# Patient Record
Sex: Male | Born: 1970 | Race: White | Hispanic: No | Marital: Married | Smoking: Former smoker
Health system: Southern US, Community
[De-identification: ages and names within clinical notes are randomized; demographics above are authoritative.]

## PROBLEM LIST (undated history)

## (undated) DIAGNOSIS — F1010 Alcohol abuse, uncomplicated: Secondary | ICD-10-CM

## (undated) DIAGNOSIS — I351 Nonrheumatic aortic (valve) insufficiency: Secondary | ICD-10-CM

## (undated) DIAGNOSIS — I5020 Unspecified systolic (congestive) heart failure: Secondary | ICD-10-CM

## (undated) DIAGNOSIS — Z9889 Other specified postprocedural states: Secondary | ICD-10-CM

## (undated) DIAGNOSIS — I4892 Unspecified atrial flutter: Secondary | ICD-10-CM

## (undated) DIAGNOSIS — R011 Cardiac murmur, unspecified: Secondary | ICD-10-CM

## (undated) DIAGNOSIS — I428 Other cardiomyopathies: Secondary | ICD-10-CM

## (undated) DIAGNOSIS — I499 Cardiac arrhythmia, unspecified: Secondary | ICD-10-CM

## (undated) DIAGNOSIS — I480 Paroxysmal atrial fibrillation: Secondary | ICD-10-CM

## (undated) DIAGNOSIS — Z8679 Personal history of other diseases of the circulatory system: Secondary | ICD-10-CM

## (undated) DIAGNOSIS — Z954 Presence of other heart-valve replacement: Secondary | ICD-10-CM

## (undated) HISTORY — PX: CARDIAC CATHETERIZATION: SHX172

## (undated) HISTORY — PX: NO PAST SURGERIES: SHX2092

---

## 2013-07-21 HISTORY — PX: AORTIC VALVE REPLACEMENT: SHX41

## 2013-07-28 ENCOUNTER — Encounter (HOSPITAL_COMMUNITY): Payer: Self-pay | Admitting: Emergency Medicine

## 2013-07-28 ENCOUNTER — Emergency Department (HOSPITAL_COMMUNITY): Payer: Self-pay

## 2013-07-28 ENCOUNTER — Inpatient Hospital Stay (HOSPITAL_COMMUNITY)
Admission: EM | Admit: 2013-07-28 | Discharge: 2013-08-01 | DRG: 291 | Disposition: A | Discharge status: Home / Self Care | Payer: Self-pay | Attending: Family Medicine | Admitting: Family Medicine

## 2013-07-28 DIAGNOSIS — I509 Heart failure, unspecified: Secondary | ICD-10-CM | POA: Diagnosis present

## 2013-07-28 DIAGNOSIS — R0989 Other specified symptoms and signs involving the circulatory and respiratory systems: Secondary | ICD-10-CM

## 2013-07-28 DIAGNOSIS — Z8249 Family history of ischemic heart disease and other diseases of the circulatory system: Secondary | ICD-10-CM

## 2013-07-28 DIAGNOSIS — I351 Nonrheumatic aortic (valve) insufficiency: Secondary | ICD-10-CM

## 2013-07-28 DIAGNOSIS — R599 Enlarged lymph nodes, unspecified: Secondary | ICD-10-CM | POA: Diagnosis present

## 2013-07-28 DIAGNOSIS — Z87891 Personal history of nicotine dependence: Secondary | ICD-10-CM

## 2013-07-28 DIAGNOSIS — I059 Rheumatic mitral valve disease, unspecified: Secondary | ICD-10-CM

## 2013-07-28 DIAGNOSIS — I5021 Acute systolic (congestive) heart failure: Principal | ICD-10-CM | POA: Diagnosis present

## 2013-07-28 DIAGNOSIS — I712 Thoracic aortic aneurysm, without rupture, unspecified: Secondary | ICD-10-CM | POA: Diagnosis present

## 2013-07-28 DIAGNOSIS — I426 Alcoholic cardiomyopathy: Secondary | ICD-10-CM | POA: Diagnosis present

## 2013-07-28 DIAGNOSIS — R000 Tachycardia, unspecified: Secondary | ICD-10-CM | POA: Diagnosis present

## 2013-07-28 DIAGNOSIS — I10 Essential (primary) hypertension: Secondary | ICD-10-CM | POA: Diagnosis present

## 2013-07-28 DIAGNOSIS — F1020 Alcohol dependence, uncomplicated: Secondary | ICD-10-CM | POA: Diagnosis present

## 2013-07-28 DIAGNOSIS — R0600 Dyspnea, unspecified: Secondary | ICD-10-CM

## 2013-07-28 DIAGNOSIS — I4892 Unspecified atrial flutter: Secondary | ICD-10-CM | POA: Diagnosis present

## 2013-07-28 DIAGNOSIS — Q231 Congenital insufficiency of aortic valve: Secondary | ICD-10-CM

## 2013-07-28 DIAGNOSIS — R0609 Other forms of dyspnea: Secondary | ICD-10-CM

## 2013-07-28 DIAGNOSIS — I4891 Unspecified atrial fibrillation: Secondary | ICD-10-CM | POA: Diagnosis not present

## 2013-07-28 DIAGNOSIS — J9600 Acute respiratory failure, unspecified whether with hypoxia or hypercapnia: Secondary | ICD-10-CM | POA: Diagnosis present

## 2013-07-28 HISTORY — DX: Alcohol abuse, uncomplicated: F10.10

## 2013-07-28 HISTORY — DX: Nonrheumatic aortic (valve) insufficiency: I35.1

## 2013-07-28 HISTORY — DX: Unspecified atrial flutter: I48.92

## 2013-07-28 HISTORY — DX: Unspecified systolic (congestive) heart failure: I50.20

## 2013-07-28 HISTORY — DX: Paroxysmal atrial fibrillation: I48.0

## 2013-07-28 LAB — CBC WITH DIFFERENTIAL/PLATELET
Basophils Absolute: 0 10*3/uL (ref 0.0–0.1)
Basophils Relative: 0 % (ref 0–1)
Eosinophils Absolute: 0.1 10*3/uL (ref 0.0–0.7)
Eosinophils Relative: 1 % (ref 0–5)
HCT: 40.1 % (ref 39.0–52.0)
Hemoglobin: 13.3 g/dL (ref 13.0–17.0)
Lymphocytes Relative: 11 % — ABNORMAL LOW (ref 12–46)
Lymphs Abs: 0.9 10*3/uL (ref 0.7–4.0)
MCH: 30.6 pg (ref 26.0–34.0)
MCHC: 33.2 g/dL (ref 30.0–36.0)
MCV: 92.2 fL (ref 78.0–100.0)
Monocytes Absolute: 0.6 10*3/uL (ref 0.1–1.0)
Monocytes Relative: 7 % (ref 3–12)
Neutro Abs: 6.8 10*3/uL (ref 1.7–7.7)
Neutrophils Relative %: 81 % — ABNORMAL HIGH (ref 43–77)
Platelets: 193 10*3/uL (ref 150–400)
RBC: 4.35 MIL/uL (ref 4.22–5.81)
RDW: 13.7 % (ref 11.5–15.5)
WBC: 8.4 10*3/uL (ref 4.0–10.5)

## 2013-07-28 LAB — MAGNESIUM: Magnesium: 2 mg/dL (ref 1.5–2.5)

## 2013-07-28 LAB — RAPID URINE DRUG SCREEN, HOSP PERFORMED
Amphetamines: NOT DETECTED
Barbiturates: NOT DETECTED
Benzodiazepines: NOT DETECTED
Cocaine: NOT DETECTED
Opiates: NOT DETECTED
Tetrahydrocannabinol: NOT DETECTED

## 2013-07-28 LAB — HEPATIC FUNCTION PANEL
ALT: 62 U/L — ABNORMAL HIGH (ref 0–53)
AST: 51 U/L — ABNORMAL HIGH (ref 0–37)
Albumin: 4.1 g/dL (ref 3.5–5.2)
Alkaline Phosphatase: 64 U/L (ref 39–117)
Bilirubin, Direct: 0.3 mg/dL (ref 0.0–0.3)
Indirect Bilirubin: 1.2 mg/dL — ABNORMAL HIGH (ref 0.3–0.9)
Total Bilirubin: 1.5 mg/dL — ABNORMAL HIGH (ref 0.3–1.2)
Total Protein: 6.6 g/dL (ref 6.0–8.3)

## 2013-07-28 LAB — BASIC METABOLIC PANEL
BUN: 17 mg/dL (ref 6–23)
CO2: 24 mEq/L (ref 19–32)
Calcium: 9.3 mg/dL (ref 8.4–10.5)
Chloride: 100 mEq/L (ref 96–112)
Creatinine, Ser: 1.07 mg/dL (ref 0.50–1.35)
GFR calc Af Amer: >90 mL/min (ref >90)
GFR calc non Af Amer: 85 mL/min — ABNORMAL LOW (ref >90)
Glucose, Bld: 124 mg/dL — ABNORMAL HIGH (ref 70–99)
Potassium: 5 mEq/L (ref 3.7–5.3)
Sodium: 138 mEq/L (ref 137–147)

## 2013-07-28 LAB — ETHANOL: Alcohol, Ethyl (B): <11 mg/dL (ref 0–11)

## 2013-07-28 LAB — TROPONIN I
Troponin I: <0.3 ng/mL (ref <0.30)
Troponin I: <0.3 ng/mL (ref <0.30)

## 2013-07-28 LAB — PRO B NATRIURETIC PEPTIDE: Pro B Natriuretic peptide (BNP): 4883 pg/mL — ABNORMAL HIGH (ref 0–125)

## 2013-07-28 MED ORDER — DILTIAZEM HCL 100 MG IV SOLR
5.0000 mg/h | INTRAVENOUS | Status: DC
  Administered 2013-07-28: 5 mg/h via INTRAVENOUS
  Filled 2013-07-28: qty 100

## 2013-07-28 MED ORDER — ENOXAPARIN SODIUM 40 MG/0.4ML ~~LOC~~ SOLN
40.0000 mg | SUBCUTANEOUS | Status: DC
  Administered 2013-07-28: 40 mg via SUBCUTANEOUS
  Filled 2013-07-28: qty 0.4

## 2013-07-28 MED ORDER — ESMOLOL HCL-SODIUM CHLORIDE 2000 MG/100ML IV SOLN: 25.0000 ug/kg/min | INTRAVENOUS | Status: DC

## 2013-07-28 MED ORDER — FUROSEMIDE 10 MG/ML IJ SOLN
40.0000 mg | Freq: Two times a day (BID) | INTRAMUSCULAR | Status: DC
  Administered 2013-07-28: 40 mg via INTRAVENOUS
  Filled 2013-07-28 (×3): qty 4

## 2013-07-28 MED ORDER — LORAZEPAM 2 MG/ML IJ SOLN
1.0000 mg | Freq: Once | INTRAMUSCULAR | Status: AC
  Administered 2013-07-28: 1 mg via INTRAVENOUS
  Filled 2013-07-28: qty 1

## 2013-07-28 MED ORDER — ADENOSINE 6 MG/2ML IV SOLN
6.0000 mg | Freq: Once | INTRAVENOUS | Status: AC
  Administered 2013-07-28: 6 mg via INTRAVENOUS
  Filled 2013-07-28: qty 2

## 2013-07-28 MED ORDER — HEPARIN (PORCINE) IN NACL 100-0.45 UNIT/ML-% IJ SOLN
700.0000 [IU]/h | INTRAMUSCULAR | Status: DC
  Administered 2013-07-28: 700 [IU]/h via INTRAVENOUS
  Filled 2013-07-28: qty 250

## 2013-07-28 MED ORDER — SODIUM CHLORIDE 0.9 % IV BOLUS (SEPSIS)
1000.0000 mL | Freq: Once | INTRAVENOUS | Status: AC
  Administered 2013-07-28: 1000 mL via INTRAVENOUS

## 2013-07-28 MED ORDER — SODIUM CHLORIDE 0.9 % IV SOLN: 250.0000 mL | INTRAVENOUS | Status: DC | PRN

## 2013-07-28 MED ORDER — HYDROCODONE-ACETAMINOPHEN 5-325 MG PO TABS: 1.0000 | ORAL_TABLET | ORAL | Status: DC | PRN

## 2013-07-28 MED ORDER — ONDANSETRON HCL 4 MG/2ML IJ SOLN: 4.0000 mg | Freq: Four times a day (QID) | INTRAMUSCULAR | Status: DC | PRN

## 2013-07-28 MED ORDER — SODIUM CHLORIDE 0.9 % IJ SOLN: 3.0000 mL | INTRAMUSCULAR | Status: DC | PRN

## 2013-07-28 MED ORDER — FUROSEMIDE 10 MG/ML IJ SOLN
40.0000 mg | Freq: Once | INTRAMUSCULAR | Status: AC
  Administered 2013-07-28: 40 mg via INTRAVENOUS

## 2013-07-28 MED ORDER — ADENOSINE 6 MG/2ML IV SOLN
INTRAVENOUS | Status: AC
  Administered 2013-07-28: 12 mg
  Filled 2013-07-28: qty 4

## 2013-07-28 MED ORDER — FUROSEMIDE 10 MG/ML IJ SOLN
40.0000 mg | Freq: Every day | INTRAMUSCULAR | Status: DC
  Filled 2013-07-28 (×2): qty 4

## 2013-07-28 MED ORDER — SODIUM CHLORIDE 0.9 % IJ SOLN
3.0000 mL | Freq: Two times a day (BID) | INTRAMUSCULAR | Status: DC
  Administered 2013-07-28 – 2013-08-01 (×3): 3 mL via INTRAVENOUS

## 2013-07-28 MED ORDER — METOPROLOL TARTRATE 1 MG/ML IV SOLN
5.0000 mg | Freq: Once | INTRAVENOUS | Status: AC
  Administered 2013-07-28: 5 mg via INTRAVENOUS

## 2013-07-28 MED ORDER — METOPROLOL TARTRATE 1 MG/ML IV SOLN
INTRAVENOUS | Status: AC
  Filled 2013-07-28: qty 5

## 2013-07-28 MED ORDER — DIAZEPAM 5 MG/ML IJ SOLN
5.0000 mg | Freq: Once | INTRAMUSCULAR | Status: AC
  Administered 2013-07-28: 5 mg via INTRAVENOUS
  Filled 2013-07-28: qty 2

## 2013-07-28 MED ORDER — ONDANSETRON HCL 4 MG PO TABS: 4.0000 mg | ORAL_TABLET | Freq: Four times a day (QID) | ORAL | Status: DC | PRN

## 2013-07-28 MED ORDER — FUROSEMIDE 10 MG/ML IJ SOLN
INTRAMUSCULAR | Status: AC
  Filled 2013-07-28: qty 4

## 2013-07-28 MED ORDER — SODIUM CHLORIDE 0.9 % IJ SOLN
3.0000 mL | Freq: Two times a day (BID) | INTRAMUSCULAR | Status: DC
  Administered 2013-07-28 – 2013-08-01 (×6): 3 mL via INTRAVENOUS

## 2013-07-28 MED ORDER — IOHEXOL 350 MG/ML SOLN
100.0000 mL | Freq: Once | INTRAVENOUS | Status: AC | PRN
  Administered 2013-07-28: 100 mL via INTRAVENOUS

## 2013-07-28 NOTE — ED Notes (Signed)
Patient states he has been having abdominal bloating x 3 weeks. Patient states he has eliminated alcohol and dairy from his diet. Patient states he has SOB when bloating occurs. Patient went to UC today because he felt the heart palpitations and felt pressure in his abdomen. Patient's HR was in the 140's and was told to come to the ED for further evaluation.

## 2013-07-28 NOTE — Progress Notes (Signed)
[facility] provided pt with a list of primary care resources and ACA information.  °

## 2013-07-28 NOTE — ED Notes (Addendum)
Pt reports abd bloating progressively worse over the past year. Pt reports SOB with abd bloating. Pt denies cp. Pt reports taking generic over the counter bronchodilator last night and noticed heart racing after taking it. Pt reports last BM yesterday and unable to pass gas today. Pt reports cough with yellow sputum over past day.

## 2013-07-28 NOTE — Progress Notes (Signed)
ANTICOAGULATION CONSULT NOTE - Initial Consult  Pharmacy Consult for Heparin Indication: Atrial Flutter  No Known Allergies  Patient Measurements: Height: 5\' 8"  (172.7 cm) Weight: 155 lb (70.308 kg) IBW/kg (Calculated) : 68.4 Heparin Dosing Weight: 70.3kg  Vital Signs: Temp: 98.6 F (37 C) (01/08 1952) Temp src: Oral (01/08 1952) BP: 132/74 mmHg (01/08 1952) Pulse Rate: 139 (01/08 1952)  Labs:  Recent Labs  07/28/13 1509 07/28/13 2100  HGB 13.3  --   HCT 40.1  --   PLT 193  --   CREATININE 1.07  --   TROPONINI <0.30 <0.30    Estimated Creatinine Clearance: 88.8 ml/min (by C-G formula based on Cr of 1.07).   Medical History: Past Medical History  Diagnosis Date  . Medical history non-contributory     Assessment: 35 yoM with new diagnosis of CHF and atrial flutter.  Pt received px dose LMWH 40mg  SQ x1 at 2100 and then was started on IV heparin at 700 units/hr by cardiology.  On-call cardiologist ok'd pharmacy to begin consult for heparin.    Heparin dosing weight = 70.3kg.   CBC: No issues noted, plts WNL.   Renal: SCr 1.07, CrCl 89 ml/min Baseline aPPT, PT/INR ordered  Goal of Therapy:  Heparin level 0.3-0.7 units/ml Monitor platelets by anticoagulation protocol: Yes   Plan:  1.  Continue IV heparin at 700 units/hr (10 units/kg/hr).  No bolus, will leave at current heparin rate due to proximity of lovenox dose. F/u 6 hours HL.   Ralene Bathe, PharmD, BCPS 07/28/2013, 10:13 PM  Pager: [phone]

## 2013-07-28 NOTE — ED Provider Notes (Signed)
CSN: [id number]     Arrival date & time 07/28/13  1238 History   First MD Initiated Contact with Patient 07/28/13 1450     Chief Complaint  Patient presents with  . Tachycardia  . Shortness of Breath   (Consider location/radiation/quality/duration/timing/severity/associated sxs/prior Treatment) HPI  42yM with palpitations and abdominal bloating. Progressive symptoms over past few weeks, but significantly worse since Sunday. Vague pressure in abdomen, chest and into neck. Initially intermittent but has been more or less constant since last night. Symptoms worse when laying flat. Feels better sitting up. SOB with exertion. Feels ok at rest. No fever or chills. No cough. No unusual leg pain or swelling. No recent surgery, prolonged immobolization or hx of PE/DVT. No easy bruising, BRBPR or melena.   History reviewed. No pertinent past medical history. History reviewed. No pertinent past surgical history. Family History  Problem Relation Age of Onset  . Heart failure Father    History  Substance Use Topics  . Smoking status: Former Research scientist (life sciences)  . Smokeless tobacco: Never Used  . Alcohol Use: Yes    Review of Systems  All systems reviewed and negative, other than as noted in HPI.   Allergies  Review of patient's allergies indicates no known allergies.  Home Medications   Current Outpatient Rx  Name  Route  Sig  Dispense  Refill  . OVER THE COUNTER MEDICATION   Oral   Take 1 tablet by mouth daily. Over the counter bronchiodilator to help with asthma          BP 132/89  Pulse 134  Temp(Src) 98.3 F (36.8 C) (Oral)  Resp 31  Ht 5\' 8"  (1.727 m)  Wt 155 lb (70.308 kg)  BMI 23.57 kg/m2  SpO2 96% Physical Exam  Nursing note and vitals reviewed. Constitutional: He appears well-developed and well-nourished. No distress.  HENT:  Head: Normocephalic and atraumatic.  Eyes: Conjunctivae are normal. Right eye exhibits no discharge. Left eye exhibits no discharge.  Neck: Neck  supple.  Cardiovascular: Regular rhythm and normal heart sounds.  Exam reveals no gallop and no friction rub.   No murmur heard. tachycardic  Pulmonary/Chest: Breath sounds normal. No respiratory distress.  Tachypnea. Lungs clear.   Abdominal: Soft. He exhibits no distension. There is no tenderness.  Musculoskeletal: He exhibits no edema and no tenderness.  No peripheral edema  Neurological: He is alert.  Skin: Skin is warm and dry.  Psychiatric: He has a normal mood and affect. His behavior is normal. Thought content normal.    ED Course  Procedures (including critical care time) Labs Review Labs Reviewed  CBC WITH DIFFERENTIAL - Abnormal; Notable for the following:    Neutrophils Relative % 81 (*)    Lymphocytes Relative 11 (*)    All other components within normal limits  BASIC METABOLIC PANEL - Abnormal; Notable for the following:    Glucose, Bld 124 (*)    GFR calc non Af Amer 85 (*)    All other components within normal limits  HEPATIC FUNCTION PANEL - Abnormal; Notable for the following:    AST 51 (*)    ALT 62 (*)    Total Bilirubin 1.5 (*)    Indirect Bilirubin 1.2 (*)    All other components within normal limits  PRO B NATRIURETIC PEPTIDE - Abnormal; Notable for the following:    Pro B Natriuretic peptide (BNP) 4883.0 (*)    All other components within normal limits  TROPONIN I  MAGNESIUM  TSH  CBC  WITH DIFFERENTIAL  URINE RAPID DRUG SCREEN (HOSP PERFORMED)   Imaging Review Dg Chest Portable 1 View  07/28/2013   CLINICAL DATA:  Dyspnea and tachycardia  EXAM: PORTABLE CHEST - 1 VIEW  COMPARISON:  None.  FINDINGS: The cardiopericardial silhouette is enlarged. There is mild prominence of these pulmonary vascularity with mildly increased interstitial markings bilaterally. There is no pleural effusion. There is no alveolar infiltrate. The mediastinum is normal in width. The observed portions of the bony thorax exhibit no acute abnormalities.  IMPRESSION: Enlargement of  the cardiac silhouette which may reflect underlying chamber dilation or hypertrophy but a pericardial effusion is not excluded. Prominence of the pulmonary vascularity and pulmonary interstitium suggests sign rib mild CHF.   Electronically Signed   By: David  Martinique   On: 07/28/2013 15:45    EKG Interpretation    Date/Time:  Thursday July 28 2013 14:45:49 EST Ventricular Rate:  141 PR Interval:    QRS Duration: 114 QT Interval:  328 QTC Calculation: 502 R Axis:   -61 Text Interpretation:  Sinus tachycardia Abnormal R-wave progression, late transition Left ventricular hypertrophy Nonspecific T abnormalities Prolonged QT interval Baseline wander in lead(s) V2 Confirmed by Peyton Spengler  MD, Braidan Ricciardi ([id number]) on 07/28/2013 5:20:27 PM            MDM   1. Dyspnea   2. Acute heart failure     4:26 PM Pt significantly tachycardic on arrival. Suspected sinus on initial EKG but no variation in HR at all. Abnormal repol which suspect 2/2 LVH.  Then given small dose metoprolol with mild slowing and more convincing that sinus.On further discussion became apparent that pt initially downplaying how much alcohol he drank. Began speaking about specific quantities (4 oz, 8oz, etc) he consumed which is red flag. 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 Recently has been cutting back. May be etoh withdrawal. Additional benzos given. LFTs added. CXR with marked cardiomegaly. Hypertension likely chronic issue and not just from withdrawal. Supported by LVH on EKG.  Bedside US w/o evidence of significant pericardial effusion.  Trop normal with constant symptoms since last night and generally atypical symptoms for ACS. Will continue eval for alternative pathology such as PE. CT angio pending. TSH pending.  No psychosis, seizure or other autonomic symptoms such as tremor or diaphoresis. If further w/u remains unrevealing and HR remains elevated to this degree though, will admit for further  management.   CT w/o evidence of PE. 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 Unfortunately given bolus of NS initially. Saline lock. Lasix. Renal function good. Hopefully brisk response to diuretic. Needs further evaluation and management beyond scope of ED.   5:37 PM  Discussed with cardiology. Will see in consultation.      Virgel Manifold, MD 07/28/13 [phone]

## 2013-07-28 NOTE — H&P (Signed)
[facility] History and Physical  [name] Hinton XVQ:[id number] DOB: 04-Oct-1970 DOA: 07/28/2013  Referring physician: ED physician PCP: No primary provider on file.   Chief Complaint: shortness of breath   HPI:  Pt is 43 yo male with history of alcohol abuse who presents to [facility] ED with main concern of progressively worsening abdominal bloating, associated with exertional shortness of breath, radiating to chest area and into the neck, started several days PTA, worse with lying down flat and better while sitting up. Pt denies fevers, chills, leg swelling, no urinary concerns, no similar events in the past. In ED, pt hemodynamically stable, imaging studies worrisome for new onset heart failure of unclear etiology. Cardiology consultation requested and [facility] asked to admit for further evaluation , telemetry bed requested.   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  HENT: Negative for hearing loss, ear pain, nosebleeds, congestion, sore throat, neck pain, tinnitus and ear discharge.   Eyes: Negative for blurred vision, double vision, photophobia, pain, discharge and redness.  Respiratory: Negative for wheezing and stridor.    Cardiovascular: Negative for chest pain, and leg swelling.  Gastrointestinal: Negative for heartburn, constipation, blood in stool and melena.  Genitourinary: Negative for dysuria, urgency, frequency, hematuria and flank pain.  Musculoskeletal: Negative for myalgias, back pain, joint pain and falls.  Skin: Negative for itching and rash.  Neurological: Negative for tingling, tremors, sensory change, speech change, focal weakness, loss of consciousness and headaches.  Endo/Heme/Allergies: Negative for environmental allergies and polydipsia. Does not bruise/bleed easily.  Psychiatric/Behavioral: Negative for suicidal ideas. The patient is not nervous/anxious.      Past Medical History  Diagnosis Date  . Medical history non-contributory     Past Surgical History  Procedure Laterality Date  . No past surgeries      Social History:  reports that he quit smoking about 3 years ago. He has never used smokeless tobacco. He reports that he drinks alcohol. He reports that he does not use illicit drugs.  No Known Allergies  Family History  Problem Relation Age of Onset  . Heart failure Father     Prior to Admission medications   Medication Sig Start Date End Date Taking? Authorizing Provider  OVER THE COUNTER MEDICATION Take 1 tablet by mouth daily. Over the counter bronchiodilator to help with asthma   Yes Historical Provider, MD    Physical Exam: Filed Vitals:   07/28/13 1700 07/28/13 1715 07/28/13 1730 07/28/13 1745  BP: 129/83 127/84 127/85 133/102  Pulse: 135 133 132 129  Temp:      TempSrc:      Resp: 38 21 25 27   Height:      Weight:      SpO2: 95% 96% 96% 98%  Physical Exam  Constitutional: Appears well-developed and well-nourished. No distress.  HENT: Normocephalic. External right and left ear normal. Oropharynx is clear and moist.  Eyes: Conjunctivae and EOM are normal. PERRLA, no scleral icterus.  Neck: Normal ROM. Neck supple. No JVD. No tracheal deviation. No  thyromegaly.  CVS: RRR, S1/S2 +, no murmurs, no gallops, no carotid bruit.  Pulmonary: Effort and breath sounds normal, mild bibasilar crackles  Abdominal: Soft. BS +,  no distension, tenderness, rebound or guarding.  Musculoskeletal: Normal range of motion. No edema and no tenderness.  Lymphadenopathy: No lymphadenopathy noted, cervical, inguinal. Neuro: Alert. Normal reflexes, muscle tone coordination. No cranial nerve deficit. Skin: Skin is warm and dry. No rash noted. Not diaphoretic. No erythema. No pallor.  Psychiatric: Normal mood and affect. Behavior, judgment, thought content normal.   Labs on Admission:  Basic Metabolic Panel:  Recent Labs Lab 07/28/13 1509  NA 138  K 5.0  CL 100  CO2 24  GLUCOSE 124*  BUN 17  CREATININE 1.07  CALCIUM 9.3  MG 2.0   Liver Function Tests:  Recent Labs Lab 07/28/13 1509  AST 51*  ALT 62*  ALKPHOS 64  BILITOT 1.5*  PROT 6.6  ALBUMIN 4.1   CBC:  Recent Labs Lab 07/28/13 1509  WBC 8.4  NEUTROABS 6.8  HGB 13.3  HCT 40.1  MCV 92.2  PLT 193   Cardiac Enzymes:  Recent Labs Lab 07/28/13 1509  TROPONINI <0.30   Radiological Exams on Admission: Ct Angio Chest W/cm &/or Wo Cm   07/28/2013    1. No acute pulmonary embolism. 2. Left ventricular hypertrophy advanced for age. Recommend cardiology consultation for potential heart failure in this young patient.  3. Interlobular septal thickening consistent with interstitial edema.  4. Mild paratracheal adenopathy is likely reactive.  5. 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 Recommend non emergent cardiothoracic surgery consultation.    Dg Chest Portable 1 View 07/28/2013  Enlargement of the cardiac silhouette which may reflect underlying chamber dilation or hypertrophy but a pericardial effusion is not excluded. Prominence of the pulmonary vascularity and pulmonary interstitium suggests sign rib mild CHF.    EKG: Normal sinus rhythm, no ST/T wave  changes  Faye Ramsay, MD  [facility] Pager [phone]  If 7PM-7AM, please contact night-coverage [email or web address] Password [facility] 07/28/2013, 6:42 PM

## 2013-07-28 NOTE — Consult Note (Addendum)
CARDIOLOGY CONSULT NOTE   Patient ID: [name] Hinton MRN: [id number], DOB/AGE: [birth year]   Admit date: 07/28/2013 Date of Consult: 07/28/2013  Primary Physician: No primary provider on file. Primary Cardiologist: Ena Dawley, H  Pt. Profile  New onset CHF  Problem List  Past Medical History  Diagnosis Date  . Medical history non-contributory     Past Surgical History  Procedure Laterality Date  . No past surgeries       Allergies  No Known Allergies  HPI   A 43 year old male with no prior medical history who presented with shortness of breath and abdominal fullness. The patient states that he has noticed changes over the last 2 years. He exercises in the gym 3-4 times per week and he was getting progressively more short of breath. In the last couple of days he experienced dyspnea on exertion associated with chest pressure, orthopnea and paroxysmal nocturnal dyspnea as well. No LE edema, or palpitations. He has h/o 20 years of heavy smoking, quit 4 years ago. He works as a Chief Operating Officer  And admits to drinking 25 drinks/week for the last 20 years.  He has FH of CAD, his father and grandfather both underwent CABG in their 38'.  In the ER he was found to be tachycardic, with persistent HR 140 BPM. CXR shows cardiomegaly, and mild congestion.   Inpatient Medications   Family History Family History  Problem Relation Age of Onset  . Heart failure Father      Social History History   Social History  . Marital Status: Single    Spouse Name: N/A    Number of Children: N/A  . Years of Education: N/A   Occupational History  . Not on file.   Social History Main Topics  . Smoking status: Former Smoker -- 1.50 packs/day for 20 years    Quit date: 09/25/2009  . Smokeless tobacco: Never Used  . Alcohol Use: Yes     Comment: 25 drinks/week - occasional in 2 months  . Drug Use: No  . Sexual Activity: Not on file   Other Topics Concern  . Not on file   Social History  Narrative  . No narrative on file     Review of Systems  General:  No chills, fever, night sweats or weight changes.  Cardiovascular:  No chest pain, dyspnea on exertion, edema, orthopnea, palpitations, paroxysmal nocturnal dyspnea. Dermatological: No rash, lesions/masses Respiratory: No cough, dyspnea Urologic: No hematuria, dysuria Abdominal:   No nausea, vomiting, diarrhea, bright red blood per rectum, melena, or hematemesis Neurologic:  No visual changes, wkns, changes in mental status. All other systems reviewed and are otherwise negative except as noted above.  Physical Exam  Blood pressure 141/91, pulse 136, temperature 98.3 F (36.8 C), temperature source Oral, resp. rate 30, height 5\' 8"  (1.727 m), weight 155 lb (70.308 kg), SpO2 99.00%.  General: Pleasant, NAD Psych: Normal affect. Neuro: Alert and oriented X 3. Moves all extremities spontaneously. HEENT: Normal  Neck: Supple without bruits, JVD up to the jaws B/L Lungs:  Resp regular and unlabored, mild crackles at the bases. Heart: RRR no s3, s4, or murmurs. Abdomen: Soft, non-tender, mildly distended, BS + x 4.  Extremities: No clubbing, cyanosis or edema. DP/PT/Radials 2+ and equal bilaterally.  Labs   Recent Labs  07/28/13 1509  TROPONINI <0.30   Lab Results  Component Value Date   WBC 8.4 07/28/2013   HGB 13.3 07/28/2013   HCT 40.1 07/28/2013   MCV  92.2 07/28/2013   PLT 193 07/28/2013    Recent Labs Lab 07/28/13 1509  NA 138  K 5.0  CL 100  CO2 24  BUN 17  CREATININE 1.07  CALCIUM 9.3  PROT 6.6  BILITOT 1.5*  ALKPHOS 64  ALT 62*  AST 51*  GLUCOSE 124*   Radiology/Studies  Ct Angio Chest W/cm &/or Wo Cm  07/28/2013   CLINICAL DATA:  Short of breath, tachycardia  IMPRESSION: 1. No acute pulmonary embolism. 2. Left ventricular hypertrophy advanced for age. Recommend cardiology consultation for potential heart failure in this young patient. 3. Interlobular septal thickening consistent with  interstitial edema. 4. Mild paratracheal adenopathy is likely reactive. 5. 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 Recommend non emergent cardiothoracic surgery consultation.     Dg Chest Portable 1 View  07/28/2013    IMPRESSION: Enlargement of the cardiac silhouette which may reflect underlying chamber dilation or hypertrophy but a pericardial effusion is not excluded. Prominence of the pulmonary vascularity and pulmonary interstitium suggests sign rib mild CHF.     Echocardiogram - none  ECG: Atrial flutter with 2:1 block HR 140/minute    ASSESSMENT AND PLAN  A 43 year old male   1. New diagnosis of congestive heart failure of unknown etiology - most probably alcohol induced or tachycardia induced, we will start treating him with aggressive diuresis, he has normal kidney function. We will order echocardiogram.  He will require ischemic work up once he recovers from an acute CHF  2. Atrial flutter - we will start cardizem and heparin drip, if no cardioversion overnight, we would consider a TEE/cardioversion in the am.   3. Alcohol dependence - possible withdrawal - add benzodiapenes   4. Hypertension - significant LVH on ECG, we will start with lasix for now, the goal will be to add BB and ACEI for his CHF prior to the discharge  5. Lipids - never checked, we will draw    Signed, Dorothy Spark, MD, [facility] 07/28/2013, 7:49 PM

## 2013-07-28 NOTE — ED Notes (Signed)
Pt had BM. Pt reports BM small. Pt reports still distended but pain free.

## 2013-07-29 DIAGNOSIS — I059 Rheumatic mitral valve disease, unspecified: Secondary | ICD-10-CM

## 2013-07-29 DIAGNOSIS — I509 Heart failure, unspecified: Secondary | ICD-10-CM

## 2013-07-29 DIAGNOSIS — I4892 Unspecified atrial flutter: Secondary | ICD-10-CM

## 2013-07-29 LAB — CBC
HCT: 38.6 % — ABNORMAL LOW (ref 39.0–52.0)
Hemoglobin: 13.1 g/dL (ref 13.0–17.0)
MCH: 31.1 pg (ref 26.0–34.0)
MCHC: 33.9 g/dL (ref 30.0–36.0)
MCV: 91.7 fL (ref 78.0–100.0)
Platelets: 205 10*3/uL (ref 150–400)
RBC: 4.21 MIL/uL — ABNORMAL LOW (ref 4.22–5.81)
RDW: 13.8 % (ref 11.5–15.5)
WBC: 7.3 10*3/uL (ref 4.0–10.5)

## 2013-07-29 LAB — LIPID PANEL
Cholesterol: 140 mg/dL (ref 0–200)
HDL: 54 mg/dL (ref >39)
LDL Cholesterol: 76 mg/dL (ref 0–99)
Total CHOL/HDL Ratio: 2.6 RATIO
Triglycerides: 48 mg/dL (ref <150)
VLDL: 10 mg/dL (ref 0–40)

## 2013-07-29 LAB — TROPONIN I
Troponin I: <0.3 ng/mL (ref <0.30)
Troponin I: <0.3 ng/mL (ref <0.30)

## 2013-07-29 LAB — BASIC METABOLIC PANEL
BUN: 14 mg/dL (ref 6–23)
CO2: 28 mEq/L (ref 19–32)
Calcium: 9.4 mg/dL (ref 8.4–10.5)
Chloride: 97 mEq/L (ref 96–112)
Creatinine, Ser: 1.26 mg/dL (ref 0.50–1.35)
GFR calc Af Amer: 81 mL/min — ABNORMAL LOW (ref >90)
GFR calc non Af Amer: 70 mL/min — ABNORMAL LOW (ref >90)
Glucose, Bld: 107 mg/dL — ABNORMAL HIGH (ref 70–99)
Potassium: 4.3 mEq/L (ref 3.7–5.3)
Sodium: 139 mEq/L (ref 137–147)

## 2013-07-29 LAB — APTT: aPTT: 32 seconds (ref 24–37)

## 2013-07-29 LAB — PROTIME-INR
INR: 1.3 (ref 0.00–1.49)
Prothrombin Time: 15.9 seconds — ABNORMAL HIGH (ref 11.6–15.2)

## 2013-07-29 LAB — HEPARIN LEVEL (UNFRACTIONATED): Heparin Unfractionated: 0.27 IU/mL — ABNORMAL LOW (ref 0.30–0.70)

## 2013-07-29 LAB — TSH: TSH: 1.214 u[IU]/mL (ref 0.350–4.500)

## 2013-07-29 MED ORDER — FUROSEMIDE 10 MG/ML IJ SOLN
40.0000 mg | Freq: Two times a day (BID) | INTRAMUSCULAR | Status: DC
  Administered 2013-07-29 – 2013-07-30 (×3): 40 mg via INTRAVENOUS
  Filled 2013-07-29 (×4): qty 4

## 2013-07-29 MED ORDER — METOPROLOL SUCCINATE ER 50 MG PO TB24
50.0000 mg | ORAL_TABLET | Freq: Every day | ORAL | Status: DC
  Administered 2013-07-29 – 2013-08-01 (×4): 50 mg via ORAL
  Filled 2013-07-29 (×4): qty 1

## 2013-07-29 MED ORDER — LISINOPRIL 2.5 MG PO TABS
2.5000 mg | ORAL_TABLET | Freq: Every day | ORAL | Status: DC
  Filled 2013-07-29: qty 1

## 2013-07-29 MED ORDER — RIVAROXABAN 20 MG PO TABS
20.0000 mg | ORAL_TABLET | Freq: Every day | ORAL | Status: DC
  Administered 2013-07-29 – 2013-08-01 (×4): 20 mg via ORAL
  Filled 2013-07-29 (×4): qty 1

## 2013-07-29 MED ORDER — METOPROLOL SUCCINATE ER 50 MG PO TB24
50.0000 mg | ORAL_TABLET | Freq: Every day | ORAL | Status: DC
  Filled 2013-07-29: qty 1

## 2013-07-29 MED ORDER — LISINOPRIL 2.5 MG PO TABS
2.5000 mg | ORAL_TABLET | Freq: Every day | ORAL | Status: DC
  Administered 2013-07-29 – 2013-07-31 (×3): 2.5 mg via ORAL
  Filled 2013-07-29 (×4): qty 1

## 2013-07-29 MED ORDER — HEPARIN (PORCINE) IN NACL 100-0.45 UNIT/ML-% IJ SOLN
800.0000 [IU]/h | INTRAMUSCULAR | Status: DC
  Filled 2013-07-29: qty 250

## 2013-07-29 NOTE — Progress Notes (Signed)
ANTICOAGULATION CONSULT NOTE - F/U Consult  Pharmacy Consult for Rivaroxaban Indication: Atrial Flutter  No Known Allergies  Patient Measurements: Height: 5\' 8"  (172.7 cm) Weight: 155 lb (70.308 kg) IBW/kg (Calculated) : 68.4  Vital Signs: Temp: 98.4 F (36.9 C) (01/09 0534) Temp src: Oral (01/09 0534) BP: 103/57 mmHg (01/09 0534) Pulse Rate: 70 (01/09 0534)  Labs:  Recent Labs  07/28/13 1509 07/28/13 2100 07/28/13 2310 07/29/13 0204 07/29/13 0800  HGB 13.3  --   --  13.1  --   HCT 40.1  --   --  38.6*  --   PLT 193  --   --  205  --   APTT  --   --  32  --   --   LABPROT  --   --  15.9*  --   --   INR  --   --  1.30  --   --   HEPARINUNFRC  --   --   --  0.27*  --   CREATININE 1.07  --   --  1.26  --   TROPONINI <0.30 <0.30  --  <0.30 <0.30    Estimated Creatinine Clearance: 75.4 ml/min (by C-G formula based on Cr of 1.26).   Assessment: 45 yoM with new diagnosis of CHF and atrial flutter.  Has been on IV heparin since last evening, now transitioning to oral rivaroxaban.  SCr slightly elevated 1.26, CrCl 75 ml/min  CBC stable, no bleeding/complications reported.  No drug interactions noted  Goal of Therapy:  Therapeutic anticoagulation Monitor platelets by anticoagulation protocol: Yes   Plan:   D/C heparin   Rivaroxaban 20mg  PO daily   Follow renal function, CBC, signs/symptoms of bleeding  Educated patient on rivaroxaban therapy, including indication, dosing, adverse effects, drug interactions and importance of compliance and MD follow up. He understand well.   Peggyann Juba, PharmD, BCPS Pager: [phone] 07/29/2013, 1:32 PM

## 2013-07-29 NOTE — Progress Notes (Signed)
  Echocardiogram 2D Echocardiogram has been performed.  [name] 07/29/2013, 2:48 PM

## 2013-07-29 NOTE — Progress Notes (Signed)
[facility] PROGRESS NOTE  [name] Hinton STM:[id number] DOB: 19-Sep-1970 DOA: 07/28/2013 PCP: No primary provider on file.  Assessment/Plan: 1. Atrial fibrillation - Cardiology on board and managing we'll defer treatment recommendations to them - Disposition per cardiology - Patient started on xarelto and currently on metoprolol  2. heart failure - Cardiology on board and managing we'll defer treatment recommendations to them - Disposition per cardiology  3. Transaminitis - Most likely due to alcohol use - No abdominal complaints - We'll plan on patient following up with his primary care physician as outpatient and we'll reassess next a.m.  4. 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 Hypertension - Currently on metoprolol and lisinopril.  Code Status: full Family Communication:  Discussed with pateint  Disposition Plan: We'll discuss with cardiology to see if they will take over service given primary problems   Consultants:  Cardiology  Procedures:  Echocardiogram  Antibiotics:  None  HPI/Subjective: Pt has no new complaints today. Feels better.  Objective: Filed Vitals:   07/29/13 1357  BP: 107/56  Pulse: 71  Temp: 97.6 F (36.4 C)  Resp: 24    Intake/Output Summary (Last 24 hours) at 07/29/13 1648 Last data filed at 07/29/13 1412  Gross per 24 hour  Intake    360 ml  Output    125 ml  Net    235 ml   Filed Weights   07/28/13 1401 07/28/13 1952 07/29/13 0534  Weight: 70.308 kg (155 lb) 70.308 kg (155 lb) 70.308 kg (155 lb)    Exam:   General:  Pt in NAD, Alert and awake  Cardiovascular: warm extremities, no cyanosis  Respiratory: No increased work of breathing, no wheezes  Abdomen: soft, NT, ND  Musculoskeletal: no cyanosis or clubbing   Data Reviewed: Basic Metabolic Panel:  Recent Labs Lab  07/28/13 1509 07/29/13 0204  NA 138 139  K 5.0 4.3  CL 100 97  CO2 24 28  GLUCOSE 124* 107*  BUN 17 14  CREATININE 1.07 1.26  CALCIUM 9.3 9.4  MG 2.0  --    Liver Function Tests:  Recent Labs Lab 07/28/13 1509  AST 51*  ALT 62*  ALKPHOS 64  BILITOT 1.5*  PROT 6.6  ALBUMIN 4.1   No results found for this basename: LIPASE, AMYLASE,  in the last 168 hours No results found for this basename: AMMONIA,  in the last 168 hours CBC:  Recent Labs Lab 07/28/13 1509 07/29/13 0204  WBC 8.4 7.3  NEUTROABS 6.8  --   HGB 13.3 13.1  HCT 40.1 38.6*  MCV 92.2 91.7  PLT 193 205   Cardiac Enzymes:  Recent Labs Lab 07/28/13 1509 07/28/13 2100 07/29/13 0204 07/29/13 0800  TROPONINI <0.30 <0.30 <0.30 <0.30   BNP (last 3 results)  Recent Labs  07/28/13 1653  PROBNP 4883.0*   CBG: No results found for this basename: GLUCAP,  in the last 168 hours  No results found for this or any previous visit (from the past 240 hour(s)).   Studies: Ct Angio Chest W/cm &/or Wo Cm  07/28/2013   CLINICAL DATA:  Short of breath, tachycardia  EXAM: CT ANGIOGRAPHY CHEST WITH CONTRAST  TECHNIQUE: Multidetector CT imaging of the chest was performed using the standard protocol during bolus administration of intravenous contrast. Multiplanar CT image reconstructions including MIPs were obtained to evaluate the vascular anatomy.  CONTRAST:  172mL  OMNIPAQUE IOHEXOL 350 MG/ML SOLN  COMPARISON:  DG CHEST 1V PORT dated 07/28/2013  FINDINGS: There are no filling defects within the pulmonary arteries to suggest acute pulmonary embolism. No acute findings of the aorta or great vessels. The ascending aorta is dilated to 5.1 cm (sagittal image 84). The left ventricle is hypertrophied. No pericardial fluid.  No axillary or supraclavicular lymphadenopathy. There is mild paratracheal lymphadenopathy lower paratracheal lymph node measuring up to 1.5 cm short axis. .  No overt pulmonary edema. There is interlobular  septal thickening consistent with interstitial edema. No pleural fluid.  Limited view of the upper abdomen is unremarkable. Limited view of the skeleton unremarkable.  Review of the MIP images confirms the above findings.  It  IMPRESSION: 1. No acute pulmonary embolism. 2. Left ventricular hypertrophy advanced for age. Recommend cardiology consultation for potential heart failure in this young patient. 3. Interlobular septal thickening consistent with interstitial edema. 4. Mild paratracheal adenopathy is likely reactive. 5. 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 Recommend non emergent cardiothoracic surgery consultation.   Electronically Signed   By: Suzy Bouchard M.D.   On: 07/28/2013 16:43   Dg Chest Portable 1 View  07/28/2013   CLINICAL DATA:  Dyspnea and tachycardia  EXAM: PORTABLE CHEST - 1 VIEW  COMPARISON:  None.  FINDINGS: The cardiopericardial silhouette is enlarged. There is mild prominence of these pulmonary vascularity with mildly increased interstitial markings bilaterally. There is no pleural effusion. There is no alveolar infiltrate. The mediastinum is normal in width. The observed portions of the bony thorax exhibit no acute abnormalities.  IMPRESSION: Enlargement of the cardiac silhouette which may reflect underlying chamber dilation or hypertrophy but a pericardial effusion is not excluded. Prominence of the pulmonary vascularity and pulmonary interstitium suggests sign rib mild CHF.   Electronically Signed   By: David  Martinique   On: 07/28/2013 15:45    Scheduled Meds: . furosemide  40 mg Intravenous BID  . lisinopril  2.5 mg Oral Daily  . metoprolol succinate  50 mg Oral Daily  . rivaroxaban  20 mg Oral Q supper  . sodium chloride  3 mL Intravenous Q12H  . sodium chloride  3 mL Intravenous Q12H   Continuous Infusions:   Active Problems:   CHF (congestive heart failure)    Time spent: > 35 minutes    Velvet Bathe  [facility] Pager [phone]. If  7PM-7AM, please contact night-coverage at [email or web address], password [facility] 07/29/2013, 4:48 PM  LOS: 1 day

## 2013-07-29 NOTE — Progress Notes (Signed)
ANTICOAGULATION CONSULT NOTE - F/U Consult  Pharmacy Consult for Heparin Indication: Atrial Flutter  No Known Allergies  Patient Measurements: Height: 5\' 8"  (172.7 cm) Weight: 155 lb (70.308 kg) IBW/kg (Calculated) : 68.4 Heparin Dosing Weight: 70.3kg  Vital Signs: Temp: 98.4 F (36.9 C) (01/09 0534) Temp src: Oral (01/09 0534) BP: 103/57 mmHg (01/09 0534) Pulse Rate: 70 (01/09 0534)  Labs:  Recent Labs  07/28/13 1509 07/28/13 2100 07/28/13 2310 07/29/13 0204  HGB 13.3  --   --  13.1  HCT 40.1  --   --  38.6*  PLT 193  --   --  205  APTT  --   --  32  --   LABPROT  --   --  15.9*  --   INR  --   --  1.30  --   HEPARINUNFRC  --   --   --  0.27*  CREATININE 1.07  --   --  1.26  TROPONINI <0.30 <0.30  --  <0.30    Estimated Creatinine Clearance: 75.4 ml/min (by C-G formula based on Cr of 1.26).   Medical History: Past Medical History  Diagnosis Date  . Medical history non-contributory     Assessment: 87 yoM with new diagnosis of CHF and atrial flutter.  Pt received px dose LMWH 40mg  SQ x1 at 2100 and then was started on IV heparin at 700 units/hr by cardiology.  On-call cardiologist ok'd pharmacy to begin consult for heparin.    Heparin dosing weight = 70.3kg.   CBC: No issues noted, plts WNL.   Renal: SCr 1.07, CrCl 89 ml/min Baseline aPPT, PT/INR ordered  1st HL = 0.27 units/ml drawn @ 0200??? (~ 3 hrs after started/ 5 hrs post Lovenox 40mg  x1)  No problems per RN  Goal of Therapy:  Heparin level 0.3-0.7 units/ml Monitor platelets by anticoagulation protocol: Yes   Plan:   Increase heparin drip to 800 units/hr  Recheck HL in 6 hrs  Daily HL/CBC  Dorrene German 07/29/2013, 6:05 AM

## 2013-07-29 NOTE — Discharge Instructions (Signed)
Information on my medicine - XARELTO (Rivaroxaban)  This medication education was reviewed with me or my healthcare representative as part of my discharge preparation.  The pharmacist that spoke with me during my hospital stay was:  Emiliano Dyer, RPH  Why was Xarelto prescribed for you? Xarelto was prescribed for you to reduce the risk of a blood clots forming after orthopedic surgery OR to reduce the risk of forming blood clots that cause a stroke if you have a medical condition called atrial fibrillation (a type of irregular heartbeat).  What do you need to know about xarelto ? Take your Xarelto ONCE DAILY at the same time every day with your evening meal. If you have difficulty swallowing the tablet whole, you may crush it and mix in applesauce just prior to taking your dose.  Take Xarelto exactly as prescribed by your doctor and DO NOT stop taking Xarelto without talking to the doctor who prescribed the medication.  Stopping without other stroke or VTE prevention medication to take the place of Xarelto may increase your risk of developing a new clot or stroke.  Refill your prescription before you run out.  After discharge, you should have regular check-up appointments with your healthcare provider that is prescribing your Xarelto.  In the future your dose may need to be changed if your kidney function or weight changes by a significant amount.  What do you do if you miss a dose? If you are taking Xarelto ONCE DAILY and you miss a dose, take it as soon as you remember on the same day then continue your regularly scheduled once daily regimen the next day. Do not take two doses of Xarelto at the same time.   Important Safety Information A possible side effect of Xarelto is bleeding. You should call your healthcare provider right away if you experience any of the following:   Bleeding from an injury or your nose that does not stop.   Unusual colored urine (red or dark brown) or  unusual colored stools (red or black).   Unusual bruising for unknown reasons.   A serious fall or if you hit your head (even if there is no bleeding).  Some medicines may interact with Xarelto and might increase your risk of bleeding while on Xarelto. To help avoid this, consult your healthcare provider or pharmacist prior to using any new prescription or non-prescription medications, including herbals, vitamins, non-steroidal anti-inflammatory drugs (NSAIDs) and supplements.  This website has more information on Xarelto: [email or web address]

## 2013-07-29 NOTE — Progress Notes (Addendum)
Patient ID: [name] Hinton, male   DOB: 1971/04/01, 43 y.o.   MRN: [id number]    SUBJECTIVE: Patient remains in atrial flutter, HR now in 70s.  He feels better after getting IV Lasix.   . furosemide  40 mg Intravenous BID  . lisinopril  2.5 mg Oral Daily  . metoprolol succinate  50 mg Oral Daily  . sodium chloride  3 mL Intravenous Q12H  . sodium chloride  3 mL Intravenous Q12H      Filed Vitals:   07/28/13 1845 07/28/13 1900 07/28/13 1952 07/29/13 0534  BP: 130/78 141/91 132/74 103/57  Pulse: 137 136 139 70  Temp:   98.6 F (37 C) 98.4 F (36.9 C)  TempSrc:   Oral Oral  Resp: 32 30 27 25   Height:   5\' 8"  (1.727 m)   Weight:   70.308 kg (155 lb) 70.308 kg (155 lb)  SpO2: 98% 99% 99% 97%   No intake or output data in the 24 hours ending 07/29/13 0743  LABS: Basic Metabolic Panel:  Recent Labs  07/28/13 1509 07/29/13 0204  NA 138 139  K 5.0 4.3  CL 100 97  CO2 24 28  GLUCOSE 124* 107*  BUN 17 14  CREATININE 1.07 1.26  CALCIUM 9.3 9.4  MG 2.0  --    Liver Function Tests:  Recent Labs  07/28/13 1509  AST 51*  ALT 62*  ALKPHOS 64  BILITOT 1.5*  PROT 6.6  ALBUMIN 4.1   No results found for this basename: LIPASE, AMYLASE,  in the last 72 hours CBC:  Recent Labs  07/28/13 1509 07/29/13 0204  WBC 8.4 7.3  NEUTROABS 6.8  --   HGB 13.3 13.1  HCT 40.1 38.6*  MCV 92.2 91.7  PLT 193 205   Cardiac Enzymes:  Recent Labs  07/28/13 1509 07/28/13 2100 07/29/13 0204  TROPONINI <0.30 <0.30 <0.30   BNP: No components found with this basename: POCBNP,  D-Dimer: No results found for this basename: DDIMER,  in the last 72 hours Hemoglobin A1C: No results found for this basename: HGBA1C,  in the last 72 hours Fasting Lipid Panel:  Recent Labs  07/28/13 2207  CHOL 140  HDL 54  LDLCALC 76  TRIG 48  CHOLHDL 2.6   Thyroid Function Tests:  Recent Labs  07/28/13 1509  TSH 1.214   Anemia Panel: No results found for this basename: VITAMINB12,  FOLATE, FERRITIN, TIBC, IRON, RETICCTPCT,  in the last 72 hours  RADIOLOGY: Ct Angio Chest W/cm &/or Wo Cm  07/28/2013   CLINICAL DATA:  Short of breath, tachycardia  EXAM: CT ANGIOGRAPHY CHEST WITH CONTRAST  TECHNIQUE: Multidetector CT imaging of the chest was performed using the standard protocol during bolus administration of intravenous contrast. Multiplanar CT image reconstructions including MIPs were obtained to evaluate the vascular anatomy.  CONTRAST:  142mL OMNIPAQUE IOHEXOL 350 MG/ML SOLN  COMPARISON:  DG CHEST 1V PORT dated 07/28/2013  FINDINGS: There are no filling defects within the pulmonary arteries to suggest acute pulmonary embolism. No acute findings of the aorta or great vessels. The ascending aorta is dilated to 5.1 cm (sagittal image 84). The left ventricle is hypertrophied. No pericardial fluid.  No axillary or supraclavicular lymphadenopathy. There is mild paratracheal lymphadenopathy lower paratracheal lymph node measuring up to 1.5 cm short axis. .  No overt pulmonary edema. There is interlobular septal thickening consistent with interstitial edema. No pleural fluid.  Limited view of the upper abdomen is unremarkable. Limited view of the  skeleton unremarkable.  Review of the MIP images confirms the above findings.  It  IMPRESSION: 1. No acute pulmonary embolism. 2. Left ventricular hypertrophy advanced for age. Recommend cardiology consultation for potential heart failure in this young patient. 3. Interlobular septal thickening consistent with interstitial edema. 4. Mild paratracheal adenopathy is likely reactive. 5. 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 Recommend non emergent cardiothoracic surgery consultation.   Electronically Signed   By: Suzy Bouchard M.D.   On: 07/28/2013 16:43   Dg Chest Portable 1 View  07/28/2013   CLINICAL DATA:  Dyspnea and tachycardia  EXAM: PORTABLE CHEST - 1 VIEW  COMPARISON:  None.  FINDINGS: The cardiopericardial silhouette is  enlarged. There is mild prominence of these pulmonary vascularity with mildly increased interstitial markings bilaterally. There is no pleural effusion. There is no alveolar infiltrate. The mediastinum is normal in width. The observed portions of the bony thorax exhibit no acute abnormalities.  IMPRESSION: Enlargement of the cardiac silhouette which may reflect underlying chamber dilation or hypertrophy but a pericardial effusion is not excluded. Prominence of the pulmonary vascularity and pulmonary interstitium suggests sign rib mild CHF.   Electronically Signed   By: David  Martinique   On: 07/28/2013 15:45    PHYSICAL EXAM General: NAD Neck: JVP 12 cm, no thyromegaly or thyroid nodule.  Lungs: Slight crackles at bases CV: Nondisplaced PMI.  Heart regular S1/S2, no Y8/M5, 3/6 diastolic murmur along the sternal border.  No peripheral edema.  No carotid bruit.  Normal pedal pulses.  Abdomen: Soft, nontender, no hepatosplenomegaly, no distention.  Neurologic: Alert and oriented x 3.  Psych: Normal affect. Extremities: No clubbing or cyanosis.   TELEMETRY: Reviewed telemetry pt in atrial flutter, rate 70s  ASSESSMENT AND PLAN: 43 yo with history of ETOH abuse presented with atrial flutter/RVR and acute CHF.  1. Atrial flutter: HR now controlled in 70s on diltiazem gtt.  Still in atrial flutter (based on symptoms, probably in flutter with RVR for several days).  Patient needs to come out of atrial flutter either by cardioversion or ablation.  Suspect flutter triggered by underlying cardiac abnormalities.  He has not been drinking heavily for about 4 months now.  - Will get echocardiogram.   - Transition to Xarelto 20 mg daily, off heparin gtt.  CHADSVASC = 1 or 2 (CHF, ?HTN).  Would plan TEE-guided DCCV after 3 doses of Xarelto (may be able to go home over weekend and return) versus EP consult with flutter ablation.  Would like to see echo before deciding on one course versus the other.  - Stop  diltiazem, start Toprol XL 50 mg daily.  2. Acute CHF: Patient has diastolic murmur on exam, he is clearly still volume overloaded with JVD.  - Echo today - Lasix 40 mg IV bid - Add lisinopril 2.5 mg daily (as above, transitioning off diltiazem and onto Toprol XL).  3. Dilated ascending aorta: 5.1 cm on CT yesterday.  This will need followup.  Needs assessment for bicuspid aortic valve (echo today).  4. Diastolic murmur: Suspect significant aortic insufficiency.   [name] 07/29/2013 7:49 AM

## 2013-07-30 LAB — CBC
HCT: 43.8 % (ref 39.0–52.0)
Hemoglobin: 14.5 g/dL (ref 13.0–17.0)
MCH: 30.9 pg (ref 26.0–34.0)
MCHC: 33.1 g/dL (ref 30.0–36.0)
MCV: 93.2 fL (ref 78.0–100.0)
Platelets: 240 10*3/uL (ref 150–400)
RBC: 4.7 MIL/uL (ref 4.22–5.81)
RDW: 13.8 % (ref 11.5–15.5)
WBC: 7.4 10*3/uL (ref 4.0–10.5)

## 2013-07-30 LAB — COMPREHENSIVE METABOLIC PANEL
ALT: 58 U/L — ABNORMAL HIGH (ref 0–53)
AST: 40 U/L — ABNORMAL HIGH (ref 0–37)
Albumin: 3.8 g/dL (ref 3.5–5.2)
Alkaline Phosphatase: 64 U/L (ref 39–117)
BUN: 20 mg/dL (ref 6–23)
CO2: 33 mEq/L — ABNORMAL HIGH (ref 19–32)
Calcium: 9.8 mg/dL (ref 8.4–10.5)
Chloride: 94 mEq/L — ABNORMAL LOW (ref 96–112)
Creatinine, Ser: 1.25 mg/dL (ref 0.50–1.35)
GFR calc Af Amer: 82 mL/min — ABNORMAL LOW (ref >90)
GFR calc non Af Amer: 71 mL/min — ABNORMAL LOW (ref >90)
Glucose, Bld: 160 mg/dL — ABNORMAL HIGH (ref 70–99)
Potassium: 3.8 mEq/L (ref 3.7–5.3)
Sodium: 139 mEq/L (ref 137–147)
Total Bilirubin: 1.4 mg/dL — ABNORMAL HIGH (ref 0.3–1.2)
Total Protein: 6.5 g/dL (ref 6.0–8.3)

## 2013-07-30 MED ORDER — FUROSEMIDE 40 MG PO TABS
40.0000 mg | ORAL_TABLET | Freq: Two times a day (BID) | ORAL | Status: DC
  Administered 2013-07-30 – 2013-08-01 (×5): 40 mg via ORAL
  Filled 2013-07-30 (×7): qty 1

## 2013-07-30 NOTE — Progress Notes (Signed)
[facility] PROGRESS NOTE  Deanta Mincey BMW:[id number] DOB: Dec 12, 1970 DOA: 07/28/2013 PCP: No primary provider on file.  Assessment/Plan: 1. Atrial fibrillation - Cardiology on board and managing we'll defer treatment recommendations to them - Disposition per cardiology: To [facility] for EP consultation and TEE for further recommendations, please refer to cardiology notes on 1/9 and 1/10 - Patient started on xarelto and currently on metoprolol  2. heart failure - Cardiology on board and managing we'll defer treatment recommendations to them - Disposition per cardiology  3. Transaminitis - Most likely due to alcohol use - No abdominal complaints - We'll plan on patient following up with his primary care physician as outpatient and we'll reassess next a.m.  4. 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 Aortic valve bicuspid with eccentric aortic insufficiency that could be severe per her reports. - Once cleared by cardiology we'll recommend followup with thoracic surgeon  5. Hypertension - Currently on metoprolol and lisinopril.  Code Status: full Family Communication:  Discussed with pateint  Disposition Plan: Transfer to [facility] for further evaluation and planning by cardiology.   Consultants:  Cardiology  Procedures:  Echocardiogram  Antibiotics:  None  HPI/Subjective: Pt has no new complaints today. Feels better.  Objective: Filed Vitals:   07/30/13 0615  BP: 101/56  Pulse: 72  Temp: 98.4 F (36.9 C)  Resp: 20    Intake/Output Summary (Last 24 hours) at 07/30/13 1317 Last data filed at 07/30/13 0958  Gross per 24 hour  Intake    600 ml  Output   2975 ml  Net  -2375 ml   Filed Weights   07/28/13 1952 07/29/13 0534 07/30/13 0615  Weight: 70.308 kg (155 lb) 70.308 kg (155 lb) 67.405 kg (148 lb 9.6 oz)    Exam:   General:  Pt in NAD, Alert and  awake  Cardiovascular: warm extremities, no cyanosis  Respiratory: No increased work of breathing, no wheezes  Abdomen: soft, NT, ND  Musculoskeletal: no cyanosis or clubbing   Data Reviewed: Basic Metabolic Panel:  Recent Labs Lab 07/28/13 1509 07/29/13 0204 07/30/13 0410  NA 138 139 139  K 5.0 4.3 3.8  CL 100 97 94*  CO2 24 28 33*  GLUCOSE 124* 107* 160*  BUN 17 14 20   CREATININE 1.07 1.26 1.25  CALCIUM 9.3 9.4 9.8  MG 2.0  --   --    Liver Function Tests:  Recent Labs Lab 07/28/13 1509 07/30/13 0410  AST 51* 40*  ALT 62* 58*  ALKPHOS 64 64  BILITOT 1.5* 1.4*  PROT 6.6 6.5  ALBUMIN 4.1 3.8   No results found for this basename: LIPASE, AMYLASE,  in the last 168 hours No results found for this basename: AMMONIA,  in the last 168 hours CBC:  Recent Labs Lab 07/28/13 1509 07/29/13 0204 07/30/13 0410  WBC 8.4 7.3 7.4  NEUTROABS 6.8  --   --   HGB 13.3 13.1 14.5  HCT 40.1 38.6* 43.8  MCV 92.2 91.7 93.2  PLT 193 205 240   Cardiac Enzymes:  Recent Labs Lab 07/28/13 1509 07/28/13 2100 07/29/13 0204 07/29/13 0800  TROPONINI <0.30 <0.30 <0.30 <0.30   BNP (last 3 results)  Recent Labs  07/28/13 1653  PROBNP 4883.0*   CBG: No results found for this basename: GLUCAP,  in the last 168 hours  No results found for this or any previous visit (from the past 240 hour(s)).  Studies: Ct Angio Chest W/cm &/or Wo Cm  07/28/2013   CLINICAL DATA:  Short of breath, tachycardia  EXAM: CT ANGIOGRAPHY CHEST WITH CONTRAST  TECHNIQUE: Multidetector CT imaging of the chest was performed using the standard protocol during bolus administration of intravenous contrast. Multiplanar CT image reconstructions including MIPs were obtained to evaluate the vascular anatomy.  CONTRAST:  130mL OMNIPAQUE IOHEXOL 350 MG/ML SOLN  COMPARISON:  DG CHEST 1V PORT dated 07/28/2013  FINDINGS: There are no filling defects within the pulmonary arteries to suggest acute pulmonary embolism. No  acute findings of the aorta or great vessels. The ascending aorta is dilated to 5.1 cm (sagittal image 84). The left ventricle is hypertrophied. No pericardial fluid.  No axillary or supraclavicular lymphadenopathy. There is mild paratracheal lymphadenopathy lower paratracheal lymph node measuring up to 1.5 cm short axis. .  No overt pulmonary edema. There is interlobular septal thickening consistent with interstitial edema. No pleural fluid.  Limited view of the upper abdomen is unremarkable. Limited view of the skeleton unremarkable.  Review of the MIP images confirms the above findings.  It  IMPRESSION: 1. No acute pulmonary embolism. 2. Left ventricular hypertrophy advanced for age. Recommend cardiology consultation for potential heart failure in this young patient. 3. Interlobular septal thickening consistent with interstitial edema. 4. Mild paratracheal adenopathy is likely reactive. 5. 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 Recommend non emergent cardiothoracic surgery consultation.   Electronically Signed   By: Suzy Bouchard M.D.   On: 07/28/2013 16:43   Dg Chest Portable 1 View  07/28/2013   CLINICAL DATA:  Dyspnea and tachycardia  EXAM: PORTABLE CHEST - 1 VIEW  COMPARISON:  None.  FINDINGS: The cardiopericardial silhouette is enlarged. There is mild prominence of these pulmonary vascularity with mildly increased interstitial markings bilaterally. There is no pleural effusion. There is no alveolar infiltrate. The mediastinum is normal in width. The observed portions of the bony thorax exhibit no acute abnormalities.  IMPRESSION: Enlargement of the cardiac silhouette which may reflect underlying chamber dilation or hypertrophy but a pericardial effusion is not excluded. Prominence of the pulmonary vascularity and pulmonary interstitium suggests sign rib mild CHF.   Electronically Signed   By: David  Martinique   On: 07/28/2013 15:45    Scheduled Meds: . furosemide  40 mg Oral BID  .  lisinopril  2.5 mg Oral Daily  . metoprolol succinate  50 mg Oral Daily  . rivaroxaban  20 mg Oral Q supper  . sodium chloride  3 mL Intravenous Q12H  . sodium chloride  3 mL Intravenous Q12H   Continuous Infusions:   Active Problems:   CHF (congestive heart failure)    Time spent: > 35 minutes    Velvet Bathe  [facility] Pager [phone]. If 7PM-7AM, please contact night-coverage at [email or web address], password [facility] 07/30/2013, 1:17 PM  LOS: 2 days

## 2013-07-30 NOTE — Progress Notes (Addendum)
Patient Name: [name] Hinton Date of Encounter: 07/30/2013     Active Problems:   CHF (congestive heart failure)    SUBJECTIVE  States that he feels better.  Dyspnea is improved.  He remains in atrial flutter with controlled ventricular response.  Needs TEE.  Also needs EP consultation  CURRENT MEDS . furosemide  40 mg Intravenous BID  . lisinopril  2.5 mg Oral Daily  . metoprolol succinate  50 mg Oral Daily  . rivaroxaban  20 mg Oral Q supper  . sodium chloride  3 mL Intravenous Q12H  . sodium chloride  3 mL Intravenous Q12H    OBJECTIVE  Filed Vitals:   07/29/13 0534 07/29/13 1357 07/29/13 2150 07/30/13 0615  BP: 103/57 107/56 96/50 101/56  Pulse: 70 71 73 72  Temp: 98.4 F (36.9 C) 97.6 F (36.4 C) 98.2 F (36.8 C) 98.4 F (36.9 C)  TempSrc: Oral Oral Oral Oral  Resp: 25 24 22 20   Height:      Weight: 155 lb (70.308 kg)   148 lb 9.6 oz (67.405 kg)  SpO2: 97% 99% 99% 99%    Intake/Output Summary (Last 24 hours) at 07/30/13 0910 Last data filed at 07/30/13 0345  Gross per 24 hour  Intake    720 ml  Output   2275 ml  Net  -1555 ml   Filed Weights   07/28/13 1952 07/29/13 0534 07/30/13 0615  Weight: 155 lb (70.308 kg) 155 lb (70.308 kg) 148 lb 9.6 oz (67.405 kg)    PHYSICAL EXAM  General: Pleasant, NAD. Neuro: Alert and oriented X 3. Moves all extremities spontaneously. Psych: Normal affect. HEENT:  Normal  Neck: Supple without bruits or JVD. Lungs:  Resp regular and unlabored, CTA. Heart: Irregular rhythm.  Grade 2/6 decrescendo murmur of aortic insufficiency. Abdomen: Soft, non-tender, non-distended, BS + x 4.  Extremities: No clubbing, cyanosis or edema. DP/PT/Radials 2+ and equal bilaterally.  Accessory Clinical Findings  CBC  Recent Labs  07/28/13 1509 07/29/13 0204 07/30/13 0410  WBC 8.4 7.3 7.4  NEUTROABS 6.8  --   --   HGB 13.3 13.1 14.5  HCT 40.1 38.6* 43.8  MCV 92.2 91.7 93.2  PLT 193 205 660   Basic Metabolic Panel  Recent  Labs  07/28/13 1509 07/29/13 0204 07/30/13 0410  NA 138 139 139  K 5.0 4.3 3.8  CL 100 97 94*  CO2 24 28 33*  GLUCOSE 124* 107* 160*  BUN 17 14 20   CREATININE 1.07 1.26 1.25  CALCIUM 9.3 9.4 9.8  MG 2.0  --   --    Liver Function Tests  Recent Labs  07/28/13 1509 07/30/13 0410  AST 51* 40*  ALT 62* 58*  ALKPHOS 64 64  BILITOT 1.5* 1.4*  PROT 6.6 6.5  ALBUMIN 4.1 3.8   No results found for this basename: LIPASE, AMYLASE,  in the last 72 hours Cardiac Enzymes  Recent Labs  07/28/13 2100 07/29/13 0204 07/29/13 0800  TROPONINI <0.30 <0.30 <0.30   BNP No components found with this basename: POCBNP,  D-Dimer No results found for this basename: DDIMER,  in the last 72 hours Hemoglobin A1C No results found for this basename: HGBA1C,  in the last 72 hours Fasting Lipid Panel  Recent Labs  07/28/13 2207  CHOL 140  HDL 54  LDLCALC 76  TRIG 48  CHOLHDL 2.6   Thyroid Function Tests  Recent Labs  07/28/13 1509  TSH 1.214    TELE  Atrial flutter  with controlled ventricular response.  ECG  2-D echo - Moderately dilated LV with severe global hypokinesis, EF 25%. Mildly dilated RV with moderately decreased systolic function. The aortic valve is bicuspid and mal-coapts. There is significant eccentric aortic insufficiency. AI may be severe. The patient needsa TEE to assess the aortic insufficiency. The LV dilation/dysfunction could potentially be secondary to severe AI, but given co-existing RV dysfunction would be concerned for a more systemic process (ETOH cardiomyopathy).   Radiology/Studies  Ct Angio Chest W/cm &/or Wo Cm  07/28/2013   CLINICAL DATA:  Short of breath, tachycardia  EXAM: CT ANGIOGRAPHY CHEST WITH CONTRAST  TECHNIQUE: Multidetector CT imaging of the chest was performed using the standard protocol during bolus administration of intravenous contrast. Multiplanar CT image reconstructions including MIPs were obtained to evaluate the  vascular anatomy.  CONTRAST:  115mL OMNIPAQUE IOHEXOL 350 MG/ML SOLN  COMPARISON:  DG CHEST 1V PORT dated 07/28/2013  FINDINGS: There are no filling defects within the pulmonary arteries to suggest acute pulmonary embolism. No acute findings of the aorta or great vessels. The ascending aorta is dilated to 5.1 cm (sagittal image 84). The left ventricle is hypertrophied. No pericardial fluid.  No axillary or supraclavicular lymphadenopathy. There is mild paratracheal lymphadenopathy lower paratracheal lymph node measuring up to 1.5 cm short axis. .  No overt pulmonary edema. There is interlobular septal thickening consistent with interstitial edema. No pleural fluid.  Limited view of the upper abdomen is unremarkable. Limited view of the skeleton unremarkable.  Review of the MIP images confirms the above findings.  It  IMPRESSION: 1. No acute pulmonary embolism. 2. Left ventricular hypertrophy advanced for age. Recommend cardiology consultation for potential heart failure in this young patient. 3. Interlobular septal thickening consistent with interstitial edema. 4. Mild paratracheal adenopathy is likely reactive. 5. 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 Recommend non emergent cardiothoracic surgery consultation.   Electronically Signed   By: Suzy Bouchard M.D.   On: 07/28/2013 16:43   Dg Chest Portable 1 View  07/28/2013   CLINICAL DATA:  Dyspnea and tachycardia  EXAM: PORTABLE CHEST - 1 VIEW  COMPARISON:  None.  FINDINGS: The cardiopericardial silhouette is enlarged. There is mild prominence of these pulmonary vascularity with mildly increased interstitial markings bilaterally. There is no pleural effusion. There is no alveolar infiltrate. The mediastinum is normal in width. The observed portions of the bony thorax exhibit no acute abnormalities.  IMPRESSION: Enlargement of the cardiac silhouette which may reflect underlying chamber dilation or hypertrophy but a pericardial effusion is not  excluded. Prominence of the pulmonary vascularity and pulmonary interstitium suggests sign rib mild CHF.   Electronically Signed   By: David  Martinique   On: 07/28/2013 15:45    ASSESSMENT AND PLAN 1.  Atrial flutter.  Heart rate controlled on beta blocker.  Now on Xarelto 2. acute congestive heart failure.  Echo shows severe left ventricular systolic dysfunction with ejection fraction 25% 3. bicuspid aortic valve with significant eccentric aortic insufficiency 4. dilated ascending aorta 5. past history of excessive alcohol abuse  Recommendation: Continue beta blocker.  Continue Xarelto.  Will transition to oral Lasix today.  Would recommend transfer to [facility] at some point over the weekend to allow TEE early next week and also to allow EP consultation and possible surgical consultation regarding his significant aortic valve disease.  We will need to have EP see him to determine whether to proceed with ablation or with TEE guided cardioversion  Signed, Darlin Coco NP

## 2013-07-31 DIAGNOSIS — I4891 Unspecified atrial fibrillation: Secondary | ICD-10-CM

## 2013-07-31 LAB — CBC
HCT: 42.5 % (ref 39.0–52.0)
Hemoglobin: 14.2 g/dL (ref 13.0–17.0)
MCH: 30.6 pg (ref 26.0–34.0)
MCHC: 33.4 g/dL (ref 30.0–36.0)
MCV: 91.6 fL (ref 78.0–100.0)
Platelets: 254 10*3/uL (ref 150–400)
RBC: 4.64 MIL/uL (ref 4.22–5.81)
RDW: 13.8 % (ref 11.5–15.5)
WBC: 7.5 10*3/uL (ref 4.0–10.5)

## 2013-07-31 MED ORDER — ACETAMINOPHEN 325 MG PO TABS
650.0000 mg | ORAL_TABLET | Freq: Once | ORAL | Status: AC
  Administered 2013-07-31: 650 mg via ORAL
  Filled 2013-07-31: qty 2

## 2013-07-31 MED ORDER — SODIUM CHLORIDE 0.9 % IV SOLN
INTRAVENOUS | Status: DC
  Administered 2013-08-01: 07:00:00 via INTRAVENOUS

## 2013-07-31 MED ORDER — OFF THE BEAT BOOK
Freq: Once | Status: AC
  Administered 2013-07-31
  Filled 2013-07-31: qty 1

## 2013-07-31 NOTE — Progress Notes (Signed)
[facility] hospital[facility] progress note. Chief complaint. Transfer note. History of present illness. This 43 year old male in hospital with atrial fib, CHF, transaminitis, aneurysm dilation of the ascending aorta 5.1 cm. He was sent requested by cardiology that patient be transferred from [facility] to [facility]. Patient is now arrived and I'm seeing the patient at bedside to ensure he remained stable post transfer and then his orders transferred appropriately. Patient has no current complaints. Vital signs. Temperature 90.8, pulse 89, respiration 18, blood pressure 119/76. O2 sats 100%. General appearance. Well-developed male who is alert, cooperative, and in no distress. Cardiac. Rate and rhythm regular. Lungs. Breath sounds are clear. Abdomen. Soft with positive bowel sounds. Impression/plan. Problem 1. Atrial fib. Patient for electrophysiology consult and TEE post transfer to [facility]. Problem #2. Heart failure. Patient appears currently stable and continues on IV Lasix. 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 Echocardiogram shows EF 25% with diffuse hypokinesis. Severe aortic insufficiency noted. Will ultimately need followup with cardiothoracic surgeon. Patient appears clinically stable post transfer. All orders appear to transferred appropriately.

## 2013-07-31 NOTE — Progress Notes (Signed)
Clinical Social Work Department BRIEF PSYCHOSOCIAL ASSESSMENT 07/31/2013  Patient:  Dittrich,Cable     Account Number:  [id number]     [place] date:  07/28/2013  Clinical Social Worker:  Levie Heritage  Date/Time:  07/31/2013 01:07 PM  Referred by:  Physician  Date Referred:  07/31/2013 Referred for  Other - See comment   Other Referral:   Problems with paying hospital bills   Interview type:  Patient Other interview type:    PSYCHOSOCIAL DATA Living Status:  ALONE Admitted from facility:   Level of care:   Primary support name:  Raylene Everts Primary support relationship to patient:  PARENT Degree of support available:   adequate    CURRENT CONCERNS Current Concerns  Financial Resources   Other Concerns:    SOCIAL WORK ASSESSMENT / PLAN Consult received due to Pt's concern with paying current stay hospital bills.    Met with Pt.    Pt reported that, although he currently does not have ins, he has signed up on the insurance exchange website and he will have insurance effective Feb. 1st.  Pt makes too much to qualify for DSS assistance/[place].    CSW encouraged Pt to contact the Patient Representative number that will be on the bill that he receives from [facility] and speak with them about his financial situation. CSW explained that they may work with Pt to set up a payment plan.    CSW thanked Pt for his time.   Assessment/plan status:  No Further Intervention Required Other assessment/ plan:   Information/referral to community resources:   n/a    PATIENT'S/FAMILY'S RESPONSE TO PLAN OF CARE: Pt calm, cooperative and pleasant.    Pt expressed his frustration with himself for not heeding is father's advice and getting insurance earlier.  Pt hopeful that [facility] will work with him on a payment plan.    Pt thanked CSW for time and assistance.   Bernita Raisin, [place] Work [phone]

## 2013-07-31 NOTE — Progress Notes (Signed)
Patient Name: [name] Hinton Date of Encounter: 07/31/2013     Active Problems:   CHF (congestive heart failure)    SUBJECTIVE  The patient has not had any acute dyspnea.  No chest pain.  EKG today shows atrial fibrillation with controlled ventricular response.  CURRENT MEDS . furosemide  40 mg Oral BID  . lisinopril  2.5 mg Oral Daily  . metoprolol succinate  50 mg Oral Daily  . rivaroxaban  20 mg Oral Q supper  . sodium chloride  3 mL Intravenous Q12H  . sodium chloride  3 mL Intravenous Q12H    OBJECTIVE  Filed Vitals:   07/29/13 2150 07/30/13 0615 07/30/13 2044 07/31/13 0452  BP: 96/50 101/56 101/52 112/52  Pulse: 73 72 92 89  Temp: 98.2 F (36.8 C) 98.4 F (36.9 C) 97.3 F (36.3 C) 97.4 F (36.3 C)  TempSrc: Oral Oral Oral Axillary  Resp: 22 20 19 18   Height:      Weight:  148 lb 9.6 oz (67.405 kg)  149 lb 4 oz (67.7 kg)  SpO2: 99% 99% 97% 100%    Intake/Output Summary (Last 24 hours) at 07/31/13 1115 Last data filed at 07/30/13 1200  Gross per 24 hour  Intake    240 ml  Output      0 ml  Net    240 ml   Filed Weights   07/29/13 0534 07/30/13 0615 07/31/13 0452  Weight: 155 lb (70.308 kg) 148 lb 9.6 oz (67.405 kg) 149 lb 4 oz (67.7 kg)    PHYSICAL EXAM  General: Pleasant, NAD. Neuro: Alert and oriented X 3. Moves all extremities spontaneously. Psych: Normal affect. HEENT:  Normal  Neck: Supple without bruits or JVD. Lungs:  Resp regular and unlabored, CTA. Heart: Irregular rhythm.  Grade 2/6 decrescendo murmur of aortic insufficiency. Abdomen: Soft, non-tender, non-distended, BS + x 4.  Extremities: No clubbing, cyanosis or edema. DP/PT/Radials 2+ and equal bilaterally.  Accessory Clinical Findings  CBC  Recent Labs  07/28/13 1509  07/30/13 0410 07/31/13 0445  WBC 8.4  < > 7.4 7.5  NEUTROABS 6.8  --   --   --   HGB 13.3  < > 14.5 14.2  HCT 40.1  < > 43.8 42.5  MCV 92.2  < > 93.2 91.6  PLT 193  < > 240 254  < > = values in this  interval not displayed. Basic Metabolic Panel  Recent Labs  07/28/13 1509 07/29/13 0204 07/30/13 0410  NA 138 139 139  K 5.0 4.3 3.8  CL 100 97 94*  CO2 24 28 33*  GLUCOSE 124* 107* 160*  BUN 17 14 20   CREATININE 1.07 1.26 1.25  CALCIUM 9.3 9.4 9.8  MG 2.0  --   --    Liver Function Tests  Recent Labs  07/28/13 1509 07/30/13 0410  AST 51* 40*  ALT 62* 58*  ALKPHOS 64 64  BILITOT 1.5* 1.4*  PROT 6.6 6.5  ALBUMIN 4.1 3.8   No results found for this basename: LIPASE, AMYLASE,  in the last 72 hours Cardiac Enzymes  Recent Labs  07/28/13 2100 07/29/13 0204 07/29/13 0800  TROPONINI <0.30 <0.30 <0.30   BNP No components found with this basename: POCBNP,  D-Dimer No results found for this basename: DDIMER,  in the last 72 hours Hemoglobin A1C No results found for this basename: HGBA1C,  in the last 72 hours Fasting Lipid Panel  Recent Labs  07/28/13 2207  CHOL 140  HDL  47  LDLCALC 76  TRIG 48  CHOLHDL 2.6   Thyroid Function Tests  Recent Labs  07/28/13 1509  TSH 1.214    TELE Atrial fibrillation with controlled ventricular response  ECG  2-D echo - Moderately dilated LV with severe global hypokinesis, EF 25%. Mildly dilated RV with moderately decreased systolic function. The aortic valve is bicuspid and mal-coapts. There is significant eccentric aortic insufficiency. AI may be severe. The patient needsa TEE to assess the aortic insufficiency. The LV dilation/dysfunction could potentially be secondary to severe AI, but given co-existing RV dysfunction would be concerned for a more systemic process (ETOH cardiomyopathy).   Radiology/Studies  Ct Angio Chest W/cm &/or Wo Cm  07/28/2013   CLINICAL DATA:  Short of breath, tachycardia  EXAM: CT ANGIOGRAPHY CHEST WITH CONTRAST  TECHNIQUE: Multidetector CT imaging of the chest was performed using the standard protocol during bolus administration of intravenous contrast. Multiplanar CT image  reconstructions including MIPs were obtained to evaluate the vascular anatomy.  CONTRAST:  143mL OMNIPAQUE IOHEXOL 350 MG/ML SOLN  COMPARISON:  DG CHEST 1V PORT dated 07/28/2013  FINDINGS: There are no filling defects within the pulmonary arteries to suggest acute pulmonary embolism. No acute findings of the aorta or great vessels. The ascending aorta is dilated to 5.1 cm (sagittal image 84). The left ventricle is hypertrophied. No pericardial fluid.  No axillary or supraclavicular lymphadenopathy. There is mild paratracheal lymphadenopathy lower paratracheal lymph node measuring up to 1.5 cm short axis. .  No overt pulmonary edema. There is interlobular septal thickening consistent with interstitial edema. No pleural fluid.  Limited view of the upper abdomen is unremarkable. Limited view of the skeleton unremarkable.  Review of the MIP images confirms the above findings.  It  IMPRESSION: 1. No acute pulmonary embolism. 2. Left ventricular hypertrophy advanced for age. Recommend cardiology consultation for potential heart failure in this young patient. 3. Interlobular septal thickening consistent with interstitial edema. 4. Mild paratracheal adenopathy is likely reactive. 5. 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 Recommend non emergent cardiothoracic surgery consultation.   Electronically Signed   By: Suzy Bouchard M.D.   On: 07/28/2013 16:43   Dg Chest Portable 1 View  07/28/2013   CLINICAL DATA:  Dyspnea and tachycardia  EXAM: PORTABLE CHEST - 1 VIEW  COMPARISON:  None.  FINDINGS: The cardiopericardial silhouette is enlarged. There is mild prominence of these pulmonary vascularity with mildly increased interstitial markings bilaterally. There is no pleural effusion. There is no alveolar infiltrate. The mediastinum is normal in width. The observed portions of the bony thorax exhibit no acute abnormalities.  IMPRESSION: Enlargement of the cardiac silhouette which may reflect underlying chamber  dilation or hypertrophy but a pericardial effusion is not excluded. Prominence of the pulmonary vascularity and pulmonary interstitium suggests sign rib mild CHF.   Electronically Signed   By: David  Martinique   On: 07/28/2013 15:45    ASSESSMENT AND PLAN 1.  Atrial flutter.  Heart rate controlled on beta blocker.  Now on Xarelto 2. acute congestive heart failure.  Echo shows severe left ventricular systolic dysfunction with ejection fraction 25% 3. bicuspid aortic valve with significant eccentric aortic insufficiency 4. dilated ascending aorta 5. past history of excessive alcohol abuse  Recommendation: We will plan to transfer him to our [facility] today.  We will arrange for TEE cardioversion tomorrow.  I spoke with Dr. Rayann Heman concerning his arrhythmias. He did not recommend atrial flutter ablation at this time.  Therefore  we will proceed with TEE cardioversion.  While he is in the hospital we will also ask thoracic surgeons to see him.  At the present time his left ventricular function is very poor at 25% EF.  Hopefully some of this is secondary to tachycardia mediated cardiomyopathy as opposed to his moderate to severe aortic insufficiency and would be reversible with aggressive medical therapy.  He will eventually need surgery for his bicuspid aortic valve and for his dilated aortic root.  Will ask surgeons to help determine proper timing of his surgery. Signed, Darlin Coco  MD

## 2013-07-31 NOTE — Progress Notes (Signed)
Patient arrived on unit, transferred from [facility].  Report received from [place] at [facility].  Vital signs stable; patient states he is not experiencing any shortness of breath at this time.  Will continue to monitor.

## 2013-07-31 NOTE — Progress Notes (Signed)
[facility] PROGRESS NOTE  [name] Hinton ZMO:[id number] DOB: February 27, 1971 DOA: 07/28/2013 PCP: No primary provider on file. Brief narrative: -Patient is a 43 year old presenting with new onset atrial fibrillation and congestive heart failure. Cardiology currently following and after echocardiogram patient was found to have aortic insufficiency with dilated ascending aorta. Patient was started on beta blocker and xarelto and plans are for TEE for further evaluation and recommendations by cardiology.  Assessment/Plan: 1. Atrial fibrillation - Cardiology on board and managing we'll defer treatment recommendations to them - Disposition per cardiology: To [facility] for [facility] consultation and TEE for further recommendations, please refer to cardiology notes on 1/9 and 1/10 - Patient started on xarelto and currently on metoprolol with heart range ranging from 72-92  2. heart failure - Cardiology on board and managing we'll defer treatment recommendations to them - Disposition per cardiology - Initially patient received 40 mg IV of Lasix 2 times a day, currently on Lasix 40 mg by mouth twice a day - Patient with cardiomegaly on chest x-ray  3. Transaminitis - Most likely due to alcohol use - No abdominal complaints - We'll plan on patient following up with his primary care physician as outpatient and we'll reassess next a.m.  4. 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 Aortic valve bicuspid with eccentric aortic insufficiency that could be severe per her reports. - Once cleared by cardiology we'll recommend followup with cardio thoracic surgeon  5. Hypertension - Currently on metoprolol and lisinopril and blood pressures relatively well controlled on this regimen  Code Status: full Family Communication:  Discussed with pateint  Disposition Plan: Transfer to [facility] for further evaluation and planning by  cardiology.   Consultants:  Cardiology  Procedures:  Echocardiogram  Antibiotics:  None  HPI/Subjective: Patient states that his breathing is much improved since admission. No new complaints reported to me  Objective: Filed Vitals:   07/31/13 0452  BP: 112/52  Pulse: 89  Temp: 97.4 F (36.3 C)  Resp: 18    Intake/Output Summary (Last 24 hours) at 07/31/13 1049 Last data filed at 07/30/13 1200  Gross per 24 hour  Intake    240 ml  Output      0 ml  Net    240 ml   Filed Weights   07/29/13 0534 07/30/13 0615 07/31/13 0452  Weight: 70.308 kg (155 lb) 67.405 kg (148 lb 9.6 oz) 67.7 kg (149 lb 4 oz)    Exam:   General:  Pt in NAD, Alert and awake  Cardiovascular: warm extremities, no cyanosis  Respiratory: No increased work of breathing, no wheezes  Abdomen: soft, NT, ND  Musculoskeletal: no cyanosis or clubbing   Data Reviewed: Basic Metabolic Panel:  Recent Labs Lab 07/28/13 1509 07/29/13 0204 07/30/13 0410  NA 138 139 139  K 5.0 4.3 3.8  CL 100 97 94*  CO2 24 28 33*  GLUCOSE 124* 107* 160*  BUN 17 14 20   CREATININE 1.07 1.26 1.25  CALCIUM 9.3 9.4 9.8  MG 2.0  --   --    Liver Function Tests:  Recent Labs Lab 07/28/13 1509 07/30/13 0410  AST 51* 40*  ALT 62* 58*  ALKPHOS 64 64  BILITOT 1.5* 1.4*  PROT 6.6 6.5  ALBUMIN 4.1 3.8   No results found for this basename: LIPASE, AMYLASE,  in the last 168 hours No results found for this basename: AMMONIA,  in the last 168 hours  CBC:  Recent Labs Lab 07/28/13 1509 07/29/13 0204 07/30/13 0410 07/31/13 0445  WBC 8.4 7.3 7.4 7.5  NEUTROABS 6.8  --   --   --   HGB 13.3 13.1 14.5 14.2  HCT 40.1 38.6* 43.8 42.5  MCV 92.2 91.7 93.2 91.6  PLT 193 205 240 254   Cardiac Enzymes:  Recent Labs Lab 07/28/13 1509 07/28/13 2100 07/29/13 0204 07/29/13 0800  TROPONINI <0.30 <0.30 <0.30 <0.30   BNP (last 3 results)  Recent Labs  07/28/13 1653  PROBNP 4883.0*   CBG: No results  found for this basename: GLUCAP,  in the last 168 hours  No results found for this or any previous visit (from the past 240 hour(s)).   Studies: No results found.  Scheduled Meds: . furosemide  40 mg Oral BID  . lisinopril  2.5 mg Oral Daily  . metoprolol succinate  50 mg Oral Daily  . rivaroxaban  20 mg Oral Q supper  . sodium chloride  3 mL Intravenous Q12H  . sodium chloride  3 mL Intravenous Q12H   Continuous Infusions:   Active Problems:   CHF (congestive heart failure)    Time spent: > 35 minutes    Velvet Bathe  [facility] Pager [phone]. If 7PM-7AM, please contact night-coverage at [email or web address], password [facility] 07/31/2013, 10:49 AM  LOS: 3 days

## 2013-08-01 ENCOUNTER — Other Ambulatory Visit: Payer: Self-pay | Admitting: *Deleted

## 2013-08-01 ENCOUNTER — Encounter (HOSPITAL_COMMUNITY): Payer: Self-pay

## 2013-08-01 ENCOUNTER — Encounter (HOSPITAL_COMMUNITY): Payer: MEDICAID | Admitting: Anesthesiology

## 2013-08-01 ENCOUNTER — Encounter (HOSPITAL_COMMUNITY)
Admission: EM | Disposition: A | Discharge status: Home / Self Care | Payer: Self-pay | Source: Home / Self Care | Attending: Internal Medicine

## 2013-08-01 ENCOUNTER — Other Ambulatory Visit (HOSPITAL_COMMUNITY): Payer: Self-pay | Admitting: Physician Assistant

## 2013-08-01 ENCOUNTER — Inpatient Hospital Stay (HOSPITAL_COMMUNITY): Payer: Self-pay | Admitting: Anesthesiology

## 2013-08-01 ENCOUNTER — Other Ambulatory Visit: Payer: Self-pay | Admitting: Physician Assistant

## 2013-08-01 DIAGNOSIS — I429 Cardiomyopathy, unspecified: Secondary | ICD-10-CM

## 2013-08-01 DIAGNOSIS — Z01810 Encounter for preprocedural cardiovascular examination: Secondary | ICD-10-CM

## 2013-08-01 DIAGNOSIS — I359 Nonrheumatic aortic valve disorder, unspecified: Secondary | ICD-10-CM

## 2013-08-01 DIAGNOSIS — I5021 Acute systolic (congestive) heart failure: Principal | ICD-10-CM

## 2013-08-01 DIAGNOSIS — I4891 Unspecified atrial fibrillation: Secondary | ICD-10-CM

## 2013-08-01 DIAGNOSIS — I059 Rheumatic mitral valve disease, unspecified: Secondary | ICD-10-CM

## 2013-08-01 HISTORY — PX: TEE WITHOUT CARDIOVERSION: SHX5443

## 2013-08-01 HISTORY — PX: CARDIOVERSION: SHX1299

## 2013-08-01 LAB — BASIC METABOLIC PANEL
BUN: 28 mg/dL — ABNORMAL HIGH (ref 6–23)
CO2: 31 mEq/L (ref 19–32)
Calcium: 9 mg/dL (ref 8.4–10.5)
Chloride: 97 mEq/L (ref 96–112)
Creatinine, Ser: 1.3 mg/dL (ref 0.50–1.35)
GFR calc Af Amer: 78 mL/min — ABNORMAL LOW (ref >90)
GFR calc non Af Amer: 67 mL/min — ABNORMAL LOW (ref >90)
Glucose, Bld: 110 mg/dL — ABNORMAL HIGH (ref 70–99)
Potassium: 5.2 mEq/L (ref 3.7–5.3)
Sodium: 139 mEq/L (ref 137–147)

## 2013-08-01 LAB — CBC
HCT: 40.8 % (ref 39.0–52.0)
Hemoglobin: 13.5 g/dL (ref 13.0–17.0)
MCH: 30.1 pg (ref 26.0–34.0)
MCHC: 33.1 g/dL (ref 30.0–36.0)
MCV: 90.9 fL (ref 78.0–100.0)
Platelets: 215 10*3/uL (ref 150–400)
RBC: 4.49 MIL/uL (ref 4.22–5.81)
RDW: 13.6 % (ref 11.5–15.5)
WBC: 7.5 10*3/uL (ref 4.0–10.5)

## 2013-08-01 SURGERY — ECHOCARDIOGRAM, TRANSESOPHAGEAL
Anesthesia: Moderate Sedation

## 2013-08-01 MED ORDER — DIPHENHYDRAMINE HCL 50 MG/ML IJ SOLN
INTRAMUSCULAR | Status: AC
  Filled 2013-08-01: qty 1

## 2013-08-01 MED ORDER — FENTANYL CITRATE 0.05 MG/ML IJ SOLN
INTRAMUSCULAR | Status: DC | PRN
  Administered 2013-08-01 (×3): 25 ug via INTRAVENOUS

## 2013-08-01 MED ORDER — FUROSEMIDE 40 MG PO TABS: 40.0000 mg | ORAL_TABLET | Freq: Two times a day (BID) | ORAL | Status: DC

## 2013-08-01 MED ORDER — SODIUM CHLORIDE 0.9 % IJ SOLN
3.0000 mL | Freq: Two times a day (BID) | INTRAMUSCULAR | Status: DC
  Administered 2013-08-01: 3 mL via INTRAVENOUS

## 2013-08-01 MED ORDER — LISINOPRIL 5 MG PO TABS
5.0000 mg | ORAL_TABLET | Freq: Every day | ORAL | Status: DC
  Administered 2013-08-01: 5 mg via ORAL
  Filled 2013-08-01: qty 1

## 2013-08-01 MED ORDER — MIDAZOLAM HCL 5 MG/ML IJ SOLN
INTRAMUSCULAR | Status: AC
  Filled 2013-08-01: qty 3

## 2013-08-01 MED ORDER — METOPROLOL SUCCINATE ER 50 MG PO TB24: 50.0000 mg | ORAL_TABLET | Freq: Every day | ORAL | Status: DC

## 2013-08-01 MED ORDER — RIVAROXABAN 20 MG PO TABS: 20.0000 mg | ORAL_TABLET | Freq: Every day | ORAL | Status: DC

## 2013-08-01 MED ORDER — FENTANYL CITRATE 0.05 MG/ML IJ SOLN
INTRAMUSCULAR | Status: AC
  Filled 2013-08-01: qty 4

## 2013-08-01 MED ORDER — BUTAMBEN-TETRACAINE-BENZOCAINE 2-2-14 % EX AERO
INHALATION_SPRAY | CUTANEOUS | Status: DC | PRN
  Administered 2013-08-01: 2 via TOPICAL

## 2013-08-01 MED ORDER — SPIRONOLACTONE 12.5 MG HALF TABLET
12.5000 mg | ORAL_TABLET | Freq: Every day | ORAL | Status: DC
Start: 2013-08-01 — End: 2013-08-01
  Filled 2013-08-01 (×2): qty 1

## 2013-08-01 MED ORDER — SPIRONOLACTONE 25 MG PO TABS: 12.5000 mg | ORAL_TABLET | Freq: Every day | ORAL | Status: DC

## 2013-08-01 MED ORDER — SODIUM CHLORIDE 0.9 % IV SOLN
250.0000 mL | INTRAVENOUS | Status: DC
  Administered 2013-08-01: 250 mL via INTRAVENOUS

## 2013-08-01 MED ORDER — MIDAZOLAM HCL 10 MG/2ML IJ SOLN
INTRAMUSCULAR | Status: DC | PRN
  Administered 2013-08-01 (×3): 2 mg via INTRAVENOUS

## 2013-08-01 MED ORDER — LISINOPRIL 5 MG PO TABS: 5.0000 mg | ORAL_TABLET | Freq: Every day | ORAL | Status: DC

## 2013-08-01 MED ORDER — SODIUM CHLORIDE 0.9 % IJ SOLN: 3.0000 mL | INTRAMUSCULAR | Status: DC | PRN

## 2013-08-01 NOTE — Discharge Summary (Signed)
Discharge Summary   Patient ID: [name] Hinton MRN: [id number], DOB/AGE: 1970-11-15 43 y.o. Admit date: 07/28/2013 D/C date:     08/01/2013  Primary Care Provider: No primary provider on file. Primary Cardiologist: Aundra Dubin (CHF)  Primary Discharge Diagnoses:  1. Acute systolic CHF - EF 31% 2. Paroxysmal atrial fibrillation dx this admission, s/p TEE/DCCV 08/01/13 3. Paroxysmal atrial flutter dx this admission 4. 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 Dilated ascending aorta 6. Past history of alcohol abuse 7. Mild transaminitis  Hospital Course: Mr. [name] Hinton is a 43 y/o M with history of alcohol abuse who presented to [facility] complaining of progressive abdominal bloating, SOB, orthopnea, and tachycardia. CT angio was negative for PE but did show LVH and aneurysmal dilatation of the ascending aorta to 5.1 cm. He was felt to have new onset acute CHF as well as atrial flutter on admission. He was placed on cardizem and heparin drip along with Lasix. 2D echo confirmed systolic CHF with EF of 59%, diffuse hypokinesis, mod dilated LV, bicuspid AV with possible severe AI, mild MR, mod reduced RV function. Troponins remained negative. EtOH level was normal and TSH was normal. He did have mild transaminitis possibly due to his CHF vs EtOH. 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 The patient was counseled on EtOH abstinence. He was transitioned to Xarelto for anticoagulation, CHADSVASC 1 or 2 (CHF, possible HTN). Due to LV dysfunction, diltiazem was stopped and he was started on metoprolol. By 07/30/13 he was able to be changed to oral Lasix. CHF meds were gradually added this admission including ACEI and spironolactone.  EKG on 07/31/13 actually showed atrial fib with controlled ventricular response. Dr. Mare Ferrari spoke with Dr. Rayann Heman who did not recommend atrial flutter ablation at this time.  TEE/DCCV was recommended and he was transferred  to [facility]. The patient underwent this procedure today with successful restoration of NSR from atrial fib. TEE showed: Findings: Moderately dilated LV with severe systolic dysfunction, EF 45%. Global hypokinesis. Normal RV size with moderately decreased systolic function. The aortic valve was bicuspid with probably severe eccentric aortic insufficiency. The ascending aorta was dilated to 5.0 cm by TEE. Mild mitral regurgitation. No LAA thrombus.  Dr. Aundra Dubin believes the patient will likely need AVR/ascending aorta replacement + Maze. He recommended TCTS consult who will see the patient prior to discharge. He also recommended 1 month anticoagulation post-DCCV before any procedures and at that time will likely need LHC/RHC then most likely surgery. The patient will follow up closely in the CHF clinic with BMET next week. He will have repeat echo in 1 month per Dr. Aundra Dubin to reassess EF (09/01/13 at 10am). Care mgmt also will see the patient and he was given the 30-day free Xarelto card in addition to regular prescriptions. Dr. Aundra Dubin has seen and examined the patient today and feels he is stable for discharge after TCTS eval.  Discharge Vitals: Blood pressure 97/47, pulse 70, temperature 97.3 F (36.3 C), temperature source Oral, resp. rate 18, height 5\' 8"  (1.727 m), weight 150 lb 6.4 oz (68.221 kg), SpO2 100.00%.  Labs: Lab Results  Component Value Date   WBC 7.5 08/01/2013   HGB 13.5 08/01/2013   HCT 40.8 08/01/2013   MCV 90.9 08/01/2013   PLT 215 08/01/2013     Recent Labs Lab 07/30/13 0410 08/01/13 0831  NA 139 139  K 3.8 5.2  CL 94* 97  CO2 33* 31  BUN 20 28*  CREATININE 1.25 1.30  CALCIUM 9.8 9.0  PROT 6.5  --   BILITOT 1.4*  --   ALKPHOS 64  --   ALT 58*  --   AST 40*  --   GLUCOSE 160* 110*    Lab Results  Component Value Date   CHOL 140 07/28/2013   HDL 54 07/28/2013   LDLCALC 76 07/28/2013   TRIG 48 07/28/2013    Diagnostic Studies/Procedures   TEE 08/01/13 as  above  2D Echo 07/29/13 - Left ventricle: The cavity size was moderately dilated. Wall thickness was normal. The estimated ejection fraction was 25%. Diffuse hypokinesis. Indeterminant diastolic function (atrial flutter). - Aortic valve: Aortic valve leaflets mal-coapt. Bicuspid. There was no stenosis. There is very eccentric aortic insufficiency. It could be severe. Doppler flow in descending aorta is difficult to interpret due to atrial flutter. - Aorta: The aortic root is borderline dilated. The ascending aorta is not seen well. - Mitral valve: Mild regurgitation. - Left atrium: The atrium was moderately dilated. - Right ventricle: The cavity size was mildly dilated. Systolic function was moderately reduced. - Right atrium: The atrium was moderately dilated. - Tricuspid valve: Peak RV-RA gradient: 76mm Hg (S). - Pulmonary arteries: PA peak pressure: 37mm Hg (S). - Systemic veins: IVC measured 2.0 cm with < 50% respirophasic variation, suggesting RA pressure 8 mmHg. Impressions: - Moderately dilated LV with severe global hypokinesis, EF 25%. Mildly dilated RV with moderately decreased systolic function. The aortic valve is bicuspid and mal-coapts. There is significant eccentric aortic insufficiency. AI may be severe. The patient needsa TEE to assess the aortic insufficiency. The LV dilation/dysfunction could potentially be secondary to severe AI, but given co-existing RV dysfunction would be concerned for a more systemic process (ETOH cardiomyopathy).  Ct Angio Chest W/cm &/or Wo Cm 07/28/2013   CLINICAL DATA:  Short of breath, tachycardia  EXAM: CT ANGIOGRAPHY CHEST WITH CONTRAST  TECHNIQUE: Multidetector CT imaging of the chest was performed using the standard protocol during bolus administration of intravenous contrast. Multiplanar CT image reconstructions including MIPs were obtained to evaluate the vascular anatomy.  CONTRAST:  144mL OMNIPAQUE IOHEXOL 350 MG/ML SOLN  COMPARISON:  DG CHEST 1V  PORT dated 07/28/2013  FINDINGS: There are no filling defects within the pulmonary arteries to suggest acute pulmonary embolism. No acute findings of the aorta or great vessels. The ascending aorta is dilated to 5.1 cm (sagittal image 84). The left ventricle is hypertrophied. No pericardial fluid.  No axillary or supraclavicular lymphadenopathy. There is mild paratracheal lymphadenopathy lower paratracheal lymph node measuring up to 1.5 cm short axis. .  No overt pulmonary edema. There is interlobular septal thickening consistent with interstitial edema. No pleural fluid.  Limited view of the upper abdomen is unremarkable. Limited view of the skeleton unremarkable.  Review of the MIP images confirms the above findings.  It  IMPRESSION: 1. No acute pulmonary embolism. 2. Left ventricular hypertrophy advanced for age. Recommend cardiology consultation for potential heart failure in this young patient. 3. Interlobular septal thickening consistent with interstitial edema. 4. Mild paratracheal adenopathy is likely reactive. 5. 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 Recommend non emergent cardiothoracic surgery consultation.   Electronically Signed   By: Suzy Bouchard M.D.   On: 07/28/2013 16:43   Dg Chest Portable 1 View  07/28/2013   CLINICAL DATA:  Dyspnea and tachycardia  EXAM: PORTABLE CHEST - 1 VIEW  COMPARISON:  None.  FINDINGS:  The cardiopericardial silhouette is enlarged. There is mild prominence of these pulmonary vascularity with mildly increased interstitial markings bilaterally. There is no pleural effusion. There is no alveolar infiltrate. The mediastinum is normal in width. The observed portions of the bony thorax exhibit no acute abnormalities.  IMPRESSION: Enlargement of the cardiac silhouette which may reflect underlying chamber dilation or hypertrophy but a pericardial effusion is not excluded. Prominence of the pulmonary vascularity and pulmonary interstitium suggests sign rib mild  CHF.   Electronically Signed   By: David  Martinique   On: 07/28/2013 15:45    Discharge Medications     Medication List    STOP taking these medications       OVER THE COUNTER MEDICATION      TAKE these medications       furosemide 40 MG tablet  Commonly known as:  LASIX  Take 1 tablet (40 mg total) by mouth 2 (two) times daily.     lisinopril 5 MG tablet  Commonly known as:  PRINIVIL,ZESTRIL  Take 1 tablet (5 mg total) by mouth daily.     metoprolol succinate 50 MG 24 hr tablet  Commonly known as:  TOPROL-XL  Take 1 tablet (50 mg total) by mouth daily. Take with or immediately following a meal.     Rivaroxaban 20 MG Tabs tablet  Commonly known as:  XARELTO  Take 1 tablet (20 mg total) by mouth daily with supper.     spironolactone 25 MG tablet  Commonly known as:  ALDACTONE  Take 0.5 tablets (12.5 mg total) by mouth daily.        Disposition   The patient will be discharged in stable condition to home. Discharge Orders   Future Appointments Provider Department Dept Phone   08/02/2013 11:30 AM [facility] [place] [facility] [phone]   08/10/2013 9:40 AM [place] [phone]   09/01/2013 10:00 AM [facility] [place] [facility] [phone]   Future Orders Complete By Expires   Diet - low sodium heart healthy  As directed    Scheduling Instructions:     You may not drink any more alcohol. This will continue to damage your heart and put you at risk for other complications.   Discharge instructions  As directed    Comments:     One of your heart tests showed weakness of the heart muscle this admission. This may make you more susceptible to weight gain from fluid retention, which can lead to symptoms that we call heart failure. Please follow these instructions: 1. Follow a low-salt diet and watch your fluid intake. In general, you should not be  taking in more than 2 liters of fluid per day (no more than 8 glasses per day). Some patients are restricted to less than 1.5 liters of fluid per day (no more than 6 glasses per day). This includes sources of water in foods like soup, coffee, tea, milk, etc. 2. Weigh yourself on the same scale at same time of day and keep a log. 3. Call your doctor: (Anytime you feel any of the following symptoms)  - 3-4 pound weight gain in 1-2 days or 2 pounds overnight  - Shortness of breath, with or without a dry hacking cough  - Swelling in the hands, feet or stomach  - If you have to sleep on extra pillows at night in order to breathe  IT IS IMPORTANT TO LET  YOUR DOCTOR KNOW EARLY ON IF YOU ARE HAVING SYMPTOMS SO WE CAN HELP YOU!   Increase activity slowly  As directed    Scheduling Instructions:     You may return to light duty work (no heavy lifting).     Follow-up Information   Follow up with [facility]. (CHF Clinic 08/10/13 at 9:40am with labwork)    Contact information:   [place] [phone]      Follow up with [facility]. (09/01/13 at 10am - repeat heart ultrasound (echocardiogram). Check in at section A in the [facility].)    Contact information:   [place]  [phone]        Duration of Discharge Encounter: Greater than 30 minutes including physician and PA time.  Signed, Melina Copa PA-C 08/01/2013, 3:16 PM

## 2013-08-01 NOTE — Progress Notes (Addendum)
Patient ID: [name] Hinton, male   DOB: 06/06/1971, 43 y.o.   MRN: [id number]    SUBJECTIVE: Patient is now in atrial fibrillation (prior flutter) with HR in the 70s.  He feels less short of breath, now just tired.  Lasix was transitioned to po over the weekend it appears.   . furosemide  40 mg Oral BID  . lisinopril  5 mg Oral Daily  . metoprolol succinate  50 mg Oral Daily  . rivaroxaban  20 mg Oral Q supper  . sodium chloride  3 mL Intravenous Q12H  . sodium chloride  3 mL Intravenous Q12H  . spironolactone  12.5 mg Oral Daily      Filed Vitals:   07/31/13 1416 07/31/13 1842 07/31/13 2046 08/01/13 0454  BP: 108/58 119/76 102/55 103/54  Pulse: 90 89 93 91  Temp: 98.1 F (36.7 C) 98 F (36.7 C)  98.3 F (36.8 C)  TempSrc: Oral Oral  Oral  Resp: 20 18 18 18   Height:  5\' 8"  (1.727 m)    Weight:  68.4 kg (150 lb 12.7 oz)  68.221 kg (150 lb 6.4 oz)  SpO2:  100% 100% 98%    Intake/Output Summary (Last 24 hours) at 08/01/13 0734 Last data filed at 07/31/13 2010  Gross per 24 hour  Intake    240 ml  Output   1602 ml  Net  -1362 ml    LABS: Basic Metabolic Panel:  Recent Labs  07/30/13 0410  NA 139  K 3.8  CL 94*  CO2 33*  GLUCOSE 160*  BUN 20  CREATININE 1.25  CALCIUM 9.8   Liver Function Tests:  Recent Labs  07/30/13 0410  AST 40*  ALT 58*  ALKPHOS 64  BILITOT 1.4*  PROT 6.5  ALBUMIN 3.8   No results found for this basename: LIPASE, AMYLASE,  in the last 72 hours CBC:  Recent Labs  07/30/13 0410 07/31/13 0445  WBC 7.4 7.5  HGB 14.5 14.2  HCT 43.8 42.5  MCV 93.2 91.6  PLT 240 254   Cardiac Enzymes:  Recent Labs  07/29/13 0800  TROPONINI <0.30   BNP: No components found with this basename: POCBNP,  D-Dimer: No results found for this basename: DDIMER,  in the last 72 hours Hemoglobin A1C: No results found for this basename: HGBA1C,  in the last 72 hours Fasting Lipid Panel: No results found for this basename: CHOL, HDL, LDLCALC, TRIG,  CHOLHDL, LDLDIRECT,  in the last 72 hours Thyroid Function Tests: No results found for this basename: TSH, T4TOTAL, FREET3, T3FREE, THYROIDAB,  in the last 72 hours Anemia Panel: No results found for this basename: VITAMINB12, FOLATE, FERRITIN, TIBC, IRON, RETICCTPCT,  in the last 72 hours  RADIOLOGY: Ct Angio Chest W/cm &/or Wo Cm  07/28/2013   CLINICAL DATA:  Short of breath, tachycardia  EXAM: CT ANGIOGRAPHY CHEST WITH CONTRAST  TECHNIQUE: Multidetector CT imaging of the chest was performed using the standard protocol during bolus administration of intravenous contrast. Multiplanar CT image reconstructions including MIPs were obtained to evaluate the vascular anatomy.  CONTRAST:  181mL OMNIPAQUE IOHEXOL 350 MG/ML SOLN  COMPARISON:  DG CHEST 1V PORT dated 07/28/2013  FINDINGS: There are no filling defects within the pulmonary arteries to suggest acute pulmonary embolism. No acute findings of the aorta or great vessels. The ascending aorta is dilated to 5.1 cm (sagittal image 84). The left ventricle is hypertrophied. No pericardial fluid.  No axillary or supraclavicular lymphadenopathy. There is mild paratracheal lymphadenopathy  lower paratracheal lymph node measuring up to 1.5 cm short axis. .  No overt pulmonary edema. There is interlobular septal thickening consistent with interstitial edema. No pleural fluid.  Limited view of the upper abdomen is unremarkable. Limited view of the skeleton unremarkable.  Review of the MIP images confirms the above findings.  It  IMPRESSION: 1. No acute pulmonary embolism. 2. Left ventricular hypertrophy advanced for age. Recommend cardiology consultation for potential heart failure in this young patient. 3. Interlobular septal thickening consistent with interstitial edema. 4. Mild paratracheal adenopathy is likely reactive. 5. 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 Recommend non emergent cardiothoracic surgery consultation.   Electronically Signed   By:  Suzy Bouchard M.D.   On: 07/28/2013 16:43   Dg Chest Portable 1 View  07/28/2013   CLINICAL DATA:  Dyspnea and tachycardia  EXAM: PORTABLE CHEST - 1 VIEW  COMPARISON:  None.  FINDINGS: The cardiopericardial silhouette is enlarged. There is mild prominence of these pulmonary vascularity with mildly increased interstitial markings bilaterally. There is no pleural effusion. There is no alveolar infiltrate. The mediastinum is normal in width. The observed portions of the bony thorax exhibit no acute abnormalities.  IMPRESSION: Enlargement of the cardiac silhouette which may reflect underlying chamber dilation or hypertrophy but a pericardial effusion is not excluded. Prominence of the pulmonary vascularity and pulmonary interstitium suggests sign rib mild CHF.   Electronically Signed   By: David  Martinique   On: 07/28/2013 15:45    PHYSICAL EXAM General: NAD Neck: JVP 8-9 cm, no thyromegaly or thyroid nodule.  Lungs: CTAB CV: Nondisplaced PMI.  Heart regular S1/S2, no M5/Y6, 3/6 diastolic murmur along the sternal border.  No peripheral edema.  No carotid bruit.  Normal pedal pulses.  Abdomen: Soft, nontender, no hepatosplenomegaly, no distention.  Neurologic: Alert and oriented x 3.  Psych: Normal affect. Extremities: No clubbing or cyanosis.   TELEMETRY: Reviewed telemetry pt in atrial fibrillation, rate 70s  ASSESSMENT AND PLAN: 43 yo with history of ETOH abuse presented with atrial flutter/RVR and acute CHF.  1. Atrial flutter/fibrillation: Initially flutter, now fibrillation.  HR now controlled.   - He is now on Xarelto 20 mg daily and has had 3 doses.  CHADSVASC = 1 or 2 (CHF, ?HTN).   - TEE-guided DCCV today - Continue Toprol XL.  2. Acute systolic CHF: EF 50% on echo.  He is still mildly volume overloaded.  Lasix has been transitioned to po.  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  - May continue po Lasix, suspect we can gradually improve his volume.  - Continue current Toprol XL , increase lisinopril to 5 mg daily and add spironolactone 12.5 mg daily.  Needs BMET today.  - Repeat echo in 1 month to look for improvement with NSR (?tachy-mediated component).  - ETOH abstinence is mandatory.  3. Bicuspid aortic valve disorder: Patient has a bicuspid aortic valve with eccentric (?severe) aortic regurgitation and ascending aorta dilated to 5.1 cm.  With the bicuspid valve and ascending aorta dilation > 5 cm alone, he is a surgical candidate.  - TEE today for better evaluation of AI severity.  - CVTS consult: I think he is going to need AVR/ascending aorta replacement.  Would also do Maze with operation.   [name] 08/01/2013 7:34 AM  TEE-guided DCCV was successful.  The aortic insufficiency looks severe.  I think  that he will need AVR + ascending aorta replacement + Maze.  Would give him 1 month anticoagulation post-DCCV before any more procedures.  At that time, will need LHC/RHC then most likely surgery. Will have CVTS meet him.   [name] 08/01/2013 1:42 PM

## 2013-08-01 NOTE — Procedures (Signed)
Procedure: TEE  Indication: Atrial fibrillation, aortic insufficiency with bicuspid aortic valve, dilated ascending aorta  Sedation: Versed 6 mg IV, Fentanyl 75 mcg IV  Findings: Please see echo section for full report.  Moderately dilated LV with severe systolic dysfunction, EF 33%.  Global hypokinesis.  Normal RV size with moderately decreased systolic function.  The aortic valve was bicuspid with probably severe eccentric aortic insufficiency.  The ascending aorta was dilated to 5.0 cm by TEE.  Mild mitral regurgitation.  No LAA thrombus.   Proceed to DCCV.   [name] 08/01/2013 1:35 PM

## 2013-08-01 NOTE — Progress Notes (Signed)
TEE scheduled for 1pm, DCCV 1:30pm with Aundra Dubin. Official order for NPO placed thi smorning but patient reports he has not had anything to eat or drink since midnight. TCTS consult will also be placed. Alcee Sipos PA-C

## 2013-08-01 NOTE — Progress Notes (Signed)
VASCULAR LAB PRELIMINARY  PRELIMINARY  PRELIMINARY  PRELIMINARY  Pre-op Cardiac Surgery  Carotid Findings:  Bilateral:  1-39% ICA stenosis.  Vertebral artery flow is antegrade.      Upper Extremity Right Left  Brachial Pressures 124  Triphasic  118  Triphasic   Radial Waveforms Triphasic  Triphasic   Ulnar Waveforms Triphasic  Triphasic   Palmar Arch (Allen's Test) Within normal limits  Within normal limits      [name] Hinton, RVT 08/01/2013, 12:20 PM

## 2013-08-01 NOTE — Procedures (Signed)
Electrical Cardioversion Procedure Note [name] Hinton [id number] October 06, 1970  Procedure: Electrical Cardioversion Indications:  Atrial Fibrillation  Procedure Details Consent: Risks of procedure as well as the alternatives and risks of each were explained to the (patient/caregiver).  Consent for procedure obtained. Time Out: Verified patient identification, verified procedure, site/side was marked, verified correct patient position, special equipment/implants available, medications/allergies/relevent history reviewed, required imaging and test results available.  Performed  Patient placed on cardiac monitor, pulse oximetry, supplemental oxygen as necessary.  Sedation given: Propofol per anesthesiology Pacer pads placed anterior and posterior chest.  Cardioverted 1 time(s).  Cardioverted at [name] Hinton.  Evaluation Findings: Post procedure EKG shows: NSR Complications: None Patient did tolerate procedure well.   [name] Hinton 08/01/2013, 1:39 PM

## 2013-08-01 NOTE — Anesthesia Postprocedure Evaluation (Signed)
  Anesthesia Post-op Note  Patient: [name] Hinton  Procedure(s) Performed: Procedure(s): TRANSESOPHAGEAL ECHOCARDIOGRAM (TEE) (N/A) CARDIOVERSION (N/A)  Patient Location: Endoscopy Unit  Anesthesia Type:MAC  Level of Consciousness: awake  Airway and Oxygen Therapy: Patient Spontanous Breathing and Patient connected to nasal cannula oxygen  Post-op Pain: none  Post-op Assessment: Post-op Vital signs reviewed, Patient's Cardiovascular Status Stable, Respiratory Function Stable and Patent Airway  Post-op Vital Signs: Reviewed and stable  Complications: No apparent anesthesia complications

## 2013-08-01 NOTE — Consult Note (Addendum)
[address]       [place] [address]             [phone]          CARDIOTHORACIC SURGERY CONSULTATION REPORT  PCP is No primary provider on file. 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 Transthoracic echocardiogram revealed bicuspid aortic valve with severe aortic insufficiency and dilated left ventricle with severe left ventricular systolic dysfunction with ejection fraction 25%. 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 The patient's congestive heart failure improved with rate control and diuresis, and he underwent DC cardioversion earlier today, successfully converting him to sinus rhythm. Transesophageal echocardiogram performed at the time of cardioversion confirmed the presence of bicuspid aortic valve with severe aortic insufficiency. Cardiothoracic surgical consultation was requested prior to planned hospital discharge later this afternoon.  The patient states that he has been healthy and physically active all of his life. He describes a 3 month history of progressive symptoms of shortness of breath which gradually got worse until he presented last week with resting shortness of breath, chest discomfort, and orthopnea. He has never been told that he had a heart murmur in the past and he has never had any problems with physical activity or exercise intolerance. He states that over the last few months symptoms have waxed and waned. He  initially thought that may be related to alcohol, and he admits to drinking as much as 25 alcoholic drinks per week for the last 20 years. The patient also reports a history of cigarette smoking, although he quit 4 years ago. At present the patient's breathing is considerably better than it was at the time of presentation he is looking forward to going home. 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 He has been abstinent from the use of alcohol for the past 4 weeks.    Past Medical History  Diagnosis Date  . ETOH abuse   . Systolic CHF     a. Dx 07/2013: EF 25%, felt likely related to combo of EtOH + tachy-mediated + AI. LHC pending.  [name] PAF (paroxysmal atrial fibrillation)     a. Dx 07/2013, s/p TEE/DCCV.  . Paroxysmal atrial flutter     a. Dx 07/2013.  [name] Severe aortic insufficiency     a. Bicuspid aortic valve with severe AI, dilated ascending aorta dx 07/2013.    Past Surgical History  Procedure Laterality Date  . No past surgeries      Family History  Problem Relation Age of Onset  . Heart failure Father     History   Social History  . Marital Status: Single    Spouse Name: N/A    Number of Children: N/A  . Years of Education: N/A   Occupational History  . Not on file.   Social History Main Topics  . Smoking status: Former Smoker -- 1.50 packs/day for 20 years    Quit date: 09/25/2009  . Smokeless tobacco: Never Used  . Alcohol  Use: Yes     Comment: 25 drinks/week - occasional in 2 months  . Drug Use: No  . Sexual Activity: Not on file   Other Topics Concern  . Not on file   Social History Narrative  . No narrative on file    Prior to Admission medications   Medication Sig Start Date End Date Taking? Authorizing Provider  OVER THE COUNTER MEDICATION Take 1 tablet by mouth daily. Over the counter bronchiodilator to help with asthma   Yes Historical Provider, MD  furosemide (LASIX) 40 MG tablet Take 1 tablet (40 mg total) by mouth  2 (two) times daily. 08/01/13   Dayna N Dunn, PA-C  lisinopril (PRINIVIL,ZESTRIL) 5 MG tablet Take 1 tablet (5 mg total) by mouth daily. 08/01/13   Dayna N Dunn, PA-C  metoprolol succinate (TOPROL-XL) 50 MG 24 hr tablet Take 1 tablet (50 mg total) by mouth daily. Take with or immediately following a meal. 08/01/13   Dayna N Dunn, PA-C  Rivaroxaban (XARELTO) 20 MG TABS tablet Take 1 tablet (20 mg total) by mouth daily with supper. 08/01/13   Dayna N Dunn, PA-C  spironolactone (ALDACTONE) 25 MG tablet Take 0.5 tablets (12.5 mg total) by mouth daily. 08/01/13   Dayna N Dunn, PA-C    Current Facility-Administered Medications  Medication Dose Route Frequency Provider Last Rate Last Dose  . 0.9 %  sodium chloride infusion  250 mL Intravenous PRN Theodis Blaze, MD      . 0.9 %  sodium chloride infusion   Intravenous Continuous Meriel Pica, PA-C 20 mL/hr at 08/01/13 0657    . 0.9 %  sodium chloride infusion  250 mL Intravenous Continuous Dayna N Dunn, PA-C 1 mL/hr at 08/01/13 0949 250 mL at 08/01/13 0949  . furosemide (LASIX) tablet 40 mg  40 mg Oral BID Darlin Coco, MD   40 mg at 08/01/13 1739  . HYDROcodone-acetaminophen (NORCO/VICODIN) 5-325 MG per tablet 1-2 tablet  1-2 tablet Oral Q4H PRN Theodis Blaze, MD      . lisinopril (PRINIVIL,ZESTRIL) tablet 5 mg  5 mg Oral Daily [name], MD   5 mg at 08/01/13 0954  . metoprolol succinate (TOPROL-XL) 24 hr tablet 50 mg  50 mg Oral Daily Theodis Blaze, MD   50 mg at 08/01/13 0950  . ondansetron (ZOFRAN) tablet 4 mg  4 mg Oral Q6H PRN Theodis Blaze, MD       Or  . ondansetron [facility]) injection 4 mg  4 mg Intravenous Q6H PRN Theodis Blaze, MD      . Rivaroxaban Alveda Reasons) tablet 20 mg  20 mg Oral Q supper Emiliano Dyer, RPH   20 mg at 08/01/13 1739  . sodium chloride 0.9 % injection 3 mL  3 mL Intravenous Q12H Theodis Blaze, MD   3 mL at 08/01/13 0950  . sodium chloride 0.9 % injection 3 mL  3 mL Intravenous Q12H Theodis Blaze, MD   3 mL at  08/01/13 0950  . sodium chloride 0.9 % injection 3 mL  3 mL Intravenous PRN Theodis Blaze, MD      . sodium chloride 0.9 % injection 3 mL  3 mL Intravenous Q12H Dayna N Dunn, PA-C   3 mL at 08/01/13 0951  . sodium chloride 0.9 % injection 3 mL  3 mL Intravenous PRN Dayna N Dunn, PA-C      . spironolactone (ALDACTONE) tablet 12.5 mg  12.5 mg Oral Daily  [name], MD        No Known Allergies    Review of Systems:   General:  normal appetite, normal energy, no weight gain, no weight loss, no fever  Cardiac:  no chest pain with exertion, no chest pain at rest, + SOB with exertion, + resting SOB at the time of admission now improved, + PND, + orthopnea, + palpitations, + arrhythmia, + atrial fibrillation, no LE edema, no dizzy spells, no syncope  Respiratory:  + shortness of breath, no home oxygen, + productive cough, + dry cough, no bronchitis, no wheezing, no hemoptysis, no asthma, no pain with inspiration or cough, no sleep apnea, no CPAP at night  GI:   no difficulty swallowing, no reflux, no frequent heartburn, no hiatal hernia, no abdominal pain, no constipation, no diarrhea, no hematochezia, no hematemesis, no melena  GU:   no dysuria,  no frequency, no urinary tract infection, no hematuria, no enlarged prostate, no kidney stones, no kidney disease  Vascular:  no pain suggestive of claudication, no pain in feet, no leg cramps, no varicose veins, no DVT, no non-healing foot ulcer  Neuro:   no stroke, no TIA's, no seizures, no headaches, no temporary blindness one eye,  no slurred speech, no peripheral neuropathy, no chronic pain, no instability of gait, no memory/cognitive dysfunction  Musculoskeletal: no arthritis, no joint swelling, no myalgias, no difficulty walking, normal mobility   Skin:   no rash, no itching, no skin infections, no pressure sores or ulcerations  Psych:   no anxiety, no depression, no nervousness, no unusual recent stress  Eyes:   no blurry vision, no floaters,  no recent vision changes, no wears glasses or contacts  ENT:   no hearing loss, no loose or painful teeth, no dentures, last saw dentist many years ago  Hematologic:  no easy bruising, no abnormal bleeding, no clotting disorder, no frequent epistaxis  Endocrine:  no diabetes, does not check CBG's at home     Physical Exam:   BP 97/47  Pulse 70  Temp(Src) 97.3 F (36.3 C) (Oral)  Resp 18  Ht 5\' 8"  (1.727 m)  Wt 68.221 kg (150 lb 6.4 oz)  BMI 22.87 kg/m2  SpO2 100%  General:  Thin,  well-appearing  HEENT:  Unremarkable   Neck:   no JVD, no bruits, no adenopathy   Chest:   clear to auscultation, symmetrical breath sounds, no wheezes, no rhonchi   CV:   RRR, grade III/VI diastolic murmur best at RSB  Abdomen:  soft, non-tender, no masses   Extremities:  warm, well-perfused, pulses palpable  Rectal/GU  Deferred  Neuro:   Grossly non-focal and symmetrical throughout  Skin:   Clean and dry, no rashes, no breakdown  Diagnostic Tests:  Transthoracic Echocardiography  Patient: Lovel, Hee MR #: [id number] Study Date: 07/29/2013 Gender: M Age: 43 Height: 172.7cm Weight: 70.3kg BSA: 1.54m^2 Pt. Status: Room: [place] Michel Bickers, [place] ATTENDING Doyle Askew, [place] Gaylene Brooks, M.D. PERFORMING [facility], Inpatient cc:  ------------------------------------------------------------ LV EF: 25%  ------------------------------------------------------------ Indications: CHF - 428.0.  ------------------------------------------------------------ History: PMH: History of ETOH abuse. Atrial flutter. Acute heart failure. Dyspnea.  ------------------------------------------------------------ Study Conclusions  - Left ventricle: The cavity size was moderately dilated. Wall thickness was normal. The estimated ejection fraction was 25%. Diffuse hypokinesis. Indeterminant diastolic function (atrial flutter). - Aortic valve: Aortic valve leaflets  mal-coapt. Bicuspid. There was no stenosis. There is very eccentric aortic insufficiency. It could be severe. Doppler flow  in descending aorta is difficult to interpret due to atrial flutter. - Aorta: The aortic root is borderline dilated. The ascending aorta is not seen well. - Mitral valve: Mild regurgitation. - Left atrium: The atrium was moderately dilated. - Right ventricle: The cavity size was mildly dilated. Systolic function was moderately reduced. - Right atrium: The atrium was moderately dilated. - Tricuspid valve: Peak RV-RA gradient: 46mm Hg (S). - Pulmonary arteries: PA peak pressure: 33mm Hg (S). - Systemic veins: IVC measured 2.0 cm with < 50% respirophasic variation, suggesting RA pressure 8 mmHg. Impressions:  - Moderately dilated LV with severe global hypokinesis, EF 25%. Mildly dilated RV with moderately decreased systolic function. The aortic valve is bicuspid and mal-coapts. There is significant eccentric aortic insufficiency. AI may be severe. The patient needsa TEE to assess the aortic insufficiency. The LV dilation/dysfunction could potentially be secondary to severe AI, but given co-existing RV dysfunction would be concerned for a more systemic process (ETOH cardiomyopathy). Transthoracic echocardiography. M-mode, complete 2D, spectral Doppler, and color Doppler. Height: Height: 172.7cm. Height: 68in. Weight: Weight: 70.3kg. Weight: 154.7lb. Body mass index: BMI: 23.6kg/m^2. Body surface area: BSA: 1.65m^2. Blood pressure: 103/57. Patient status: Inpatient. Location: Bedside.  ------------------------------------------------------------  ------------------------------------------------------------ Left ventricle: The cavity size was moderately dilated. Wall thickness was normal. The estimated ejection fraction was 25%. Diffuse hypokinesis. Indeterminant diastolic function (atrial  flutter).  ------------------------------------------------------------ Aortic valve: Aortic valve leaflets mal-coapt. Bicuspid. Doppler: There was no stenosis. There is very eccentric aortic insufficiency. It could be severe. Doppler flow in descending aorta is difficult to interpret due to atrial flutter.  ------------------------------------------------------------ Aorta: The aortic root is borderline dilated. The ascending aorta is not seen well.  ------------------------------------------------------------ Mitral valve: Normal thickness leaflets . Doppler: There was no evidence for stenosis. Mild regurgitation.  ------------------------------------------------------------ Left atrium: The atrium was moderately dilated.  ------------------------------------------------------------ Right ventricle: The cavity size was mildly dilated. Systolic function was moderately reduced.  ------------------------------------------------------------ Pulmonic valve: Structurally normal valve. Cusp separation was normal. Doppler: Transvalvular velocity was within the normal range. No regurgitation.  ------------------------------------------------------------ Tricuspid valve: Doppler: Trivial regurgitation.  ------------------------------------------------------------ Right atrium: The atrium was moderately dilated.  ------------------------------------------------------------ Pericardium: There was no pericardial effusion.  ------------------------------------------------------------ Systemic veins: IVC measured 2.0 cm with < 50% respirophasic variation, suggesting RA pressure 8 mmHg.  ------------------------------------------------------------ Post procedure conclusions Ascending Aorta:  - The aortic root is borderline dilated. The ascending aorta is not seen well.  ------------------------------------------------------------  2D measurements Normal Doppler measurements  Normal Left ventricle Main pulmonary LVID ED, 67.9 mm 43-52 artery chord, Pressure, S 30 mm =30 PLAX Hg LVID ES, 63.1 mm 23-38 Left ventricle chord, Ea, lat ann, 7.29 cm/s ------ PLAX tiss DP FS, chord, 7 % >29 Tricuspid valve PLAX Regurg peak 235 cm/s ------ LVPW, ED 14.9 mm ------ vel IVS/LVPW 0.95 <1.3 Peak RV-RA 22 mm ------ ratio, ED gradient, S Hg Ventricular septum Max regurg 235 cm/s ------ IVS, ED 14.1 mm ------ vel Aorta Right ventricle Root diam, 37 mm ------ Pressure, S 27 mm <30 ED Hg Left atrium Sa vel, lat 7.07 cm/s ------ AP dim 49 mm ------ ann, tiss DP AP dim 2.66 cm/m^2 <2.2 index  ------------------------------------------------------------ Prepared and Electronically Authenticated by  [name] [id number]   Procedure: TEE  Indication: Atrial fibrillation, aortic insufficiency with bicuspid aortic valve, dilated ascending aorta  Sedation: Versed 6 mg IV, Fentanyl 75 mcg IV  Findings: Please see echo section for full report. Moderately dilated LV with severe systolic dysfunction, EF 16%. Global hypokinesis. Normal  RV size with moderately decreased systolic function. The aortic valve was bicuspid with probably severe eccentric aortic insufficiency. The ascending aorta was dilated to 5.0 cm by TEE. Mild mitral regurgitation. No LAA thrombus.  Proceed to DCCV.  [name]  08/01/2013  1:35 PM     CT ANGIOGRAPHY CHEST WITH CONTRAST  TECHNIQUE:  Multidetector CT imaging of the chest was performed using the  standard protocol during bolus administration of intravenous  contrast. Multiplanar CT image reconstructions including MIPs were  obtained to evaluate the vascular anatomy.  CONTRAST: 166mL OMNIPAQUE IOHEXOL 350 MG/ML SOLN  COMPARISON: DG CHEST 1V PORT dated 07/28/2013  FINDINGS:  There are no filling defects within the pulmonary arteries to  suggest acute pulmonary embolism. No acute findings of the aorta or  great vessels. The  ascending aorta is dilated to 5.1 cm (sagittal  image 84). The left ventricle is hypertrophied. No pericardial  fluid.  No axillary or supraclavicular lymphadenopathy. There is mild  paratracheal lymphadenopathy lower paratracheal lymph node measuring  up to 1.5 cm short axis. .  No overt pulmonary edema. There is interlobular septal thickening  consistent with interstitial edema. No pleural fluid.  Limited view of the upper abdomen is unremarkable. Limited view of  the skeleton unremarkable.  Review of the MIP images confirms the above findings. It  IMPRESSION:  1. No acute pulmonary embolism.  2. Left ventricular hypertrophy advanced for age. Recommend  cardiology consultation for potential heart failure in this young  patient.  3. Interlobular septal thickening consistent with interstitial  edema.  4. Mild paratracheal adenopathy is likely reactive.  5. 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 Recommend  non emergent cardiothoracic surgery consultation.  Electronically Signed  By: Suzy Bouchard M.D.  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  The patient has severe global left ventricular chamber enlargement and systolic dysfunction with ejection fraction estimated 25% at the time of presentation several days ago. At that time the patient was in atrial flutter, and he has now been successfully cardioverted back to sinus rhythm.  He will require aortic root replacement, and I do not feel that there is a role for valve sparing procedure. Given the patient's relatively young age a pulmonary autograft aortic root replacement (Ross procedure) could be considered, although this might be associated with suboptimal long-term outcome in the setting of severe aortic  insufficiency with dilated aortic root.  Finally, concomitant Maze procedure might be reasonable under the circumstances to decrease the patient's risk of future recurrence of atrial fibrillation or atrial flutter. Given that the patient seems to be dramatically improved from a symptomatic standpoint, it may be best to delay surgery for least 4 weeks so that anticoagulation can be held with decreased risk of stroke.  In addition, risks of surgery may be slightly increased because of the patient's history of significant alcohol consumption.  The longer he stays off of alcohol prior to surgery the better, and it will be important to make certain that he will remain compliant, particularly if his valve is going to be replaced using a mechanical prosthesis.  In the short term I am a bit concerned about the use of Xarelto for anticoagulation because of concerns related to hepatic clearance.  Liver enzymes were slightly abnormal during this hospitalization, presumably as a result  of passive hepatic congestion but potentially also related to the patient's long history of heavy alcohol use.  However, as long as Xarelto can be stopped well in advance of surgery this may be reasonable for the interim period of time.  Finally, the patient needs dental service consultation prior to surgery.   Plan:  I discussed matters at length with the patient and his father in his hospital room this afternoon. We briefly discussed surgical alternatives, although we will plan to discuss these matters further when he returns for outpatient consultation. As long as he remains clinically stable I agree with plans to follow him carefully as an outpatient and reassess left ventricular function by echocardiogram in 4-6 weeks. At some he will need diagnostic left and right heart catheterization.  Cardiac gated CT angiogram of the heart could be utilized as an alternative to evaluate his coronary artery anatomy, but right heart catheterization  might prove to be important under the circumstances, particularly if his left ventricular systolic function does not show significant improvement following cardioversion.  I will arrange for followup consultation in my office and referral to the dental service clinic.  All of the patient's questions been addressed.    Valentina Gu. Roxy Manns, MD 08/01/2013 6:10 PM  I spent in excess of 90 minutes of time directly involved in the conduct of this consultation.

## 2013-08-01 NOTE — Care Management Note (Addendum)
  Page 2 of 2   08/01/2013     4:33:19 PM   CARE MANAGEMENT NOTE 08/01/2013  Patient:  [name] Hinton,[name] Hinton   Account Number:  [id number]  Date Initiated:  07/29/2013  Documentation initiated by:  Gabriel Earing  Subjective/Objective Assessment:   pt admitted with cco SOB, found to be Atrial flutter, CHF     Action/Plan:   from home   Anticipated DC Date:  07/31/2013   Anticipated DC Plan:  [place]  CM consult      Choice offered to / List presented to:             Status of service:  Completed, signed off Medicare Important Message given?  NA - LOS <3 / Initial given by admissions (If response is "NO", the following Medicare IM given date fields will be blank) Date Medicare IM given:   Date Additional Medicare IM given:    Discharge Disposition:  HOME/SELF CARE  Per UR Regulation:  Reviewed for med. necessity/level of care/duration of stay  If discussed at Long of Stay Meetings, dates discussed:    Comments:  08/01/2013 Last UR Date 07/29/2013 CM Consult:  Medication Assistance. Social:  Lives alone Support System:  yes Transportation:  Yes - owns car and drives self CM provided VE on pharmacy $4.00 list and provided with hard copy. furosemide 40 MG tablet lisinopril 5 MG tablet metoprolol succinate 50 MG 24 hr tablet Rivaroxaban 20 MG Tabs tablet  (XARELTO)  (CM provided Xarelto discount card) spironolactone 25 MG tablet Patient confirms he uses computer/internet service and can research $4.00 list and is able to afford this co-pay. CM instructed on how to locate PCP and provided PCP assistance #. CM encouraged to go ahead and contact [employer] for a list of in-network providers / PCPs to initiate PCP care. CM instructed to keep all MD appts scheduled by d/c MD until new PCP established. Darious Rehman RN, BSN, [place], CCM f1/06/2014   07/29/13 MMcGibboney, RN, BSN Chart reviewed.

## 2013-08-01 NOTE — Interval H&P Note (Signed)
History and Physical Interval Note:  08/01/2013 1:07 PM  [name] Hinton  has presented today for surgery, with the diagnosis of a-fib  The various methods of treatment have been discussed with the patient and family. After consideration of risks, benefits and other options for treatment, the patient has consented to  Procedure(s): TRANSESOPHAGEAL ECHOCARDIOGRAM (TEE) (N/A) CARDIOVERSION (N/A) as a surgical intervention .  The patient's history has been reviewed, patient examined, no change in status, stable for surgery.  I have reviewed the patient's chart and labs.  Questions were answered to the patient's satisfaction.     Milton Streicher [employer]

## 2013-08-01 NOTE — Transfer of Care (Signed)
Immediate Anesthesia Transfer of Care Note  Patient: [name] Hinton  Procedure(s) Performed: Procedure(s): TRANSESOPHAGEAL ECHOCARDIOGRAM (TEE) (N/A) CARDIOVERSION (N/A)  Patient Location: Endoscopy Unit  Anesthesia Type:MAC  Level of Consciousness: awake  Airway & Oxygen Therapy: Patient Spontanous Breathing and Patient connected to nasal cannula oxygen  Post-op Assessment: Report given to PACU RN and Post -op Vital signs reviewed and stable  Post vital signs: Reviewed and stable  Complications: No apparent anesthesia complications

## 2013-08-01 NOTE — Anesthesia Preprocedure Evaluation (Addendum)
Anesthesia Evaluation  Patient identified by MRN, date of birth, ID band Patient awake    Reviewed: Allergy & Precautions, H&P , NPO status , Patient's Chart, lab work & pertinent test results, reviewed documented beta blocker date and time   Airway Mallampati: II TM Distance: >3 FB Neck ROM: Full    Dental  (+) Teeth Intact and Dental Advisory Given   Pulmonary former smoker,          Cardiovascular +CHF + dysrhythmias Atrial Fibrillation     Neuro/Psych    GI/Hepatic   Endo/Other    Renal/GU      Musculoskeletal   Abdominal   Peds  Hematology   Anesthesia Other Findings   Reproductive/Obstetrics                         Anesthesia Physical Anesthesia Plan  ASA: III  Anesthesia Plan: General   Post-op Pain Management:    Induction: Intravenous  Airway Management Planned: Mask  Additional Equipment:   Intra-op Plan:   Post-operative Plan:   Informed Consent: I have reviewed the patients History and Physical, chart, labs and discussed the procedure including the risks, benefits and alternatives for the proposed anesthesia with the patient or authorized representative who has indicated his/her understanding and acceptance.   Dental advisory given  Plan Discussed with: CRNA and Surgeon  Anesthesia Plan Comments:        Anesthesia Quick Evaluation

## 2013-08-01 NOTE — Progress Notes (Signed)
  Echocardiogram Echocardiogram Transesophageal has been performed.  [name] Hinton 08/01/2013, 2:01 PM

## 2013-08-01 NOTE — Progress Notes (Signed)
Per Dr.McLean, pt OK to be discharged after CVTS consult today. (I spoke with Dr. Roxy Manns earlier regarding this patient.) F/u scheduled 08/10/13 at 9:40am in CHF clinic with BMET that day. Med recs per Dr. Claris Gladden rounding note today, including Lasix 40mg  BID at discharge. Amey Hossain PA-C

## 2013-08-01 NOTE — Progress Notes (Signed)
ANTICOAGULATION CONSULT NOTE - Follow Up Consult  Pharmacy Consult:  Xarelto Indication:  Aflutter  No Known Allergies  Patient Measurements: Height: 5\' 8"  (172.7 cm) Weight: 150 lb 6.4 oz (68.221 kg) (c scale) IBW/kg (Calculated) : 68.4  Vital Signs: Temp: 98.2 F (36.8 C) (01/12 1246) Temp src: Oral (01/12 1246) BP: 122/53 mmHg (01/12 1246) Pulse Rate: 91 (01/12 0454)  Labs:  Recent Labs  07/30/13 0410 07/31/13 0445 08/01/13 0831  HGB 14.5 14.2 13.5  HCT 43.8 42.5 40.8  PLT 240 254 215  CREATININE 1.25  --  1.30    Estimated Creatinine Clearance: 72.9 ml/min (by C-G formula based on Cr of 1.3).     Assessment: 56 YOM with Aflutter to continue on Xarelto.  Patient's renal function and CBC are stable.  No bleeding reported.   Goal of Therapy:  Full anticoagulation Monitor platelets by anticoagulation protocol: Yes    Plan:  - Xarelto 20mg  PO daily with supper - Education provided - Pharmacy will sign off and follow peripherally    Florabel Faulks D. Mina Marble, PharmD, BCPS Pager:  [phone] 08/01/2013, 1:01 PM

## 2013-08-01 NOTE — H&P (View-Only) (Signed)
Patient ID: [name] Hinton, male   DOB: 06/06/1971, 43 y.o.   MRN: [id number]    SUBJECTIVE: Patient is now in atrial fibrillation (prior flutter) with HR in the 70s.  He feels less short of breath, now just tired.  Lasix was transitioned to po over the weekend it appears.   . furosemide  40 mg Oral BID  . lisinopril  5 mg Oral Daily  . metoprolol succinate  50 mg Oral Daily  . rivaroxaban  20 mg Oral Q supper  . sodium chloride  3 mL Intravenous Q12H  . sodium chloride  3 mL Intravenous Q12H  . spironolactone  12.5 mg Oral Daily      Filed Vitals:   07/31/13 1416 07/31/13 1842 07/31/13 2046 08/01/13 0454  BP: 108/58 119/76 102/55 103/54  Pulse: 90 89 93 91  Temp: 98.1 F (36.7 C) 98 F (36.7 C)  98.3 F (36.8 C)  TempSrc: Oral Oral  Oral  Resp: 20 18 18 18   Height:  5\' 8"  (1.727 m)    Weight:  68.4 kg (150 lb 12.7 oz)  68.221 kg (150 lb 6.4 oz)  SpO2:  100% 100% 98%    Intake/Output Summary (Last 24 hours) at 08/01/13 0734 Last data filed at 07/31/13 2010  Gross per 24 hour  Intake    240 ml  Output   1602 ml  Net  -1362 ml    LABS: Basic Metabolic Panel:  Recent Labs  07/30/13 0410  NA 139  K 3.8  CL 94*  CO2 33*  GLUCOSE 160*  BUN 20  CREATININE 1.25  CALCIUM 9.8   Liver Function Tests:  Recent Labs  07/30/13 0410  AST 40*  ALT 58*  ALKPHOS 64  BILITOT 1.4*  PROT 6.5  ALBUMIN 3.8   No results found for this basename: LIPASE, AMYLASE,  in the last 72 hours CBC:  Recent Labs  07/30/13 0410 07/31/13 0445  WBC 7.4 7.5  HGB 14.5 14.2  HCT 43.8 42.5  MCV 93.2 91.6  PLT 240 254   Cardiac Enzymes:  Recent Labs  07/29/13 0800  TROPONINI <0.30   BNP: No components found with this basename: POCBNP,  D-Dimer: No results found for this basename: DDIMER,  in the last 72 hours Hemoglobin A1C: No results found for this basename: HGBA1C,  in the last 72 hours Fasting Lipid Panel: No results found for this basename: CHOL, HDL, LDLCALC, TRIG,  CHOLHDL, LDLDIRECT,  in the last 72 hours Thyroid Function Tests: No results found for this basename: TSH, T4TOTAL, FREET3, T3FREE, THYROIDAB,  in the last 72 hours Anemia Panel: No results found for this basename: VITAMINB12, FOLATE, FERRITIN, TIBC, IRON, RETICCTPCT,  in the last 72 hours  RADIOLOGY: Ct Angio Chest W/cm &/or Wo Cm  07/28/2013   CLINICAL DATA:  Short of breath, tachycardia  EXAM: CT ANGIOGRAPHY CHEST WITH CONTRAST  TECHNIQUE: Multidetector CT imaging of the chest was performed using the standard protocol during bolus administration of intravenous contrast. Multiplanar CT image reconstructions including MIPs were obtained to evaluate the vascular anatomy.  CONTRAST:  181mL OMNIPAQUE IOHEXOL 350 MG/ML SOLN  COMPARISON:  DG CHEST 1V PORT dated 07/28/2013  FINDINGS: There are no filling defects within the pulmonary arteries to suggest acute pulmonary embolism. No acute findings of the aorta or great vessels. The ascending aorta is dilated to 5.1 cm (sagittal image 84). The left ventricle is hypertrophied. No pericardial fluid.  No axillary or supraclavicular lymphadenopathy. There is mild paratracheal lymphadenopathy  lower paratracheal lymph node measuring up to 1.5 cm short axis. .  No overt pulmonary edema. There is interlobular septal thickening consistent with interstitial edema. No pleural fluid.  Limited view of the upper abdomen is unremarkable. Limited view of the skeleton unremarkable.  Review of the MIP images confirms the above findings.  It  IMPRESSION: 1. No acute pulmonary embolism. 2. Left ventricular hypertrophy advanced for age. Recommend cardiology consultation for potential heart failure in this young patient. 3. Interlobular septal thickening consistent with interstitial edema. 4. Mild paratracheal adenopathy is likely reactive. 5. 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 Recommend non emergent cardiothoracic surgery consultation.   Electronically Signed   By:  Suzy Bouchard M.D.   On: 07/28/2013 16:43   Dg Chest Portable 1 View  07/28/2013   CLINICAL DATA:  Dyspnea and tachycardia  EXAM: PORTABLE CHEST - 1 VIEW  COMPARISON:  None.  FINDINGS: The cardiopericardial silhouette is enlarged. There is mild prominence of these pulmonary vascularity with mildly increased interstitial markings bilaterally. There is no pleural effusion. There is no alveolar infiltrate. The mediastinum is normal in width. The observed portions of the bony thorax exhibit no acute abnormalities.  IMPRESSION: Enlargement of the cardiac silhouette which may reflect underlying chamber dilation or hypertrophy but a pericardial effusion is not excluded. Prominence of the pulmonary vascularity and pulmonary interstitium suggests sign rib mild CHF.   Electronically Signed   By: David  Martinique   On: 07/28/2013 15:45    PHYSICAL EXAM General: NAD Neck: JVP 8-9 cm, no thyromegaly or thyroid nodule.  Lungs: CTAB CV: Nondisplaced PMI.  Heart regular S1/S2, no I3/K7, 3/6 diastolic murmur along the sternal border.  No peripheral edema.  No carotid bruit.  Normal pedal pulses.  Abdomen: Soft, nontender, no hepatosplenomegaly, no distention.  Neurologic: Alert and oriented x 3.  Psych: Normal affect. Extremities: No clubbing or cyanosis.   TELEMETRY: Reviewed telemetry pt in atrial fibrillation, rate 70s  ASSESSMENT AND PLAN: 43 yo with history of ETOH abuse presented with atrial flutter/RVR and acute CHF.  1. Atrial flutter/fibrillation: Initially flutter, now fibrillation.  HR now controlled.   - He is now on Xarelto 20 mg daily and has had 3 doses.  CHADSVASC = 1 or 2 (CHF, ?HTN).   - TEE-guided DCCV today - Continue Toprol XL.  2. Acute systolic CHF: EF 42% on echo.  He is still mildly volume overloaded.  Lasix has been transitioned to po.  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  - May continue po Lasix, suspect we can gradually improve his volume.  - Continue current Toprol XL , increase lisinopril to 5 mg daily and add spironolactone 12.5 mg daily.  Needs BMET today.  - Repeat echo in 1 month to look for improvement with NSR (?tachy-mediated component).  - ETOH abstinence is mandatory.  3. Bicuspid aortic valve disorder: Patient has a bicuspid aortic valve with eccentric (?severe) aortic regurgitation and ascending aorta dilated to 5.1 cm.  With the bicuspid valve and ascending aorta dilation > 5 cm alone, he is a surgical candidate.  - TEE today for better evaluation of AI severity.  - CVTS consult: I think he is going to need AVR/ascending aorta replacement.  Would also do Maze with operation.   [name] 08/01/2013 7:34 AM

## 2013-08-02 ENCOUNTER — Encounter (HOSPITAL_COMMUNITY): Payer: Self-pay | Admitting: Cardiology

## 2013-08-02 ENCOUNTER — Encounter (HOSPITAL_COMMUNITY): Payer: Self-pay

## 2013-08-03 ENCOUNTER — Telehealth (HOSPITAL_COMMUNITY): Payer: Self-pay

## 2013-08-03 NOTE — Telephone Encounter (Signed)
08/03/2013  Called to scheduled dental consult and patient refused due to feeling well due to medications. Patient will call back to schedule dental consult when patient is feeling better. LRI

## 2013-08-05 ENCOUNTER — Telehealth (HOSPITAL_COMMUNITY): Payer: Self-pay | Admitting: Cardiology

## 2013-08-05 ENCOUNTER — Encounter (HOSPITAL_COMMUNITY): Payer: Self-pay | Admitting: Anesthesiology

## 2013-08-05 DIAGNOSIS — I5030 Unspecified diastolic (congestive) heart failure: Secondary | ICD-10-CM

## 2013-08-05 DIAGNOSIS — I509 Heart failure, unspecified: Secondary | ICD-10-CM

## 2013-08-05 MED ORDER — LOSARTAN POTASSIUM 25 MG PO TABS: 25.0000 mg | ORAL_TABLET | Freq: Every day | ORAL | Status: DC

## 2013-08-05 NOTE — Addendum Note (Signed)
Addended by: Kerry Dory on: 08/05/2013 05:21 PM   Modules accepted: Orders

## 2013-08-05 NOTE — Addendum Note (Signed)
Addended by: Kerry Dory on: 08/05/2013 05:19 PM   Modules accepted: Orders

## 2013-08-05 NOTE — Progress Notes (Signed)
This encounter was created in error - please disregard.

## 2013-08-05 NOTE — Telephone Encounter (Signed)
Mother called with multiple concerns regarding cough and pt unable to rest at night   Per vo Deatra Canter [place],NP/ [name] MD D/c lisinopril  Start losartan  25mg  one po daily  Keep follow up as scheduled

## 2013-08-05 NOTE — Addendum Note (Signed)
Addended by: Kerry Dory on: 08/05/2013 04:34 PM   Modules accepted: Orders

## 2013-08-10 ENCOUNTER — Ambulatory Visit (HOSPITAL_COMMUNITY)
Admit: 2013-08-10 | Discharge: 2013-08-10 | Disposition: A | Discharge status: Home / Self Care | Payer: Self-pay | Attending: Cardiology | Admitting: Cardiology

## 2013-08-10 ENCOUNTER — Telehealth (HOSPITAL_COMMUNITY): Payer: Self-pay | Admitting: Adult Health

## 2013-08-10 VITALS — BP 120/58 | HR 79 | Wt 152.0 lb

## 2013-08-10 DIAGNOSIS — I4891 Unspecified atrial fibrillation: Secondary | ICD-10-CM

## 2013-08-10 DIAGNOSIS — I5022 Chronic systolic (congestive) heart failure: Secondary | ICD-10-CM

## 2013-08-10 DIAGNOSIS — I509 Heart failure, unspecified: Secondary | ICD-10-CM

## 2013-08-10 DIAGNOSIS — Q231 Congenital insufficiency of aortic valve: Secondary | ICD-10-CM

## 2013-08-10 DIAGNOSIS — I712 Thoracic aortic aneurysm, without rupture, unspecified: Secondary | ICD-10-CM

## 2013-08-10 DIAGNOSIS — I351 Nonrheumatic aortic (valve) insufficiency: Secondary | ICD-10-CM

## 2013-08-10 DIAGNOSIS — I480 Paroxysmal atrial fibrillation: Secondary | ICD-10-CM | POA: Insufficient documentation

## 2013-08-10 DIAGNOSIS — I359 Nonrheumatic aortic valve disorder, unspecified: Secondary | ICD-10-CM

## 2013-08-10 DIAGNOSIS — Q2381 Bicuspid aortic valve: Secondary | ICD-10-CM

## 2013-08-10 DIAGNOSIS — I4892 Unspecified atrial flutter: Secondary | ICD-10-CM

## 2013-08-10 LAB — BASIC METABOLIC PANEL
BUN: 29 mg/dL — ABNORMAL HIGH (ref 6–23)
CO2: 26 mEq/L (ref 19–32)
Calcium: 9 mg/dL (ref 8.4–10.5)
Chloride: 98 mEq/L (ref 96–112)
Creatinine, Ser: 1.17 mg/dL (ref 0.50–1.35)
GFR calc Af Amer: 89 mL/min — ABNORMAL LOW (ref >90)
GFR calc non Af Amer: 77 mL/min — ABNORMAL LOW (ref >90)
Glucose, Bld: 125 mg/dL — ABNORMAL HIGH (ref 70–99)
Potassium: 5.2 mEq/L (ref 3.7–5.3)
Sodium: 139 mEq/L (ref 137–147)

## 2013-08-10 LAB — PRO B NATRIURETIC PEPTIDE: Pro B Natriuretic peptide (BNP): 2321 pg/mL — ABNORMAL HIGH (ref 0–125)

## 2013-08-10 MED ORDER — FUROSEMIDE 40 MG PO TABS: 40.0000 mg | ORAL_TABLET | Freq: Every day | ORAL | Status: DC

## 2013-08-10 MED ORDER — LOSARTAN POTASSIUM 25 MG PO TABS: 25.0000 mg | ORAL_TABLET | Freq: Two times a day (BID) | ORAL | Status: DC

## 2013-08-10 MED ORDER — METOPROLOL SUCCINATE ER 25 MG PO TB24: 75.0000 mg | ORAL_TABLET | Freq: Every day | ORAL | Status: DC

## 2013-08-10 NOTE — Progress Notes (Signed)
Patient ID: [name] Hinton, male   DOB: 1971/06/30, 43 y.o.   MRN: [id number]  Weight Range  150-153 pounds   Baseline proBNP     HPI: [name] Hinton is a 43 year old with a history of alcohol abuse and former smoker -quit  2011. Hospitalized earlier this month with increased dyspnea-->ECHO performed and showed EF 25% Global hypokinesis RV mildly dilated aortic insufficiency, new onset Acute Heart Failure and A-Flutter. Started on cardizem drip and Xarelto. Initially in A flutter-->A fib.   He failed to convert and later had TEE and DC-CV.  Dr Roxy Manns provided consultations due to aneurysm and bicuspid valve. He was discharged on ace, bb, spiro, and xarelto. Discharge weight 150 pounds.    He returns for post hospital follow up. Prior to this visit lisinopril was stopped due to a cough and he started on losartan. Cough has improved. Denies SOB/PND/Orthopnea/dizzness.  Able to walk up steps. Compliant with medications. Weight at ome 147-148 pounds. Drinking  2 liters per day and low salt . Able to walk 3 1/2 miles.   Labs (1/15): K 5.2, creatinine 1.3, AST 40, ALT 58  ECG: NSR, LVH with repolarization abnormality  PMH:  1. A fib/Aflutter: Initially flutter during 1/15 admission, degenerated to fibrillation --> S/P TEE DC-CV to NSR.  2. 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  TEE (1/15) with EF 25%, moderate LV dilation, bicuspid aortic valve with severe eccentric AI and no AS, ascending aorta 5.0 cm, moderately decreased RV systolic function.  CTA chest (1/15) with 5.1 cm ascending aortic aneurysm.  3. Cardiomyopathy with systolic CHF: As above, TEE with EF 25% and moderately dilated LV, moderately decreased RV systolic function.  Tachy-mediated CMP versus ETOH CMP versus long-standing AI versus a combination of the three.  4. Former smoker quit 2011 5. Alcohol abuse.  6. ACEI cough  FH: Father- Heart Failure, Grandfather CAD CABG    SH: Quit smoking 2011. Heavy ETOH until 1/15.  Single, works as Chief Operating Officer.  ROS: All systems negative except as listed in HPI, PMH and Problem List.  Current Outpatient Prescriptions  Medication Sig Dispense Refill  . furosemide (LASIX) 40 MG tablet Take 1 tablet (40 mg total) by mouth 2 (two) times daily.  60 tablet  3  . losartan (COZAAR) 25 MG tablet Take 1 tablet (25 mg total) by mouth daily.  30 tablet  3  . metoprolol succinate (TOPROL-XL) 50 MG 24 hr tablet Take 1 tablet (50 mg total) by mouth daily. Take with or immediately following a meal.  30 tablet  3  . Rivaroxaban (XARELTO) 20 MG TABS tablet Take 1 tablet (20 mg total) by mouth daily with supper.  30 tablet  2  . spironolactone (ALDACTONE) 25 MG tablet Take 0.5 tablets (12.5 mg total) by mouth daily.  30 tablet  3   No current facility-administered medications for this encounter.     PHYSICAL EXAM: Filed Vitals:   08/10/13 0948  BP: 120/58  Pulse: 79  Weight: 152 lb (68.947 kg)  SpO2: 99%    General:  Well appearing. No resp difficulty. Mom present  HEENT: normal Neck: supple. JVP flat. Carotids 2+ bilaterally; no bruits. No lymphadenopathy or thryomegaly appreciated. Cor: PMI normal. Regular rate & rhythm. No rubs, gallops.  3/6 diastolic murmur along the left sternal border. Lungs: clear Abdomen: soft, nontender, nondistended. No hepatosplenomegaly. No bruits or masses. Good bowel sounds. Extremities: no cyanosis, clubbing, rash, edema Neuro: alert &  orientedx3, cranial nerves grossly intact. Moves all 4 extremities w/o difficulty. Affect pleasant.  ASSESSMENT & PLAN: 1. Chronic systolic CHF: Suspect nonischemic cardiomyopathy.  Tachy-mediated CMP versus ETOH CMP versus long-standing aortic insufficiency or (most likely) a combination of all 3 possible causes.  He is no longer drinking and is back in NSR.  NYHA I-II. Doing well, able to walk 3 1/2 miles at a time.  Looks euvolemic on exam.  - Can decrease Lasix  to 40 mg once daily.  - Continue spironolactone. - Increase  Toprol XL to 75 mg daily.  - No ace due to cough. Increase losartan to 25 mg twice a day if K is not elevated on labs drawn today.  - Repeat ECHO 09/01/13: hope to see EF improvement now that he is no longer drinking and is back in NSR. - Plan pre-op RHC/LHC after he has been on Xarelto for 1 month post-DCCV.  - Reinforced daily weights, low salt food choices, and limiting fluid intake to < 2 liters per day.  - Check BMET/Pro BNP  2. Paroxysmal atrial fibrillation/flutter: s/p TEE/DCCV 08/01/13 . On Xarelto.  He remains in NSR.  It is possible that his cardiomyopathy is at least partly tachycardia-mediated.  3. Bicuspid aortic valve disorder with severe AI and severely dilated ascending aorta (5.1 cm on CTA).  He will need AVR + ascending aorta replacement.  Would like him to have Maze at the time of surgery as well for atrial fibrillation prevention.  4. ETOH abuse: He is no longer drinking since discharge from hospital.  Follow up in 1 month.   CLEGG,AMYNP-C 9:51 AM  Patient seen with NP, agree with the above note. I have made appropriate changes.   Medication adjustments as above. Will need AVR + ascending aorta replacement and hopefully Maze.  Will continue anticoagulation post-DCCV x 1 month prior to any interruption.  After that time, plan LHC/RHC followed by repeat echo and surgery.   [name] 08/10/2013

## 2013-08-10 NOTE — Patient Instructions (Addendum)
Follow up in February 12 if possible after his ECHO  Take Toprol XL 75 mg once a day  Take 25 mg losartan twice a day  Take lasix 40 mg daily   Do the following things EVERYDAY: 1) Weigh yourself in the morning before breakfast. Write it down and keep it in a log. 2) Take your medicines as prescribed 3) Eat low salt foods-Limit salt (sodium) to 2000 mg per day.  4) Stay as active as you can everyday 5) Limit all fluids for the day to less than 2 liters

## 2013-08-10 NOTE — Telephone Encounter (Signed)
   Provided lab result K 5.2   I have asked him to continue losartan once a day and not increase to twice a day as previously requested at his appointment earlier today.   [name] Hinton verbalized understanding.   Ercel Pepitone NP-C 3:27 PM

## 2013-08-11 ENCOUNTER — Encounter (HOSPITAL_COMMUNITY): Payer: Self-pay | Admitting: Dentistry

## 2013-08-11 ENCOUNTER — Ambulatory Visit (HOSPITAL_COMMUNITY): Payer: Self-pay | Admitting: Dentistry

## 2013-08-11 VITALS — BP 119/80 | HR 60 | Temp 97.4°F

## 2013-08-11 DIAGNOSIS — I359 Nonrheumatic aortic valve disorder, unspecified: Secondary | ICD-10-CM

## 2013-08-11 DIAGNOSIS — K08409 Partial loss of teeth, unspecified cause, unspecified class: Secondary | ICD-10-CM

## 2013-08-11 DIAGNOSIS — K030 Excessive attrition of teeth: Secondary | ICD-10-CM

## 2013-08-11 DIAGNOSIS — K036 Deposits [accretions] on teeth: Secondary | ICD-10-CM

## 2013-08-11 DIAGNOSIS — Z7901 Long term (current) use of anticoagulants: Secondary | ICD-10-CM

## 2013-08-11 DIAGNOSIS — K0530 Chronic periodontitis, unspecified: Secondary | ICD-10-CM

## 2013-08-11 DIAGNOSIS — Z0189 Encounter for other specified special examinations: Secondary | ICD-10-CM

## 2013-08-11 MED ORDER — CHLORHEXIDINE GLUCONATE 0.12 % MT SOLN: OROMUCOSAL | Status: DC

## 2013-08-11 MED ORDER — AMOXICILLIN 500 MG PO CAPS: ORAL_CAPSULE | ORAL | Status: DC

## 2013-08-11 NOTE — Progress Notes (Signed)
DENTAL CONSULTATION  Date of Consultation:  08/11/2013 Patient Name:   [name] Hinton Date of Birth:   Jul 25, 1970 Medical Record Number: [id number]  VITALS: BP 119/80  Pulse 60  Temp(Src) 97.4 F (36.3 C) (Oral)  CHIEF COMPLAINT: Patient was referred by Dr. Roxy Manns for a pre-heart valve surgery dental protocol examination.  HPI: [name] Hinton is a 43 year old male with recent diagnosis of severe aortic insufficiency.  Patient with precipitated aortic valve replacement, aortic root replacement, and possible Maze procedure with Dr. Roxy Manns. Patient is now seen as part of a medically necessary pre-heart valve surgery dental protocol examination.  The patient currently denies acute toothache, swellings, or abscesses. Patient was last seen by a dentist in [place], in the [place] 90's for an exam and cleaning. 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  Patient denies having any unmet dental needs. Patient does have a history of previous orthodontic therapy for 3 years from 1988-1991.   PROBLEM LIST: Patient Active Problem List   Diagnosis Date Noted  . Chronic systolic CHF (congestive heart failure) 08/10/2013  . Bicuspid aortic valve 08/10/2013  . Aortic insufficiency 08/10/2013  . Thoracic aortic aneurysm 08/10/2013  . Atrial fibrillation 08/10/2013  . CHF (congestive heart failure) 07/28/2013    PMH: Past Medical History  Diagnosis Date  . ETOH abuse   . Systolic CHF     a. Dx 07/2013: EF 25%, felt likely related to combo of EtOH + tachy-mediated + AI. LHC pending.  [name] PAF (paroxysmal atrial fibrillation)     a. Dx 07/2013, s/p TEE/DCCV.  . Paroxysmal atrial flutter     a. Dx 07/2013.  [name] Severe aortic insufficiency     a. Bicuspid aortic valve with severe AI, dilated ascending aorta dx 07/2013.    PSH: Past Surgical History  Procedure Laterality Date  . No past surgeries    . Tee without cardioversion N/A 08/01/2013    Procedure: TRANSESOPHAGEAL ECHOCARDIOGRAM (TEE);  Surgeon: [name], MD;  Location: [facility];  Service: Cardiovascular;  Laterality: N/A;  . Cardioversion N/A 08/01/2013    Procedure: CARDIOVERSION;  Surgeon: [name], MD;  Location: [facility];  Service: Cardiovascular;  Laterality: N/A;    ALLERGIES: No Known Allergies  MEDICATIONS: Current Outpatient Prescriptions  Medication Sig Dispense Refill  . furosemide (LASIX) 40 MG tablet Take 1 tablet (40 mg total) by mouth daily.  60 tablet  3  . losartan (COZAAR) 25 MG tablet Take 1 tablet (25 mg total) by mouth 2 (two) times daily.  30 tablet  3  . metoprolol succinate (TOPROL-XL) 25 MG 24 hr tablet Take 3 tablets (75 mg total) by mouth daily. Take with or immediately following a meal.  90 tablet  3  . Rivaroxaban (XARELTO) 20 MG TABS tablet Take 1 tablet (20 mg total) by mouth daily with supper.  30 tablet  2  . spironolactone (ALDACTONE) 25 MG tablet Take 0.5 tablets (12.5 mg total) by mouth daily.  30 tablet  3   No current facility-administered medications for this visit.    LABS: Lab Results  Component Value Date   WBC 7.5 08/01/2013   HGB 13.5 08/01/2013   HCT 40.8 08/01/2013   MCV 90.9 08/01/2013   PLT 215 08/01/2013      Component Value Date/Time   NA 139 08/10/2013 0959   K 5.2 08/10/2013 0959   CL 98 08/10/2013 0959   CO2 26 08/10/2013 0959   GLUCOSE 125* 08/10/2013 0959   BUN 29* 08/10/2013  0959   CREATININE 1.17 08/10/2013 0959   CALCIUM 9.0 08/10/2013 0959   GFRNONAA 77* 08/10/2013 0959   GFRAA 89* 08/10/2013 0959   Lab Results  Component Value Date   INR 1.30 07/28/2013   No results found for this basename: PTT    SOCIAL HISTORY: History   Social History  . Marital Status: Single    Spouse Name: N/A    Number of Children: N/A  . Years of Education: N/A   Occupational History  . Not on file.   Social History Main Topics  . Smoking status: Former Smoker -- 1.50 packs/day for 20 years    Quit date: 09/25/2009  . Smokeless tobacco: Never Used  . Alcohol Use:  Yes     Comment: 25 drinks/week - occasional in 2 months  . Drug Use: No  . Sexual Activity: Not on file   Other Topics Concern  . Not on file   Social History Narrative  . No narrative on file    FAMILY HISTORY: Family History  Problem Relation Age of Onset  . Heart failure Father      REVIEW OF SYSTEMS: Reviewed with the patient and positive as above.   DENTAL HISTORY: CHIEF COMPLAINT: Patient was referred by Dr. Roxy Manns for a pre-heart valve surgery dental protocol examination.  HPI: [name] Hinton is a 43 year old male with recent diagnosis of severe aortic insufficiency.  Patient with precipitated aortic valve replacement, aortic root replacement, and possible Maze procedure with Dr. Roxy Manns. Patient is now seen as part of a medically necessary pre-heart valve surgery dental protocol examination.  The patient currently denies acute toothache, swellings, or abscesses. Patient was last seen by a dentist in [place], in the [place] 90's for an exam and cleaning. 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  Patient denies having any unmet dental needs. Patient does have a history of previous orthodontic therapy for 3 years from 1988-1991.  DENTAL EXAMINATION:  GENERAL: The patient is a well-developed, well-nourished male in no acute distress. HEAD AND NECK: There is no palpable submandibular lymphadenopathy. The patient denies acute TMJ symptoms. INTRAORAL EXAM: Patient has normal saliva. I do not see any evidence of abscess formation. DENTITION: Patient is missing tooth numbers 1,5,12,16,17,21,28.  Spaces between teeth were closed with previous orthodontic therapy. PERIODONTAL: The patient has chronic periodontitis with plaque and calculus accumulations, selective areas gingival recession and no significant tooth mobility. DENTAL CARIES/SUBOPTIMAL RESTORATIONS: No obvious dental caries noted. There is evidence of mandibular anterior incisal attrition. ENDODONTIC: Patient denies having  any acute pulpitis symptoms. I do not see any evidence of periapical pathology or radiolucency. CROWN AND BRIDGE: There are no crown or bridge restorations. ORTHODONTIC: The patient had previous orthodontic therapy with removal of premolars and closure of the spaces. Treatment in 1988-1991. PROSTHODONTIC: There are no partial dentures. OCCLUSION: The occlusion is stable at this time.  RADIOGRAPHIC INTERPRETATION: An orthopantogram was taken and supplemented with a full series of dental radiographs. There are multiple missing teeth. Spaces of missing teeth are closed secondary to previous orthodontic therapy. There is radiographic calculus noted. There are no obvious dental caries noted. There are no obvious pericardial radiolucencies noted.  ASSESSMENTS: 1.  Severe aortic insufficiency with anticipated aortic valve replacement, aortic root replacement, and possible Maze procedure. 2. Chronic periodontitis with bone loss 3. Plaque and calculus accumulations 4. Incisal attrition 5. Multiple missing teeth primarily for orthodontic reasons. Spaces were closed with orthodontic therapy. 6. Stable occlusion at this time. 7. Xarelto anticoagulation with  the risk for bleeding with invasive dental procedures 8. Future need for antibiotic premedication prior to invasive dental procedures due to to anticipated aortic valve replacement.  PLAN/RECOMMENDATIONS: 1. I discussed the risks, benefits, and complications of various treatment options with the patient in relationship to his medical and dental conditions, anticipated heart valve surgery, current Xarelto therapyand risk for endocarditis. We discussed various treatment options to include no treatment, periodontal therapy, referral to an oral surgeon for evaluation for extraction of tooth #32, and referral to nes primary dentist for dental care. The patient currently wishes to proceed with initial periodontal therapy with dental medicine. This has been  scheduled for 09/06/2013 with the use of antibiotic premedication prior to invasive dental procedures. I will consider adjustment of Xarelto therapy as needed after discussion with cardiologist. I provided a prescription for chlorhexidine rinse to be used on a twice a day basis as instructed. I also provided a prescription for the amoxicillin for the antibiotic premedication prior to invasive dental procedures.   2. Discussion of findings with medical team and coordination of future medical and dental care as needed.  I spent 60 minutes face to face with patient and more than 50% of time was spent in counseling and /or coordination of care.   Lenn Cal, DDS

## 2013-08-11 NOTE — Addendum Note (Signed)
Encounter addended by: Evalee Mutton, CCT on: 08/11/2013  9:50 AM<BR>     Documentation filed: Charges VN

## 2013-08-11 NOTE — Patient Instructions (Signed)
The patient is scheduled to return to dental medicine for initial periodontal therapy with antibiotic premedication on 09/06/2012.  I will need to discuss possible discontinuation of Xarelto therapy with the cardiologist to allow for the initial periodontal therapy. I will discuss need for antibiotic premedication prior to invasive dental procedures with the cardiologist also. I will assist patient finding a new general dentist if needed. Dr. Enrique Sack

## 2013-08-29 ENCOUNTER — Encounter (HOSPITAL_COMMUNITY): Payer: Self-pay | Admitting: Dentistry

## 2013-09-01 ENCOUNTER — Ambulatory Visit (HOSPITAL_BASED_OUTPATIENT_CLINIC_OR_DEPARTMENT_OTHER)
Admission: RE | Admit: 2013-09-01 | Discharge: 2013-09-01 | Disposition: A | Discharge status: Home / Self Care | Payer: BC Managed Care – PPO | Source: Ambulatory Visit | Attending: Cardiology | Admitting: Cardiology

## 2013-09-01 ENCOUNTER — Encounter (HOSPITAL_COMMUNITY): Payer: Self-pay | Admitting: Cardiology

## 2013-09-01 ENCOUNTER — Ambulatory Visit (HOSPITAL_COMMUNITY)
Admit: 2013-09-01 | Discharge: 2013-09-01 | Disposition: A | Discharge status: Home / Self Care | Payer: BC Managed Care – PPO | Attending: Cardiology | Admitting: Cardiology

## 2013-09-01 ENCOUNTER — Encounter (HOSPITAL_COMMUNITY): Payer: Self-pay

## 2013-09-01 ENCOUNTER — Telehealth (HOSPITAL_COMMUNITY): Payer: Self-pay | Admitting: Cardiology

## 2013-09-01 VITALS — BP 90/56 | HR 70 | Wt 160.0 lb

## 2013-09-01 DIAGNOSIS — I712 Thoracic aortic aneurysm, without rupture, unspecified: Secondary | ICD-10-CM | POA: Insufficient documentation

## 2013-09-01 DIAGNOSIS — I5022 Chronic systolic (congestive) heart failure: Secondary | ICD-10-CM

## 2013-09-01 DIAGNOSIS — I079 Rheumatic tricuspid valve disease, unspecified: Secondary | ICD-10-CM | POA: Insufficient documentation

## 2013-09-01 DIAGNOSIS — I359 Nonrheumatic aortic valve disorder, unspecified: Secondary | ICD-10-CM | POA: Insufficient documentation

## 2013-09-01 DIAGNOSIS — I4891 Unspecified atrial fibrillation: Secondary | ICD-10-CM | POA: Insufficient documentation

## 2013-09-01 DIAGNOSIS — I059 Rheumatic mitral valve disease, unspecified: Secondary | ICD-10-CM | POA: Insufficient documentation

## 2013-09-01 DIAGNOSIS — Q2381 Bicuspid aortic valve: Secondary | ICD-10-CM

## 2013-09-01 DIAGNOSIS — Q231 Congenital insufficiency of aortic valve: Secondary | ICD-10-CM | POA: Insufficient documentation

## 2013-09-01 DIAGNOSIS — Z87891 Personal history of nicotine dependence: Secondary | ICD-10-CM | POA: Insufficient documentation

## 2013-09-01 DIAGNOSIS — I5021 Acute systolic (congestive) heart failure: Secondary | ICD-10-CM

## 2013-09-01 DIAGNOSIS — I351 Nonrheumatic aortic (valve) insufficiency: Secondary | ICD-10-CM

## 2013-09-01 DIAGNOSIS — I428 Other cardiomyopathies: Secondary | ICD-10-CM | POA: Insufficient documentation

## 2013-09-01 DIAGNOSIS — I509 Heart failure, unspecified: Secondary | ICD-10-CM

## 2013-09-01 DIAGNOSIS — I429 Cardiomyopathy, unspecified: Secondary | ICD-10-CM

## 2013-09-01 LAB — CBC WITH DIFFERENTIAL/PLATELET
Basophils Absolute: 0 10*3/uL (ref 0.0–0.1)
Basophils Relative: 0 % (ref 0–1)
Eosinophils Absolute: 0.2 10*3/uL (ref 0.0–0.7)
Eosinophils Relative: 3 % (ref 0–5)
HCT: 40.7 % (ref 39.0–52.0)
Hemoglobin: 13.8 g/dL (ref 13.0–17.0)
Lymphocytes Relative: 22 % (ref 12–46)
Lymphs Abs: 1.4 10*3/uL (ref 0.7–4.0)
MCH: 30.1 pg (ref 26.0–34.0)
MCHC: 33.9 g/dL (ref 30.0–36.0)
MCV: 88.7 fL (ref 78.0–100.0)
Monocytes Absolute: 0.6 10*3/uL (ref 0.1–1.0)
Monocytes Relative: 10 % (ref 3–12)
Neutro Abs: 4.1 10*3/uL (ref 1.7–7.7)
Neutrophils Relative %: 65 % (ref 43–77)
Platelets: 165 10*3/uL (ref 150–400)
RBC: 4.59 MIL/uL (ref 4.22–5.81)
RDW: 13.8 % (ref 11.5–15.5)
WBC: 6.4 10*3/uL (ref 4.0–10.5)

## 2013-09-01 LAB — BASIC METABOLIC PANEL
BUN: 18 mg/dL (ref 6–23)
CO2: 28 mEq/L (ref 19–32)
Calcium: 9.6 mg/dL (ref 8.4–10.5)
Chloride: 102 mEq/L (ref 96–112)
Creatinine, Ser: 1.1 mg/dL (ref 0.50–1.35)
GFR calc Af Amer: >90 mL/min (ref >90)
GFR calc non Af Amer: 82 mL/min — ABNORMAL LOW (ref >90)
Glucose, Bld: 108 mg/dL — ABNORMAL HIGH (ref 70–99)
Potassium: 4.7 mEq/L (ref 3.7–5.3)
Sodium: 142 mEq/L (ref 137–147)

## 2013-09-01 LAB — PROTIME-INR
INR: 1.21 (ref 0.00–1.49)
Prothrombin Time: 15 seconds (ref 11.6–15.2)

## 2013-09-01 NOTE — Progress Notes (Signed)
Patient ID: [name] Hinton, male   DOB: Nov 19, 1970, 43 y.o.   MRN: [id number]  Weight Range  150-153 pounds   Baseline proBNP     HPI:  [name] Hinton is a 43 year old with a history of alcohol abuse and former smoker -quit  2011. Hospitalized earlier this month with increased dyspnea-->ECHO performed and showed EF 25% Global hypokinesis RV mildly dilated aortic insufficiency, new onset Acute Heart Failure and A-Flutter. Started on cardizem drip and Xarelto. Initially in A flutter-->A fib.   He failed to convert and later had TEE and DC-CV on 08/01/13.  Dr Roxy Manns provided consultations due to aneurysm and bicuspid valve. He was discharged on ace, bb, spiro, and xarelto. Discharge weight 150 pounds.    Had cough with lisinopril and switched to losartan.  Has seen Dr. Enrique Sack and is scheduled for peridontal therapy on 09/06/13.   He returns for post hospital follow up. Feels very good. Walking every day 1.5-2 miles per day and going to gym without a problem. Denies SOB/PND/Orthopnea/dizzness/edema. Compliant with medications. No bleeding with Xarelto. Weight at home up to 155 Drinking  2 liters per day and low salt .   EF 25-30%. Dilated. Severe eccentric AI   Labs (1/15): K 5.2, creatinine 1.3, AST 40, ALT 58  ECG: NSR, LVH with repolarization abnormality  PMH:  1. A fib/Aflutter: Initially flutter during 1/15 admission, degenerated to fibrillation --> S/P TEE DC-CV to NSR.  2. 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  TEE (1/15) with EF 25%, moderate LV dilation, bicuspid aortic valve with severe eccentric AI and no AS, ascending aorta 5.0 cm, moderately decreased RV systolic function.  CTA chest (1/15) with 5.1 cm ascending aortic aneurysm.  3. Cardiomyopathy with systolic CHF: As above, TEE with EF 25% and moderately dilated LV, moderately decreased RV systolic function.  Tachy-mediated CMP versus ETOH CMP versus long-standing AI  versus a combination of the three.  4. Former smoker quit 2011 5. Alcohol abuse.  6. ACEI cough  FH: Father- Heart Failure, Grandfather CAD CABG   SH: Quit smoking 2011. Heavy ETOH until 1/15.  Single, works as Chief Operating Officer.  ROS: All systems negative except as listed in HPI, PMH and Problem List.  Current Outpatient Prescriptions  Medication Sig Dispense Refill  . amoxicillin (AMOXIL) 500 MG capsule Take four capsules one hour before dental appointment.  4 capsule  1  . chlorhexidine (PERIDEX) 0.12 % solution Rinse with 15 mls twice daily for 30 seconds. Use after breakfast and at bedtime. Spit out excess. Do not swallow.  480 mL  prn  . furosemide (LASIX) 40 MG tablet Take 1 tablet (40 mg total) by mouth daily.  60 tablet  3  . losartan (COZAAR) 25 MG tablet Take 1 tablet (25 mg total) by mouth 2 (two) times daily.  30 tablet  3  . metoprolol succinate (TOPROL-XL) 25 MG 24 hr tablet Take 3 tablets (75 mg total) by mouth daily. Take with or immediately following a meal.  90 tablet  3  . Rivaroxaban (XARELTO) 20 MG TABS tablet Take 1 tablet (20 mg total) by mouth daily with supper.  30 tablet  2  . spironolactone (ALDACTONE) 25 MG tablet Take 0.5 tablets (12.5 mg total) by mouth daily.  30 tablet  3   No current facility-administered medications for this encounter.     PHYSICAL EXAM: Filed Vitals:   09/01/13 1133  BP: 90/56  Pulse: 70  Weight:  160 lb (72.576 kg)  SpO2: 99%    General:  Well appearing. No resp difficulty. Mom present  HEENT: normal Neck: supple. JVP flat. Carotids 2+ bilaterally; no bruits. No lymphadenopathy or thryomegaly appreciated. Cor: PMI normal. Regular rate & rhythm. No rubs, gallops.  3/6 diastolic murmur along the left sternal border. Lungs: clear Abdomen: soft, nontender, nondistended. No hepatosplenomegaly. No bruits or masses. Good bowel sounds. Extremities: no cyanosis, clubbing, rash, edema Neuro: alert & orientedx3, cranial nerves grossly intact.  Moves all 4 extremities w/o difficulty. Affect pleasant.  ASSESSMENT & PLAN: 1. Chronic systolic CHF: Suspect nonischemic cardiomyopathy.  Tachy-mediated CMP versus ETOH CMP versus long-standing aortic insufficiency or (most likely) a combination of all 3 possible causes.  He is no longer drinking and is back in NSR.  NYHA I-II.   Looks euvolemic on exam.  - Echo reviewed. EF remains in 25-30% range despite being back in NSR for a month now. Suspect AI playing a major role here.  - Volume status looks very good and he is feeling much better. - BP too low to titrate medicines any further. - Will need LHC/RHC prior to AVR with [facility]  - Is scheduled to see Dr. Roxy Manns on 09/12/13. I will discuss timing of cath with Dr. Aundra Dubin. Will need to hold Xarelto for 24 hours.  2. Paroxysmal atrial fibrillation/flutter: s/p TEE/DCCV 08/01/13 . On Xarelto.  He remains in NSR.  3. Bicuspid aortic valve disorder with severe AI and severely dilated ascending aorta (5.1 cm on CTA).  He will need AVR + ascending aorta replacement.  Would like him to have Maze at the time of surgery as well for atrial fibrillation prevention.  4. ETOH abuse: He is no longer drinking since discharge from hospital.  As above will need AVR + ascending aorta replacement ([facility]) and hopefully Maze. He is now 1 month out from DC-CV so will schedule cath with Dr. Aundra Dubin followed by surgery.  Will continue anticoagulation post-DCCV x 1 month prior to any interruption.  After that time, plan LHC/RHC followed by repeat echo and surgery. Hold Xarelto Sunday and Monday. Restart Tuesday after dental work done.   Genevive Bi 09/01/2013

## 2013-09-01 NOTE — Addendum Note (Signed)
Encounter addended by: Kerry Dory, CMA on: 09/01/2013 12:27 PM<BR>     Documentation filed: Patient Instructions Section, Orders

## 2013-09-01 NOTE — Telephone Encounter (Signed)
PT SCHEDULED FOR [facility] HEART CATH CPT: 35597 ICD 9: 428.0 09/05/13 WITH PTS CURRENT INSURANCE-[employer]BS NO PRE CERT REQUIRED

## 2013-09-01 NOTE — Patient Instructions (Signed)
Your physician has requested that you have a cardiac catheterization. Cardiac catheterization is used to diagnose and/or treat various heart conditions. Doctors may recommend this procedure for a number of different reasons. The most common reason is to evaluate chest pain. Chest pain can be a symptom of coronary artery disease (CAD), and cardiac catheterization can show whether plaque is narrowing or blocking your heart's arteries. This procedure is also used to evaluate the valves, as well as measure the blood flow and oxygen levels in different parts of your heart. For further information please visit [email or web address]. Please follow instruction sheet, as given.  Your physician recommends that you schedule a follow-up appointment in: 1 month  DO NOT TAKE YOUR XARELTO ON Sunday AND Monday (09/04/13 AND 09/05/13)

## 2013-09-01 NOTE — Progress Notes (Signed)
  Echocardiogram 2D Echocardiogram has been performed.  [place], [facility] 09/01/2013, 12:08 PM

## 2013-09-02 ENCOUNTER — Other Ambulatory Visit (HOSPITAL_COMMUNITY): Payer: Self-pay | Admitting: Adult Health

## 2013-09-02 DIAGNOSIS — I5022 Chronic systolic (congestive) heart failure: Secondary | ICD-10-CM

## 2013-09-02 DIAGNOSIS — I5023 Acute on chronic systolic (congestive) heart failure: Secondary | ICD-10-CM

## 2013-09-05 ENCOUNTER — Ambulatory Visit (HOSPITAL_COMMUNITY)
Admission: RE | Admit: 2013-09-05 | Discharge: 2013-09-05 | Disposition: A | Discharge status: Home / Self Care | Payer: BC Managed Care – PPO | Source: Ambulatory Visit | Attending: Cardiology | Admitting: Cardiology

## 2013-09-05 ENCOUNTER — Encounter (HOSPITAL_COMMUNITY)
Admission: RE | Disposition: A | Discharge status: Home / Self Care | Payer: Self-pay | Source: Ambulatory Visit | Attending: Cardiology

## 2013-09-05 DIAGNOSIS — I359 Nonrheumatic aortic valve disorder, unspecified: Secondary | ICD-10-CM | POA: Insufficient documentation

## 2013-09-05 DIAGNOSIS — Q231 Congenital insufficiency of aortic valve: Secondary | ICD-10-CM | POA: Insufficient documentation

## 2013-09-05 DIAGNOSIS — I712 Thoracic aortic aneurysm, without rupture, unspecified: Secondary | ICD-10-CM | POA: Insufficient documentation

## 2013-09-05 DIAGNOSIS — I428 Other cardiomyopathies: Secondary | ICD-10-CM | POA: Insufficient documentation

## 2013-09-05 DIAGNOSIS — I509 Heart failure, unspecified: Secondary | ICD-10-CM | POA: Insufficient documentation

## 2013-09-05 DIAGNOSIS — Z7901 Long term (current) use of anticoagulants: Secondary | ICD-10-CM | POA: Insufficient documentation

## 2013-09-05 DIAGNOSIS — I5022 Chronic systolic (congestive) heart failure: Secondary | ICD-10-CM | POA: Insufficient documentation

## 2013-09-05 DIAGNOSIS — Z87891 Personal history of nicotine dependence: Secondary | ICD-10-CM | POA: Insufficient documentation

## 2013-09-05 DIAGNOSIS — I2789 Other specified pulmonary heart diseases: Secondary | ICD-10-CM | POA: Insufficient documentation

## 2013-09-05 DIAGNOSIS — F1010 Alcohol abuse, uncomplicated: Secondary | ICD-10-CM | POA: Insufficient documentation

## 2013-09-05 HISTORY — PX: LEFT AND RIGHT HEART CATHETERIZATION WITH CORONARY ANGIOGRAM: SHX5449

## 2013-09-05 LAB — POCT I-STAT 3, VENOUS BLOOD GAS (G3P V)
Acid-Base Excess: 1 mmol/L (ref 0.0–2.0)
Bicarbonate: 26.9 mEq/L — ABNORMAL HIGH (ref 20.0–24.0)
O2 Saturation: 59 %
TCO2: 28 mmol/L (ref 0–100)
pCO2, Ven: 48.9 mmHg (ref 45.0–50.0)
pH, Ven: 7.348 — ABNORMAL HIGH (ref 7.250–7.300)
pO2, Ven: 33 mmHg (ref 30.0–45.0)

## 2013-09-05 LAB — POCT I-STAT 3, ART BLOOD GAS (G3+)
Acid-base deficit: 1 mmol/L (ref 0.0–2.0)
Bicarbonate: 24.2 mEq/L — ABNORMAL HIGH (ref 20.0–24.0)
Bicarbonate: 24.9 mEq/L — ABNORMAL HIGH (ref 20.0–24.0)
O2 Saturation: 99 %
O2 Saturation: 99 %
TCO2: 25 mmol/L (ref 0–100)
TCO2: 26 mmol/L (ref 0–100)
pCO2 arterial: 40.3 mmHg (ref 35.0–45.0)
pCO2 arterial: 42 mmHg (ref 35.0–45.0)
pH, Arterial: 7.381 (ref 7.350–7.450)
pH, Arterial: 7.387 (ref 7.350–7.450)
pO2, Arterial: 118 mmHg — ABNORMAL HIGH (ref 80.0–100.0)
pO2, Arterial: 121 mmHg — ABNORMAL HIGH (ref 80.0–100.0)

## 2013-09-05 SURGERY — LEFT AND RIGHT HEART CATHETERIZATION WITH CORONARY ANGIOGRAM
Anesthesia: LOCAL

## 2013-09-05 MED ORDER — VERAPAMIL HCL 2.5 MG/ML IV SOLN
INTRAVENOUS | Status: AC
  Filled 2013-09-05: qty 2

## 2013-09-05 MED ORDER — LIDOCAINE HCL (PF) 1 % IJ SOLN
INTRAMUSCULAR | Status: AC
  Filled 2013-09-05: qty 30

## 2013-09-05 MED ORDER — SODIUM CHLORIDE 0.9 % IJ SOLN: 3.0000 mL | Freq: Two times a day (BID) | INTRAMUSCULAR | Status: DC

## 2013-09-05 MED ORDER — HEPARIN SODIUM (PORCINE) 1000 UNIT/ML IJ SOLN
INTRAMUSCULAR | Status: AC
  Filled 2013-09-05: qty 1

## 2013-09-05 MED ORDER — SODIUM CHLORIDE 0.9 % IJ SOLN: 3.0000 mL | INTRAMUSCULAR | Status: DC | PRN

## 2013-09-05 MED ORDER — SODIUM CHLORIDE 0.9 % IV SOLN: 250.0000 mL | INTRAVENOUS | Status: DC | PRN

## 2013-09-05 MED ORDER — FENTANYL CITRATE 0.05 MG/ML IJ SOLN
INTRAMUSCULAR | Status: AC
  Filled 2013-09-05: qty 2

## 2013-09-05 MED ORDER — HEPARIN (PORCINE) IN NACL 2-0.9 UNIT/ML-% IJ SOLN
INTRAMUSCULAR | Status: AC
  Filled 2013-09-05: qty 1000

## 2013-09-05 MED ORDER — ASPIRIN 81 MG PO CHEW
81.0000 mg | CHEWABLE_TABLET | ORAL | Status: AC
  Administered 2013-09-05: 81 mg via ORAL
  Filled 2013-09-05: qty 1

## 2013-09-05 MED ORDER — ONDANSETRON HCL 4 MG/2ML IJ SOLN: 4.0000 mg | Freq: Four times a day (QID) | INTRAMUSCULAR | Status: DC | PRN

## 2013-09-05 MED ORDER — ACETAMINOPHEN 325 MG PO TABS: 650.0000 mg | ORAL_TABLET | ORAL | Status: DC | PRN

## 2013-09-05 MED ORDER — MIDAZOLAM HCL 2 MG/2ML IJ SOLN
INTRAMUSCULAR | Status: AC
  Filled 2013-09-05: qty 2

## 2013-09-05 MED ORDER — SODIUM CHLORIDE 0.9 % IV SOLN
INTRAVENOUS | Status: DC
  Administered 2013-09-05: 08:00:00 via INTRAVENOUS

## 2013-09-05 NOTE — Progress Notes (Signed)
Received pt from cath procedure alert and denies any discomfort. 

## 2013-09-05 NOTE — Discharge Instructions (Signed)
Radial Site Care Refer to this sheet in the next few weeks. These instructions provide you with information on caring for yourself after your procedure. Your caregiver may also give you more specific instructions. Your treatment has been planned according to current medical practices, but problems sometimes occur. Call your caregiver if you have any problems or questions after your procedure. HOME CARE INSTRUCTIONS  You may shower the day after the procedure.Remove the bandage (dressing) and gently wash the site with plain soap and water.Gently pat the site dry.  Do not apply powder or lotion to the site.  Do not submerge the affected site in water for 3 to 5 days.  Inspect the site at least twice daily.  Do not flex or bend the affected arm for 24 hours.  No lifting over 5 pounds (2.3 kg) for 5 days after your procedure.  Do not drive home if you are discharged the same day of the procedure. Have someone else drive you.  You may drive 24 hours after the procedure unless otherwise instructed by your caregiver.  Do not operate machinery or power tools for 24 hours.  A responsible adult should be with you for the first 24 hours after you arrive home. What to expect:  Any bruising will usually fade within 1 to 2 weeks.  Blood that collects in the tissue (hematoma) may be painful to the touch. It should usually decrease in size and tenderness within 1 to 2 weeks. SEEK IMMEDIATE MEDICAL CARE IF:  You have unusual pain at the radial site.  You have redness, warmth, swelling, or pain at the radial site.  You have drainage (other than a small amount of blood on the dressing).  You have chills.  You have a fever or persistent symptoms for more than 72 hours.  You have a fever and your symptoms suddenly get worse.  Your arm becomes pale, cool, tingly, or numb.  You have heavy bleeding from the site. Hold pressure on the site.  If bleeding is not under control call 911 Document  Released: 08/09/2010 Document Revised: 09/29/2011 Document Reviewed: 08/09/2010 [facility] Patient Information 2014 [place], [place].

## 2013-09-05 NOTE — Interval H&P Note (Signed)
History and Physical Interval Note:  09/05/2013 10:29 AM  [name] Hinton  has presented today for surgery, with the diagnosis of Heart failure  The various methods of treatment have been discussed with the patient and family. After consideration of risks, benefits and other options for treatment, the patient has consented to  Procedure(s): LEFT AND RIGHT HEART CATHETERIZATION WITH CORONARY ANGIOGRAM (N/A) as a surgical intervention .  The patient's history has been reviewed, patient examined, no change in status, stable for surgery.  I have reviewed the patient's chart and labs.  Questions were answered to the patient's satisfaction.     [name] [employer]

## 2013-09-05 NOTE — Progress Notes (Signed)
Discharge instruction given per MD order.  Pt and CG able to verbalize understanding.  Pt to car via wheelchair. 

## 2013-09-05 NOTE — H&P (View-Only) (Signed)
Patient ID: [name] Hinton, male   DOB: 11/09/1970, 42 y.o.   MRN: [id number]  Weight Range  150-153 pounds   Baseline proBNP     HPI:  [name] is a 42 year old with a history of alcohol abuse and former smoker -quit  2011. Hospitalized earlier this month with increased dyspnea-->ECHO performed and showed EF 25% Global hypokinesis RV mildly dilated aortic insufficiency, new onset Acute Heart Failure and A-Flutter. Started on cardizem drip and Xarelto. Initially in A flutter-->A fib.   He failed to convert and later had TEE and DC-CV on 08/01/13.  Dr Owen provided consultations due to aneurysm and bicuspid valve. He was discharged on ace, bb, spiro, and xarelto. Discharge weight 150 pounds.    Had cough with lisinopril and switched to losartan.  Has seen Dr. Kulinski and is scheduled for peridontal therapy on 09/06/13.   He returns for post hospital follow up. Feels very good. Walking every day 1.5-2 miles per day and going to gym without a problem. Denies SOB/PND/Orthopnea/dizzness/edema. Compliant with medications. No bleeding with Xarelto. Weight at home up to 155 Drinking  2 liters per day and low salt .   EF 25-30%. Dilated. Severe eccentric AI   Labs (1/15): K 5.2, creatinine 1.3, AST 40, ALT 58  ECG: NSR, LVH with repolarization abnormality  PMH:  1. A fib/Aflutter: Initially flutter during 1/15 admission, degenerated to fibrillation --> S/P TEE DC-CV to NSR.  2. 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  TEE (1/15) with EF 25%, moderate LV dilation, bicuspid aortic valve with severe eccentric AI and no AS, ascending aorta 5.0 cm, moderately decreased RV systolic function.  CTA chest (1/15) with 5.1 cm ascending aortic aneurysm.  3. Cardiomyopathy with systolic CHF: As above, TEE with EF 25% and moderately dilated LV, moderately decreased RV systolic function.  Tachy-mediated CMP versus ETOH CMP versus long-standing AI  versus a combination of the three.  4. Former smoker quit 2011 5. Alcohol abuse.  6. ACEI cough  FH: Father- Heart Failure, Grandfather CAD CABG   SH: Quit smoking 2011. Heavy ETOH until 1/15.  Single, works as bartender.  ROS: All systems negative except as listed in HPI, PMH and Problem List.  Current Outpatient Prescriptions  Medication Sig Dispense Refill  . amoxicillin (AMOXIL) 500 MG capsule Take four capsules one hour before dental appointment.  4 capsule  1  . chlorhexidine (PERIDEX) 0.12 % solution Rinse with 15 mls twice daily for 30 seconds. Use after breakfast and at bedtime. Spit out excess. Do not swallow.  480 mL  prn  . furosemide (LASIX) 40 MG tablet Take 1 tablet (40 mg total) by mouth daily.  60 tablet  3  . losartan (COZAAR) 25 MG tablet Take 1 tablet (25 mg total) by mouth 2 (two) times daily.  30 tablet  3  . metoprolol succinate (TOPROL-XL) 25 MG 24 hr tablet Take 3 tablets (75 mg total) by mouth daily. Take with or immediately following a meal.  90 tablet  3  . Rivaroxaban (XARELTO) 20 MG TABS tablet Take 1 tablet (20 mg total) by mouth daily with supper.  30 tablet  2  . spironolactone (ALDACTONE) 25 MG tablet Take 0.5 tablets (12.5 mg total) by mouth daily.  30 tablet  3   No current facility-administered medications for this encounter.     PHYSICAL EXAM: Filed Vitals:   09/01/13 1133  BP: 90/56  Pulse: 70  Weight:   160 lb (72.576 kg)  SpO2: 99%    General:  Well appearing. No resp difficulty. Mom present  HEENT: normal Neck: supple. JVP flat. Carotids 2+ bilaterally; no bruits. No lymphadenopathy or thryomegaly appreciated. Cor: PMI normal. Regular rate & rhythm. No rubs, gallops.  3/6 diastolic murmur along the left sternal border. Lungs: clear Abdomen: soft, nontender, nondistended. No hepatosplenomegaly. No bruits or masses. Good bowel sounds. Extremities: no cyanosis, clubbing, rash, edema Neuro: alert & orientedx3, cranial nerves grossly intact.  Moves all 4 extremities w/o difficulty. Affect pleasant.  ASSESSMENT & PLAN: 1. Chronic systolic CHF: Suspect nonischemic cardiomyopathy.  Tachy-mediated CMP versus ETOH CMP versus long-standing aortic insufficiency or (most likely) a combination of all 3 possible causes.  He is no longer drinking and is back in NSR.  NYHA I-II.   Looks euvolemic on exam.  - Echo reviewed. EF remains in 25-30% range despite being back in NSR for a month now. Suspect AI playing a major role here.  - Volume status looks very good and he is feeling much better. - BP too low to titrate medicines any further. - Will need LHC/RHC prior to AVR with [facility]  - Is scheduled to see Dr. Roxy Manns on 09/12/13. I will discuss timing of cath with Dr. Aundra Dubin. Will need to hold Xarelto for 24 hours.  2. Paroxysmal atrial fibrillation/flutter: s/p TEE/DCCV 08/01/13 . On Xarelto.  He remains in NSR.  3. Bicuspid aortic valve disorder with severe AI and severely dilated ascending aorta (5.1 cm on CTA).  He will need AVR + ascending aorta replacement.  Would like him to have Maze at the time of surgery as well for atrial fibrillation prevention.  4. ETOH abuse: He is no longer drinking since discharge from hospital.  As above will need AVR + ascending aorta replacement ([facility]) and hopefully Maze. He is now 1 month out from DC-CV so will schedule cath with Dr. Aundra Dubin followed by surgery.  Will continue anticoagulation post-DCCV x 1 month prior to any interruption.  After that time, plan LHC/RHC followed by repeat echo and surgery. Hold Xarelto Sunday and Monday. Restart Tuesday after dental work done.   Genevive Bi 09/01/2013

## 2013-09-05 NOTE — CV Procedure (Signed)
   Cardiac Catheterization Procedure Note  Name: [name] Hinton MRN: [id number] DOB: Aug 25, 1970  Procedure: Right Heart Cath, Left Heart Cath, Selective Coronary Angiography  Indication: Severe AI, pre-surgery   Procedural Details: Allen's test was positive on the right arm. The radial and brachial areas were prepped, draped, and anesthetized with 1% lidocaine. There was an IV in the right brachial vein that was exchanged out for a 5 French venous sheath.  Using the modified Seldinger technique a 5 French sheath was placed in the right radial artery.  The patient received intraarterial verapamil 3 mg and weight-based IV heparin bolus. A Swan-Ganz catheter was used for the right heart catheterization. Standard protocol was followed for recording of right heart pressures and sampling of oxygen saturations. Fick cardiac output was calculated. Standard Judkins catheters were used for selective coronary angiography.  Left ventriculography and aortic root shot were not done due to elevated LVEDP. There were no immediate procedural complications. The patient was transferred to the post catheterization recovery area for further monitoring.  Procedural Findings: Hemodynamics (mmHg) RA mean 5 RV 69/10 PA 62/25, mean 39 PCWP mean 22 LV 115/35 AO 111/50  Oxygen saturations: PA 59% AO 99%  Cardiac Output (Fick) 3.28  Cardiac Index (Fick) 1.8 PVR 5.2 WU   Coronary angiography: Coronary dominance: right  Left mainstem: No significant disease.   Left anterior descending (LAD): Luminal irregularities in the distal LAD.    Left circumflex (LCx): There was a large ramus.  No significant disease in the LCx system.   Right coronary artery (RCA): Difficult to engage due to location and to catheter whip with severe aortic insufficiency.  There was no significant CAD.   Left ventriculography: Not done.  LVEDP was very high with severe AI.  For the same reason, aortic root shot was not done.   Final  Conclusions:  No significant coronary disease.  PCWP and LVEDP elevated in the setting of severe AI with moderate pulmonary hypertension (suspect mostly pulmonary venous hypertension but with PVR 5.1 WU there may be a component of vascular remodeling).  EF 35-40% on last echo, somewhat improved. CI was measured as low, not sure accuracy given how well patient is doing symptomatically.  Today, I will increase Lasix back to 40 mg bid.  I will see him back in the CHF clinic in 10-14 days.  He will followup with Dr. Roxy Manns for AVR/ascending aorta repair.   [name] 09/05/2013, 11:38 AM

## 2013-09-06 ENCOUNTER — Encounter (HOSPITAL_COMMUNITY): Payer: Self-pay | Admitting: Dentistry

## 2013-09-08 ENCOUNTER — Encounter (HOSPITAL_COMMUNITY): Payer: Self-pay | Admitting: Dentistry

## 2013-09-12 ENCOUNTER — Encounter (HOSPITAL_COMMUNITY): Payer: Self-pay | Admitting: Dentistry

## 2013-09-12 ENCOUNTER — Ambulatory Visit (INDEPENDENT_AMBULATORY_CARE_PROVIDER_SITE_OTHER): Payer: BC Managed Care – PPO | Admitting: Thoracic Surgery (Cardiothoracic Vascular Surgery)

## 2013-09-12 ENCOUNTER — Other Ambulatory Visit: Payer: Self-pay | Admitting: *Deleted

## 2013-09-12 ENCOUNTER — Ambulatory Visit (HOSPITAL_COMMUNITY): Payer: Self-pay | Admitting: Dentistry

## 2013-09-12 ENCOUNTER — Encounter: Payer: Self-pay | Admitting: Thoracic Surgery (Cardiothoracic Vascular Surgery)

## 2013-09-12 VITALS — BP 113/68 | HR 58 | Temp 97.5°F

## 2013-09-12 VITALS — BP 121/64 | HR 64 | Resp 16 | Ht 68.0 in | Wt 155.0 lb

## 2013-09-12 DIAGNOSIS — I351 Nonrheumatic aortic (valve) insufficiency: Secondary | ICD-10-CM

## 2013-09-12 DIAGNOSIS — I359 Nonrheumatic aortic valve disorder, unspecified: Secondary | ICD-10-CM

## 2013-09-12 DIAGNOSIS — K0530 Chronic periodontitis, unspecified: Secondary | ICD-10-CM

## 2013-09-12 DIAGNOSIS — Z0189 Encounter for other specified special examinations: Secondary | ICD-10-CM

## 2013-09-12 DIAGNOSIS — I4891 Unspecified atrial fibrillation: Secondary | ICD-10-CM

## 2013-09-12 DIAGNOSIS — I712 Thoracic aortic aneurysm, without rupture, unspecified: Secondary | ICD-10-CM

## 2013-09-12 DIAGNOSIS — Q231 Congenital insufficiency of aortic valve: Secondary | ICD-10-CM

## 2013-09-12 DIAGNOSIS — K036 Deposits [accretions] on teeth: Secondary | ICD-10-CM

## 2013-09-12 MED ORDER — AMIODARONE HCL 200 MG PO TABS: 200.0000 mg | ORAL_TABLET | Freq: Two times a day (BID) | ORAL | Status: DC

## 2013-09-12 NOTE — Addendum Note (Signed)
Addended by: Darylene Price MD H on: 09/12/2013 01:43 PM   Modules accepted: Orders

## 2013-09-12 NOTE — Progress Notes (Signed)
09/12/2013  Patient:            [name] Hinton Date of Birth:  09-12-1970 MRN:                [id number]  BP 113/68  Pulse 58  Temp(Src) 97.5 F (36.4 C) (Oral)  DAK SZUMSKI presents for periodontal therapy today. Patient took amoxicillin 2.0 grams one hour prior to dental appointment.  Patient discontinued Xarelto therapy as per instructions by cardiologist 2 days prior to dental treatment. Patient was reminded to restart Xarelto therapy later today.  Patient agrees to proceed with initial periodontal therapy today. Patient accepts the risks, benefits, and complications of the proposed dental treatment at this time.  Procedure: 30 second chlorhexidine gluconate 0.12% rinse. D4355 Gross Debridement procedure with Cavitron ultrasonic scaler and hand curettes as needed.  Significant calculus and stain removed.             Prophy paste polish. Floss. Oral hygiene instructions provided.  30 second chlorhexidine gluconate 0.12% rinse. Patient is to continue chlorhexidine rinses twice daily as prescribed for the next 3-4 months. Patient tolerated procedure well. Postop instructions provided. Patient is aware of the need for followup periodontal care in 3-6 months with antibiotic premedication. Patient will call if he wishes to have records forwarded to his new dentist. Patient is currently cleared for heart valve surgery. Patient dismissed in stable condition.  Lenn Cal, DDS

## 2013-09-12 NOTE — Progress Notes (Signed)
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 He was originally seen in consultation during his hospitalization on 08/01/2013.  He presented with acute exacerbation of chronic combined systolic and diastolic congestive heart failure in the setting of rapid persistent atrial flutter. He underwent cardioversion back to sinus rhythm and has been anticoagulated using Xarelto since that time. He is clinically done well since hospital discharge and has maintained sinus rhythm.  He subsequently underwent left and right heart catheterization by Dr. Aundra Hinton on 09/05/2013.  He was found to have no significant coronary artery disease but he did have elevated pulmonary catheter wedge pressure and left ventricular end-diastolic pressure with moderate pulmonary hypertension. He returns to the office today to discuss possible elective surgical intervention.  He is been cleared for surgery by Dr. Lawana Hinton  The patient reports that he has been feeling quite well over the past month. His breathing is much better than it was at the time of his hospitalization in early January. He denies any exertional shortness of breath, PND, orthopnea, or lower extremity edema. He has never had any chest discomfort. He has not had any tachypalpitations or dizzy spells.   Current Outpatient Prescriptions  Medication Sig Dispense Refill  . amoxicillin (AMOXIL) 500 MG capsule Take four capsules one hour before dental appointment.  4 capsule  1  . chlorhexidine (PERIDEX) 0.12 % solution Rinse with 15 mls twice daily for 30 seconds. Use after breakfast and at bedtime. Spit out excess. Do not swallow.  480 mL   prn  . furosemide (LASIX) 40 MG tablet Take 40 mg by mouth 2 (two) times daily.      [name] losartan (COZAAR) 25 MG tablet Take 1 tablet (25 mg total) by mouth 2 (two) times daily.  30 tablet  3  . metoprolol succinate (TOPROL-XL) 25 MG 24 hr tablet Take 3 tablets (75 mg total) by mouth daily. Take with or immediately following a meal.  90 tablet  3  . Rivaroxaban (XARELTO) 20 MG TABS tablet Take 1 tablet (20 mg total) by mouth daily with supper.  30 tablet  2  . spironolactone (ALDACTONE) 25 MG tablet Take 0.5 tablets (12.5 mg total) by mouth daily.  30 tablet  3   No current facility-administered medications for this visit.      Physical Exam:   BP 121/64  Pulse 64  Resp 16  Ht 5\' 8"  (1.727 m)  Wt 155 lb (70.308 kg)  BMI 23.57 kg/m2  SpO2 99%  General:  Well-appearing  Chest:   Clear to auscultation  CV:   Regular rate and rhythm with prominent diastolic murmur  Incisions:  n/a  Abdomen:  Soft and nontender  Extremities:  Warm and well-perfused with no lower extremity edema  Diagnostic Tests:  Cardiac Catheterization Procedure Note  Name: [name] Hinton  MRN: [id number]  DOB: May 16, 1971  Procedure: Right Heart Cath, Left Heart Cath, Selective Coronary Angiography  Indication: Severe AI, pre-surgery  Procedural Details: Allen's test was positive on the right arm. The radial and brachial areas were prepped, draped, and anesthetized with 1%  lidocaine. There was an IV in the right brachial vein that was exchanged out for a 5 French venous sheath. Using the modified Seldinger technique a 5 French sheath was placed in the right radial artery. The patient received intraarterial verapamil 3 mg and weight-based IV heparin bolus. A Swan-Ganz catheter was used for the right heart catheterization. Standard protocol was followed for recording of right heart pressures and sampling of oxygen saturations. Fick cardiac output was calculated. Standard Judkins catheters were used for selective coronary  angiography. Left ventriculography and aortic root shot were not done due to elevated LVEDP. There were no immediate procedural complications. The patient was transferred to the post catheterization recovery area for further monitoring.  Procedural Findings:  Hemodynamics (mmHg)  RA mean 5  RV 69/10  PA 62/25, mean 39  PCWP mean 22  LV 115/35  AO 111/50  Oxygen saturations:  PA 59%  AO 99%  Cardiac Output (Fick) 3.28  Cardiac Index (Fick) 1.8  PVR 5.2 WU  Coronary angiography:  Coronary dominance: right  Left mainstem: No significant disease.  Left anterior descending (LAD): Luminal irregularities in the distal LAD.  Left circumflex (LCx): There was a large ramus. No significant disease in the LCx system.  Right coronary artery (RCA): Difficult to engage due to location and to catheter whip with severe aortic insufficiency. There was no significant CAD.  Left ventriculography: Not done. LVEDP was very high with severe AI. For the same reason, aortic root shot was not done.  Final Conclusions: No significant coronary disease. PCWP and LVEDP elevated in the setting of severe AI with moderate pulmonary hypertension (suspect mostly pulmonary venous hypertension but with PVR 5.1 WU there may be a component of vascular remodeling). EF 35-40% on last echo, somewhat improved. CI was measured as low, not sure accuracy given how well patient is doing symptomatically. Today, I will increase Lasix back to 40 mg bid. I will see him back in the CHF clinic in 10-14 days. He will followup with Dr. Roxy Hinton for AVR/ascending aorta repair.  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 The patient has severe global left ventricular chamber enlargement and systolic dysfunction  with ejection fraction estimated 25% by echocardiogram in January. At that time the patient was in atrial flutter, and he has now been successfully cardioverted back to sinus rhythm, and ejection fraction appeared slightly improved at the time of recent catheterization. He will require aortic root replacement, and based upon the severity of aortic insufficiency and asymmetric bicuspid valve morphology, I do not feel that there is a role for a valve sparing procedure. Given the patient's relatively young age a pulmonary autograft aortic root replacement (Ross procedure) could be considered, although this might be associated with suboptimal long-term outcome in the setting of severe aortic insufficiency with dilated aortic root.  Furthermore, given the severity of left ventricular dysfunction and presence of significant pulmonary hypertension I would be even more reluctant to consider the Ross procedure an option. The Finally, concomitant Maze procedure might be reasonable under the circumstances to decrease the patient's risk of future recurrence of atrial fibrillation or atrial flutter.   Plan:  The patient was counseled at length regarding surgical alternatives with respect to aortic valve replacement including continued medical therapy versus proceeding with  conventional surgical aortic valve replacement using either a mechanical prosthesis or a bioprosthetic tissue valve.  Other alternatives including the Ross autograft procedure, homograft aortic root replacement, stentless bioprosthetic tissue valve replacement, valve repair, and transcatheter aortic valve replacement were discussed.  Discussion was held comparing the relative risks of mechanical valve replacement with need for lifelong anticoagulation versus use of a bioprosthetic tissue valve and the associated potential for late structural valve deterioration in failure.  This discussion was placed in the context of the patient's particular  circumstances, and as a result the patient specifically requests that their valve be replaced using a mechanical valve-conduit.  The rationale for concomitant Maze procedure has been discussed as well as all associated risks and benefits.  The patient understands and accepts all potential associated risks of surgery including but not limited to risk of death, stroke, myocardial infarction, congestive heart failure, respiratory failure, renal failure, pneumonia, bleeding requiring blood transfusion and or reexploration, arrhythmia, heart block or bradycardia requiring permanent pacemaker, aortic dissection or other major vascular complication, pleural effusions or other delayed complications related to continued congestive heart failure, and other late complications related to valve replacement including structural valve deterioration and failure, thrombosis, endocarditis, or paravalvular leak.  We plan to proceed with surgery on Tuesday, 09/27/2013. The patient will return for followup on 09/26/2013. He has been instructed to stop taking Xarelto 7 days prior to surgery.  We will begin him on amiodarone prophylactically at that time.   I spent in excess of 90 minutes during the conduct of this office consultation and >50% of this time involved direct face-to-face encounter with the patient for counseling and/or coordination of their care.    Valentina Gu. [name] Manns, MD 09/12/2013 1:25 PM

## 2013-09-12 NOTE — Patient Instructions (Addendum)
Stop taking Xarelto at least 7 days before your surgery (March 3rd)  Begin taking amiodarone 7 days before your surgery (March 3rd)

## 2013-09-12 NOTE — Patient Instructions (Addendum)
Patient to continue chlorhexidine rinses as prescribed. Patient to restart Xarelto therapy later this evening. Patient to call if problems arise. Patient to sign a release of information for referral of records to new dentist if so desired. Dr. Enrique Sack

## 2013-09-20 ENCOUNTER — Ambulatory Visit (HOSPITAL_COMMUNITY)
Admission: RE | Admit: 2013-09-20 | Discharge: 2013-09-20 | Disposition: A | Discharge status: Home / Self Care | Payer: BC Managed Care – PPO | Attending: Internal Medicine | Admitting: Internal Medicine

## 2013-09-20 VITALS — BP 116/56 | HR 57 | Wt 163.2 lb

## 2013-09-20 DIAGNOSIS — I5022 Chronic systolic (congestive) heart failure: Secondary | ICD-10-CM

## 2013-09-20 DIAGNOSIS — I351 Nonrheumatic aortic (valve) insufficiency: Secondary | ICD-10-CM

## 2013-09-20 DIAGNOSIS — I509 Heart failure, unspecified: Secondary | ICD-10-CM

## 2013-09-20 DIAGNOSIS — I712 Thoracic aortic aneurysm, without rupture, unspecified: Secondary | ICD-10-CM

## 2013-09-20 DIAGNOSIS — I359 Nonrheumatic aortic valve disorder, unspecified: Secondary | ICD-10-CM

## 2013-09-20 DIAGNOSIS — Q231 Congenital insufficiency of aortic valve: Secondary | ICD-10-CM

## 2013-09-20 DIAGNOSIS — I4891 Unspecified atrial fibrillation: Secondary | ICD-10-CM

## 2013-09-20 DIAGNOSIS — I428 Other cardiomyopathies: Secondary | ICD-10-CM | POA: Insufficient documentation

## 2013-09-20 LAB — BASIC METABOLIC PANEL
BUN: 22 mg/dL (ref 6–23)
CO2: 29 mEq/L (ref 19–32)
Calcium: 9.8 mg/dL (ref 8.4–10.5)
Chloride: 101 mEq/L (ref 96–112)
Creatinine, Ser: 1.04 mg/dL (ref 0.50–1.35)
GFR calc Af Amer: >90 mL/min (ref >90)
GFR calc non Af Amer: 88 mL/min — ABNORMAL LOW (ref >90)
Glucose, Bld: 99 mg/dL (ref 70–99)
Potassium: 4.2 mEq/L (ref 3.7–5.3)
Sodium: 141 mEq/L (ref 137–147)

## 2013-09-20 NOTE — Progress Notes (Signed)
Patient ID: [name] Hinton, male   DOB: [birth year], 43 y.o.   MRN: [id number]  Weight Range  150-153 pounds   Baseline proBNP     HPI:  [name] is a 43 year old with a history of alcohol abuse and former smoker -quit  2011. Hospitalized 1/15 with increased dyspnea-->ECHO performed and showed EF 25% Global hypokinesis, RV mildly dilated,severe aortic insufficiency.  Patient had new-onset acute systolic CHF and atrial flutter. Started on cardizem drip and Xarelto. Initially in A flutter--> became A fib.   He had TEE and DC-CV on 08/01/13.  Dr Roxy Manns provided consultation due to ascending aortic aneurysm and bicuspid valve with severe AI. He was discharged on ace, bb, spironolactone, and xarelto. Discharge weight 150 pounds.    Had cough with lisinopril and switched to losartan.  Has seen Dr. Enrique Sack and is scheduled for peridontal therapy on 09/06/13.   LHC/RHC was done 2/15 showing no coronary disease and elevated PCWP with pulmonary hypertension.   After cath, I increased his Lasix to 40 mg bid.  He seems to be doing well symptomatically currently.  No exertional dyspnea.  He works out for about 15 minutes on a [employer] daily.  No tachypalpitations.  No orthopnea or bendopnea.  He is in NSR today.   Labs (1/15): K 5.2, creatinine 1.3, AST 40, ALT 58 Labs (2/15): K 4.7, creatinine 1.1  PMH:  1. A fib/Aflutter: Initially flutter during 1/15 admission, degenerated to fibrillation --> S/P TEE DC-CV to NSR.  2. 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  TEE (1/15) with EF 25%, moderate LV dilation, bicuspid aortic valve with severe eccentric AI and no AS, ascending aorta 5.0 cm, moderately decreased RV systolic function.  CTA chest (1/15) with 5.1 cm ascending aortic aneurysm.  3. Cardiomyopathy with systolic CHF: As above, TEE with EF 25% and moderately dilated LV, moderately decreased RV systolic function.  Tachy-mediated CMP  versus ETOH CMP versus long-standing AI versus a combination of the three.  LHC/RHC (2/15): no coronary disease, mean RA 5, PA 62/25, mean PCWP 22, CI 1.8, PVR 5.2 WU, LVEDP 35 (severe AI).  Echo (2/15): EF 35-40%, moderately dilated LV, bicuspid aortic valve with severe AI.  4. Former smoker quit 2011 5. Alcohol abuse.  6. ACEI cough  FH: Father- Heart Failure, Grandfather CAD CABG   SH: Quit smoking 2011. Heavy ETOH until 1/15.  Single, works as Chief Operating Officer.  ROS: All systems negative except as listed in HPI, PMH and Problem List.  Current Outpatient Prescriptions  Medication Sig Dispense Refill  . amiodarone (PACERONE) 200 MG tablet Take 200 mg by mouth 2 (two) times daily. Begin 7 days prior to surgery.      [name] amoxicillin (AMOXIL) 500 MG capsule Take four capsules one hour before dental appointment.  4 capsule  1  . chlorhexidine (PERIDEX) 0.12 % solution Rinse with 15 mls twice daily for 30 seconds. Use after breakfast and at bedtime. Spit out excess. Do not swallow.  480 mL  prn  . furosemide (LASIX) 40 MG tablet Take 40 mg by mouth 2 (two) times daily.      [name] losartan (COZAAR) 25 MG tablet Take 1 tablet (25 mg total) by mouth 2 (two) times daily.  30 tablet  3  . metoprolol succinate (TOPROL-XL) 25 MG 24 hr tablet Take 3 tablets (75 mg total) by mouth daily. Take with or immediately following a meal.  90 tablet  3  .  spironolactone (ALDACTONE) 25 MG tablet Take 0.5 tablets (12.5 mg total) by mouth daily.  30 tablet  3   No current facility-administered medications for this encounter.     PHYSICAL EXAM: Filed Vitals:   09/20/13 1127  BP: 116/56  Pulse: 57  Weight: 163 lb 4 oz (74.05 kg)  SpO2: 98%    General:  Well appearing. No resp difficulty. Mom present  HEENT: normal Neck: supple. JVP flat. Carotids 2+ bilaterally; no bruits. No lymphadenopathy or thryomegaly appreciated. Cor: PMI normal. Regular rate & rhythm. No rubs, gallops.  3/6 diastolic murmur along the left  sternal border. Lungs: clear Abdomen: soft, nontender, nondistended. No hepatosplenomegaly. No bruits or masses. Good bowel sounds. Extremities: no cyanosis, clubbing, rash, edema Neuro: alert & orientedx3, cranial nerves grossly intact. Moves all 4 extremities w/o difficulty. Affect pleasant.  ASSESSMENT & PLAN: 1. Chronic systolic CHF: Suspect nonischemic cardiomyopathy.  Tachy-mediated CMP versus ETOH CMP versus long-standing aortic insufficiency or (most likely) a combination of all 3 possible causes.  He is no longer drinking and is back in NSR.  NYHA I-II.   Looks euvolemic on exam. Most recent echo showed EF 35-40% with moderately dilated LV.  - Continue current Lasix, losartan, Toprol XL, and spironolactone.   - BMET today.  2. Paroxysmal atrial fibrillation/flutter: s/p TEE/DCCV 08/01/13 . On Xarelto.  He remains in NSR. Will have Maze with AVR.  To start amiodarone prior to surgery.  3. Bicuspid aortic valve disorder with severe AI and severely dilated ascending aorta (5.1 cm on CTA).  He will need AVR + ascending aorta replacement.  He will have mechanical AVR. Plan for surgery in a week or so.  4. ETOH abuse: He is no longer drinking since discharge from hospital.  [name] 09/20/2013

## 2013-09-20 NOTE — Patient Instructions (Signed)
Lab today  Your physician recommends that you schedule a follow-up appointment in: 6 weeks  

## 2013-09-23 ENCOUNTER — Inpatient Hospital Stay (HOSPITAL_COMMUNITY)
Admission: RE | Admit: 2013-09-23 | Discharge: 2013-09-23 | Disposition: A | Discharge status: Home / Self Care | Payer: Self-pay | Source: Ambulatory Visit

## 2013-09-23 ENCOUNTER — Inpatient Hospital Stay (HOSPITAL_COMMUNITY): Admission: RE | Admit: 2013-09-23 | Payer: Self-pay | Source: Ambulatory Visit

## 2013-09-23 NOTE — Pre-Procedure Instructions (Signed)
[name] Hinton  09/23/2013   Your procedure is scheduled on:  Tuesday March 10 th at 0730 AM  Report to [facility]" at 0530 AM.  Call this number if you have problems the morning of surgery: [phone]   Remember:   Do not eat food or drink liquids after midnight Monday   Take these medicines the morning of surgery with A SIP OF WATER: Amiodarone(Pacerone),and Metoprolol(Toprol-XL)  Patient was instructed to stop Xarelto 7 days prior to surgery (09/20/13).   Do not wear jewelry.  Do not wear lotions, powders, or colognes. You may NOT wear deodorant.   Men may shave face and neck.  Do not bring valuables to the hospital.  [facility] is not responsible   for any belongings or valuables.               Contacts, dentures or bridgework may not be worn into surgery.  Leave suitcase in the car. After surgery it may be brought to your room.  For patients admitted to the hospital, discharge time is determined by your treatment team.               Patients discharged the day of surgery will not be allowed to drive home.    Special Instructions: [place] - Preparing for Surgery  Before surgery, you can play an important role.  Because skin is not sterile, your skin needs to be as free of germs as possible.  You can reduce the number of germs on you skin by washing with CHG (chlorahexidine gluconate) soap before surgery.  CHG is an antiseptic cleaner which kills germs and bonds with the skin to continue killing germs even after washing.  Please DO NOT use if you have an allergy to CHG or antibacterial soaps.  If your skin becomes reddened/irritated stop using the CHG and inform your nurse when you arrive at Short Stay.  Do not shave (including legs and underarms) for at least 48 hours prior to the first CHG shower.  You may shave your face.  Please follow these instructions carefully:   1.  Shower with CHG Soap the night before surgery and the  morning of Surgery.  2.  If  you choose to wash your hair, wash your hair first as usual with your normal shampoo.  3.  After you shampoo, rinse your hair and body thoroughly to remove the Shampoo.  4.  Use CHG as you would any other liquid soap.  You can apply chg directly        to the skin and wash gently with scrungie or a clean washcloth.  5.  Apply the CHG Soap to your body ONLY FROM THE NECK DOWN. Do not use on open wounds or open sores.  Avoid contact with your eyes,  ears, mouth and genitals (private parts).  Wash genitals (private parts) with your normal soap.  6.  Wash thoroughly, paying special attention to the area where your surgery will be performed.  7.  Thoroughly rinse your body with warm water from the neck down.  8.  DO NOT shower/wash with your normal soap after using and rinsing off the CHG Soap.  9.  Pat yourself dry with a clean towel.            10.  Wear clean pajamas.            11.  Place clean sheets on your bed the night of your first shower and do  not sleep with pets.          Day of Surgery  Do not apply any lotions/deoderants the morning of surgery.  Please wear clean clothes to the hospital/surgery center.      Please read over the following fact sheets that you were given: Pain Booklet, Coughing and Deep Breathing, Blood Transfusion Information, MRSA Information and Surgical Site Infection Prevention

## 2013-09-26 ENCOUNTER — Encounter: Payer: Self-pay | Admitting: Thoracic Surgery (Cardiothoracic Vascular Surgery)

## 2013-09-26 ENCOUNTER — Inpatient Hospital Stay (HOSPITAL_COMMUNITY)
Admission: RE | Admit: 2013-09-26 | Discharge: 2013-09-26 | Disposition: A | Discharge status: Home / Self Care | Payer: BC Managed Care – PPO | Source: Ambulatory Visit | Attending: Thoracic Surgery (Cardiothoracic Vascular Surgery) | Admitting: Thoracic Surgery (Cardiothoracic Vascular Surgery)

## 2013-09-26 ENCOUNTER — Encounter (HOSPITAL_COMMUNITY)
Admission: RE | Admit: 2013-09-26 | Discharge: 2013-09-26 | Disposition: A | Discharge status: Home / Self Care | Payer: BC Managed Care – PPO | Source: Ambulatory Visit | Attending: Thoracic Surgery (Cardiothoracic Vascular Surgery) | Admitting: Thoracic Surgery (Cardiothoracic Vascular Surgery)

## 2013-09-26 ENCOUNTER — Encounter (HOSPITAL_COMMUNITY): Payer: Self-pay

## 2013-09-26 ENCOUNTER — Ambulatory Visit (INDEPENDENT_AMBULATORY_CARE_PROVIDER_SITE_OTHER): Payer: BC Managed Care – PPO | Admitting: Thoracic Surgery (Cardiothoracic Vascular Surgery)

## 2013-09-26 VITALS — BP 103/53 | HR 53 | Resp 20 | Ht 68.0 in | Wt 165.0 lb

## 2013-09-26 VITALS — BP 104/70 | HR 53 | Temp 97.9°F | Resp 18 | Ht 68.0 in | Wt 165.0 lb

## 2013-09-26 DIAGNOSIS — Z01818 Encounter for other preprocedural examination: Secondary | ICD-10-CM | POA: Insufficient documentation

## 2013-09-26 DIAGNOSIS — Q2381 Bicuspid aortic valve: Secondary | ICD-10-CM

## 2013-09-26 DIAGNOSIS — Z01811 Encounter for preprocedural respiratory examination: Secondary | ICD-10-CM

## 2013-09-26 DIAGNOSIS — I359 Nonrheumatic aortic valve disorder, unspecified: Secondary | ICD-10-CM

## 2013-09-26 DIAGNOSIS — Z01810 Encounter for preprocedural cardiovascular examination: Secondary | ICD-10-CM | POA: Insufficient documentation

## 2013-09-26 DIAGNOSIS — I351 Nonrheumatic aortic (valve) insufficiency: Secondary | ICD-10-CM

## 2013-09-26 DIAGNOSIS — Z01812 Encounter for preprocedural laboratory examination: Secondary | ICD-10-CM | POA: Insufficient documentation

## 2013-09-26 DIAGNOSIS — I4891 Unspecified atrial fibrillation: Secondary | ICD-10-CM

## 2013-09-26 DIAGNOSIS — I712 Thoracic aortic aneurysm, without rupture, unspecified: Secondary | ICD-10-CM

## 2013-09-26 DIAGNOSIS — Q231 Congenital insufficiency of aortic valve: Secondary | ICD-10-CM

## 2013-09-26 HISTORY — DX: Cardiac murmur, unspecified: R01.1

## 2013-09-26 HISTORY — DX: Cardiac arrhythmia, unspecified: I49.9

## 2013-09-26 LAB — PULMONARY FUNCTION TEST
DL/VA % pred: 108 %
DL/VA: 4.94 ml/min/mmHg/L
DLCO cor % pred: 81 %
DLCO cor: 24.07 ml/min/mmHg
DLCO unc % pred: 79 %
DLCO unc: 23.44 ml/min/mmHg
FEF 25-75 Post: 2.47 L/sec
FEF 25-75 Pre: 1.38 L/sec
FEF2575-%Change-Post: 78 %
FEF2575-%Pred-Post: 65 %
FEF2575-%Pred-Pre: 36 %
FEV1-%Change-Post: 18 %
FEV1-%Pred-Post: 69 %
FEV1-%Pred-Pre: 58 %
FEV1-Post: 2.74 L
FEV1-Pre: 2.32 L
FEV1FVC-%Change-Post: 15 %
FEV1FVC-%Pred-Pre: 83 %
FEV6-%Change-Post: 3 %
FEV6-%Pred-Post: 73 %
FEV6-%Pred-Pre: 71 %
FEV6-Post: 3.55 L
FEV6-Pre: 3.44 L
FEV6FVC-%Change-Post: 1 %
FEV6FVC-%Pred-Post: 102 %
FEV6FVC-%Pred-Pre: 100 %
FVC-%Change-Post: 2 %
FVC-%Pred-Post: 71 %
FVC-%Pred-Pre: 70 %
FVC-Post: 3.56 L
FVC-Pre: 3.48 L
Post FEV1/FVC ratio: 77 %
Post FEV6/FVC ratio: 100 %
Pre FEV1/FVC ratio: 66 %
Pre FEV6/FVC Ratio: 99 %
RV % pred: 62 %
RV: 1.07 L
TLC % pred: 75 %
TLC: 4.94 L

## 2013-09-26 LAB — BLOOD GAS, ARTERIAL
Acid-base deficit: 0.6 mmol/L (ref 0.0–2.0)
Bicarbonate: 23.1 mEq/L (ref 20.0–24.0)
Drawn by: 206361
FIO2: 0.21 %
O2 Saturation: 98.3 %
Patient temperature: 98.6
TCO2: 24.1 mmol/L (ref 0–100)
pCO2 arterial: 34.9 mmHg — ABNORMAL LOW (ref 35.0–45.0)
pH, Arterial: 7.436 (ref 7.350–7.450)
pO2, Arterial: 104 mmHg — ABNORMAL HIGH (ref 80.0–100.0)

## 2013-09-26 LAB — PROTIME-INR
INR: 1.15 (ref 0.00–1.49)
Prothrombin Time: 14.5 seconds (ref 11.6–15.2)

## 2013-09-26 LAB — TYPE AND SCREEN
ABO/RH(D): B POS
Antibody Screen: NEGATIVE

## 2013-09-26 LAB — COMPREHENSIVE METABOLIC PANEL
ALT: 53 U/L (ref 0–53)
AST: 31 U/L (ref 0–37)
Albumin: 4.1 g/dL (ref 3.5–5.2)
Alkaline Phosphatase: 63 U/L (ref 39–117)
BUN: 26 mg/dL — ABNORMAL HIGH (ref 6–23)
CO2: 24 mEq/L (ref 19–32)
Calcium: 9.4 mg/dL (ref 8.4–10.5)
Chloride: 103 mEq/L (ref 96–112)
Creatinine, Ser: 1.08 mg/dL (ref 0.50–1.35)
GFR calc Af Amer: >90 mL/min (ref >90)
GFR calc non Af Amer: 84 mL/min — ABNORMAL LOW (ref >90)
Glucose, Bld: 115 mg/dL — ABNORMAL HIGH (ref 70–99)
Potassium: 4.1 mEq/L (ref 3.7–5.3)
Sodium: 142 mEq/L (ref 137–147)
Total Bilirubin: 0.7 mg/dL (ref 0.3–1.2)
Total Protein: 6.8 g/dL (ref 6.0–8.3)

## 2013-09-26 LAB — URINALYSIS, ROUTINE W REFLEX MICROSCOPIC
Bilirubin Urine: NEGATIVE
Glucose, UA: NEGATIVE mg/dL
Hgb urine dipstick: NEGATIVE
Ketones, ur: NEGATIVE mg/dL
Leukocytes, UA: NEGATIVE
Nitrite: NEGATIVE
Protein, ur: NEGATIVE mg/dL
Specific Gravity, Urine: 1.019 (ref 1.005–1.030)
Urobilinogen, UA: 0.2 mg/dL (ref 0.0–1.0)
pH: 5 (ref 5.0–8.0)

## 2013-09-26 LAB — HEMOGLOBIN A1C
Hgb A1c MFr Bld: 6 % — ABNORMAL HIGH (ref <5.7)
Mean Plasma Glucose: 126 mg/dL — ABNORMAL HIGH (ref <117)

## 2013-09-26 LAB — CBC
HCT: 39.7 % (ref 39.0–52.0)
Hemoglobin: 13.7 g/dL (ref 13.0–17.0)
MCH: 30.4 pg (ref 26.0–34.0)
MCHC: 34.5 g/dL (ref 30.0–36.0)
MCV: 88 fL (ref 78.0–100.0)
Platelets: 197 10*3/uL (ref 150–400)
RBC: 4.51 MIL/uL (ref 4.22–5.81)
RDW: 14.8 % (ref 11.5–15.5)
WBC: 6.4 10*3/uL (ref 4.0–10.5)

## 2013-09-26 LAB — SURGICAL PCR SCREEN
MRSA, PCR: NEGATIVE
Staphylococcus aureus: POSITIVE — AB

## 2013-09-26 LAB — ABO/RH: ABO/RH(D): B POS

## 2013-09-26 LAB — APTT: aPTT: 28 seconds (ref 24–37)

## 2013-09-26 MED ORDER — MAGNESIUM SULFATE 50 % IJ SOLN
40.0000 meq | INTRAMUSCULAR | Status: DC
  Filled 2013-09-26: qty 10

## 2013-09-26 MED ORDER — NITROGLYCERIN IN D5W 200-5 MCG/ML-% IV SOLN
2.0000 ug/min | INTRAVENOUS | Status: DC
Start: 2013-09-27 — End: 2013-09-27
  Filled 2013-09-26: qty 250

## 2013-09-26 MED ORDER — SODIUM CHLORIDE 0.9 % IV SOLN
INTRAVENOUS | Status: AC
  Administered 2013-09-27: 1.7 [IU]/h via INTRAVENOUS
  Filled 2013-09-26: qty 1

## 2013-09-26 MED ORDER — DOPAMINE-DEXTROSE 3.2-5 MG/ML-% IV SOLN
2.0000 ug/kg/min | INTRAVENOUS | Status: AC
  Administered 2013-09-27: 3 ug/kg/min via INTRAVENOUS
  Filled 2013-09-26: qty 250

## 2013-09-26 MED ORDER — PHENYLEPHRINE HCL 10 MG/ML IJ SOLN
30.0000 ug/min | INTRAVENOUS | Status: DC
  Filled 2013-09-26: qty 2

## 2013-09-26 MED ORDER — VANCOMYCIN HCL 1000 MG IV SOLR
INTRAVENOUS | Status: AC
  Administered 2013-09-27: 09:00:00
  Filled 2013-09-26: qty 1000

## 2013-09-26 MED ORDER — METOPROLOL TARTRATE 12.5 MG HALF TABLET: 12.5000 mg | ORAL_TABLET | Freq: Once | ORAL | Status: DC

## 2013-09-26 MED ORDER — DEXTROSE 5 % IV SOLN
1.5000 g | INTRAVENOUS | Status: AC
  Administered 2013-09-27: .75 g via INTRAVENOUS
  Administered 2013-09-27: 1.5 g via INTRAVENOUS
  Filled 2013-09-26 (×2): qty 1.5

## 2013-09-26 MED ORDER — ALBUTEROL SULFATE (2.5 MG/3ML) 0.083% IN NEBU
2.5000 mg | INHALATION_SOLUTION | Freq: Once | RESPIRATORY_TRACT | Status: AC
  Administered 2013-09-26: 2.5 mg via RESPIRATORY_TRACT

## 2013-09-26 MED ORDER — CHLORHEXIDINE GLUCONATE 4 % EX LIQD: 30.0000 mL | CUTANEOUS | Status: DC

## 2013-09-26 MED ORDER — VANCOMYCIN HCL 10 G IV SOLR
1250.0000 mg | INTRAVENOUS | Status: AC
  Administered 2013-09-27: 1250 mg via INTRAVENOUS
  Filled 2013-09-26: qty 1250

## 2013-09-26 MED ORDER — DEXTROSE 5 % IV SOLN
750.0000 mg | INTRAVENOUS | Status: DC
  Filled 2013-09-26: qty 750

## 2013-09-26 MED ORDER — SODIUM CHLORIDE 0.9 % IV SOLN
INTRAVENOUS | Status: AC
  Administered 2013-09-27: 14 mL/h via INTRAVENOUS
  Administered 2013-09-27: 70 mL/h via INTRAVENOUS
  Filled 2013-09-26: qty 40

## 2013-09-26 MED ORDER — SODIUM CHLORIDE 0.9 % IV SOLN
INTRAVENOUS | Status: DC
  Filled 2013-09-26: qty 30

## 2013-09-26 MED ORDER — EPINEPHRINE HCL 1 MG/ML IJ SOLN
0.5000 ug/min | INTRAVENOUS | Status: DC
  Filled 2013-09-26: qty 4

## 2013-09-26 MED ORDER — POTASSIUM CHLORIDE 2 MEQ/ML IV SOLN
80.0000 meq | INTRAVENOUS | Status: DC
  Filled 2013-09-26: qty 40

## 2013-09-26 MED ORDER — PLASMA-LYTE 148 IV SOLN
INTRAVENOUS | Status: AC
  Administered 2013-09-27: 09:00:00
  Filled 2013-09-26: qty 2.5

## 2013-09-26 MED ORDER — DEXMEDETOMIDINE HCL IN NACL 400 MCG/100ML IV SOLN
0.1000 ug/kg/h | INTRAVENOUS | Status: AC
  Administered 2013-09-27: 0.2 ug/kg/h via INTRAVENOUS
  Filled 2013-09-26: qty 100

## 2013-09-26 NOTE — Anesthesia Preprocedure Evaluation (Addendum)
Anesthesia Evaluation  Patient identified by MRN, date of birth, ID band Patient awake    Reviewed: Allergy & Precautions, H&P , NPO status , Patient's Chart, lab work & pertinent test results, reviewed documented beta blocker date and time   Airway Mallampati: II TM Distance: >3 FB Neck ROM: Full    Dental no notable dental hx. (+) Teeth Intact, Dental Advisory Given   Pulmonary neg pulmonary ROS, former smoker,  breath sounds clear to auscultation  Pulmonary exam normal       Cardiovascular hypertension, On Medications and On Home Beta Blockers + Peripheral Vascular Disease and +CHF + dysrhythmias Atrial Fibrillation + Valvular Problems/Murmurs AI Rhythm:Regular Rate:Normal     Neuro/Psych negative neurological ROS  negative psych ROS   GI/Hepatic negative GI ROS, Neg liver ROS,   Endo/Other  negative endocrine ROS  Renal/GU negative Renal ROS  negative genitourinary   Musculoskeletal   Abdominal   Peds  Hematology negative hematology ROS (+)   Anesthesia Other Findings   Reproductive/Obstetrics negative OB ROS                         Anesthesia Physical Anesthesia Plan  ASA: IV  Anesthesia Plan: General   Post-op Pain Management:    Induction: Intravenous  Airway Management Planned: Oral ETT  Additional Equipment: Arterial line, CVP, PA Cath, TEE, 3D TEE and Ultrasound Guidance Line Placement  Intra-op Plan:   Post-operative Plan: Post-operative intubation/ventilation  Informed Consent: I have reviewed the patients History and Physical, chart, labs and discussed the procedure including the risks, benefits and alternatives for the proposed anesthesia with the patient or authorized representative who has indicated his/her understanding and acceptance.   Dental advisory given  Plan Discussed with: CRNA  Anesthesia Plan Comments:         Anesthesia Quick Evaluation

## 2013-09-26 NOTE — H&P (Signed)
[address]       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 Transthoracic echocardiogram revealed bicuspid aortic valve with severe aortic insufficiency and dilated left ventricle with severe left ventricular systolic dysfunction with ejection fraction 25%. 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 The patient's congestive heart failure improved with rate control and diuresis, and he underwent DC cardioversion, successfully converting him to sinus rhythm. Transesophageal echocardiogram performed at the time of cardioversion confirmed the presence of bicuspid aortic valve with severe aortic insufficiency. Cardiothoracic surgical consultation was requested.   He was originally seen in consultation during his hospitalization on 08/01/2013.  He has been anticoagulated using Xarelto since that time. He is clinically done well since hospital discharge and has maintained sinus rhythm.  He subsequently underwent left and right heart catheterization by Dr. Aundra Dubin on 09/05/2013.  He was found to have no significant coronary artery disease but he did have elevated pulmonary catheter wedge pressure and left ventricular end-diastolic pressure with moderate pulmonary hypertension. He returns to the office today to discuss possible elective surgical intervention.  He is been cleared for surgery by Dr.  Lawana Chambers  The patient states that he has been healthy and physically active all of his life. He describes a 3 month history of progressive symptoms of shortness of breath which gradually got worse until he presented last week with resting shortness of breath, chest discomfort, and orthopnea. He has never been told that he had a heart murmur in the past and he has never had any problems with physical activity or exercise intolerance. He states that over the last few months symptoms have waxed and waned. He initially thought that may be related to alcohol, and he admits to drinking as much as 25 alcoholic drinks per week for the last 20 years. The patient also reports a history of cigarette smoking, although he quit 4 years ago. 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 He has been abstinent from the use of alcohol for the past 4 weeks.  The patient reports that he has been feeling quite well over the past month. His breathing is much better than it was at the time of his hospitalization in early January. He denies any exertional shortness of breath, PND, orthopnea, or lower extremity edema. He has never had any chest discomfort. He has not had any tachypalpitations or dizzy spells.   Past Medical History  Diagnosis Date  . ETOH abuse   . Systolic CHF     a. Dx 07/2013: EF 25%, felt likely related to combo of EtOH + tachy-mediated + AI. LHC pending.  [name] PAF (paroxysmal atrial fibrillation)     a. Dx 07/2013, s/p TEE/DCCV.  . Paroxysmal atrial flutter     a. Dx 07/2013.  [name] Severe aortic insufficiency     a. Bicuspid aortic valve with  severe AI, dilated ascending aorta dx 07/2013.  [name] Heart murmur   . Dysrhythmia     Past Surgical History  Procedure Laterality Date  . No past surgeries    . Tee without cardioversion N/A 08/01/2013    Procedure: TRANSESOPHAGEAL ECHOCARDIOGRAM (TEE);  Surgeon: [name], MD;  Location: [facility];  Service: Cardiovascular;   Laterality: N/A;  . Cardioversion N/A 08/01/2013    Procedure: CARDIOVERSION;  Surgeon: [name], MD;  Location: [facility];  Service: Cardiovascular;  Laterality: N/A;  . Cardiac catheterization      Family History  Problem Relation Age of Onset  . Heart failure Father   . Coronary artery disease Father     CABG    Social History History  Substance Use Topics  . Smoking status: Former Smoker -- 1.50 packs/day for 20 years    Quit date: 09/25/2009  . Smokeless tobacco: Never Used  . Alcohol Use: No     Comment: 25 drinks/week - occasional in 2 months.only 1 drink in past 2 months-09/23/13    Prior to Admission medications   Medication Sig Start Date End Date Taking? Authorizing Provider  amoxicillin (AMOXIL) 500 MG capsule Take four capsules one hour before dental appointment. 08/11/13  Yes Lenn Cal, DDS  chlorhexidine (PERIDEX) 0.12 % solution Rinse with 15 mls twice daily for 30 seconds. Use after breakfast and at bedtime. Spit out excess. Do not swallow. 08/11/13  Yes Lenn Cal, DDS  furosemide (LASIX) 40 MG tablet Take 40 mg by mouth 2 (two) times daily. 08/10/13  Yes Amy D Clegg, NP  losartan (COZAAR) 25 MG tablet Take 1 tablet (25 mg total) by mouth 2 (two) times daily. 08/10/13  Yes Amy D Clegg, NP  metoprolol succinate (TOPROL-XL) 25 MG 24 hr tablet Take 3 tablets (75 mg total) by mouth daily. Take with or immediately following a meal. 08/10/13  Yes Amy D Clegg, NP  spironolactone (ALDACTONE) 25 MG tablet Take 0.5 tablets (12.5 mg total) by mouth daily. 08/01/13  Yes Dayna N Dunn, PA-C  amiodarone (PACERONE) 200 MG tablet Take 200 mg by mouth 2 (two) times daily. Begin 7 days prior to surgery. 09/20/13   [name], MD    No Known Allergies   Review of Systems:              General:                      normal appetite, normal energy, no weight gain, no weight loss, no fever             Cardiac:                      no chest pain with exertion, no  chest pain at rest, + SOB with exertion, + resting SOB at the time of admission now improved, + PND, + orthopnea, + palpitations, + arrhythmia, + atrial fibrillation, no LE edema, no dizzy spells, no syncope             Respiratory:                + shortness of breath, no home oxygen, + productive cough, + dry cough, no bronchitis, no wheezing, no hemoptysis, no asthma, no pain with inspiration or cough, no sleep apnea, no CPAP at night             GI:  no difficulty swallowing, no reflux, no frequent heartburn, no hiatal hernia, no abdominal pain, no constipation, no diarrhea, no hematochezia, no hematemesis, no melena             GU:                              no dysuria,  no frequency, no urinary tract infection, no hematuria, no enlarged prostate, no kidney stones, no kidney disease             Vascular:                     no pain suggestive of claudication, no pain in feet, no leg cramps, no varicose veins, no DVT, no non-healing foot ulcer             Neuro:                         no stroke, no TIA's, no seizures, no headaches, no temporary blindness one eye,  no slurred speech, no peripheral neuropathy, no chronic pain, no instability of gait, no memory/cognitive dysfunction             Musculoskeletal:         no arthritis, no joint swelling, no myalgias, no difficulty walking, normal mobility               Skin:                            no rash, no itching, no skin infections, no pressure sores or ulcerations             Psych:                         no anxiety, no depression, no nervousness, no unusual recent stress             Eyes:                           no blurry vision, no floaters, no recent vision changes, no wears glasses or contacts             ENT:                            no hearing loss, no loose or painful teeth, no dentures, last saw dentist many years ago             Hematologic:               no easy bruising, no abnormal bleeding, no  clotting disorder, no frequent epistaxis             Endocrine:                   no diabetes, does not check CBG's at home                           Physical Exam:              BP 97/47  Pulse 70  Temp(Src) 97.3 F (36.3 C) (Oral)  Resp 18  Ht 5\' 8"  (1.727 m)  Wt 68.221 kg (150 lb 6.4 oz)  BMI 22.87 kg/m2  SpO2 100%             General:                      Thin,  well-appearing             HEENT:                       Unremarkable               Neck:                           no JVD, no bruits, no adenopathy               Chest:                         clear to auscultation, symmetrical breath sounds, no wheezes, no rhonchi               CV:                              RRR, grade III/VI diastolic murmur best at RSB             Abdomen:                    soft, non-tender, no masses               Extremities:                 warm, well-perfused, pulses palpable             Rectal/GU                   Deferred             Neuro:                         Grossly non-focal and symmetrical throughout             Skin:                            Clean and dry, no rashes, no breakdown  Diagnostic Tests:  Transthoracic Echocardiography  Patient: Tully, Mcinturff MR #: [id number] Study Date: 07/29/2013 Gender: M Age: 24 Height: 172.7cm Weight: 70.3kg BSA: 1.80m^2 Pt. Status: Room: [place] Michel Bickers, [place] ATTENDING Doyle Askew, [place] Gaylene Brooks, M.D. PERFORMING [facility], Inpatient cc:  ------------------------------------------------------------ LV EF: 25%  ------------------------------------------------------------ Indications: CHF - 428.0.  ------------------------------------------------------------ History: PMH: History of ETOH abuse. Atrial flutter. Acute heart failure. Dyspnea.  ------------------------------------------------------------ Study Conclusions  - Left ventricle: The cavity size was moderately dilated. Wall  thickness was normal. The estimated ejection fraction was 25%. Diffuse hypokinesis. Indeterminant diastolic function (atrial flutter). - Aortic valve: Aortic valve leaflets mal-coapt. Bicuspid. There was no stenosis. There is very eccentric aortic insufficiency. It could be severe. Doppler flow in descending aorta is difficult to interpret due to atrial flutter. - Aorta: The aortic root is borderline dilated. The ascending aorta is not seen well. - Mitral valve: Mild regurgitation. - Left atrium: The atrium was moderately dilated. - Right ventricle: The cavity size was mildly dilated. Systolic function was moderately  reduced. - Right atrium: The atrium was moderately dilated. - Tricuspid valve: Peak RV-RA gradient: 21mm Hg (S). - Pulmonary arteries: PA peak pressure: 30mm Hg (S). - Systemic veins: IVC measured 2.0 cm with < 50% respirophasic variation, suggesting RA pressure 8 mmHg. Impressions:  - Moderately dilated LV with severe global hypokinesis, EF 25%. Mildly dilated RV with moderately decreased systolic function. The aortic valve is bicuspid and mal-coapts. There is significant eccentric aortic insufficiency. AI may be severe. The patient needsa TEE to assess the aortic insufficiency. The LV dilation/dysfunction could potentially be secondary to severe AI, but given co-existing RV dysfunction would be concerned for a more systemic process (ETOH cardiomyopathy). Transthoracic echocardiography. M-mode, complete 2D, spectral Doppler, and color Doppler. Height: Height: 172.7cm. Height: 68in. Weight: Weight: 70.3kg. Weight: 154.7lb. Body mass index: BMI: 23.6kg/m^2. Body surface area: BSA: 1.26m^2. Blood pressure: 103/57. Patient status: Inpatient. Location: Bedside.  ------------------------------------------------------------  ------------------------------------------------------------ Left ventricle: The cavity size was moderately dilated. Wall thickness was normal.  The estimated ejection fraction was 25%. Diffuse hypokinesis. Indeterminant diastolic function (atrial flutter).  ------------------------------------------------------------ Aortic valve: Aortic valve leaflets mal-coapt. Bicuspid. Doppler: There was no stenosis. There is very eccentric aortic insufficiency. It could be severe. Doppler flow in descending aorta is difficult to interpret due to atrial flutter.  ------------------------------------------------------------ Aorta: The aortic root is borderline dilated. The ascending aorta is not seen well.  ------------------------------------------------------------ Mitral valve: Normal thickness leaflets . Doppler: There was no evidence for stenosis. Mild regurgitation.  ------------------------------------------------------------ Left atrium: The atrium was moderately dilated.  ------------------------------------------------------------ Right ventricle: The cavity size was mildly dilated. Systolic function was moderately reduced.  ------------------------------------------------------------ Pulmonic valve: Structurally normal valve. Cusp separation was normal. Doppler: Transvalvular velocity was within the normal range. No regurgitation.  ------------------------------------------------------------ Tricuspid valve: Doppler: Trivial regurgitation.  ------------------------------------------------------------ Right atrium: The atrium was moderately dilated.  ------------------------------------------------------------ Pericardium: There was no pericardial effusion.  ------------------------------------------------------------ Systemic veins: IVC measured 2.0 cm with < 50% respirophasic variation, suggesting RA pressure 8 mmHg.  ------------------------------------------------------------ Post procedure conclusions Ascending Aorta:  - The aortic root is borderline dilated. The ascending aorta is not seen  well.  ------------------------------------------------------------  2D measurements Normal Doppler measurements Normal Left ventricle Main pulmonary LVID ED, 67.9 mm 43-52 artery chord, Pressure, S 30 mm =30 PLAX Hg LVID ES, 63.1 mm 23-38 Left ventricle chord, Ea, lat ann, 7.29 cm/s ------ PLAX tiss DP FS, chord, 7 % >29 Tricuspid valve PLAX Regurg peak 235 cm/s ------ LVPW, ED 14.9 mm ------ vel IVS/LVPW 0.95 <1.3 Peak RV-RA 22 mm ------ ratio, ED gradient, S Hg Ventricular septum Max regurg 235 cm/s ------ IVS, ED 14.1 mm ------ vel Aorta Right ventricle Root diam, 37 mm ------ Pressure, S 27 mm <30 ED Hg Left atrium Sa vel, lat 7.07 cm/s ------ AP dim 49 mm ------ ann, tiss DP AP dim 2.66 cm/m^2 <2.2 index  ------------------------------------------------------------ Prepared and Electronically Authenticated by  [name] [id number]   Procedure: TEE  Indication: Atrial fibrillation, aortic insufficiency with bicuspid aortic valve, dilated ascending aorta   Sedation: Versed 6 mg IV, Fentanyl 75 mcg IV   Findings: Please see echo section for full report. Moderately dilated LV with severe systolic dysfunction, EF 123456. Global hypokinesis. Normal RV size with moderately decreased systolic function. The aortic valve was bicuspid with probably severe eccentric aortic insufficiency. The ascending aorta was dilated to 5.0 cm by TEE. Mild mitral regurgitation. No LAA thrombus.   Proceed to DCCV.   [name]   08/01/2013   1:35 PM  CT ANGIOGRAPHY CHEST WITH CONTRAST  TECHNIQUE:   Multidetector CT imaging of the chest was performed using the   standard protocol during bolus administration of intravenous   contrast. Multiplanar CT image reconstructions including MIPs were   obtained to evaluate the vascular anatomy.   CONTRAST: 175mL OMNIPAQUE IOHEXOL 350 MG/ML SOLN   COMPARISON: DG CHEST 1V PORT dated 07/28/2013   FINDINGS:   There are no filling  defects within the pulmonary arteries to   suggest acute pulmonary embolism. No acute findings of the aorta or   great vessels. The ascending aorta is dilated to 5.1 cm (sagittal   image 84). The left ventricle is hypertrophied. No pericardial   fluid.   No axillary or supraclavicular lymphadenopathy. There is mild   paratracheal lymphadenopathy lower paratracheal lymph node measuring   up to 1.5 cm short axis. .   No overt pulmonary edema. There is interlobular septal thickening   consistent with interstitial edema. No pleural fluid.   Limited view of the upper abdomen is unremarkable. Limited view of   the skeleton unremarkable.   Review of the MIP images confirms the above findings. It   IMPRESSION:   1. No acute pulmonary embolism.   2. Left ventricular hypertrophy advanced for age. Recommend   cardiology consultation for potential heart failure in this young   patient.   3. Interlobular septal thickening consistent with interstitial   edema.   4. Mild paratracheal adenopathy is likely reactive.   5. 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 Recommend   non emergent cardiothoracic surgery consultation.   Electronically Signed   By: Suzy Bouchard M.D.   On: 07/28/2013 16:43    Cardiac Catheterization Procedure Note  Name: BRYAN GOIN   MRN: [id number]   DOB: Dec 19, 1970   Procedure: Right Heart Cath, Left Heart Cath, Selective Coronary Angiography   Indication: Severe AI, pre-surgery   Procedural Details: Allen's test was positive on the right arm. The radial and brachial areas were prepped, draped, and anesthetized with 1% lidocaine. There was an IV in the right brachial vein that was exchanged out for a 5 French venous sheath. Using the modified Seldinger technique a 5 French sheath was placed in the right radial artery. The patient received intraarterial verapamil 3 mg and weight-based IV heparin bolus. A Swan-Ganz catheter was used for the right heart  catheterization. Standard protocol was followed for recording of right heart pressures and sampling of oxygen saturations. Fick cardiac output was calculated. Standard Judkins catheters were used for selective coronary angiography. Left ventriculography and aortic root shot were not done due to elevated LVEDP. There were no immediate procedural complications. The patient was transferred to the post catheterization recovery area for further monitoring.   Procedural Findings:   Hemodynamics (mmHg)   RA mean 5   RV 69/10   PA 62/25, mean 39   PCWP mean 22   LV 115/35   AO 111/50   Oxygen saturations:   PA 59%   AO 99%   Cardiac Output (Fick) 3.28   Cardiac Index (Fick) 1.8   PVR 5.2 WU   Coronary angiography:   Coronary dominance: right   Left mainstem: No significant disease.   Left anterior descending (LAD): Luminal irregularities in the distal LAD.   Left circumflex (LCx): There was a large ramus. No significant disease in the LCx system.   Right coronary artery (RCA): Difficult to engage due to location and to catheter whip with severe aortic insufficiency.  There was no significant CAD.   Left ventriculography: Not done. LVEDP was very high with severe AI. For the same reason, aortic root shot was not done.   Final Conclusions: No significant coronary disease. PCWP and LVEDP elevated in the setting of severe AI with moderate pulmonary hypertension (suspect mostly pulmonary venous hypertension but with PVR 5.1 WU there may be a component of vascular remodeling). EF 35-40% on last echo, somewhat improved. CI was measured as low, not sure accuracy given how well patient is doing symptomatically. Today, I will increase Lasix back to 40 mg bid. I will see him back in the CHF clinic in 10-14 days. He will followup with Dr. Roxy Manns for AVR/ascending aorta repair.   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 The patient has severe global left ventricular chamber enlargement and systolic dysfunction with ejection fraction estimated 25% by echocardiogram in January. At that time the patient was in atrial flutter, and he has now been successfully cardioverted back to sinus rhythm, and ejection fraction appeared slightly improved at the time of recent catheterization. He will require aortic root replacement, and based upon the severity of aortic insufficiency and asymmetric bicuspid valve morphology, I do not feel that there is a role for a valve sparing procedure. Given the patient's relatively young age a pulmonary autograft aortic root replacement (Ross procedure) could be considered, although this might be associated with suboptimal long-term outcome in the setting of severe aortic insufficiency with dilated aortic root.  Furthermore, given the severity of left ventricular dysfunction and presence of significant pulmonary hypertension I would be even more reluctant to consider the Ross procedure an option. Finally, concomitant Maze procedure might be reasonable under the circumstances to decrease the patient's risk of future recurrence of atrial fibrillation or atrial flutter.   Plan:  The patient was counseled at length regarding surgical alternatives with respect to aortic valve replacement including continued medical therapy versus proceeding with conventional surgical aortic valve replacement using either a mechanical prosthesis or a bioprosthetic tissue valve.  Other alternatives including the Ross autograft procedure, homograft aortic root replacement, stentless bioprosthetic tissue valve replacement, valve repair, and transcatheter aortic valve replacement were discussed.  Discussion was held comparing the relative risks of mechanical valve replacement  with need for lifelong anticoagulation versus use of a bioprosthetic tissue valve and the associated potential for late structural valve deterioration in failure.  This discussion was placed in the context of the patient's particular circumstances, and as a result the patient specifically requests that their valve be replaced using a mechanical valve-conduit.  The rationale for concomitant Maze procedure has been discussed as well as all associated risks and benefits.  The patient understands and accepts all potential associated risks of surgery including but not limited to risk of death, stroke, myocardial infarction, congestive heart failure, respiratory failure, renal failure, pneumonia, bleeding requiring blood transfusion and or reexploration, arrhythmia, heart block or bradycardia requiring permanent pacemaker, aortic dissection or other major vascular complication, pleural effusions or other delayed complications related to continued congestive heart failure, and other late complications related to valve replacement including structural valve deterioration and failure, thrombosis, endocarditis, or paravalvular leak.  We plan to proceed with surgery on Tuesday, 09/27/2013.  He has been instructed to stop  taking Xarelto 7 days prior to surgery.  We will begin him on amiodarone prophylactically at that time.    Valentina Gu. Roxy Manns, MD

## 2013-09-26 NOTE — Progress Notes (Signed)
      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 He was originally seen in consultation during his hospitalization on 08/01/2013 and he was seen most recently in the office on 09/12/2013. He returns to the office today with plans to proceed with elective surgery tomorrow. He reports no new problems or complaints.    Current Outpatient Prescriptions  Medication Sig Dispense Refill  . amiodarone (PACERONE) 200 MG tablet Take 200 mg by mouth 2 (two) times daily. Begin 7 days prior to surgery.      [name] amoxicillin (AMOXIL) 500 MG capsule Take four capsules one hour before dental appointment.  4 capsule  1  . chlorhexidine (PERIDEX) 0.12 % solution Rinse with 15 mls twice daily for 30 seconds. Use after breakfast and at bedtime. Spit out excess. Do not swallow.  480 mL  prn  . furosemide (LASIX) 40 MG tablet Take 40 mg by mouth 2 (two) times daily.      [name] losartan (COZAAR) 25 MG tablet Take 1 tablet (25 mg total) by mouth 2 (two) times daily.  30 tablet  3  . metoprolol succinate (TOPROL-XL) 25 MG 24 hr tablet Take 3 tablets (75 mg total) by mouth daily. Take with or immediately following a meal.  90 tablet  3  . spironolactone (ALDACTONE) 25 MG tablet Take 0.5 tablets (12.5 mg total) by mouth daily.  30 tablet  3   No current facility-administered medications for this visit.      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 The patient has severe global left ventricular chamber enlargement and systolic dysfunction with ejection fraction estimated 25% by echocardiogram in January. At that time the patient was in atrial flutter, and he has now been successfully cardioverted back to sinus rhythm, and ejection fraction appeared slightly improved at the time of recent catheterization.    Plan:  We plan to proceed with Bentall aortic root replacement using a mechanical valve conduit with concomitant Maze procedure in the operating room tomorrow. I have again reviewed the indications, risks, and potential benefits of surgery. All of his questions been addressed.   Valentina Gu. Roxy Manns, MD 09/26/2013 11:29 AM

## 2013-09-26 NOTE — Patient Instructions (Signed)
Take only Toprol XL in the morning tomorrow with a sip of water

## 2013-09-27 ENCOUNTER — Inpatient Hospital Stay (HOSPITAL_COMMUNITY): Payer: BC Managed Care – PPO

## 2013-09-27 ENCOUNTER — Inpatient Hospital Stay (HOSPITAL_COMMUNITY)
Admission: RE | Admit: 2013-09-27 | Discharge: 2013-10-03 | DRG: 220 | Disposition: A | Discharge status: Home / Self Care | Payer: BC Managed Care – PPO | Source: Ambulatory Visit | Attending: Thoracic Surgery (Cardiothoracic Vascular Surgery) | Admitting: Thoracic Surgery (Cardiothoracic Vascular Surgery)

## 2013-09-27 ENCOUNTER — Other Ambulatory Visit: Payer: Self-pay

## 2013-09-27 ENCOUNTER — Encounter (HOSPITAL_COMMUNITY): Payer: Self-pay | Admitting: *Deleted

## 2013-09-27 ENCOUNTER — Encounter (HOSPITAL_COMMUNITY)
Admission: RE | Disposition: A | Discharge status: Home / Self Care | Payer: BC Managed Care – PPO | Source: Ambulatory Visit | Attending: Thoracic Surgery (Cardiothoracic Vascular Surgery)

## 2013-09-27 ENCOUNTER — Encounter (HOSPITAL_COMMUNITY): Payer: BC Managed Care – PPO | Admitting: Anesthesiology

## 2013-09-27 ENCOUNTER — Inpatient Hospital Stay (HOSPITAL_COMMUNITY): Payer: BC Managed Care – PPO | Admitting: Anesthesiology

## 2013-09-27 DIAGNOSIS — I509 Heart failure, unspecified: Secondary | ICD-10-CM

## 2013-09-27 DIAGNOSIS — Q2381 Bicuspid aortic valve: Secondary | ICD-10-CM

## 2013-09-27 DIAGNOSIS — I359 Nonrheumatic aortic valve disorder, unspecified: Principal | ICD-10-CM

## 2013-09-27 DIAGNOSIS — I480 Paroxysmal atrial fibrillation: Secondary | ICD-10-CM | POA: Diagnosis present

## 2013-09-27 DIAGNOSIS — I059 Rheumatic mitral valve disease, unspecified: Secondary | ICD-10-CM | POA: Diagnosis present

## 2013-09-27 DIAGNOSIS — I712 Thoracic aortic aneurysm, without rupture, unspecified: Secondary | ICD-10-CM | POA: Diagnosis present

## 2013-09-27 DIAGNOSIS — I428 Other cardiomyopathies: Secondary | ICD-10-CM

## 2013-09-27 DIAGNOSIS — I4891 Unspecified atrial fibrillation: Secondary | ICD-10-CM

## 2013-09-27 DIAGNOSIS — I5022 Chronic systolic (congestive) heart failure: Secondary | ICD-10-CM | POA: Diagnosis present

## 2013-09-27 DIAGNOSIS — I739 Peripheral vascular disease, unspecified: Secondary | ICD-10-CM | POA: Diagnosis present

## 2013-09-27 DIAGNOSIS — J9819 Other pulmonary collapse: Secondary | ICD-10-CM | POA: Diagnosis not present

## 2013-09-27 DIAGNOSIS — Z8249 Family history of ischemic heart disease and other diseases of the circulatory system: Secondary | ICD-10-CM

## 2013-09-27 DIAGNOSIS — D62 Acute posthemorrhagic anemia: Secondary | ICD-10-CM | POA: Diagnosis not present

## 2013-09-27 DIAGNOSIS — Z8679 Personal history of other diseases of the circulatory system: Secondary | ICD-10-CM

## 2013-09-27 DIAGNOSIS — Z79899 Other long term (current) drug therapy: Secondary | ICD-10-CM

## 2013-09-27 DIAGNOSIS — R011 Cardiac murmur, unspecified: Secondary | ICD-10-CM | POA: Diagnosis present

## 2013-09-27 DIAGNOSIS — I4892 Unspecified atrial flutter: Secondary | ICD-10-CM | POA: Diagnosis present

## 2013-09-27 DIAGNOSIS — I10 Essential (primary) hypertension: Secondary | ICD-10-CM | POA: Diagnosis present

## 2013-09-27 DIAGNOSIS — Z9889 Other specified postprocedural states: Secondary | ICD-10-CM

## 2013-09-27 DIAGNOSIS — I2789 Other specified pulmonary heart diseases: Secondary | ICD-10-CM | POA: Diagnosis present

## 2013-09-27 DIAGNOSIS — R599 Enlarged lymph nodes, unspecified: Secondary | ICD-10-CM | POA: Diagnosis present

## 2013-09-27 DIAGNOSIS — I77819 Aortic ectasia, unspecified site: Secondary | ICD-10-CM | POA: Diagnosis present

## 2013-09-27 DIAGNOSIS — Z87891 Personal history of nicotine dependence: Secondary | ICD-10-CM

## 2013-09-27 DIAGNOSIS — Q231 Congenital insufficiency of aortic valve: Secondary | ICD-10-CM

## 2013-09-27 DIAGNOSIS — Z952 Presence of prosthetic heart valve: Secondary | ICD-10-CM

## 2013-09-27 DIAGNOSIS — F1010 Alcohol abuse, uncomplicated: Secondary | ICD-10-CM | POA: Diagnosis present

## 2013-09-27 DIAGNOSIS — Z954 Presence of other heart-valve replacement: Secondary | ICD-10-CM

## 2013-09-27 DIAGNOSIS — I498 Other specified cardiac arrhythmias: Secondary | ICD-10-CM | POA: Diagnosis not present

## 2013-09-27 DIAGNOSIS — I351 Nonrheumatic aortic (valve) insufficiency: Secondary | ICD-10-CM | POA: Diagnosis present

## 2013-09-27 DIAGNOSIS — K5900 Constipation, unspecified: Secondary | ICD-10-CM | POA: Diagnosis not present

## 2013-09-27 HISTORY — DX: Presence of other heart-valve replacement: Z95.4

## 2013-09-27 HISTORY — PX: ASCENDING AORTIC ROOT REPLACEMENT: SHX5729

## 2013-09-27 HISTORY — DX: Personal history of other diseases of the circulatory system: Z86.79

## 2013-09-27 HISTORY — DX: Other specified postprocedural states: Z98.890

## 2013-09-27 HISTORY — DX: Other cardiomyopathies: I42.8

## 2013-09-27 HISTORY — PX: MAZE: SHX5063

## 2013-09-27 HISTORY — PX: INTRAOPERATIVE TRANSESOPHAGEAL ECHOCARDIOGRAM: SHX5062

## 2013-09-27 HISTORY — PX: BENTALL PROCEDURE: SHX5058

## 2013-09-27 LAB — POCT I-STAT 4, (NA,K, GLUC, HGB,HCT)
Glucose, Bld: 116 mg/dL — ABNORMAL HIGH (ref 70–99)
Glucose, Bld: 103 mg/dL — ABNORMAL HIGH (ref 70–99)
Glucose, Bld: 112 mg/dL — ABNORMAL HIGH (ref 70–99)
Glucose, Bld: 114 mg/dL — ABNORMAL HIGH (ref 70–99)
Glucose, Bld: 135 mg/dL — ABNORMAL HIGH (ref 70–99)
Glucose, Bld: 93 mg/dL (ref 70–99)
HCT: 33 % — ABNORMAL LOW (ref 39.0–52.0)
HCT: 26 % — ABNORMAL LOW (ref 39.0–52.0)
HCT: 30 % — ABNORMAL LOW (ref 39.0–52.0)
HCT: 27 % — ABNORMAL LOW (ref 39.0–52.0)
HCT: 23 % — ABNORMAL LOW (ref 39.0–52.0)
HCT: 35 % — ABNORMAL LOW (ref 39.0–52.0)
Hemoglobin: 11.2 g/dL — ABNORMAL LOW (ref 13.0–17.0)
Hemoglobin: 10.2 g/dL — ABNORMAL LOW (ref 13.0–17.0)
Hemoglobin: 8.8 g/dL — ABNORMAL LOW (ref 13.0–17.0)
Hemoglobin: 9.2 g/dL — ABNORMAL LOW (ref 13.0–17.0)
Hemoglobin: 7.8 g/dL — ABNORMAL LOW (ref 13.0–17.0)
Hemoglobin: 11.9 g/dL — ABNORMAL LOW (ref 13.0–17.0)
Potassium: 3.4 mEq/L — ABNORMAL LOW (ref 3.7–5.3)
Potassium: 2.9 mEq/L — CL (ref 3.7–5.3)
Potassium: 3.2 mEq/L — ABNORMAL LOW (ref 3.7–5.3)
Potassium: 4.1 mEq/L (ref 3.7–5.3)
Potassium: 3.9 mEq/L (ref 3.7–5.3)
Potassium: 3.5 mEq/L — ABNORMAL LOW (ref 3.7–5.3)
Sodium: 140 mEq/L (ref 137–147)
Sodium: 137 mEq/L (ref 137–147)
Sodium: 142 mEq/L (ref 137–147)
Sodium: 136 mEq/L — ABNORMAL LOW (ref 137–147)
Sodium: 137 mEq/L (ref 137–147)
Sodium: 142 mEq/L (ref 137–147)

## 2013-09-27 LAB — GLUCOSE, CAPILLARY
Glucose-Capillary: 107 mg/dL — ABNORMAL HIGH (ref 70–99)
Glucose-Capillary: 125 mg/dL — ABNORMAL HIGH (ref 70–99)
Glucose-Capillary: 131 mg/dL — ABNORMAL HIGH (ref 70–99)
Glucose-Capillary: 99 mg/dL (ref 70–99)

## 2013-09-27 LAB — CBC
HCT: 35.4 % — ABNORMAL LOW (ref 39.0–52.0)
HCT: 35.2 % — ABNORMAL LOW (ref 39.0–52.0)
Hemoglobin: 12.3 g/dL — ABNORMAL LOW (ref 13.0–17.0)
Hemoglobin: 12 g/dL — ABNORMAL LOW (ref 13.0–17.0)
MCH: 30.4 pg (ref 26.0–34.0)
MCH: 30.1 pg (ref 26.0–34.0)
MCHC: 34.7 g/dL (ref 30.0–36.0)
MCHC: 34.1 g/dL (ref 30.0–36.0)
MCV: 87.6 fL (ref 78.0–100.0)
MCV: 88.2 fL (ref 78.0–100.0)
Platelets: 99 10*3/uL — ABNORMAL LOW (ref 150–400)
Platelets: 100 10*3/uL — ABNORMAL LOW (ref 150–400)
RBC: 4.04 MIL/uL — ABNORMAL LOW (ref 4.22–5.81)
RBC: 3.99 MIL/uL — ABNORMAL LOW (ref 4.22–5.81)
RDW: 14.8 % (ref 11.5–15.5)
RDW: 15 % (ref 11.5–15.5)
WBC: 13.2 10*3/uL — ABNORMAL HIGH (ref 4.0–10.5)
WBC: 13.6 10*3/uL — ABNORMAL HIGH (ref 4.0–10.5)

## 2013-09-27 LAB — POCT I-STAT 3, ART BLOOD GAS (G3+)
Acid-base deficit: 1 mmol/L (ref 0.0–2.0)
Acid-base deficit: 5 mmol/L — ABNORMAL HIGH (ref 0.0–2.0)
Acid-base deficit: 4 mmol/L — ABNORMAL HIGH (ref 0.0–2.0)
Bicarbonate: 24.5 mEq/L — ABNORMAL HIGH (ref 20.0–24.0)
Bicarbonate: 22.2 mEq/L (ref 20.0–24.0)
Bicarbonate: 21.8 mEq/L (ref 20.0–24.0)
O2 Saturation: 100 %
O2 Saturation: 91 %
O2 Saturation: 94 %
Patient temperature: 35.4
Patient temperature: 37.4
TCO2: 26 mmol/L (ref 0–100)
TCO2: 24 mmol/L (ref 0–100)
TCO2: 23 mmol/L (ref 0–100)
pCO2 arterial: 42.6 mmHg (ref 35.0–45.0)
pCO2 arterial: 44.8 mmHg (ref 35.0–45.0)
pCO2 arterial: 43.4 mmHg (ref 35.0–45.0)
pH, Arterial: 7.368 (ref 7.350–7.450)
pH, Arterial: 7.295 — ABNORMAL LOW (ref 7.350–7.450)
pH, Arterial: 7.31 — ABNORMAL LOW (ref 7.350–7.450)
pO2, Arterial: 247 mmHg — ABNORMAL HIGH (ref 80.0–100.0)
pO2, Arterial: 62 mmHg — ABNORMAL LOW (ref 80.0–100.0)
pO2, Arterial: 79 mmHg — ABNORMAL LOW (ref 80.0–100.0)

## 2013-09-27 LAB — CREATININE, SERUM
Creatinine, Ser: 0.96 mg/dL (ref 0.50–1.35)
GFR calc Af Amer: >90 mL/min (ref >90)
GFR calc non Af Amer: >90 mL/min (ref >90)

## 2013-09-27 LAB — PREPARE PLATELET PHERESIS: Unit division: 0

## 2013-09-27 LAB — POCT I-STAT, CHEM 8
BUN: 14 mg/dL (ref 6–23)
Calcium, Ion: 1.17 mmol/L (ref 1.12–1.23)
Chloride: 106 mEq/L (ref 96–112)
Creatinine, Ser: 1 mg/dL (ref 0.50–1.35)
Glucose, Bld: 145 mg/dL — ABNORMAL HIGH (ref 70–99)
HCT: 36 % — ABNORMAL LOW (ref 39.0–52.0)
Hemoglobin: 12.2 g/dL — ABNORMAL LOW (ref 13.0–17.0)
Potassium: 4.7 mEq/L (ref 3.7–5.3)
Sodium: 141 mEq/L (ref 137–147)
TCO2: 21 mmol/L (ref 0–100)

## 2013-09-27 LAB — APTT: aPTT: 34 seconds (ref 24–37)

## 2013-09-27 LAB — POCT I-STAT GLUCOSE
Glucose, Bld: 120 mg/dL — ABNORMAL HIGH (ref 70–99)
Operator id: 3406

## 2013-09-27 LAB — HEMOGLOBIN AND HEMATOCRIT, BLOOD
HCT: 27.7 % — ABNORMAL LOW (ref 39.0–52.0)
Hemoglobin: 9.7 g/dL — ABNORMAL LOW (ref 13.0–17.0)

## 2013-09-27 LAB — PLATELET COUNT: Platelets: 124 10*3/uL — ABNORMAL LOW (ref 150–400)

## 2013-09-27 LAB — PROTIME-INR
INR: 1.86 — ABNORMAL HIGH (ref 0.00–1.49)
Prothrombin Time: 20.9 seconds — ABNORMAL HIGH (ref 11.6–15.2)

## 2013-09-27 LAB — MAGNESIUM: Magnesium: 2.7 mg/dL — ABNORMAL HIGH (ref 1.5–2.5)

## 2013-09-27 SURGERY — BENTALL PROCEDURE
Anesthesia: General | Site: Chest

## 2013-09-27 MED ORDER — INSULIN ASPART 100 UNIT/ML ~~LOC~~ SOLN
0.0000 [IU] | SUBCUTANEOUS | Status: DC
  Administered 2013-09-27 – 2013-09-28 (×5): 2 [IU] via SUBCUTANEOUS

## 2013-09-27 MED ORDER — PANTOPRAZOLE SODIUM 40 MG PO TBEC
40.0000 mg | DELAYED_RELEASE_TABLET | Freq: Every day | ORAL | Status: DC
  Administered 2013-09-29 – 2013-10-03 (×5): 40 mg via ORAL
  Filled 2013-09-27 (×5): qty 1

## 2013-09-27 MED ORDER — DOCUSATE SODIUM 100 MG PO CAPS
200.0000 mg | ORAL_CAPSULE | Freq: Every day | ORAL | Status: DC
  Administered 2013-09-28 – 2013-10-03 (×4): 200 mg via ORAL
  Filled 2013-09-27 (×6): qty 2

## 2013-09-27 MED ORDER — ACETAMINOPHEN 500 MG PO TABS
1000.0000 mg | ORAL_TABLET | Freq: Four times a day (QID) | ORAL | Status: AC
  Administered 2013-09-27 – 2013-10-02 (×18): 1000 mg via ORAL
  Filled 2013-09-27 (×25): qty 2

## 2013-09-27 MED ORDER — BISACODYL 10 MG RE SUPP: 10.0000 mg | Freq: Every day | RECTAL | Status: DC

## 2013-09-27 MED ORDER — MILRINONE IN DEXTROSE 20 MG/100ML IV SOLN
INTRAVENOUS | Status: DC | PRN
  Administered 2013-09-27: .3 ug/kg/min via INTRAVENOUS

## 2013-09-27 MED ORDER — SODIUM CHLORIDE 0.9 % IR SOLN
Status: DC | PRN
  Administered 2013-09-27: 1000 mL

## 2013-09-27 MED ORDER — PHENYLEPHRINE 40 MCG/ML (10ML) SYRINGE FOR IV PUSH (FOR BLOOD PRESSURE SUPPORT)
PREFILLED_SYRINGE | INTRAVENOUS | Status: AC
  Filled 2013-09-27: qty 10

## 2013-09-27 MED ORDER — PROTAMINE SULFATE 10 MG/ML IV SOLN
INTRAVENOUS | Status: DC | PRN
Start: 2013-09-27 — End: 2013-09-27
  Administered 2013-09-27 (×6): 30 mg via INTRAVENOUS
  Administered 2013-09-27: 10 mg via INTRAVENOUS
  Administered 2013-09-27 (×2): 30 mg via INTRAVENOUS

## 2013-09-27 MED ORDER — SODIUM CHLORIDE 0.45 % IV SOLN
INTRAVENOUS | Status: DC
Start: 2013-09-27 — End: 2013-09-28
  Administered 2013-09-27: 20 mL/h via INTRAVENOUS

## 2013-09-27 MED ORDER — METOPROLOL TARTRATE 25 MG/10 ML ORAL SUSPENSION
12.5000 mg | Freq: Two times a day (BID) | ORAL | Status: DC
  Filled 2013-09-27 (×3): qty 5

## 2013-09-27 MED ORDER — MORPHINE SULFATE 2 MG/ML IJ SOLN: 1.0000 mg | INTRAMUSCULAR | Status: AC | PRN

## 2013-09-27 MED ORDER — SODIUM CHLORIDE 0.9 % IV SOLN
250.0000 mL | INTRAVENOUS | Status: AC
  Administered 2013-09-27 – 2013-09-28 (×2): 250 mL via INTRAVENOUS

## 2013-09-27 MED ORDER — ROCURONIUM BROMIDE 50 MG/5ML IV SOLN
INTRAVENOUS | Status: AC
  Filled 2013-09-27: qty 3

## 2013-09-27 MED ORDER — FENTANYL CITRATE 0.05 MG/ML IJ SOLN
INTRAMUSCULAR | Status: AC
  Filled 2013-09-27: qty 5

## 2013-09-27 MED ORDER — SODIUM CHLORIDE 0.9 % IJ SOLN
3.0000 mL | Freq: Two times a day (BID) | INTRAMUSCULAR | Status: DC
  Administered 2013-09-28 (×2): 3 mL via INTRAVENOUS

## 2013-09-27 MED ORDER — ARTIFICIAL TEARS OP OINT
TOPICAL_OINTMENT | OPHTHALMIC | Status: AC
  Filled 2013-09-27: qty 3.5

## 2013-09-27 MED ORDER — PROPOFOL 10 MG/ML IV BOLUS
INTRAVENOUS | Status: DC | PRN
  Administered 2013-09-27: 50 mg via INTRAVENOUS

## 2013-09-27 MED ORDER — ACETAMINOPHEN 160 MG/5ML PO SOLN: 650.0000 mg | Freq: Once | ORAL | Status: AC

## 2013-09-27 MED ORDER — OXYCODONE HCL 5 MG PO TABS
5.0000 mg | ORAL_TABLET | ORAL | Status: DC | PRN
  Administered 2013-09-27 – 2013-09-28 (×2): 10 mg via ORAL
  Administered 2013-09-28 – 2013-09-30 (×12): 5 mg via ORAL
  Filled 2013-09-27 (×2): qty 2
  Filled 2013-09-27 (×3): qty 1
  Filled 2013-09-27: qty 2
  Filled 2013-09-27 (×8): qty 1

## 2013-09-27 MED ORDER — MILRINONE IN DEXTROSE 20 MG/100ML IV SOLN
0.1250 ug/kg/min | INTRAVENOUS | Status: DC
  Filled 2013-09-27: qty 100

## 2013-09-27 MED ORDER — ROCURONIUM BROMIDE 100 MG/10ML IV SOLN
INTRAVENOUS | Status: DC | PRN
  Administered 2013-09-27: 50 mg via INTRAVENOUS
  Administered 2013-09-27: 20 mg via INTRAVENOUS
  Administered 2013-09-27: 50 mg via INTRAVENOUS
  Administered 2013-09-27: 30 mg via INTRAVENOUS

## 2013-09-27 MED ORDER — DEXTROSE 5 % IV SOLN
1.5000 g | Freq: Two times a day (BID) | INTRAVENOUS | Status: AC
  Administered 2013-09-27 – 2013-09-29 (×4): 1.5 g via INTRAVENOUS
  Filled 2013-09-27 (×4): qty 1.5

## 2013-09-27 MED ORDER — POTASSIUM CHLORIDE 10 MEQ/50ML IV SOLN
10.0000 meq | INTRAVENOUS | Status: AC
  Administered 2013-09-27 (×3): 10 meq via INTRAVENOUS

## 2013-09-27 MED ORDER — METOPROLOL TARTRATE 12.5 MG HALF TABLET
12.5000 mg | ORAL_TABLET | Freq: Two times a day (BID) | ORAL | Status: DC
  Filled 2013-09-27 (×3): qty 1

## 2013-09-27 MED ORDER — BISACODYL 5 MG PO TBEC
10.0000 mg | DELAYED_RELEASE_TABLET | Freq: Every day | ORAL | Status: DC
  Administered 2013-09-28 – 2013-10-03 (×4): 10 mg via ORAL
  Filled 2013-09-27 (×4): qty 2

## 2013-09-27 MED ORDER — ASPIRIN 81 MG PO CHEW: 324.0000 mg | CHEWABLE_TABLET | Freq: Every day | ORAL | Status: DC

## 2013-09-27 MED ORDER — MORPHINE SULFATE 2 MG/ML IJ SOLN
2.0000 mg | INTRAMUSCULAR | Status: DC | PRN
  Administered 2013-09-27: 2 mg via INTRAVENOUS
  Administered 2013-09-27 – 2013-09-28 (×2): 4 mg via INTRAVENOUS
  Administered 2013-09-28: 2 mg via INTRAVENOUS
  Filled 2013-09-27: qty 1
  Filled 2013-09-27 (×2): qty 2
  Filled 2013-09-27: qty 1

## 2013-09-27 MED ORDER — LACTATED RINGERS IV SOLN
INTRAVENOUS | Status: DC
  Administered 2013-09-27: 15:00:00 via INTRAVENOUS

## 2013-09-27 MED ORDER — ACETAMINOPHEN 160 MG/5ML PO SOLN: 1000.0000 mg | Freq: Four times a day (QID) | ORAL | Status: DC

## 2013-09-27 MED ORDER — MILRINONE IN DEXTROSE 20 MG/100ML IV SOLN
0.0000 ug/kg/min | INTRAVENOUS | Status: DC
  Administered 2013-09-27: 0.3 ug/kg/min via INTRAVENOUS
  Filled 2013-09-27: qty 100

## 2013-09-27 MED ORDER — MIDAZOLAM HCL 2 MG/2ML IJ SOLN: 2.0000 mg | INTRAMUSCULAR | Status: DC | PRN

## 2013-09-27 MED ORDER — ASPIRIN EC 325 MG PO TBEC
325.0000 mg | DELAYED_RELEASE_TABLET | Freq: Every day | ORAL | Status: DC
  Administered 2013-09-28: 325 mg via ORAL
  Filled 2013-09-27 (×2): qty 1

## 2013-09-27 MED ORDER — SODIUM CHLORIDE 0.9 % IV SOLN
INTRAVENOUS | Status: DC
  Filled 2013-09-27: qty 1

## 2013-09-27 MED ORDER — ONDANSETRON HCL 4 MG/2ML IJ SOLN
4.0000 mg | Freq: Four times a day (QID) | INTRAMUSCULAR | Status: DC | PRN
  Administered 2013-09-28 – 2013-09-29 (×3): 4 mg via INTRAVENOUS
  Filled 2013-09-27 (×3): qty 2

## 2013-09-27 MED ORDER — LIDOCAINE HCL (CARDIAC) 20 MG/ML IV SOLN
INTRAVENOUS | Status: AC
  Filled 2013-09-27: qty 5

## 2013-09-27 MED ORDER — ARTIFICIAL TEARS OP OINT
TOPICAL_OINTMENT | OPHTHALMIC | Status: DC | PRN
  Administered 2013-09-27: 1 via OPHTHALMIC

## 2013-09-27 MED ORDER — ACETAMINOPHEN 650 MG RE SUPP
650.0000 mg | Freq: Once | RECTAL | Status: AC
  Administered 2013-09-27: 650 mg via RECTAL

## 2013-09-27 MED ORDER — HEPARIN SODIUM (PORCINE) 1000 UNIT/ML IJ SOLN
INTRAMUSCULAR | Status: AC
  Filled 2013-09-27: qty 1

## 2013-09-27 MED ORDER — LACTATED RINGERS IV SOLN: 500.0000 mL | Freq: Once | INTRAVENOUS | Status: AC | PRN

## 2013-09-27 MED ORDER — HEPARIN SODIUM (PORCINE) 1000 UNIT/ML IJ SOLN
INTRAMUSCULAR | Status: DC | PRN
  Administered 2013-09-27: 25000 [IU] via INTRAVENOUS

## 2013-09-27 MED ORDER — METOPROLOL TARTRATE 1 MG/ML IV SOLN: 2.5000 mg | INTRAVENOUS | Status: DC | PRN

## 2013-09-27 MED ORDER — INSULIN REGULAR BOLUS VIA INFUSION
0.0000 [IU] | Freq: Three times a day (TID) | INTRAVENOUS | Status: DC
  Filled 2013-09-27: qty 10

## 2013-09-27 MED ORDER — PROTAMINE SULFATE 10 MG/ML IV SOLN
INTRAVENOUS | Status: AC
  Filled 2013-09-27: qty 25

## 2013-09-27 MED ORDER — SODIUM CHLORIDE 0.9 % IV SOLN
5.0000 g | INTRAVENOUS | Status: DC
  Filled 2013-09-27: qty 20

## 2013-09-27 MED ORDER — DOPAMINE-DEXTROSE 3.2-5 MG/ML-% IV SOLN
0.0000 ug/kg/min | INTRAVENOUS | Status: DC
Start: 2013-09-27 — End: 2013-09-29

## 2013-09-27 MED ORDER — FAMOTIDINE IN NACL 20-0.9 MG/50ML-% IV SOLN
20.0000 mg | Freq: Two times a day (BID) | INTRAVENOUS | Status: AC
  Administered 2013-09-27: 20 mg via INTRAVENOUS

## 2013-09-27 MED ORDER — FENTANYL CITRATE 0.05 MG/ML IJ SOLN
INTRAMUSCULAR | Status: DC | PRN
  Administered 2013-09-27: 500 ug via INTRAVENOUS
  Administered 2013-09-27: 250 ug via INTRAVENOUS
  Administered 2013-09-27: 100 ug via INTRAVENOUS
  Administered 2013-09-27: 50 ug via INTRAVENOUS
  Administered 2013-09-27: 100 ug via INTRAVENOUS
  Administered 2013-09-27: 250 ug via INTRAVENOUS

## 2013-09-27 MED ORDER — LIDOCAINE HCL (CARDIAC) 20 MG/ML IV SOLN: INTRAVENOUS | Status: DC | PRN

## 2013-09-27 MED ORDER — SODIUM CHLORIDE 0.9 % IV SOLN
INTRAVENOUS | Status: DC
  Administered 2013-09-27: 13:00:00 via INTRAVENOUS

## 2013-09-27 MED ORDER — ALBUMIN HUMAN 5 % IV SOLN
INTRAVENOUS | Status: DC | PRN
  Administered 2013-09-27: 13:00:00 via INTRAVENOUS

## 2013-09-27 MED ORDER — SODIUM CHLORIDE 0.9 % IJ SOLN
OROMUCOSAL | Status: DC | PRN
  Administered 2013-09-27 (×2): via TOPICAL

## 2013-09-27 MED ORDER — ALBUMIN HUMAN 5 % IV SOLN
250.0000 mL | INTRAVENOUS | Status: AC | PRN
  Administered 2013-09-27 (×2): 250 mL via INTRAVENOUS

## 2013-09-27 MED ORDER — NITROGLYCERIN IN D5W 200-5 MCG/ML-% IV SOLN: 0.0000 ug/min | INTRAVENOUS | Status: DC

## 2013-09-27 MED ORDER — HEMOSTATIC AGENTS (NO CHARGE) OPTIME
TOPICAL | Status: DC | PRN
  Administered 2013-09-27: 1 via TOPICAL

## 2013-09-27 MED ORDER — MIDAZOLAM HCL 10 MG/2ML IJ SOLN
INTRAMUSCULAR | Status: AC
  Filled 2013-09-27: qty 2

## 2013-09-27 MED ORDER — LACTATED RINGERS IV SOLN
INTRAVENOUS | Status: DC | PRN
  Administered 2013-09-27: 07:00:00 via INTRAVENOUS

## 2013-09-27 MED ORDER — VANCOMYCIN HCL IN DEXTROSE 1-5 GM/200ML-% IV SOLN
1000.0000 mg | Freq: Once | INTRAVENOUS | Status: AC
  Administered 2013-09-27: 1000 mg via INTRAVENOUS
  Filled 2013-09-27: qty 200

## 2013-09-27 MED ORDER — DEXTROSE 5 % IV SOLN
0.0000 ug/min | INTRAVENOUS | Status: DC
  Filled 2013-09-27 (×2): qty 2

## 2013-09-27 MED ORDER — MUPIROCIN 2 % EX OINT
TOPICAL_OINTMENT | CUTANEOUS | Status: AC
  Administered 2013-09-27: 1 via NASAL
  Filled 2013-09-27: qty 22

## 2013-09-27 MED ORDER — PROPOFOL 10 MG/ML IV BOLUS
INTRAVENOUS | Status: AC
  Filled 2013-09-27: qty 20

## 2013-09-27 MED ORDER — MAGNESIUM SULFATE 4000MG/100ML IJ SOLN
4.0000 g | Freq: Once | INTRAMUSCULAR | Status: AC
  Administered 2013-09-27: 4 g via INTRAVENOUS
  Filled 2013-09-27: qty 100

## 2013-09-27 MED ORDER — MIDAZOLAM HCL 5 MG/5ML IJ SOLN
INTRAMUSCULAR | Status: DC | PRN
  Administered 2013-09-27: 2 mg via INTRAVENOUS
  Administered 2013-09-27: 4 mg via INTRAVENOUS
  Administered 2013-09-27 (×3): 2 mg via INTRAVENOUS
  Administered 2013-09-27: 4 mg via INTRAVENOUS
  Administered 2013-09-27: 3 mg via INTRAVENOUS
  Administered 2013-09-27: 1 mg via INTRAVENOUS

## 2013-09-27 MED ORDER — DEXMEDETOMIDINE HCL IN NACL 200 MCG/50ML IV SOLN
0.1000 ug/kg/h | INTRAVENOUS | Status: DC
Start: 2013-09-27 — End: 2013-09-28

## 2013-09-27 MED ORDER — MUPIROCIN 2 % EX OINT
TOPICAL_OINTMENT | Freq: Two times a day (BID) | CUTANEOUS | Status: DC
  Administered 2013-09-27 – 2013-09-28 (×4): 1 via NASAL
  Administered 2013-09-29 (×2): via NASAL
  Administered 2013-09-30: 1 via NASAL
  Administered 2013-09-30 – 2013-10-03 (×6): via NASAL
  Filled 2013-09-27: qty 22

## 2013-09-27 MED ORDER — POTASSIUM CHLORIDE 10 MEQ/50ML IV SOLN
10.0000 meq | Freq: Once | INTRAVENOUS | Status: AC
  Administered 2013-09-27: 10 meq via INTRAVENOUS

## 2013-09-27 MED ORDER — SODIUM CHLORIDE 0.9 % IJ SOLN
3.0000 mL | INTRAMUSCULAR | Status: DC | PRN
  Administered 2013-09-28: 3 mL via INTRAVENOUS

## 2013-09-27 SURGICAL SUPPLY — 119 items
ADAPTER CARDIO PERF ANTE/RETRO (ADAPTER) ×3 IMPLANT
ANTEGRADE CPLG (MISCELLANEOUS) ×3 IMPLANT
APPLICATOR COTTON TIP 6IN STRL (MISCELLANEOUS) ×3 IMPLANT
APPLICATOR TIP BIOGLUE STANDRD (MISCELLANEOUS) IMPLANT
APPLICATOR TIP COSEAL (VASCULAR PRODUCTS) ×6 IMPLANT
ATRICLIP EXCLUSION 40 STD HAND (Clip) ×3 IMPLANT
ATTRACTOMAT 16X20 MAGNETIC DRP (DRAPES) ×3 IMPLANT
BAG DECANTER FOR FLEXI CONT (MISCELLANEOUS) ×6 IMPLANT
BLADE STERNUM SYSTEM 6 (BLADE) ×3 IMPLANT
BLADE SURG 11 STRL SS (BLADE) ×3 IMPLANT
CANISTER SUCTION 2500CC (MISCELLANEOUS) ×3 IMPLANT
CANN PRFSN 3/8X14X24FR PCFC (MISCELLANEOUS)
CANN PRFSN 3/8XCNCT ST RT ANG (MISCELLANEOUS)
CANNULA EZ GLIDE AORTIC 21FR (CANNULA) ×3 IMPLANT
CANNULA GUNDRY RCSP 15FR (MISCELLANEOUS) ×3 IMPLANT
CANNULA OPTISITE PERFUSION 18F (CANNULA) ×3 IMPLANT
CANNULA PRFSN 3/8X14X24FR PCFC (MISCELLANEOUS) IMPLANT
CANNULA PRFSN 3/8XCNCT RT ANG (MISCELLANEOUS) IMPLANT
CANNULA VEN MTL TIP RT (MISCELLANEOUS)
CANNULA VENNOUS METAL TIP 20FR (CANNULA) ×3 IMPLANT
CATH CPB KIT OWEN (MISCELLANEOUS) ×3 IMPLANT
CATH HEART VENT LEFT (CATHETERS) ×2 IMPLANT
CATH ROBINSON RED A/P 18FR (CATHETERS) IMPLANT
CATH THORACIC 28FR RT ANG (CATHETERS) IMPLANT
CATH THORACIC 36FR (CATHETERS) IMPLANT
CATH THORACIC 36FR RT ANG (CATHETERS) ×3 IMPLANT
CAUTERY EYE LOW TEMP 1300F FIN (OPHTHALMIC RELATED) ×3 IMPLANT
CLAMP ISOLATOR SYNERGY LG (MISCELLANEOUS) ×3 IMPLANT
CLIP FOGARTY SPRING 6M (CLIP) IMPLANT
CONN 1/2X1/2X1/2  BEN (MISCELLANEOUS) ×1
CONN 1/2X1/2X1/2 BEN (MISCELLANEOUS) ×2 IMPLANT
CONN 3/8X1/2 ST GISH (MISCELLANEOUS) ×6 IMPLANT
CONN ST 1/4X3/8  BEN (MISCELLANEOUS) ×1
CONN ST 1/4X3/8 BEN (MISCELLANEOUS) ×2 IMPLANT
CONT SPEC 4OZ CLIKSEAL STRL BL (MISCELLANEOUS) ×6 IMPLANT
COVER PROBE W GEL 5X96 (DRAPES) ×3 IMPLANT
COVER SURGICAL LIGHT HANDLE (MISCELLANEOUS) ×3 IMPLANT
CRADLE DONUT ADULT HEAD (MISCELLANEOUS) ×3 IMPLANT
DERMABOND ADVANCED (GAUZE/BANDAGES/DRESSINGS) ×1
DERMABOND ADVANCED .7 DNX12 (GAUZE/BANDAGES/DRESSINGS) ×2 IMPLANT
DRAIN CHANNEL 32F RND 10.7 FF (WOUND CARE) ×6 IMPLANT
DRAPE INCISE IOBAN 66X45 STRL (DRAPES) ×3 IMPLANT
DRAPE SLUSH/WARMER DISC (DRAPES) ×3 IMPLANT
DRSG COVADERM 4X14 (GAUZE/BANDAGES/DRESSINGS) ×3 IMPLANT
ELECT REM PT RETURN 9FT ADLT (ELECTROSURGICAL) ×6
ELECTRODE REM PT RTRN 9FT ADLT (ELECTROSURGICAL) ×4 IMPLANT
GLOVE BIO SURGEON STRL SZ 6 (GLOVE) ×9 IMPLANT
GLOVE BIO SURGEON STRL SZ 6.5 (GLOVE) ×12 IMPLANT
GLOVE BIO SURGEON STRL SZ7 (GLOVE) IMPLANT
GLOVE BIO SURGEON STRL SZ7.5 (GLOVE) IMPLANT
GLOVE ORTHO TXT STRL SZ7.5 (GLOVE) ×9 IMPLANT
GOWN STRL REUS W/ TWL LRG LVL3 (GOWN DISPOSABLE) ×16 IMPLANT
GOWN STRL REUS W/TWL LRG LVL3 (GOWN DISPOSABLE) ×8
HEMOSTAT POWDER SURGIFOAM 1G (HEMOSTASIS) ×9 IMPLANT
INSERT FOGARTY XLG (MISCELLANEOUS) ×3 IMPLANT
KIT BASIN OR (CUSTOM PROCEDURE TRAY) ×3 IMPLANT
KIT DILATOR VASC 18G NDL (KITS) ×3 IMPLANT
KIT DRAINAGE VACCUM ASSIST (KITS) ×3 IMPLANT
KIT ROOM TURNOVER OR (KITS) ×3 IMPLANT
KIT SUCTION CATH 14FR (SUCTIONS) ×12 IMPLANT
LEAD PACING MYOCARDI (MISCELLANEOUS) ×3 IMPLANT
LINE VENT (MISCELLANEOUS) ×3 IMPLANT
LOOP VESSEL SUPERMAXI WHITE (MISCELLANEOUS) ×6 IMPLANT
NS IRRIG 1000ML POUR BTL (IV SOLUTION) ×30 IMPLANT
PACK OPEN HEART (CUSTOM PROCEDURE TRAY) ×3 IMPLANT
PAD ARMBOARD 7.5X6 YLW CONV (MISCELLANEOUS) ×6 IMPLANT
PROBE CRYO2-ABLATION MALLABLE (MISCELLANEOUS) ×3 IMPLANT
SEALANT SURG COSEAL 8ML (VASCULAR PRODUCTS) ×3 IMPLANT
SET CANNULATION TOURNIQUET (MISCELLANEOUS) ×3 IMPLANT
SET CARDIOPLEGIA MPS 5001102 (MISCELLANEOUS) ×3 IMPLANT
SET IRRIG TUBING LAPAROSCOPIC (IRRIGATION / IRRIGATOR) ×3 IMPLANT
SOLUTION ANTI FOG 6CC (MISCELLANEOUS) ×3 IMPLANT
SPONGE GAUZE 4X4 12PLY (GAUZE/BANDAGES/DRESSINGS) ×6 IMPLANT
SPONGE GAUZE 4X4 12PLY STER LF (GAUZE/BANDAGES/DRESSINGS) ×3 IMPLANT
SUCKER INTRACARDIAC WEIGHTED (SUCKER) ×3 IMPLANT
SUT BONE WAX W31G (SUTURE) ×3 IMPLANT
SUT ETHIBON 2 0 V 52N 30 (SUTURE) ×9 IMPLANT
SUT ETHIBON EXCEL 2-0 V-5 (SUTURE) IMPLANT
SUT ETHIBOND 2 0 SH (SUTURE) ×2
SUT ETHIBOND 2 0 SH 36X2 (SUTURE) ×4 IMPLANT
SUT ETHIBOND 2 0 V4 (SUTURE) IMPLANT
SUT ETHIBOND 2 0V4 GREEN (SUTURE) IMPLANT
SUT ETHIBOND 4 0 RB 1 (SUTURE) IMPLANT
SUT ETHIBOND V-5 VALVE (SUTURE) IMPLANT
SUT ETHIBOND X763 2 0 SH 1 (SUTURE) ×15 IMPLANT
SUT GORETEX CV4 TH-18 (SUTURE) ×6 IMPLANT
SUT MNCRL AB 3-0 PS2 18 (SUTURE) ×9 IMPLANT
SUT PDS AB 1 CTX 36 (SUTURE) ×6 IMPLANT
SUT PROLENE 3 0 SH 1 (SUTURE) ×3 IMPLANT
SUT PROLENE 3 0 SH DA (SUTURE) ×9 IMPLANT
SUT PROLENE 3 0 SH1 36 (SUTURE) ×18 IMPLANT
SUT PROLENE 4 0 RB 1 (SUTURE) ×13
SUT PROLENE 4 0 SH DA (SUTURE) ×6 IMPLANT
SUT PROLENE 4-0 RB1 .5 CRCL 36 (SUTURE) ×26 IMPLANT
SUT PROLENE 5 0 C 1 36 (SUTURE) ×9 IMPLANT
SUT PROLENE 6 0 C 1 30 (SUTURE) IMPLANT
SUT SILK  1 MH (SUTURE) ×3
SUT SILK 1 MH (SUTURE) ×6 IMPLANT
SUT SILK 2 0 SH CR/8 (SUTURE) IMPLANT
SUT SILK 3 0 SH CR/8 (SUTURE) IMPLANT
SUT STEEL 6MS V (SUTURE) IMPLANT
SUT STEEL STERNAL CCS#1 18IN (SUTURE) ×3 IMPLANT
SUT STEEL SZ 6 DBL 3X14 BALL (SUTURE) ×6 IMPLANT
SUT VIC AB 2-0 CTX 27 (SUTURE) ×3 IMPLANT
SUT VIC AB 3-0 SH 8-18 (SUTURE) ×6 IMPLANT
SUTURE E-PAK OPEN HEART (SUTURE) ×3 IMPLANT
SYR 10ML KIT SKIN ADHESIVE (MISCELLANEOUS) IMPLANT
SYS ATRICLIP LAA EXCLUSION 45 (CLIP) IMPLANT
SYSTEM SAHARA CHEST DRAIN ATS (WOUND CARE) ×3 IMPLANT
TAPE CLOTH SURG 4X10 WHT LF (GAUZE/BANDAGES/DRESSINGS) ×3 IMPLANT
TOWEL OR 17X24 6PK STRL BLUE (TOWEL DISPOSABLE) ×6 IMPLANT
TOWEL OR 17X26 10 PK STRL BLUE (TOWEL DISPOSABLE) ×6 IMPLANT
TRAY FOLEY IC TEMP SENS 14FR (CATHETERS) IMPLANT
TRAY FOLEY IC TEMP SENS 16FR (CATHETERS) ×3 IMPLANT
TUBE SUCT INTRACARD DLP 20F (MISCELLANEOUS) ×3 IMPLANT
UNDERPAD 30X30 INCONTINENT (UNDERPADS AND DIAPERS) ×3 IMPLANT
VALVE AORTIC 29MM (Prosthesis & Implant Heart) ×3 IMPLANT
VENT LEFT HEART 12002 (CATHETERS) ×3
WATER STERILE IRR 1000ML POUR (IV SOLUTION) ×6 IMPLANT

## 2013-09-27 NOTE — Progress Notes (Signed)
Patient ready to wean. Tolerating well. RN at bedside. RT will monitor.

## 2013-09-27 NOTE — Transfer of Care (Signed)
Immediate Anesthesia Transfer of Care Note  Patient: [name] Hinton  Procedure(s) Performed: Procedure(s): BENTALL PROCEDURE (N/A) ASCENDING AORTIC ROOT REPLACEMENT (N/A) MAZE (N/A) INTRAOPERATIVE TRANSESOPHAGEAL ECHOCARDIOGRAM (N/A)  Patient Location: SICU  Anesthesia Type:General  Level of Consciousness: sedated and Patient remains intubated per anesthesia plan  Airway & Oxygen Therapy: Patient remains intubated per anesthesia plan and Patient placed on Ventilator (see vital sign flow sheet for setting)  Post-op Assessment: Report given to PACU RN and Post -op Vital signs reviewed and stable  Post vital signs: Reviewed and stable  Complications: No apparent anesthesia complications

## 2013-09-27 NOTE — Brief Op Note (Addendum)
09/27/2013  12:39 PM  PATIENT:  [name] Hinton  43 y.o. male  PRE-OPERATIVE DIAGNOSIS:  1. Bicuspid Aortic Valve 2. Severe Aortic Insufficiency 3. Paroxysmal atrial fibrillation and atrial flutter 4. Aortic Root Aneurysm  POST-OPERATIVE DIAGNOSIS:   1. Bicuspid Aortic Valve 2. Severe Aortic Insufficiency 3. Paroxysmal atrial fibrillation and atrial flutter   4. Aortic Root Aneurysm  PROCEDURE:  INTRAOPERATIVE TRANSESOPHAGEAL ECHO CARDIOGRAM, BENTALL PROCEDURE and ASCENDING AORTIC ROOT REPLACEMENT (using a Carbo medics Carbo-Seal Valsalva, valve size 29 mm) BILATERAL BIPOLAR AND COX CRYO MAZE, and LA CLIP  SURGEON:    [name], MD  ASSISTANTS:  Nani Skillern, PA-C  ANESTHESIA:   Arabella Merles, MD  CROSSCLAMP TIME:   140'  CARDIOPULMONARY BYPASS TIME: 184'  FINDINGS:  Congenitally bicuspid aortic valve with severe aortic insufficiency  Aneurysm of the aortic root and ascending thoracic aorta  Severe LV chamber enlargement  Moderate-severe global LV systolic dysfunction  Aortic Valve Etiology   Aortic Insufficiency:  Severe  Aortic Valve Disease:  yes.  Aortic Stenosis:  No.  Etiology (Choose at least one and up to  5 etiologies):  Bicuspid valve disease  Aortic Valve  Procedure Performed:  Replacement: Yes.  Mechanical Valve. Implant Model Number:CP-029, Size:29 mm, Unique Device Identifier:[id number]  Repair/Reconstruction: Yes.  Root replacement with valved conduit (Bentall) Mechanical Valve. Implant Model Number:CP-029, Size:29 mm, Unique Device Identifier:[id number]  Aortic Annular Enlargement: No.  Maze Procedure  Radiofrequency:  Yes.  Bipolar: Yes.  Cut-and-sew:  No.  Cryo: Yes  Lesions (select all that apply):    1   Pulmonary Vein Isolation,    2   Box Lesion,   3a  Inferior Pulmonary Vein Connecting Lesion,   3b  Superior Pulmonary Vein Connecting Lesion,     4  Posterior Mirtal Annular Line,     6  Mitral Valve Cryo Lesion,      7  LAA Ligation/Removal,     9   Intercaval Line to Tricuspid Annulus ("T" Lesion),    11  Intercaval Line,    13  Tricuspid Cryo Lesion,   15b  RAA Lateral Wall to "T" Lesion and    16  Other RA isthmus lesion  COMPLICATIONS: None  BASELINE WEIGHT: 75 kg  PATIENT DISPOSITION:   TO SICU IN STABLE CONDITION  [name] 09/27/2013 2:01 PM

## 2013-09-27 NOTE — Procedures (Signed)
Extubation Procedure Note  Patient Details:   Name: NGHIA MCENTEE DOB: 1971-07-19 MRN: [id number]   Airway Documentation:     Evaluation  O2 sats: stable throughout Complications: No apparent complications Patient did tolerate procedure well. Bilateral Breath Sounds: Clear;Diminished   Yes  Pt extubated at 20:20 to 6L [place]. NIF -35 cmH20 VC 0.8 L/min.   Dulcy Fanny 09/27/2013, 8:21 PM

## 2013-09-27 NOTE — Interval H&P Note (Signed)
History and Physical Interval Note:  09/27/2013 7:00 AM  [name] Hinton  has presented today for surgery, with the diagnosis of AI AFIB AORTIC ROOT ANEURSYM  The various methods of treatment have been discussed with the patient and family. After consideration of risks, benefits and other options for treatment, the patient has consented to  Procedure(s): BENTALL PROCEDURE (N/A) ASCENDING AORTIC ROOT REPLACEMENT (N/A) MAZE (N/A) INTRAOPERATIVE TRANSESOPHAGEAL ECHOCARDIOGRAM (N/A) as a surgical intervention .  The patient's history has been reviewed, patient examined, no change in status, stable for surgery.  I have reviewed the patient's chart and labs.  Questions were answered to the patient's satisfaction.     Kiondra Caicedo H

## 2013-09-27 NOTE — Progress Notes (Signed)
Patient ID: [name] Hinton, male   DOB: May 20, 1971, 43 y.o.   MRN: [id number]   SICU Evening Rounds:   Hemodynamically stable  CI = 2.3  Has started to wake up on vent. Moves all extremities to command.   Urine output good  CT output low  CBC    Component Value Date/Time   WBC 13.2* 09/27/2013 1435   RBC 4.04* 09/27/2013 1435   HGB 11.9* 09/27/2013 1438   HCT 35.0* 09/27/2013 1438   PLT 99* 09/27/2013 1435   MCV 87.6 09/27/2013 1435   MCH 30.4 09/27/2013 1435   MCHC 34.7 09/27/2013 1435   RDW 14.8 09/27/2013 1435   LYMPHSABS 1.4 09/01/2013 1251   MONOABS 0.6 09/01/2013 1251   EOSABS 0.2 09/01/2013 1251   BASOSABS 0.0 09/01/2013 1251     BMET    Component Value Date/Time   NA 142 09/27/2013 1438   K 3.5* 09/27/2013 1438   CL 103 09/26/2013 0853   CO2 24 09/26/2013 0853   GLUCOSE 93 09/27/2013 1438   BUN 26* 09/26/2013 0853   CREATININE 1.08 09/26/2013 0853   CALCIUM 9.4 09/26/2013 0853   GFRNONAA 84* 09/26/2013 0853   GFRAA >90 09/26/2013 0853     A/P:  Stable postop course. Continue current plans. Extubate when ready.

## 2013-09-27 NOTE — Anesthesia Postprocedure Evaluation (Signed)
  Anesthesia Post-op Note  Patient: [name] Hinton  Procedure(s) Performed: Procedure(s): BENTALL PROCEDURE (N/A) ASCENDING AORTIC ROOT REPLACEMENT (N/A) MAZE (N/A) INTRAOPERATIVE TRANSESOPHAGEAL ECHOCARDIOGRAM (N/A)  Patient Location: ICU  Anesthesia Type:General  Level of Consciousness: sedated and unresponsive  Airway and Oxygen Therapy: Patient remains intubated and on ventilator  Post-op Pain: none  Post-op Assessment: Post-op Vital signs reviewed, Patient's Cardiovascular Status Stable and Respiratory Function Stable  Post-op Vital Signs: Reviewed  Filed Vitals:   09/27/13 0546  BP: 139/51  Pulse: 63  Temp: 37 C  Resp: 20    Complications: No apparent anesthesia complications

## 2013-09-27 NOTE — Op Note (Signed)
CARDIOTHORACIC SURGERY OPERATIVE NOTE  Date of Procedure:  09/27/2013  Preoperative Diagnosis:   Bicuspid Aortic Valve  Severe Aortic Insufficiency  Aortic Root Aneurysm  Ascending Thoracic Aortic Aneurysm  Persistent Atrial Fibrillation and Atrial Flutter   Postoperative Diagnosis: Same   Procedure:    Bentall Aortic Root Replacement  Sorin Carbomedics Carbo-seal Mechanical Valve Conduit (valve size 29 mm, graft size 32 mm, catalog # CP-029, serial # [id number])   Maze Procedure  Complete bilateral atrial lesion set using cryothermy and bipolar radiofrequency ablation  Obliteration of Left Atrial Appendage (Atricure LA Clip, size 40 mm)    Surgeon: Valentina Gu. Roxy Manns, MD  Assistant: Nani Skillern, PA-C  Anesthesia: Arabella Merles, MD  Operative Findings: Congenitally bicuspid aortic valve with severe aortic insufficiency  Aneurysm of the aortic root and ascending thoracic aorta  Severe LV chamber enlargement  Moderate-severe global LV systolic dysfunction                            BRIEF CLINICAL NOTE AND INDICATIONS FOR SURGERY  Patient is a 43 year old single white male with no significant past medical history was admitted to the hospital 07/28/2013 with acute exacerbation of likely chronic systolic congestive heart failure with rapid persistent atrial flutter. Transthoracic echocardiogram revealed bicuspid aortic valve with severe aortic insufficiency and dilated left ventricle with severe left ventricular systolic dysfunction with ejection fraction 25%. 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 The patient's congestive heart failure improved with rate control and diuresis, and he underwent DC cardioversion, successfully  converting him to sinus rhythm. Transesophageal echocardiogram performed at the time of cardioversion confirmed the presence of bicuspid aortic valve with severe aortic insufficiency. Cardiothoracic surgical consultation was requested. He subsequently underwent left and right heart catheterization by Dr. Aundra Dubin on 09/05/2013. He was found to have no significant coronary artery disease but he did have elevated pulmonary catheter wedge pressure and left ventricular end-diastolic pressure with moderate pulmonary hypertension.  The patient has been seen in consultation and counseled at length regarding the indications, risks and potential benefits of surgery.  All questions have been answered, and the patient provides full informed consent for the operation as described.     DETAILS OF THE OPERATIVE PROCEDURE  Preparation:  The patient is brought to the operating room on the above mentioned date and central monitoring was established by the anesthesia team including placement of Swan-Ganz catheter and radial arterial line. The patient is placed in the supine position on the operating table.  Intravenous antibiotics are administered. General endotracheal anesthesia is induced uneventfully. A Foley catheter is placed.  Baseline transesophageal echocardiogram was performed.  Findings were notable for bicuspid native aortic valve with severe (4+) aortic insufficiency. The left ventricle was severely dilated. There was moderate to severe global left ventricular systolic dysfunction. There was mild mitral regurgitation. The aortic valve and aortic root were notably severely enlarged, approaching 4 cm in diameter. The proximal aorta was greater than 5 cm in diameter.  The patient's chest, abdomen, both groins, and both lower extremities are prepared and draped in a sterile manner. A time out procedure is performed.   Surgical Approach:  A median sternotomy incision was performed and the pericardium is opened.  The ascending aorta severely enlarged and somewhat thin walled, although  it tapers down to near normal diameter at the level of the innominate artery.   Extracorporeal Cardiopulmonary Bypass and Myocardial Protection:  A small incision is made in the right inguinal crease and the anterior surface of the right common femoral artery and right common femoral vein are identified. Pursestring sutures are placed on the anterior surface of the right common femoral vein and right common femoral artery. The right common femoral vein is cannulated with the Seldinger technique and a guidewire is advanced under transesophageal echocardiogram guidance through the right atrium. The femoral vein is cannulated with a long 22 French femoral venous cannula. The right common femoral artery is cannulated with Seldinger technique and a flexible guidewire is advanced until it can be appreciated intraluminally in the descending thoracic aorta on transesophageal echocardiogram. The femoral artery is cannulated with an 18 French femoral arterial cannula.  Adequate heparinization is verified.      The entire pre-bypass portion of the operation was notable for stable hemodynamics.  Cardiopulmonary bypass was begun.  Vacuum assist venous drainage is utilized. A second venous cannula is placed directly into the superior vena cava.  A retrograde cardioplegia cannula is placed through the right atrium into the coronary sinus.  The patient is cooled to 32C systemic temperature.  The aortic cross clamp is applied and cold blood cardioplegia is delivered retrograde through the coronary sinus catheter. The initial cardioplegic arrest is rapid with early diastolic arrest.  Repeat doses of cardioplegia are administered intermittently every 20 to 30 minutes throughout the entire cross clamp portion of the operation through the coronary sinus catheter in order to maintain completely flat electrocardiogram.  Myocardial protection was felt to be  excellent.   Maze Procedure (left atrial lesion set):  The AtriCure Synergy bipolar radiofrequency ablation clamp is used for all radiofrequency ablation lesions for the maze procedure.  The Atricure CryoICE nitrous oxide cryothermy system is utilized for all cryothermy ablation lesions.   The heart is retracted towards the surgeon's side and the left sided pulmonary veins exposed.  An elliptical ablation lesion is created around the base of the left sided pulmonary veins.  A similar elliptical lesion was created around the base of the left atrial appendage.  The left atrial appendage was obliterated using an Atricure left atrial appendage clip (Atriclip, size 61mm).  The heart was replaced into the pericardial sac.  A left atriotomy incision was performed through the interatrial groove and extended partially across the back wall of the left atrium after opening the oblique sinus inferiorly.  The floor of the left atrium and the mitral valve were exposed using a self-retaining retractor.    An ablation lesion was placed around the right sided pulmonary veins using the bipolar clamp with one limb of the clamp along the endocardial surface and one along the epicardial surface posteriorly.  A bipolar ablation lesion was placed across the dome of the left atrium from the cephalad apex of the atriotomy incision to reach the cephalad apex of the elliptical lesion around the left sided pulmonary veins.  A similar bipolar lesion was placed across the back wall of the left atrium from the caudad apex of the atriotomy incision to reach the caudad apex of the elliptical lesion around the left sided pulmonary veins, thereby completing a box.  Finally another bipolar lesion was placed across the back wall of the left atrium from the caudad apex of the atriotomy incision towards the posterior mitral valve annulus.  This lesion was completed along  the endocardial surface onto the posterior mitral annulus with a 3  minute duration cryothermy lesion, followed by a second cryothermy lesion along the posterior epicardial surface of the left atrium to the coronary sinus.  This completes the entire left side lesion set of the Cox maze procedure.  Left atriotomy was closed using a 2 layer closure of running 3-0 Prolene suture after placing a sump drain across the mitral valve to serve as a left ventricular vent.   Aortic Root Replacement:  The ascending thoracic aorta is transected 2 cm below the aortic cross-clamp where the aorta is near normal in diameter. The ascending thoracic aorta aneurysm is resected to the level of the sinotubular junction which is notably severely dilated. The aortic root is examined. There is severe enlargement of the entire aortic root. There is a congenitally bicuspid aortic valve which is obviously completely incompetent. There is no aortic stenosis. Aortic valve leaflets are resected.  The left main and the right coronary arteries are each mobilized on separate coronary buttons and the sinuses of Valsalva are removed. Aortic root is excised to accept a 29 mm mechanical prosthesis which notably fits quite easily in this severely dilated root.  Bentall aortic root replacement is performed using a Sorin Carbomedics Carbo-seal mechanical valve conduit (valve size 29 mm, graft size 32 mm, catalog # [id number], serial # [id number]).  The proximal suture line is performed using interrupted 2-0 Ethibond horizontal mattress pledgeted sutures with pledgets in the supraannular position.  The left main and the right coronary arteries are each reimplanted into the Valsalva portion of the conduit graft using running 5-0 Prolene suture after cutting appropriately sized circular holes in the graft using thermal cautery.  The distal end of the aortic graft is trimmed and beveled to an appropriate length and sewn directly to the aorta just below the aortic cross-clamp using running 4-0 Prolene suture.  One final dose  of warm retrograde "hot shot" cardioplegia was administered retrograde through the coronary sinus catheter while all air was evacuated through the aortic root.  The aortic cross clamp was removed after a total cross clamp time of 140 minutes.   Maze Procedure (right atrial lesion set):  The retrograde cardioplegia cannula was removed and the small hole in the right atrium extended a short distance.  The AtriCure Synergy bipolar radiofrequency ablation clamp is utilized to create a series of linear lesions in the right atrium, each with one limb of the clamp along the endocardial surface and the other along the epicardial surface. The first lesion is placed from the posterior apex of the atriotomy incision and along the lateral wall of the right atrium to reach the lateral aspect of the superior vena cava. A second lesion is placed in the opposite direction from the posterior apex of the atriotomy incision along the lateral wall to reach the lateral aspect of the inferior vena cava. A third lesion is placed from the midportion of the atriotomy incision extending at a right angle to reach the tip of the right atrial appendage. A fourth lesion is placed from the anterior apex of the atriotomy incision in an anterior and inferior direction to reach the acute margin of the heart. Finally, the cryotherapy probe is utilized to complete the right atrial lesion set by placing the probe along the endocardial surface of the right atrium from the anterior apex of the atriotomy incision to reach the tricuspid annulus at the 2:00 position. One additional cryosurgery lesion is placed across the  isthmus excess of the patient's history of atrial flutter.  The right atriotomy incision is closed with a 2 layer closure of running 4-0 Prolene suture.   Procedure Completion:  Epicardial pacing wires are fixed to the right ventricular outflow tract and to the right atrial appendage. The patient is rewarmed to 37C temperature.  The left ventricular vent is removed.  The superior vena cava cannula was removed.  The patient is weaned and disconnected from cardiopulmonary bypass.  The patient's rhythm at separation from bypass was AV paced.  The patient was weaned from cardioplegic bypass on low dose milrinone and dopamine infusions.  Total cardiopulmonary bypass time for the operation was 184 minutes.  Followup transesophageal echocardiogram performed after separation from bypass revealed a well-seated mechanical aortic valve prosthesis that was functioning normally and without any sign of perivalvular leak.  Left ventricular function was severely reduced but without any wall motion abnormalities.  Left ventricular function gradually improved during the post-bypass portion of the procedure.  There was no mitral regurgitation.  The femoral arterial and venous cannulae were removed uneventfully. There was a palpable pulse in the distal right common femoral artery after removal of the cannula.  Protamine was administered to reverse the anticoagulation. The mediastinum and pleural space were inspected for hemostasis and irrigated with saline solution. The mediastinum and the right pleural space were drained using 3 chest tubes placed through separate stab incisions inferiorly.  The soft tissues anterior to the aorta were reapproximated loosely. The sternum is closed with double strength sternal wire. The soft tissues anterior to the sternum were closed in multiple layers and the skin is closed with a running subcuticular skin closure.  The right groin incision was inspected for hemostasis and closed in multiple layers in routine fashion.  The post-bypass portion of the operation was notable for stable rhythm and hemodynamics.  No blood products were administered during the operation.   Disposition:  The patient tolerated the procedure well and is transported to the surgical intensive care in stable condition. There are no  intraoperative complications. All sponge instrument and needle counts are verified correct at completion of the operation.    Valentina Gu. Roxy Manns MD 09/27/2013 2:05 PM

## 2013-09-27 NOTE — Progress Notes (Signed)
  Echocardiogram Echocardiogram Transesophageal has been performed.  [name] Hinton 09/27/2013, 9:05 AM

## 2013-09-27 NOTE — OR Nursing (Signed)
SICU first call @ 1318

## 2013-09-27 NOTE — Progress Notes (Signed)
Utilization Review Completed.Donne Anon T3/04/2014

## 2013-09-27 NOTE — Progress Notes (Signed)
RT Note-Ventilator changes made post ABG.

## 2013-09-27 NOTE — Preoperative (Signed)
Beta Blockers   Reason not to administer Beta Blockers:Not Applicable 

## 2013-09-28 ENCOUNTER — Inpatient Hospital Stay (HOSPITAL_COMMUNITY): Payer: BC Managed Care – PPO

## 2013-09-28 LAB — POCT I-STAT 3, ART BLOOD GAS (G3+)
Acid-base deficit: 2 mmol/L (ref 0.0–2.0)
Bicarbonate: 23.4 mEq/L (ref 20.0–24.0)
O2 Saturation: 94 %
Patient temperature: 37.1
TCO2: 25 mmol/L (ref 0–100)
pCO2 arterial: 43 mmHg (ref 35.0–45.0)
pH, Arterial: 7.344 — ABNORMAL LOW (ref 7.350–7.450)
pO2, Arterial: 75 mmHg — ABNORMAL LOW (ref 80.0–100.0)

## 2013-09-28 LAB — BASIC METABOLIC PANEL
BUN: 13 mg/dL (ref 6–23)
CO2: 23 mEq/L (ref 19–32)
Calcium: 8.3 mg/dL — ABNORMAL LOW (ref 8.4–10.5)
Chloride: 106 mEq/L (ref 96–112)
Creatinine, Ser: 0.87 mg/dL (ref 0.50–1.35)
GFR calc Af Amer: >90 mL/min (ref >90)
GFR calc non Af Amer: >90 mL/min (ref >90)
Glucose, Bld: 136 mg/dL — ABNORMAL HIGH (ref 70–99)
Potassium: 4.5 mEq/L (ref 3.7–5.3)
Sodium: 139 mEq/L (ref 137–147)

## 2013-09-28 LAB — GLUCOSE, CAPILLARY
Glucose-Capillary: 118 mg/dL — ABNORMAL HIGH (ref 70–99)
Glucose-Capillary: 123 mg/dL — ABNORMAL HIGH (ref 70–99)
Glucose-Capillary: 126 mg/dL — ABNORMAL HIGH (ref 70–99)
Glucose-Capillary: 131 mg/dL — ABNORMAL HIGH (ref 70–99)
Glucose-Capillary: 133 mg/dL — ABNORMAL HIGH (ref 70–99)

## 2013-09-28 LAB — CBC
HCT: 31.2 % — ABNORMAL LOW (ref 39.0–52.0)
HCT: 31.3 % — ABNORMAL LOW (ref 39.0–52.0)
Hemoglobin: 10.6 g/dL — ABNORMAL LOW (ref 13.0–17.0)
Hemoglobin: 10.7 g/dL — ABNORMAL LOW (ref 13.0–17.0)
MCH: 30 pg (ref 26.0–34.0)
MCH: 30.3 pg (ref 26.0–34.0)
MCHC: 34 g/dL (ref 30.0–36.0)
MCHC: 34.2 g/dL (ref 30.0–36.0)
MCV: 88.4 fL (ref 78.0–100.0)
MCV: 88.7 fL (ref 78.0–100.0)
Platelets: 92 10*3/uL — ABNORMAL LOW (ref 150–400)
Platelets: 100 10*3/uL — ABNORMAL LOW (ref 150–400)
RBC: 3.53 MIL/uL — ABNORMAL LOW (ref 4.22–5.81)
RBC: 3.53 MIL/uL — ABNORMAL LOW (ref 4.22–5.81)
RDW: 15.2 % (ref 11.5–15.5)
RDW: 15.7 % — ABNORMAL HIGH (ref 11.5–15.5)
WBC: 12.4 10*3/uL — ABNORMAL HIGH (ref 4.0–10.5)
WBC: 14.6 10*3/uL — ABNORMAL HIGH (ref 4.0–10.5)

## 2013-09-28 LAB — CREATININE, SERUM
Creatinine, Ser: 0.75 mg/dL (ref 0.50–1.35)
GFR calc Af Amer: >90 mL/min (ref >90)
GFR calc non Af Amer: >90 mL/min (ref >90)

## 2013-09-28 LAB — POCT I-STAT, CHEM 8
BUN: 12 mg/dL (ref 6–23)
Calcium, Ion: 1.28 mmol/L — ABNORMAL HIGH (ref 1.12–1.23)
Chloride: 99 mEq/L (ref 96–112)
Creatinine, Ser: 0.9 mg/dL (ref 0.50–1.35)
Glucose, Bld: 139 mg/dL — ABNORMAL HIGH (ref 70–99)
HCT: 33 % — ABNORMAL LOW (ref 39.0–52.0)
Hemoglobin: 11.2 g/dL — ABNORMAL LOW (ref 13.0–17.0)
Potassium: 4.5 mEq/L (ref 3.7–5.3)
Sodium: 137 mEq/L (ref 137–147)
TCO2: 25 mmol/L (ref 0–100)

## 2013-09-28 LAB — MAGNESIUM
Magnesium: 2.2 mg/dL (ref 1.5–2.5)
Magnesium: 2.1 mg/dL (ref 1.5–2.5)

## 2013-09-28 MED ORDER — WARFARIN - PHYSICIAN DOSING INPATIENT
Freq: Every day | Status: DC
  Administered 2013-09-28 – 2013-09-29 (×2)

## 2013-09-28 MED ORDER — LACTATED RINGERS IV SOLN: INTRAVENOUS | Status: DC

## 2013-09-28 MED ORDER — INSULIN ASPART 100 UNIT/ML ~~LOC~~ SOLN
0.0000 [IU] | SUBCUTANEOUS | Status: DC
  Administered 2013-09-29: 2 [IU] via SUBCUTANEOUS

## 2013-09-28 MED ORDER — CHLORHEXIDINE GLUCONATE 0.12 % MT SOLN
5.0000 mL | Freq: Two times a day (BID) | OROMUCOSAL | Status: DC
  Administered 2013-09-28 – 2013-10-03 (×10): 5 mL via OROMUCOSAL
  Filled 2013-09-28 (×12): qty 15

## 2013-09-28 MED ORDER — WARFARIN SODIUM 2.5 MG PO TABS
2.5000 mg | ORAL_TABLET | Freq: Every day | ORAL | Status: DC
  Administered 2013-09-28 – 2013-09-29 (×2): 2.5 mg via ORAL
  Filled 2013-09-28 (×3): qty 1

## 2013-09-28 MED ORDER — MORPHINE SULFATE 2 MG/ML IJ SOLN
2.0000 mg | INTRAMUSCULAR | Status: DC | PRN
  Administered 2013-09-28: 2 mg via INTRAVENOUS
  Filled 2013-09-28: qty 1

## 2013-09-28 MED FILL — Sodium Bicarbonate IV Soln 8.4%: INTRAVENOUS | Qty: 50 | Status: AC

## 2013-09-28 MED FILL — Potassium Chloride Inj 2 mEq/ML: INTRAVENOUS | Qty: 40 | Status: AC

## 2013-09-28 MED FILL — Sodium Chloride IV Soln 0.9%: INTRAVENOUS | Qty: 1000 | Status: AC

## 2013-09-28 MED FILL — Mannitol IV Soln 20%: INTRAVENOUS | Qty: 500 | Status: AC

## 2013-09-28 MED FILL — Lidocaine HCl IV Inj 20 MG/ML: INTRAVENOUS | Qty: 5 | Status: AC

## 2013-09-28 MED FILL — Magnesium Sulfate Inj 50%: INTRAMUSCULAR | Qty: 10 | Status: AC

## 2013-09-28 MED FILL — Heparin Sodium (Porcine) Inj 1000 Unit/ML: INTRAMUSCULAR | Qty: 30 | Status: AC

## 2013-09-28 MED FILL — Electrolyte-R (PH 7.4) Solution: INTRAVENOUS | Qty: 4000 | Status: AC

## 2013-09-28 MED FILL — Heparin Sodium (Porcine) Inj 1000 Unit/ML: INTRAMUSCULAR | Qty: 10 | Status: AC

## 2013-09-28 NOTE — Progress Notes (Addendum)
T[facility] DAILY ICU PROGRESS NOTE                   [address]            [place] [address]          [phone]   1 Day Post-Op Procedure(s) (LRB): BENTALL PROCEDURE (N/A) ASCENDING AORTIC ROOT REPLACEMENT (N/A) MAZE (N/A) INTRAOPERATIVE TRANSESOPHAGEAL ECHOCARDIOGRAM (N/A)  Total Length of Stay:  LOS: 1 day   Subjective: Feels soreness, but overall doing well   Objective: Vital signs in last 24 hours: Temp:  [95.7 F (35.4 C)-99.3 F (37.4 C)] 97.8 F (36.6 C) (03/11 0835) Pulse Rate:  [88-90] 90 (03/11 0700) Cardiac Rhythm:  [-] Atrial paced (03/11 0700) Resp:  [0-39] 28 (03/11 0700) BP: (86-111)/(54-75) 100/59 mmHg (03/11 0700) SpO2:  [92 %-98 %] 95 % (03/11 0700) FiO2 (%):  [40 %-70 %] 40 % (03/10 1915) Weight:  [174 lb 13.2 oz (79.3 kg)] 174 lb 13.2 oz (79.3 kg) (03/11 0500)  Filed Weights   09/26/13 1300 09/28/13 0500  Weight: 165 lb (74.844 kg) 174 lb 13.2 oz (79.3 kg)    Weight change: 9 lb 13.2 oz (4.457 kg)   Hemodynamic parameters for last 24 hours: PAP: (31-57)/(17-32) 46/20 mmHg CO:  [2.5 L/min-6 L/min] 5.4 L/min CI:  [1.3 L/min/m2-3.2 L/min/m2] 2.9 L/min/m2  Intake/Output from previous day: 03/10 0701 - 03/11 0700 In: 7464.4 [I.V.:5259.4; Blood:955; IV Piggyback:1250] Out: 5505 [Urine:4605; Chest Tube:900]  Intake/Output this shift: Total I/O In: -  Out: 210 [Urine:110; Chest Tube:100]  Current Meds: Scheduled Meds: . acetaminophen  1,000 mg Oral 4 times per day   Or  . acetaminophen (TYLENOL) oral liquid 160 mg/5 mL  1,000 mg Per Tube 4 times per day  . aspirin EC  325 mg Oral Daily   Or  . aspirin  324 mg Per Tube Daily  . bisacodyl  10 mg Oral Daily   Or  . bisacodyl  10 mg Rectal Daily  . cefUROXime (ZINACEF)  IV  1.5 g Intravenous Q12H  . docusate sodium  200 mg Oral Daily  . famotidine (PEPCID) IV  20 mg Intravenous Q12H  . insulin aspart  0-24 Units Subcutaneous 6 times per day  . insulin regular  0-10 Units  Intravenous TID WC  . metoprolol tartrate  12.5 mg Oral BID   Or  . metoprolol tartrate  12.5 mg Per Tube BID  . mupirocin ointment   Nasal BID  . [START ON 09/29/2013] pantoprazole  40 mg Oral Daily  . sodium chloride  3 mL Intravenous Q12H   Continuous Infusions: . sodium chloride 20 mL/hr at 09/28/13 0700  . sodium chloride Stopped (09/27/13 1438)  . dexmedetomidine Stopped (09/27/13 1700)  . DOPamine 3 mcg/kg/min (09/28/13 0700)  . insulin (NOVOLIN-R) infusion Stopped (09/27/13 1700)  . lactated ringers 20 mL/hr at 09/28/13 0700  . milrinone 0.3 mcg/kg/min (09/28/13 0700)  . nitroGLYCERIN Stopped (09/27/13 1430)  . phenylephrine (NEO-SYNEPHRINE) Adult infusion 15 mcg/min (09/28/13 0700)   PRN Meds:.albumin human, metoprolol, midazolam, morphine injection, ondansetron (ZOFRAN) IV, oxyCODONE, sodium chloride  General appearance: alert, cooperative and no distress Neurologic: intact Heart: regular rate and rhythm Lungs: clear anteriorly Abdomen: soft, benign Extremities: + periph edema Wound: dressings CDI  Lab Results: CBC: Recent Labs  09/27/13 2030 09/27/13 2036 09/28/13 0410  WBC 13.6*  --  12.4*  HGB 12.0* 12.2* 10.6*  HCT 35.2* 36.0* 31.2*  PLT 100*  --  92*  BMET:  Recent Labs  09/26/13 0853  09/27/13 2036 09/28/13 0410  NA 142  < > 141 139  K 4.1  < > 4.7 4.5  CL 103  --  106 106  CO2 24  --   --  23  GLUCOSE 115*  < > 145* 136*  BUN 26*  --  14 13  CREATININE 1.08  < > 1.00 0.87  CALCIUM 9.4  --   --  8.3*  < > = values in this interval not displayed.  PT/INR:  Recent Labs  09/27/13 1435  LABPROT 20.9*  INR 1.86*   ABG    Component Value Date/Time   PHART 7.344* 09/28/2013 0424   PCO2ART 43.0 09/28/2013 0424   PO2ART 75.0* 09/28/2013 0424   HCO3 23.4 09/28/2013 0424   TCO2 25 09/28/2013 0424   ACIDBASEDEF 2.0 09/28/2013 0424   O2SAT 94.0 09/28/2013 0424      Radiology: Dg Chest 2 View  09/26/2013   CLINICAL DATA:  Preoperative Bentall  procedure ; cardiac a arrhythmia  EXAM: CHEST  2 VIEW  COMPARISON:  Chest radiograph and chest CT July 28, 2013  FINDINGS: Generalized cardiac enlargement is stable. No edema or consolidation. Heart size and pulmonary vascularity are normal. No adenopathy. No bone lesions.  IMPRESSION: Stable generalized cardiac enlargement.  No edema or consolidation.   Electronically Signed   By: Lowella Grip M.D.   On: 09/26/2013 09:23   Dg Chest Portable 1 View In Am  09/28/2013   CLINICAL DATA Postoperative aortic valve replacement  EXAM PORTABLE CHEST - 1 VIEW  COMPARISON September 27, 2013  FINDINGS Endotracheal tube and nasogastric tube have been removed. Swan-Ganz catheter tip is in the right main pulmonary artery. There is a mediastinal drain, unchanged in position. There is a catheter extending to the left atrium from below the diaphragm. Temporary pacemaker wires are attached to the right heart. There is no pneumothorax. There is consolidation in both medial lung bases, slightly increased bilaterally. Heart is enlarged with normal pulmonary vascularity. There is an aortic valve replacement present. There is also a clamp in the left atrial appendage region.  IMPRESSION Tube and catheter positions as described without pneumothorax. Stable cardiomegaly. Consolidation in both medial bases, slightly increased bilaterally.  SIGNATURE  Electronically Signed   By: Lowella Grip M.D.   On: 09/28/2013 07:41   Dg Chest Portable 1 View  09/27/2013   CLINICAL DATA Postop  EXAM PORTABLE CHEST - 1 VIEW  COMPARISON DG CHEST 2 VIEW dated 09/26/2013; CT ANGIO CHEST W/CM &/OR WO/CM dated 07/28/2013; DG CHEST 1V PORT dated 07/28/2013  FINDINGS Endotracheal tube tip is 4 cm above the carina. Swan-Ganz central line is seen with tip in the right main pulmonary artery. There is a right internal jugular central line adjacent to this with tip at the cavoatrial junction. NG tube crosses the gastroesophageal junction. There is a mediastinal  drain and a right chest tube.  There is no pneumothorax. Mild left lower lobe atelectasis is present. The right lung is clear. Right costophrenic angle is excluded from the image.  IMPRESSION Acceptable postoperative appearance.  SIGNATURE  Electronically Signed   By: Skipper Cliche M.D.   On: 09/27/2013 15:10     Assessment/Plan: S/P Procedure(s) (LRB): BENTALL PROCEDURE (N/A) ASCENDING AORTIC ROOT REPLACEMENT (N/A) MAZE (N/A) INTRAOPERATIVE TRANSESOPHAGEAL ECHOCARDIOGRAM (N/A)  1 doing well 2 expected ABL anemia- monitor. 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  Doing very well POD1.  Wean drips.  Mobilize.  Hold diuretics until BP stable off Neo drip.  Sinus brady - AAI paced.  Will hold metoprolol for now.  Start coumadin.  Kellan Boehlke H 09/28/2013 8:54 AM

## 2013-09-28 NOTE — Progress Notes (Signed)
POD # 1 Bentall, maze  Up in chair  BP 106/69  Pulse 79  Temp(Src) 98.1 F (36.7 C) (Oral)  Resp 41  Wt 174 lb 13.2 oz (79.3 kg)  SpO2 97%   Intake/Output Summary (Last 24 hours) at 09/28/13 1813 Last data filed at 09/28/13 1700  Gross per 24 hour  Intake 2755.6 ml  Output   2520 ml  Net  235.6 ml   K= 4.5 Creatinine= 0.9 Hct = 33  Doing well POD # 1

## 2013-09-28 NOTE — Plan of Care (Signed)
Problem: Phase I - Pre-Op Goal: Point person for discharge identified Outcome: Completed/Met Date Met:  09/28/13 Will be staying with his parents upon discharge

## 2013-09-29 ENCOUNTER — Encounter (HOSPITAL_COMMUNITY): Payer: Self-pay

## 2013-09-29 ENCOUNTER — Inpatient Hospital Stay (HOSPITAL_COMMUNITY): Payer: BC Managed Care – PPO

## 2013-09-29 ENCOUNTER — Encounter (HOSPITAL_COMMUNITY): Payer: Self-pay | Admitting: Thoracic Surgery (Cardiothoracic Vascular Surgery)

## 2013-09-29 LAB — PROTIME-INR
INR: 1.44 (ref 0.00–1.49)
Prothrombin Time: 17.2 seconds — ABNORMAL HIGH (ref 11.6–15.2)

## 2013-09-29 LAB — GLUCOSE, CAPILLARY
Glucose-Capillary: 134 mg/dL — ABNORMAL HIGH (ref 70–99)
Glucose-Capillary: 142 mg/dL — ABNORMAL HIGH (ref 70–99)

## 2013-09-29 LAB — CBC
HCT: 31.2 % — ABNORMAL LOW (ref 39.0–52.0)
Hemoglobin: 10.5 g/dL — ABNORMAL LOW (ref 13.0–17.0)
MCH: 30.1 pg (ref 26.0–34.0)
MCHC: 33.7 g/dL (ref 30.0–36.0)
MCV: 89.4 fL (ref 78.0–100.0)
Platelets: 95 10*3/uL — ABNORMAL LOW (ref 150–400)
RBC: 3.49 MIL/uL — ABNORMAL LOW (ref 4.22–5.81)
RDW: 15.6 % — ABNORMAL HIGH (ref 11.5–15.5)
WBC: 13.8 10*3/uL — ABNORMAL HIGH (ref 4.0–10.5)

## 2013-09-29 LAB — BASIC METABOLIC PANEL
BUN: 16 mg/dL (ref 6–23)
CO2: 24 mEq/L (ref 19–32)
Calcium: 8.7 mg/dL (ref 8.4–10.5)
Chloride: 96 mEq/L (ref 96–112)
Creatinine, Ser: 1 mg/dL (ref 0.50–1.35)
GFR calc Af Amer: >90 mL/min (ref >90)
GFR calc non Af Amer: >90 mL/min (ref >90)
Glucose, Bld: 136 mg/dL — ABNORMAL HIGH (ref 70–99)
Potassium: 4.9 mEq/L (ref 3.7–5.3)
Sodium: 132 mEq/L — ABNORMAL LOW (ref 137–147)

## 2013-09-29 MED ORDER — MOVING RIGHT ALONG BOOK
Freq: Once | Status: AC
  Administered 2013-09-29: 06:00:00
  Filled 2013-09-29: qty 1

## 2013-09-29 MED ORDER — POTASSIUM CHLORIDE CRYS ER 20 MEQ PO TBCR
20.0000 meq | EXTENDED_RELEASE_TABLET | Freq: Every day | ORAL | Status: AC
  Administered 2013-09-30 – 2013-10-02 (×3): 20 meq via ORAL
  Filled 2013-09-29 (×4): qty 1

## 2013-09-29 MED ORDER — SODIUM CHLORIDE 0.9 % IJ SOLN
3.0000 mL | INTRAMUSCULAR | Status: DC | PRN
  Administered 2013-09-29: 3 mL via INTRAVENOUS

## 2013-09-29 MED ORDER — SODIUM CHLORIDE 0.9 % IV SOLN: 250.0000 mL | INTRAVENOUS | Status: DC | PRN

## 2013-09-29 MED ORDER — FUROSEMIDE 40 MG PO TABS: 40.0000 mg | ORAL_TABLET | Freq: Every day | ORAL | Status: DC

## 2013-09-29 MED ORDER — TRAMADOL HCL 50 MG PO TABS
50.0000 mg | ORAL_TABLET | ORAL | Status: DC | PRN
  Administered 2013-10-03: 50 mg via ORAL
  Filled 2013-09-29: qty 1

## 2013-09-29 MED ORDER — FUROSEMIDE 40 MG PO TABS
40.0000 mg | ORAL_TABLET | Freq: Two times a day (BID) | ORAL | Status: DC
  Administered 2013-09-30 – 2013-10-03 (×7): 40 mg via ORAL
  Filled 2013-09-29 (×9): qty 1

## 2013-09-29 MED ORDER — LOSARTAN POTASSIUM 25 MG PO TABS
25.0000 mg | ORAL_TABLET | Freq: Two times a day (BID) | ORAL | Status: DC
  Administered 2013-09-30 – 2013-10-03 (×7): 25 mg via ORAL
  Filled 2013-09-29 (×8): qty 1

## 2013-09-29 MED ORDER — SODIUM CHLORIDE 0.9 % IJ SOLN
3.0000 mL | Freq: Two times a day (BID) | INTRAMUSCULAR | Status: DC
  Administered 2013-09-29 – 2013-10-02 (×8): 3 mL via INTRAVENOUS

## 2013-09-29 MED ORDER — SPIRONOLACTONE 12.5 MG HALF TABLET
12.5000 mg | ORAL_TABLET | Freq: Every day | ORAL | Status: DC
  Administered 2013-09-29 – 2013-10-03 (×5): 12.5 mg via ORAL
  Filled 2013-09-29 (×5): qty 1

## 2013-09-29 MED ORDER — LOSARTAN POTASSIUM 25 MG PO TABS: 25.0000 mg | ORAL_TABLET | Freq: Two times a day (BID) | ORAL | Status: DC

## 2013-09-29 MED ORDER — ASPIRIN EC 81 MG PO TBEC
81.0000 mg | DELAYED_RELEASE_TABLET | Freq: Every day | ORAL | Status: DC
  Administered 2013-09-29 – 2013-10-02 (×4): 81 mg via ORAL
  Filled 2013-09-29 (×5): qty 1

## 2013-09-29 MED ORDER — FUROSEMIDE 10 MG/ML IJ SOLN
20.0000 mg | Freq: Four times a day (QID) | INTRAMUSCULAR | Status: AC
  Administered 2013-09-29 (×3): 20 mg via INTRAVENOUS
  Filled 2013-09-29 (×3): qty 2

## 2013-09-29 NOTE — Progress Notes (Addendum)
[facility] DAILY ICU PROGRESS NOTE                   [address]            [place] [address]          [phone]   2 Days Post-Op Procedure(s) (LRB): BENTALL PROCEDURE (N/A) ASCENDING AORTIC ROOT REPLACEMENT (N/A) MAZE (N/A) INTRAOPERATIVE TRANSESOPHAGEAL ECHOCARDIOGRAM (N/A)  Total Length of Stay:  LOS: 2 days   Subjective: conts ot feel well, feels a bit constipated  Objective: Vital signs in last 24 hours: Temp:  [97.8 F (36.6 C)-99 F (37.2 C)] 98.2 F (36.8 C) (03/12 0801) Pulse Rate:  [78-90] 79 (03/12 0700) Cardiac Rhythm:  [-] Atrial paced (03/12 0700) Resp:  [16-42] 42 (03/12 0700) BP: (97-126)/(63-92) 124/92 mmHg (03/12 0700) SpO2:  [92 %-98 %] 96 % (03/12 0700) Weight:  [177 lb 0.5 oz (80.3 kg)] 177 lb 0.5 oz (80.3 kg) (03/12 0600)  Filed Weights   09/26/13 1300 09/28/13 0500 09/29/13 0600  Weight: 165 lb (74.844 kg) 174 lb 13.2 oz (79.3 kg) 177 lb 0.5 oz (80.3 kg)    Weight change: 2 lb 3.3 oz (1 kg)   Hemodynamic parameters for last 24 hours: PAP: (42-53)/(13-22) 46/18 mmHg CO:  [5 L/min-5.3 L/min] 5.3 L/min CI:  [2.6 L/min/m2-2.8 L/min/m2] 2.8 L/min/m2  Intake/Output from previous day: 03/11 0701 - 03/12 0700 In: 1613.5 [P.O.:1020; I.V.:493.5; IV Piggyback:100] Out: 1275 [Urine:705; Chest Tube:570]  Intake/Output this shift:    Current Meds: Scheduled Meds: . acetaminophen  1,000 mg Oral 4 times per day  . aspirin EC  81 mg Oral Daily  . bisacodyl  10 mg Oral Daily   Or  . bisacodyl  10 mg Rectal Daily  . cefUROXime (ZINACEF)  IV  1.5 g Intravenous Q12H  . chlorhexidine  5 mL Mouth/Throat BID  . docusate sodium  200 mg Oral Daily  . furosemide  20 mg Intravenous 4 times per day  . [START ON 09/30/2013] furosemide  40 mg Oral BID  . [START ON 09/30/2013] losartan  25 mg Oral BID  . moving right along book   Does not apply Once  . mupirocin ointment   Nasal BID  . pantoprazole  40 mg Oral Daily  . [START ON 09/30/2013]  potassium chloride  20 mEq Oral Daily  . sodium chloride  3 mL Intravenous Q12H  . spironolactone  12.5 mg Oral Daily  . warfarin  2.5 mg Oral q1800  . Warfarin - Physician Dosing Inpatient   Does not apply q1800   Continuous Infusions:  PRN Meds:.sodium chloride, metoprolol, morphine injection, ondansetron (ZOFRAN) IV, oxyCODONE, sodium chloride, traMADol  General appearance: alert, cooperative and no distress Neurologic: intact Heart: regular rate and rhythm and crisp valve click Lungs: clear, mildly dim in bases Abdomen: + BS, Soft, nontende Extremities: some periph edema Wound: dressings CDI  Lab Results: CBC: Recent Labs  09/28/13 1635 09/28/13 1636 09/29/13 0335  WBC 14.6*  --  13.8*  HGB 10.7* 11.2* 10.5*  HCT 31.3* 33.0* 31.2*  PLT 100*  --  95*   BMET:  Recent Labs  09/28/13 0410  09/28/13 1636 09/29/13 0335  NA 139  --  137 132*  K 4.5  --  4.5 4.9  CL 106  --  99 96  CO2 23  --   --  24  GLUCOSE 136*  --  139* 136*  BUN 13  --  12 16  CREATININE 0.87  < > 0.90 1.00  CALCIUM 8.3*  --   --  8.7  < > = values in this interval not displayed.  PT/INR:  Recent Labs  09/29/13 0335  LABPROT 17.2*  INR 1.44   Radiology: Dg Chest Port 1 View  09/29/2013   CLINICAL DATA Aortic valve disease  EXAM PORTABLE CHEST - 1 VIEW  COMPARISON September 28, 2013  FINDINGS The Swan-Ganz catheter has been removed. Mediastinal drains and right chest tube remain as does the central catheter with tip at the cavoatrial junction. Temporary pacemaker wires are attached to the right heart. No pneumothorax.  There is consolidation in the left lower lobe. There is mild atelectasis in the right base. Lungs elsewhere are clear. Heart is enlarged with normal pulmonary vascularity, stable.  IMPRESSION Tube and catheter positions as described without pneumothorax. Consolidation remains in the left lower lobe. There is mild right base atelectasis.  SIGNATURE  Electronically Signed   By: Lowella Grip M.D.   On: 09/29/2013 08:05   Dg Chest Portable 1 View In Am  09/28/2013   CLINICAL DATA Postoperative aortic valve replacement  EXAM PORTABLE CHEST - 1 VIEW  COMPARISON September 27, 2013  FINDINGS Endotracheal tube and nasogastric tube have been removed. Swan-Ganz catheter tip is in the right main pulmonary artery. There is a mediastinal drain, unchanged in position. There is a catheter extending to the left atrium from below the diaphragm. Temporary pacemaker wires are attached to the right heart. There is no pneumothorax. There is consolidation in both medial lung bases, slightly increased bilaterally. Heart is enlarged with normal pulmonary vascularity. There is an aortic valve replacement present. There is also a clamp in the left atrial appendage region.  IMPRESSION Tube and catheter positions as described without pneumothorax. Stable cardiomegaly. Consolidation in both medial bases, slightly increased bilaterally.  SIGNATURE  Electronically Signed   By: Lowella Grip M.D.   On: 09/28/2013 07:41   Dg Chest Portable 1 View  09/27/2013   CLINICAL DATA Postop  EXAM PORTABLE CHEST - 1 VIEW  COMPARISON DG CHEST 2 VIEW dated 09/26/2013; CT ANGIO CHEST W/CM &/OR WO/CM dated 07/28/2013; DG CHEST 1V PORT dated 07/28/2013  FINDINGS Endotracheal tube tip is 4 cm above the carina. Swan-Ganz central line is seen with tip in the right main pulmonary artery. There is a right internal jugular central line adjacent to this with tip at the cavoatrial junction. NG tube crosses the gastroesophageal junction. There is a mediastinal drain and a right chest tube.  There is no pneumothorax. Mild left lower lobe atelectasis is present. The right lung is clear. Right costophrenic angle is excluded from the image.  IMPRESSION Acceptable postoperative appearance.  SIGNATURE  Electronically Signed   By: Skipper Cliche M.D.   On: 09/27/2013 15:10     Assessment/Plan: S/P Procedure(s) (LRB): BENTALL PROCEDURE  (N/A) ASCENDING AORTIC ROOT REPLACEMENT (N/A) MAZE (N/A) INTRAOPERATIVE TRANSESOPHAGEAL ECHOCARDIOGRAM (N/A)  1 conts to progress nicely 2 cont coumadin for mechanical valve 3 cont diuresis- only 705 cc urine recorded yesterday 4 CT' still with moderate drainage-570 cc out yesterday 5 labs stable 6  Junctional under pacer in 60-70's 7 sugars controlled   GOLD,WAYNE E 09/29/2013 8:09 AM   I have seen and examined the patient and agree with the assessment and plan as outlined.  Overall looks quite good.  Continue to hold metoprolol for now but rhythm recovering - probably will need to resume soon.  Restart ARB.  Mobilize.  Diuresis.  Leave soft mediastinal drain in place but d/c remaining tubes.  Some mild abdominal discomfort - will need to monitor.  Transfer 2W.  Samanth Mirkin H 09/29/2013 8:24 AM

## 2013-09-29 NOTE — Progress Notes (Signed)
Pt ambulated approximately 70 feet using front wheel walker accompanied by RN. Pt tolerated walk well and is now resting in bed. Will continue to monitor.   Coolidge Breeze, RN

## 2013-09-29 NOTE — Progress Notes (Signed)
CARDIAC REHAB PHASE I   PRE:  Rate/Rhythm:59 BP:  Supine:   Sitting: 110/78  Standing:    SaO2: 98 2L  MODE:  Ambulation: 70 ft   POST:  Rate/Rhythem: 79  BP:  Supine:   Sitting: 116/84  Standing:    SaO2: 92 2L during ambulation  Ambulated with assist x 2 using rolling walker.  Pace slow, gait fairly steady.  Small steps.  States this is his first time to walk since surgery.  Complains of sternal pain.  Encouraged to use ICS, PLB. Returned to bed per patient request.  Call bell in place.  Medication for pain by floor nurse.  Will follow up in am.  1:50 pm to 2:28 PM  Nicloe Frontera, Gretta Cool

## 2013-09-29 NOTE — Plan of Care (Signed)
Problem: Phase III - Recovery through Discharge Goal: Discharge plan remains appropriate-arrangements made Outcome: Completed/Met Date Met:  09/29/13 Will be staying with parents.

## 2013-09-30 ENCOUNTER — Inpatient Hospital Stay (HOSPITAL_COMMUNITY): Payer: BC Managed Care – PPO

## 2013-09-30 LAB — PROTIME-INR
INR: 1.14 (ref 0.00–1.49)
Prothrombin Time: 14.4 seconds (ref 11.6–15.2)

## 2013-09-30 LAB — COMPREHENSIVE METABOLIC PANEL
ALT: 29 U/L (ref 0–53)
AST: 46 U/L — ABNORMAL HIGH (ref 0–37)
Albumin: 3 g/dL — ABNORMAL LOW (ref 3.5–5.2)
Alkaline Phosphatase: 43 U/L (ref 39–117)
BUN: 18 mg/dL (ref 6–23)
CO2: 28 mEq/L (ref 19–32)
Calcium: 8.9 mg/dL (ref 8.4–10.5)
Chloride: 93 mEq/L — ABNORMAL LOW (ref 96–112)
Creatinine, Ser: 0.89 mg/dL (ref 0.50–1.35)
GFR calc Af Amer: >90 mL/min (ref >90)
GFR calc non Af Amer: >90 mL/min (ref >90)
Glucose, Bld: 145 mg/dL — ABNORMAL HIGH (ref 70–99)
Potassium: 4.5 mEq/L (ref 3.7–5.3)
Sodium: 136 mEq/L — ABNORMAL LOW (ref 137–147)
Total Bilirubin: 1.1 mg/dL (ref 0.3–1.2)
Total Protein: 5.7 g/dL — ABNORMAL LOW (ref 6.0–8.3)

## 2013-09-30 LAB — CBC
HCT: 30 % — ABNORMAL LOW (ref 39.0–52.0)
Hemoglobin: 10.4 g/dL — ABNORMAL LOW (ref 13.0–17.0)
MCH: 31 pg (ref 26.0–34.0)
MCHC: 34.7 g/dL (ref 30.0–36.0)
MCV: 89.3 fL (ref 78.0–100.0)
Platelets: 112 10*3/uL — ABNORMAL LOW (ref 150–400)
RBC: 3.36 MIL/uL — ABNORMAL LOW (ref 4.22–5.81)
RDW: 15.9 % — ABNORMAL HIGH (ref 11.5–15.5)
WBC: 10.4 10*3/uL (ref 4.0–10.5)

## 2013-09-30 MED ORDER — METOPROLOL SUCCINATE 12.5 MG HALF TABLET
12.5000 mg | ORAL_TABLET | Freq: Two times a day (BID) | ORAL | Status: DC
  Administered 2013-09-30 – 2013-10-03 (×7): 12.5 mg via ORAL
  Filled 2013-09-30 (×8): qty 1

## 2013-09-30 MED ORDER — MAGNESIUM HYDROXIDE 400 MG/5ML PO SUSP
30.0000 mL | Freq: Every day | ORAL | Status: DC | PRN
  Administered 2013-09-30: 30 mL via ORAL
  Filled 2013-09-30: qty 30

## 2013-09-30 MED ORDER — PATIENT'S GUIDE TO USING COUMADIN BOOK
Freq: Once | Status: AC
  Administered 2013-09-30: 18:00:00
  Filled 2013-09-30: qty 1

## 2013-09-30 MED ORDER — METOPROLOL SUCCINATE ER 50 MG PO TB24: 75.0000 mg | ORAL_TABLET | Freq: Every day | ORAL | Status: DC

## 2013-09-30 MED ORDER — WARFARIN SODIUM 5 MG PO TABS
5.0000 mg | ORAL_TABLET | Freq: Every day | ORAL | Status: DC
  Administered 2013-09-30: 5 mg via ORAL
  Filled 2013-09-30 (×2): qty 1

## 2013-09-30 MED ORDER — KETOROLAC TROMETHAMINE 15 MG/ML IJ SOLN
15.0000 mg | Freq: Four times a day (QID) | INTRAMUSCULAR | Status: AC
  Administered 2013-09-30 – 2013-10-01 (×5): 15 mg via INTRAVENOUS
  Filled 2013-09-30 (×5): qty 1

## 2013-09-30 MED ORDER — WARFARIN VIDEO
Freq: Once | Status: AC
  Administered 2013-10-01: 10:00:00

## 2013-09-30 NOTE — Discharge Instructions (Addendum)
Warfarin: What You Need to Know Warfarin is an anticoagulant. Anticoagulants help prevent the formation of blood clots. They also help stop the growth of blood clots. Warfarin is sometimes referred to as a "blood thinner."  Normally, when body tissues are cut or damaged, the blood clots in order to prevent blood loss. Sometimes clots form inside your blood vessels and obstruct the flow of blood through your circulatory system (thrombosis). These clots may travel through your bloodstream and become lodged in smaller blood vessels in your brain, which can cause a stroke, or your lungs (pulmonary embolism). WHO SHOULD USE WARFARIN? Warfarin is prescribed for people at risk of developing harmful blood clots:  People with surgically implanted mechanical heart valves, irregular heart rhythms called atrial fibrillation, and certain clotting disorders.  People who have developed harmful blood clotting in the past, including those who have had a stroke or a pulmonary embolism, or thrombosis in their legs (deep vein thrombosis [DVT]).  People with an existing blood clot such as a pulmonary embolism. WARFARIN DOSING Warfarin tablets come in different strengths. Each tablet strength is a different color, with the amount of warfarin (in milligrams) clearly printed on the tablet. If the color of your tablet is different than usual when you receive a new prescription, report it immediately to your pharmacist or health care provider. WARFARIN MONITORING The goal of warfarin therapy is to lessen the clotting tendency of blood but not to prevent clotting completely. Your health care provider will monitor the anticoagulation effect of warfarin closely and adjust your dose as needed. For your safety, blood tests called prothrombin time (PT) or international normalized ratio (INR) are used to measure the effects of warfarin. Both of these tests can be done with a finger stick or a blood draw. The longer it takes the blood  to clot, the higher the PT or INR. Your health care provider will inform you of your "target" PT or INR range. If, at any time, your PT or INR is above the target range, there is a risk of bleeding. If your PT or INR is below the target range, there is a risk of clotting. Whether you are started on warfarin while you are in the hospital, or in your health care provider's office, you will need to have your PT or INR checked within one week of starting the medicine. Initially, some people are asked to have their PT or INR checked as much as twice a week. Once you are on a stable maintenance dose, the PT or INR is checked less often, usually once every 2 to 4 weeks. The warfarin dose may be adjusted if the PT or INR is not within the target range. It is important to keep all laboratory and health care provider follow-up appointments.  WHAT ARE THE SIDE EFFECTS OF WARFARIN?  Too much warfarin can cause bleeding (hemorrhage) from any part of the body. This may include bleeding from the gums, blood in the urine, bloody or dark stools, a nosebleed that is not easily stopped, coughing up blood, or vomiting blood.  Too little warfarin can increase the risk of blood clots.  Too little or too much warfarin can also increase the risk of a stroke.  Warfarin use may cause a skin rash or irritation, an unusual fever, continual nausea or stomach upset, or severe pain in your joints or back. SPECIAL PRECAUTIONS WHILE TAKING WARFARIN Warfarin should be taken exactly as directed:  Take your medicine at the same time every day.  If you forget to take your dose, you can take it if it is within 6 hours of when it was due.  Do not change the dose of warfarin on your own to make up for missed or extra doses.  If you miss more than 2 doses in a row, you should contact your health care provider for advice. Avoid situations that cause bleeding. You may have a tendency to bleed more easily than usual while taking warfarin.  The following actions can limit bleeding:  Using a softer toothbrush.  Flossing with waxed floss rather than unwaxed floss.  Shaving with an Copy rather than a blade.  Limiting the use of sharp objects.  Avoiding potentially harmful activities such as contact sports. Warfarin and Pregnancy or Breastfeeding  Warfarin is not advised during the first trimester of pregnancy due to an increased risk of birth defects. In certain situations, a woman may take warfarin after her first trimester of pregnancy. A woman who becomes pregnant or plans to become pregnant while taking warfarin should notify her health care provider immediately.  Although warfarin does not pass into breast milk, a woman who wishes to breastfeed while taking warfarin should also consult with her health care provider. Alcohol, Smoking, and Illicit Drug Use  Alcohol affects how warfarin works in the body. It is best to avoid alcoholic drinks or consume very small amounts while taking warfarin. In general, alcohol intake should be limited to 1 oz (30 mL) of liquor, 6 oz (180 mL) of wine, or 12 oz (360 mL) of beer each day. Notify your health care provider if you change your alcohol intake.  Smoking affects how warfarin works. It is best to avoid smoking while taking warfarin. Notify your health care provider if you change your smoking habits.  It is best to avoid all illicit drugs while taking warfarin since there are few studies that show how warfarin interacts with these drugs. Other Medicines and Dietary Supplements Many prescription and over-the-counter medicines can interfere with warfarin. Be sure all of your health care providers know you are taking warfarin. Notify your health care provider who prescribed warfarin for you before starting or stopping any new medicines, including over-the-counter vitamins, dietary supplements, and pain medicines. Your warfarin dose may need to be adjusted. Some common  over-the-counter medicines that may increase the risk of bleeding while taking warfarin include:   Acetaminophen.  Aspirin.  Nonsteroidal anti-inflammatory medicines such as ibuprofen or naproxen.  Vitamin E. Dietary Considerations  Foods that have moderate or high amounts of vitamin K can interfere with warfarin. Avoid major changes in your diet or notify your health care provider before changing your diet. Eat a consistent amount of foods that have moderate or high amounts of vitamin K.Eating less foods containing vitamin K can increase the risk of bleeding. Eating more foods containing vitamin K can increase the risk of blood clots. Additional questions about dietary considerations can be discussed with a dietitian. The serving size for foods containing moderate or high amounts of vitamin K are  cup cooked (120 mL or noted gram weight) or 1 cup raw (240 mL or noted gram weight), unless otherwise noted. These foods include: Proteins  Beef liver, 3.5 oz (100 g).  Pork liver, 3.5 oz (100 g). Legumes  Soybean oil.  Soybeans.  Garbanzo beans.  Green peas.  Black-eyed peas. Leafy green vegetables  Kale.  Spinach.  Nettle greens.  Swiss chard.  Watercress.  Endive.  Parsley, 1 tbsp (4 g).  Turnip greens.  Collard greens.  Seaweed, limit 2 sheets.  Beet greens.  Dandelion greens.  Mustard greens.  Green Lead and Romaine lettuce. Cruciferous vegetables  Broccoli.  Cabbage (green or [place]).  Brussels sprouts.  Cauliflower.  Asparagus. Miscellaneous  Onions, green onions, or spring onions.  Green tea made with  oz (14 g) or more of dried tea.  Herbal teas containing coumarin.  Spinach noodles.  Okra.  Prunes.  Angie Fava. CALL YOUR CLINIC OR HEALTH CARE PROVIDER IF YOU:  Plan to have any surgery or procedure.  Feel sick, especially if you have diarrhea or vomiting.  Experience or anticipate any major changes in your diet.  Start or  stop a prescription or over-the-counter medicine.  Become, plan to become, or think you may be pregnant.  Are having heavier than usual menstrual periods.  Have had a fall, accident, or any symptoms of bleeding or unusual bruising.  An unusual fever. CALL 911 IN THE [place] OR GO TO THE EMERGENCY DEPARTMENT IF YOU:   Think you may be having an allergic reaction to warfarin. The signs of an allergic reaction could include itching, rash, hives, swelling, chest tightness, or trouble breathing.  See signs of blood in your urine. The signs could include reddish, pinkish, or tea-colored urine.  See signs of blood in your stools. The signs could include bright red or black stools.  Vomit or cough up blood. In these instances, the blood could have either a bright red or a "coffee-grounds" appearance.  Have bleeding that will not stop after applying pressure for 30 minutes such as cuts, nosebleeds, other injuries.  Have severe pain in your joints or back.  Have a new and severe headache.  Have sudden weakness or numbness of your face, arm, or leg, especially on one side of your body.  Have sudden confusion or trouble understanding.  Have sudden trouble seeing in one or both eyes.  Have sudden trouble walking, dizziness, loss of balance, or coordination.  Have aphasia. Document Released: 07/07/2005 Document Revised: 03/31/2012 Document Reviewed: 12/31/2012 [facility] Patient Information 2014 [place]. Aortic Valve Replacement Care After Refer to this sheet in the next few weeks. These instructions provide you with information on caring for yourself after your procedure. Your caregiver may also give you specific instructions. Your treatment has been planned according to current medical practices, but problems sometimes occur. Call your caregiver if you have any problems or questions after your procedure. HOME CARE INSTRUCTIONS   Only take over-the-counter or prescription medicines as  directed by your caregiver.  Take your temperature every morning for the first 7 days after surgery. Write these down. Call your caregiver if your temperature stays above 100 F (37.8 C) for more than a day.   Weigh yourself every morning for at least 7 days after surgery. Write your weight down to monitor any weight increase.  Wear elastic stockings during the day for at least 2 weeks after surgery. Use them longer if your ankles are swollen. The stockings help blood flow and help reduce swelling in the legs.  Take frequent naps or rest often throughout the day.  Avoid lifting over 10 lbs (4.5 kg) or pushing or pulling things with your arms for 6 8 weeks or as directed by your caregiver.  Avoid driving or airplane travel for 4 6 weeks after surgery or as directed. If you are riding in a car for an extended period, stop every 1 2 hours to stretch your legs. Keep a record  of your medicines and medical history with you when traveling.  Do not cross your legs.  Do not take baths for 4 6 weeks after surgery. Take showers once your caregiver approves. Pat incisions dry. Do not rub incisions with a washcloth or towel.  Avoid climbing stairs and using the handrail to pull yourself up for the first 2 3 weeks after surgery.  Return to work as directed by your caregiver.  Drink enough fluids to keep your urine clear or pale yellow.  Do not strain to have a bowel movement. Eat high-fiber foods if you become constipated. You may also take a medicine to help you have a bowel movement (laxative) as directed by your caregiver.  Resume sexual activity as directed by your caregiver. Men should not use medicines for erectile dysfunction until their doctor says it isokay.  If you had a certain type of heart condition in the past, you may need to take antibiotic medicine before having dental work or surgery. Let your dentist and caregivers know if you had one or more of the following:  Previous  endocarditis.  An artificial (prosthetic) heart valve.  Congenital heart disease. SEEK MEDICAL CARE IF:  You develop a skin rash.   Your weight is increasing each day over 2 3 days, or you have a sudden weight gain.  Your weight increases by 2 or more pounds (1 kg or more) in a single day. SEEK IMMEDIATE MEDICAL CARE IF:   You develop chest pain that is not coming from your incision.   You develop shortness of breath or have difficulty breathing.   You have a fever.   You have increased bleeding from your wounds.   You have increasing wound pain.   You have redness or swelling around your wounds  You have pus coming from your wound.   You develop lightheadedness.  MAKE SURE YOU:   Understand these directions.  Will watch your condition.  Will get help right away if you are not doing well or get worse. Document Released: 01/23/2005 Document Revised: 06/23/2012 Document Reviewed: 04/20/2012 [facility] Patient Information 2014 [place], [place].   Information on my medicine - Coumadin   (Warfarin) Why was Coumadin prescribed for you? Coumadin was prescribed for you because you have a blood clot or a medical condition that can cause an increased risk of forming blood clots. Blood clots can cause serious health problems by blocking the flow of blood to the heart, lung, or brain. Coumadin can prevent harmful blood clots from forming. As a reminder your indication for Coumadin is:   Stroke Prevention Because Of Atrial Fibrillation  What test will check on my response to Coumadin? While on Coumadin (warfarin) you will need to have an INR test regularly to ensure that your dose is keeping you in the desired range. The INR (international normalized ratio) number is calculated from the result of the laboratory test called prothrombin time (PT).  If an INR APPOINTMENT HAS NOT ALREADY BEEN MADE FOR YOU please schedule an appointment to have this lab work done by your health  care provider within 7 days. Your INR goal is usually a number between:  2 to 3 or your provider may give you a more narrow range like 2-2.5.  Ask your health care provider during an office visit what your goal INR is.  What  do you need to  know  About  COUMADIN? Take Coumadin (warfarin) exactly as prescribed by your healthcare provider about the same time each  day.  DO NOT stop taking without talking to the doctor who prescribed the medication.  Stopping without other blood clot prevention medication to take the place of Coumadin may increase your risk of developing a new clot or stroke.  Get refills before you run out.  What do you do if you miss a dose? If you miss a dose, take it as soon as you remember on the same day then continue your regularly scheduled regimen the next day.  Do not take two doses of Coumadin at the same time.  Important Safety Information A possible side effect of Coumadin (Warfarin) is an increased risk of bleeding. You should call your healthcare provider right away if you experience any of the following:   Bleeding from an injury or your nose that does not stop.   Unusual colored urine (red or dark brown) or unusual colored stools (red or black).   Unusual bruising for unknown reasons.   A serious fall or if you hit your head (even if there is no bleeding).  Some foods or medicines interact with Coumadin (warfarin) and might alter your response to warfarin. To help avoid this:   Eat a balanced diet, maintaining a consistent amount of Vitamin K.   Notify your provider about major diet changes you plan to make.   Avoid alcohol or limit your intake to 1 drink for women and 2 drinks for men per day. (1 drink is 5 oz. wine, 12 oz. beer, or 1.5 oz. liquor.)  Make sure that ANY health care provider who prescribes medication for you knows that you are taking Coumadin (warfarin).  Also make sure the healthcare provider who is monitoring your Coumadin knows when you have  started a new medication including herbals and non-prescription products.  Coumadin (Warfarin)  Major Drug Interactions  Increased Warfarin Effect Decreased Warfarin Effect  Alcohol (large quantities) Antibiotics (esp. Septra/Bactrim, Flagyl, Cipro) Amiodarone (Cordarone) Aspirin (ASA) Cimetidine (Tagamet) Megestrol (Megace) NSAIDs (ibuprofen, naproxen, etc.) Piroxicam (Feldene) Propafenone (Rythmol SR) Propranolol (Inderal) Isoniazid (INH) Posaconazole (Noxafil) Barbiturates (Phenobarbital) Carbamazepine (Tegretol) Chlordiazepoxide (Librium) Cholestyramine (Questran) Griseofulvin Oral Contraceptives Rifampin Sucralfate (Carafate) Vitamin K   Coumadin (Warfarin) Major Herbal Interactions  Increased Warfarin Effect Decreased Warfarin Effect  Garlic Ginseng Ginkgo biloba Coenzyme Q10 Green tea St. Johns wort    Coumadin (Warfarin) FOOD Interactions  Eat a consistent number of servings per week of foods HIGH in Vitamin K (1 serving =  cup)  Collards (cooked, or boiled & drained) Kale (cooked, or boiled & drained) Mustard greens (cooked, or boiled & drained) Parsley *serving size only =  cup Spinach (cooked, or boiled & drained) Swiss chard (cooked, or boiled & drained) Turnip greens (cooked, or boiled & drained)  Eat a consistent number of servings per week of foods MEDIUM-HIGH in Vitamin K (1 serving = 1 cup)  Asparagus (cooked, or boiled & drained) Broccoli (cooked, boiled & drained, or raw & chopped) Brussel sprouts (cooked, or boiled & drained) *serving size only =  cup Lettuce, raw (green leaf, endive, romaine) Spinach, raw Turnip greens, raw & chopped   These websites have more information on Coumadin (warfarin):  FailFactory.se; VeganReport.com.au;   Aortic Valve Replacement Care After Refer to this sheet in the next few weeks. These instructions provide you with information on caring for yourself after your procedure. Your  caregiver may also give you specific instructions. Your treatment has been planned according to current medical practices, but problems sometimes occur. Call your caregiver if you have any  problems or questions after your procedure. HOME CARE INSTRUCTIONS   Only take over-the-counter or prescription medicines as directed by your caregiver.  Take your temperature every morning for the first 7 days after surgery. Write these down. Call your caregiver if your temperature stays above 100 F (37.8 C) for more than a day.   Weigh yourself every morning for at least 7 days after surgery. Write your weight down to monitor any weight increase.  Wear elastic stockings during the day for at least 2 weeks after surgery. Use them longer if your ankles are swollen. The stockings help blood flow and help reduce swelling in the legs.  Take frequent naps or rest often throughout the day.  Avoid lifting over 10 lbs (4.5 kg) or pushing or pulling things with your arms for 6 8 weeks or as directed by your caregiver.  Avoid driving or airplane travel for 4 6 weeks after surgery or as directed. If you are riding in a car for an extended period, stop every 1 2 hours to stretch your legs. Keep a record of your medicines and medical history with you when traveling.  Do not cross your legs.  Do not take baths for 4 6 weeks after surgery. Take showers once your caregiver approves. Pat incisions dry. Do not rub incisions with a washcloth or towel.  Avoid climbing stairs and using the handrail to pull yourself up for the first 2 3 weeks after surgery.  Return to work as directed by your caregiver.  Drink enough fluids to keep your urine clear or pale yellow.  Do not strain to have a bowel movement. Eat high-fiber foods if you become constipated. You may also take a medicine to help you have a bowel movement (laxative) as directed by your caregiver.  Resume sexual activity as directed by your caregiver. Men should  not use medicines for erectile dysfunction until their doctor says it isokay.  If you had a certain type of heart condition in the past, you may need to take antibiotic medicine before having dental work or surgery. Let your dentist and caregivers know if you had one or more of the following:  Previous endocarditis.  An artificial (prosthetic) heart valve.  Congenital heart disease. SEEK MEDICAL CARE IF:  You develop a skin rash.   Your weight is increasing each day over 2 3 days, or you have a sudden weight gain.  Your weight increases by 2 or more pounds (1 kg or more) in a single day. SEEK IMMEDIATE MEDICAL CARE IF:   You develop chest pain that is not coming from your incision.   You develop shortness of breath or have difficulty breathing.   You have a fever.   You have increased bleeding from your wounds.   You have increasing wound pain.   You have redness or swelling around your wounds  You have pus coming from your wound.   You develop lightheadedness.  MAKE SURE YOU:   Understand these directions.  Will watch your condition.  Will get help right away if you are not doing well or get worse. Document Released: 01/23/2005 Document Revised: 06/23/2012 Document Reviewed: 04/20/2012 [facility] Patient Information 2014 [place], [place].

## 2013-09-30 NOTE — Progress Notes (Addendum)
[address]       [place] [address]             [phone]      3 Days Post-Op  Procedure(s) (LRB): BENTALL PROCEDURE (N/A) ASCENDING AORTIC ROOT REPLACEMENT (N/A) MAZE (N/A) INTRAOPERATIVE TRANSESOPHAGEAL ECHOCARDIOGRAM (N/A) Subjective: C/o mild constipation, otherwise no specific issues. Strength improving   Objective  Telemetry sinus, pvc's  Temp:  [97.7 F (36.5 C)-98.1 F (36.7 C)] 97.7 F (36.5 C) (03/13 0600) Pulse Rate:  [65-87] 87 (03/13 0600) Resp:  [18-42] 20 (03/13 0600) BP: (122-140)/(80-91) 125/84 mmHg (03/13 0600) SpO2:  [92 %-99 %] 92 % (03/13 0600) Weight:  [170 lb 3.1 oz (77.2 kg)] 170 lb 3.1 oz (77.2 kg) (03/13 0600)   Intake/Output Summary (Last 24 hours) at 09/30/13 0836 Last data filed at 09/30/13 0658  Gross per 24 hour  Intake    480 ml  Output   2340 ml  Net  -1860 ml       General appearance: alert, cooperative and no distress Heart: regular rate and rhythm Lungs: dim in bases Abdomen: benign exam Extremities: min edema Wound: dressing CDI  Lab Results:  Recent Labs  09/28/13 0410 09/28/13 1635  09/29/13 0335 09/30/13 0612  NA 139  --   < > 132* 136*  K 4.5  --   < > 4.9 4.5  CL 106  --   < > 96 93*  CO2 23  --   --  24 28  GLUCOSE 136*  --   < > 136* 145*  BUN 13  --   < > 16 18  CREATININE 0.87 0.75  < > 1.00 0.89  CALCIUM 8.3*  --   --  8.7 8.9  MG 2.2 2.1  --   --   --   < > = values in this interval not displayed.  Recent Labs  09/30/13 0612  AST 46*  ALT 29  ALKPHOS 43  BILITOT 1.1  PROT 5.7*  ALBUMIN 3.0*   No results found for this basename: LIPASE, AMYLASE,  in the last 72 hours  Recent Labs  09/29/13 0335 09/30/13 0612  WBC 13.8* 10.4  HGB 10.5* 10.4*  HCT 31.2* 30.0*  MCV 89.4 89.3  PLT 95* 112*   No results found for this basename: CKTOTAL, CKMB, TROPONINI,  in the last 72 hours No components found with this basename: POCBNP,  No results found for this basename:  DDIMER,  in the last 72 hours No results found for this basename: HGBA1C,  in the last 72 hours No results found for this basename: CHOL, HDL, LDLCALC, TRIG, CHOLHDL,  in the last 72 hours No results found for this basename: TSH, T4TOTAL, FREET3, T3FREE, THYROIDAB,  in the last 72 hours No results found for this basename: VITAMINB12, FOLATE, FERRITIN, TIBC, IRON, RETICCTPCT,  in the last 72 hours  Medications: Scheduled . acetaminophen  1,000 mg Oral 4 times per day  . aspirin EC  81 mg Oral Daily  . bisacodyl  10 mg Oral Daily   Or  . bisacodyl  10 mg Rectal Daily  . chlorhexidine  5 mL Mouth/Throat BID  . docusate sodium  200 mg Oral Daily  . furosemide  40 mg Oral BID  . losartan  25 mg Oral BID  . mupirocin ointment   Nasal BID  . pantoprazole  40 mg Oral Daily  . potassium chloride  20 mEq Oral Daily  .  sodium chloride  3 mL Intravenous Q12H  . spironolactone  12.5 mg Oral Daily  . warfarin  2.5 mg Oral q1800  . Warfarin - Physician Dosing Inpatient   Does not apply q1800     Radiology/Studies:  Dg Chest 2 View  09/30/2013   CLINICAL DATA:  Followup atelectasis.  EXAM: CHEST  2 VIEW  COMPARISON:  DG CHEST 1V PORT dated 09/29/2013; CT ANGIO CHEST W/CM &/OR WO/CM dated 07/28/2013; DG CHEST 1V PORT dated 07/28/2013  FINDINGS: Trachea is midline. Heart is enlarged, stable. Eight intact sternotomy wires. Epicardial pacer wires remain in place. There are small bilateral effusions with bibasilar airspace disease, left greater than right. Right chest tube remains in place. No pneumothorax.  IMPRESSION: Small bilateral effusions and bibasilar airspace disease, left greater than right, stable. Right chest tube in place.   Electronically Signed   By: Lorin Picket M.D.   On: 09/30/2013 08:23   Dg Chest Port 1 View  09/29/2013   CLINICAL DATA Aortic valve disease  EXAM PORTABLE CHEST - 1 VIEW  COMPARISON September 28, 2013  FINDINGS The Swan-Ganz catheter has been removed. Mediastinal drains and  right chest tube remain as does the central catheter with tip at the cavoatrial junction. Temporary pacemaker wires are attached to the right heart. No pneumothorax.  There is consolidation in the left lower lobe. There is mild atelectasis in the right base. Lungs elsewhere are clear. Heart is enlarged with normal pulmonary vascularity, stable.  IMPRESSION Tube and catheter positions as described without pneumothorax. Consolidation remains in the left lower lobe. There is mild right base atelectasis.  SIGNATURE  Electronically Signed   By: Lowella Grip M.D.   On: 09/29/2013 08:05    INR:1.14 Will add last result for INR, ABG once components are confirmed Will add last 4 CBG results once components are confirmed  Assessment/Plan: S/P Procedure(s) (LRB): BENTALL PROCEDURE (N/A) ASCENDING AORTIC ROOT REPLACEMENT (N/A) MAZE (N/A) INTRAOPERATIVE TRANSESOPHAGEAL ECHOCARDIOGRAM (N/A)  1 conts to do well 2 d/c chest tube today 3 cont rehab 4 pulm toilet 5 ac rx- give 5 mg coumadin today 6 add MOM 7 labs stable     LOS: 3 days    GOLD,WAYNE E 3/13/20158:36 AM  I have seen and examined the patient and agree with the assessment and plan as outlined.  Will restart Toprol XL at small, divided dose.  Will not plan to resume amiodarone unless patient develops Afib.  D/C pacing wires in am tomorrow if rhythm stable.  Possible D/C home Sunday or Monday depending on progress.  OWEN,CLARENCE H 09/30/2013 9:43 AM

## 2013-09-30 NOTE — Discharge Summary (Signed)
[name] Hinton       [name] Hinton,[name] Hinton             [phone]       [name] Hinton Apr 30, 1971 43 y.o. [id number]  09/27/2013   [name], MD  AI AFIB AORTIC ROOT ANEURSYM   HPI: At time of consultation: Patient is a 43 year old single white male with no significant past medical history was admitted to the hospital 07/28/2013 with acute exacerbation of likely chronic systolic congestive heart failure with rapid persistent atrial flutter. Transthoracic echocardiogram revealed bicuspid aortic valve with severe aortic insufficiency and dilated left ventricle with severe left ventricular systolic dysfunction with ejection fraction 25%. 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 The patient's congestive heart failure improved with rate control and diuresis, and he underwent DC cardioversion, successfully converting him to sinus rhythm. Transesophageal echocardiogram performed at the time of cardioversion confirmed the presence of bicuspid aortic valve with severe aortic insufficiency. Cardiothoracic surgical consultation was requested. He was originally seen in consultation during his hospitalization on 08/01/2013. He has been anticoagulated using Xarelto since that time. He is clinically done well since hospital discharge and has maintained sinus rhythm. He subsequently underwent left and right heart catheterization by Dr. Aundra Dubin on 09/05/2013. He was found to have no significant coronary artery disease but he did have elevated pulmonary catheter wedge pressure and left ventricular end-diastolic pressure with moderate pulmonary hypertension. He returns to the office today to discuss possible elective surgical intervention. He is been cleared for surgery by Dr. Lawana Chambers    The patient states that he has been healthy and physically active all of his life. He describes a 3 month history of progressive symptoms of shortness of breath which gradually got worse until he presented last week with resting shortness of breath, chest discomfort, and orthopnea. He has never been told that he had a heart murmur in the past and he has never had any problems with physical activity or exercise intolerance. He states that over the last few months symptoms have waxed and waned. He initially thought that may be related to alcohol, and he admits to drinking as much as 25 alcoholic drinks per week for the last 20 years. The patient also reports a history of cigarette smoking, although he quit 4 years ago. 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 He has been abstinent from the use of alcohol for the past 4 weeks.  The patient reports that he has been feeling quite well over the past month. His breathing is much better than it was at the time of his hospitalization in early January. He denies any exertional shortness of breath, PND, orthopnea, or lower extremity edema. He has never had any chest discomfort. He has not had any tachypalpitations or dizzy spells. The patient was counseled at length regarding surgical alternatives as well as risks and benefits and he agreed to [facility] root replacement and Maze procedure. He was admitted this hospitalization for the procedure. Past Medical History   Diagnosis  Date   .  ETOH abuse    .  Systolic CHF      a. Dx 07/2013: EF 25%, felt likely related to combo of EtOH + tachy-mediated + AI. LHC pending.   [name] Hinton  PAF (  paroxysmal atrial fibrillation)      a. Dx 07/2013, s/p TEE/DCCV.   .  Paroxysmal atrial flutter      a. Dx 07/2013.   [name] Hinton  Severe aortic insufficiency      a. Bicuspid aortic valve with severe AI, dilated ascending aorta dx 07/2013.   [name] Hinton  Heart murmur    .  Dysrhythmia     Past Surgical History   Procedure   Laterality  Date   .  No past surgeries     .  Tee without cardioversion  N/A  08/01/2013     Procedure: TRANSESOPHAGEAL ECHOCARDIOGRAM (TEE); Surgeon: [name], MD; Location: [facility]; Service: Cardiovascular; Laterality: N/A;   .  Cardioversion  N/A  08/01/2013     Procedure: CARDIOVERSION; Surgeon: [name], MD; Location: [facility] ENDOSCOPY; Service: Cardiovascular; Laterality: N/A;   .  Cardiac catheterization      Family History   Problem  Relation  Age of Onset   .  Heart failure  Father    .  Coronary artery disease  Father      CABG    Social History  History   Substance Use Topics   .  Smoking status:  Former Smoker -- 1.50 packs/day for 20 years     Quit date:  09/25/2009   .  Smokeless tobacco:  Never Used   .  Alcohol Use:  No      Comment: 25 drinks/week - occasional in 2 months.only 1 drink in past 2 months-09/23/13    Prior to Admission medications   Medication  Sig  Start Date  End Date  Taking?  Authorizing Provider   amoxicillin (AMOXIL) 500 MG capsule  Take four capsules one hour before dental appointment.  08/11/13   Yes  Lenn Cal, DDS   chlorhexidine (PERIDEX) 0.12 % solution  Rinse with 15 mls twice daily for 30 seconds. Use after breakfast and at bedtime. Spit out excess. Do not swallow.  08/11/13   Yes  Lenn Cal, DDS   furosemide (LASIX) 40 MG tablet  Take 40 mg by mouth 2 (two) times daily.  08/10/13   Yes  Amy D Clegg, NP   losartan (COZAAR) 25 MG tablet  Take 1 tablet (25 mg total) by mouth 2 (two) times daily.  08/10/13   Yes  Amy D Clegg, NP   metoprolol succinate (TOPROL-XL) 25 MG 24 hr tablet  Take 3 tablets (75 mg total) by mouth daily. Take with or immediately following a meal.  08/10/13   Yes  Amy D Clegg, NP   spironolactone (ALDACTONE) 25 MG tablet  Take 0.5 tablets (12.5 mg total) by mouth daily.  08/01/13   Yes  Dayna N Dunn, PA-C   amiodarone (PACERONE) 200 MG tablet  Take 200 mg by mouth 2 (two) times daily. Begin 7 days prior  to surgery.  09/20/13    [name], MD   No Known Allergies     Hospital Course:  The patient was admitted to the hospital and taken to the operating room on 09/27/2013 and underwent Procedure(s):  Date of Procedure: 09/27/2013  Preoperative Diagnosis:  Bicuspid Aortic Valve  Severe Aortic Insufficiency  Aortic Root Aneurysm  Ascending Thoracic Aortic Aneurysm  Persistent Atrial Fibrillation and Atrial Flutter  Postoperative Diagnosis: Same  Procedure:  Bentall Aortic Root Replacement Sorin Carbomedics Carbo-seal Mechanical Valve Conduit (valve size 29 mm, graft size 32 mm, catalog # [id number], serial # [id number])  Maze  Procedure Complete bilateral atrial lesion set using cryothermy and bipolar radiofrequency ablation  Obliteration of Left Atrial Appendage (Atricure LA Clip, size 40 mm)  Surgeon: Valentina Gu. 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 He is neurologically intact. He was weaned from the ventilator without difficulty. Inotropic support was weaned without difficulty. All routine lines, monitors and drainage devices have been discontinued in the standard fashion. He is tolerating gradually increasing activities using standard protocols. Incisions are healing well.     Recent Labs  10/01/13 0450  NA 138  K 3.7  CL 96  CO2 31  GLUCOSE 104*  BUN 22  CALCIUM 8.7   No results found for this basename: WBC, HGB, HCT, PLT,  in the last 72 hours  Recent Labs  10/02/13 0356 10/03/13 0430  INR 1.39 2.24*     Discharge Instructions:  The patient is discharged to home with extensive instructions on wound care and  progressive ambulation.  They are instructed not to drive or perform any heavy lifting until returning to see the physician in his office.  Discharge Diagnosis:  AI AFIB AORTIC ROOT ANEURSYM  Secondary Diagnosis: Patient Active Problem List   Diagnosis Date Noted  . 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 S/P Maze operation for atrial fibrillation 09/27/2013  . 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 Non-ischemic cardiomyopathy   . Chronic systolic CHF (congestive heart failure) 08/10/2013  . Bicuspid aortic valve 08/10/2013  . Aortic insufficiency 08/10/2013  . Thoracic aortic aneurysm 08/10/2013  . Atrial fibrillation 08/10/2013  . CHF (congestive heart failure) 07/28/2013   Past Medical History  Diagnosis Date  . ETOH abuse   . Systolic CHF     a. Dx 07/2013: EF 25%, felt likely related to combo of EtOH + tachy-mediated + AI. LHC pending.  [name] Hinton PAF (paroxysmal atrial fibrillation)     a. Dx 07/2013, s/p TEE/DCCV.  . Paroxysmal atrial flutter     a. Dx 07/2013.  [name] Hinton Severe aortic insufficiency     a. Bicuspid aortic valve with severe AI, dilated ascending aorta dx 07/2013.  [name] Hinton Heart murmur   . Dysrhythmia   . 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 S/P Maze operation for atrial fibrillation 09/27/2013    Complete bilateral atrial lesion set using bipolar radiofrequency and cryothermy ablation with clipping of LA appendage  . 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 Non-ischemic cardiomyopathy    The patient has been discharged on:   1.Beta Blocker:  Yes [  x ]                              No   [   ]                              If No, reason:  2.Ace Inhibitor/ARB: Yes [   x]  No  [    ]                                     If No,  reason:  3.Statin:   Yes [   ]                  No  [  x ]                  If No, reason:NO CAD  4.S[place]a [place]:  Yes  [   ]                  No   [ x  ]                  If No, reason:NO CAD  Medications at discharge:   Medication List    STOP taking these medications       amiodarone 200 MG tablet  Commonly known as:  PACERONE      TAKE these medications       amoxicillin 500 MG capsule  Commonly known as:  AMOXIL  Take four capsules one hour before dental appointment.     chlorhexidine 0.12 % solution  Commonly known as:  PERIDEX  Rinse with 15 mls twice daily for 30 seconds. Use after breakfast and at bedtime. Spit out excess. Do not swallow.     furosemide 40 MG tablet  Commonly known as:  LASIX  Take 40 mg by mouth 2 (two) times daily.     losartan 25 MG tablet  Commonly known as:  COZAAR  Take 1 tablet (25 mg total) by mouth 2 (two) times daily.     metoprolol succinate 25 MG 24 hr tablet  Commonly known as:  TOPROL-XL  Take 0.5 tablets (12.5 mg total) by mouth 2 (two) times daily. Take with or immediately following a meal.     oxyCODONE 5 MG immediate release tablet  Commonly known as:  Oxy IR/ROXICODONE  Take 1-2 tablets (5-10 mg total) by mouth every 4 (four) hours as needed for moderate pain.     spironolactone 25 MG tablet  Commonly known as:  ALDACTONE  Take 0.5 tablets (12.5 mg total) by mouth daily.     warfarin 5 MG tablet  Commonly known as:  COUMADIN  Take 1 tablet (5 mg total) by mouth daily at 6 PM.       Follow-up Information   Follow up with [name], MD. (10/17/2013 at 4 PM to see Dr. Roxy Manns. Please obtain a chest x-ray [facility] at 3 PM. [facility] is located in the same office complex.)    Specialty:  Cardiothoracic Surgery   Contact information:   [place] [address] [phone]       Follow up with Glori Bickers, MD. (2 weeks- call office to arrange if appt not already made)     Specialty:  Cardiology   Contact information:   [place] [address] [phone]       Follow up with Glori Bickers, MD. (Call for a PT and INR to be drawn on Wednesday 10/05/2013)    Specialty:  Cardiology   Contact information:   [place] [address] [phone]       Disposition: Stable  Patient's condition is Good  Jadene Pierini, [place] 10/03/2013  9:44 AM

## 2013-09-30 NOTE — Progress Notes (Signed)
Pt did complain about not being able to move his bawels since 3/10 please advise

## 2013-09-30 NOTE — Progress Notes (Signed)
CARDIAC REHAB PHASE I   PRE:  Rate/Rhythm: 72SR  BP:  Supine: 112/80  Sitting:   Standing:    SaO2: 92%RA  MODE:  Ambulation: 550 ft   POST:  Rate/Rhythm: 86SR  BP:  Supine:   Sitting: 110/82  Standing:    SaO2: 88%RA room, 92%RA with rest 1425-1520 Pt walked 550 ft with steady gait and tolerated well. Slight desat on RA which pt recovered with rest. Encouraged IS. Education completed with pt who voiced understanding. Has CHF booklet at home and knows he is to weigh self daily when he is discharged. Gave low sodium diets and asked pharmacist to see pt re Coumadin ed. Gave pt video listings and encouraged him to watch post OHS video and anticoagulation. Discussed CRP 2 and pt gave permission to refer to [facility] program. Pt receptive to ed. Will follow up and check sats on RA again tomorrow with walking.   Graylon Good, RN BSN  09/30/2013 3:19 PM

## 2013-09-30 NOTE — Progress Notes (Signed)
09/30/13 PT CT removed as ordered. Patient tolerated well. Dressing applied. Will monitor patient. Danielle Mink, Bettina Gavia RN

## 2013-10-01 LAB — PROTIME-INR
INR: 1.24 (ref 0.00–1.49)
Prothrombin Time: 15.3 seconds — ABNORMAL HIGH (ref 11.6–15.2)

## 2013-10-01 LAB — BASIC METABOLIC PANEL
BUN: 22 mg/dL (ref 6–23)
CO2: 31 mEq/L (ref 19–32)
Calcium: 8.7 mg/dL (ref 8.4–10.5)
Chloride: 96 mEq/L (ref 96–112)
Creatinine, Ser: 0.92 mg/dL (ref 0.50–1.35)
GFR calc Af Amer: >90 mL/min (ref >90)
GFR calc non Af Amer: >90 mL/min (ref >90)
Glucose, Bld: 104 mg/dL — ABNORMAL HIGH (ref 70–99)
Potassium: 3.7 mEq/L (ref 3.7–5.3)
Sodium: 138 mEq/L (ref 137–147)

## 2013-10-01 MED ORDER — WARFARIN SODIUM 7.5 MG PO TABS
7.5000 mg | ORAL_TABLET | Freq: Every day | ORAL | Status: DC
  Administered 2013-10-01: 7.5 mg via ORAL
  Filled 2013-10-01 (×2): qty 1

## 2013-10-01 NOTE — Progress Notes (Signed)
EPW d/c at this time per MD order; bedrest  Until 1334; pt tolerated well; VSS; will cont. To monitor.

## 2013-10-01 NOTE — Progress Notes (Signed)
Pt ambulated independently approximately 600 ft on RA.  Pt with steady gait.  Pt tolerated well.  Pt back to bed with call bell in reach.  Will continue to monitor closely.

## 2013-10-01 NOTE — Progress Notes (Addendum)
[address]       [place] [address]             [phone]      4 Days Post-Op  Procedure(s) (LRB): BENTALL PROCEDURE (N/A) ASCENDING AORTIC ROOT REPLACEMENT (N/A) MAZE (N/A) INTRAOPERATIVE TRANSESOPHAGEAL ECHOCARDIOGRAM (N/A) Subjective: Looks and feels well, good BM yesterday  Objective  Telemetry sinus rhythm  Temp:  [98.5 F (36.9 C)-98.9 F (37.2 C)] 98.9 F (37.2 C) (03/14 0406) Pulse Rate:  [71-79] 72 (03/14 0406) Resp:  [18-20] 19 (03/14 0406) BP: (108-145)/(66-96) 108/71 mmHg (03/14 0406) SpO2:  [85 %-96 %] 92 % (03/14 0408) Weight:  [169 lb 3.2 oz (76.749 kg)] 169 lb 3.2 oz (76.749 kg) (03/14 0406)   Intake/Output Summary (Last 24 hours) at 10/01/13 0841 Last data filed at 10/01/13 0700  Gross per 24 hour  Intake    360 ml  Output   1750 ml  Net  -1390 ml       General appearance: alert, cooperative and no distress Heart: regular rate and rhythm Lungs: dim left>right base, otherwise clear Abdomen: benign Extremities: no sig edema Wound: incis healing well  Lab Results:  Recent Labs  09/28/13 1635  09/30/13 0612 10/01/13 0450  NA  --   < > 136* 138  K  --   < > 4.5 3.7  CL  --   < > 93* 96  CO2  --   < > 28 31  GLUCOSE  --   < > 145* 104*  BUN  --   < > 18 22  CREATININE 0.75  < > 0.89 0.92  CALCIUM  --   < > 8.9 8.7  MG 2.1  --   --   --   < > = values in this interval not displayed.  Recent Labs  09/30/13 0612  AST 46*  ALT 29  ALKPHOS 43  BILITOT 1.1  PROT 5.7*  ALBUMIN 3.0*   No results found for this basename: LIPASE, AMYLASE,  in the last 72 hours  Recent Labs  09/29/13 0335 09/30/13 0612  WBC 13.8* 10.4  HGB 10.5* 10.4*  HCT 31.2* 30.0*  MCV 89.4 89.3  PLT 95* 112*   No results found for this basename: CKTOTAL, CKMB, TROPONINI,  in the last 72 hours No components found with this basename: POCBNP,  No results found for this basename: DDIMER,  in the last 72 hours No results found for  this basename: HGBA1C,  in the last 72 hours No results found for this basename: CHOL, HDL, LDLCALC, TRIG, CHOLHDL,  in the last 72 hours No results found for this basename: TSH, T4TOTAL, FREET3, T3FREE, THYROIDAB,  in the last 72 hours No results found for this basename: VITAMINB12, FOLATE, FERRITIN, TIBC, IRON, RETICCTPCT,  in the last 72 hours  Medications: Scheduled . acetaminophen  1,000 mg Oral 4 times per day  . aspirin EC  81 mg Oral Daily  . bisacodyl  10 mg Oral Daily   Or  . bisacodyl  10 mg Rectal Daily  . chlorhexidine  5 mL Mouth/Throat BID  . docusate sodium  200 mg Oral Daily  . furosemide  40 mg Oral BID  . ketorolac  15 mg Intravenous 4 times per day  . losartan  25 mg Oral BID  . metoprolol succinate  12.5 mg Oral BID  . mupirocin ointment   Nasal BID  . pantoprazole  40 mg Oral Daily  .  potassium chloride  20 mEq Oral Daily  . sodium chloride  3 mL Intravenous Q12H  . spironolactone  12.5 mg Oral Daily  . warfarin  5 mg Oral q1800  . warfarin   Does not apply Once  . Warfarin - Physician Dosing Inpatient   Does not apply q1800     Radiology/Studies:  Dg Chest 2 View  09/30/2013   CLINICAL DATA:  Followup atelectasis.  EXAM: CHEST  2 VIEW  COMPARISON:  DG CHEST 1V PORT dated 09/29/2013; CT ANGIO CHEST W/CM &/OR WO/CM dated 07/28/2013; DG CHEST 1V PORT dated 07/28/2013  FINDINGS: Trachea is midline. Heart is enlarged, stable. Eight intact sternotomy wires. Epicardial pacer wires remain in place. There are small bilateral effusions with bibasilar airspace disease, left greater than right. Right chest tube remains in place. No pneumothorax.  IMPRESSION: Small bilateral effusions and bibasilar airspace disease, left greater than right, stable. Right chest tube in place.   Electronically Signed   By: Lorin Picket M.D.   On: 09/30/2013 08:23   Dg Abd 2 Views  09/30/2013   CLINICAL DATA:  Abdominal distention for 4 days.  EXAM: ABDOMEN - 2 VIEW  COMPARISON:  None.   FINDINGS: Normal bowel gas pattern.  No evidence of obstruction or free air.  There is opacities left lung base may reflect pleural fluid and atelectasis. Infiltrate is possible. Milder right lung base opacity is noted.  Abdominal soft tissues are unremarkable.  IMPRESSION: No obstruction, generalized adynamic ileus or free air. No acute findings in the abdomen pelvis.   Electronically Signed   By: Lajean Manes M.D.   On: 09/30/2013 08:36    INR:1.24 Will add last result for INR, ABG once components are confirmed Will add last 4 CBG results once components are confirmed  Assessment/Plan: S/P Procedure(s) (LRB): BENTALL PROCEDURE (N/A) ASCENDING AORTIC ROOT REPLACEMENT (N/A) MAZE (N/A) INTRAOPERATIVE TRANSESOPHAGEAL ECHOCARDIOGRAM (N/A)  1 excellent progress 2 d/c epw's today 3 cont ac rx- will give 7.5 of coumadin today 4 poss d/c in 24-48 hours      LOS: 4 days    GOLD,WAYNE E 3/14/20158:41 AM  patient examined and medical record reviewed,agree with above note. VAN TRIGT III,PETER 10/01/2013

## 2013-10-01 NOTE — Progress Notes (Signed)
CARDIAC REHAB PHASE I   PRE:  Rate/Rhythm: 75 NSR  BP:  Supine:   Sitting: 110/76  Standing:    SaO2: 92% RA  MODE:  Ambulation: 550 ft   POST:  Rate/Rhythm: 114  BP:  Supine:   Sitting: 118/78  Standing:    SaO2: 84% RA inc to 93% RA  [id number] Patient tolerated ambulation well, gait steady no c/o. SaO2 dropped to 87% during walk, encouraged pursed-lip breathing, SaO2 dropped again to 84% RA while patient was talking but recovered quickly with rest and PLB. SaO2 in room after walk recovered to 98% RA. Encouraged IS use.  Seward Carol, MS, ACSM CES

## 2013-10-02 LAB — PROTIME-INR
INR: 1.39 (ref 0.00–1.49)
Prothrombin Time: 16.7 seconds — ABNORMAL HIGH (ref 11.6–15.2)

## 2013-10-02 MED ORDER — WARFARIN SODIUM 10 MG PO TABS
10.0000 mg | ORAL_TABLET | Freq: Every day | ORAL | Status: DC
  Administered 2013-10-02: 10 mg via ORAL
  Filled 2013-10-02 (×2): qty 1

## 2013-10-02 NOTE — Progress Notes (Signed)
[address]       [place] [address]             [phone]      5 Days Post-Op  Procedure(s) (LRB): BENTALL PROCEDURE (N/A) ASCENDING AORTIC ROOT REPLACEMENT (N/A) MAZE (N/A) INTRAOPERATIVE TRANSESOPHAGEAL ECHOCARDIOGRAM (N/A) Subjective: conts to do very well  Objective  Telemetry sinus with occ pvc  Temp:  [98 F (36.7 C)-98.8 F (37.1 C)] 98.6 F (37 C) (03/15 0508) Pulse Rate:  [70-83] 72 (03/15 0508) Resp:  [18-20] 20 (03/15 0508) BP: (105-134)/(61-86) 134/86 mmHg (03/15 0508) SpO2:  [95 %-98 %] 95 % (03/15 0508) Weight:  [170 lb 1.6 oz (77.157 kg)] 170 lb 1.6 oz (77.157 kg) (03/15 0508)   Intake/Output Summary (Last 24 hours) at 10/02/13 0740 Last data filed at 10/01/13 2134  Gross per 24 hour  Intake    320 ml  Output    900 ml  Net   -580 ml       General appearance: alert, cooperative and no distress Heart: regular rate and rhythm and ejection click present Lungs: mildly dim in right base Abdomen: benign Extremities: minimal LE edema Wound: incis healing well  Lab Results:  Recent Labs  09/30/13 0612 10/01/13 0450  NA 136* 138  K 4.5 3.7  CL 93* 96  CO2 28 31  GLUCOSE 145* 104*  BUN 18 22  CREATININE 0.89 0.92  CALCIUM 8.9 8.7    Recent Labs  09/30/13 0612  AST 46*  ALT 29  ALKPHOS 43  BILITOT 1.1  PROT 5.7*  ALBUMIN 3.0*   No results found for this basename: LIPASE, AMYLASE,  in the last 72 hours  Recent Labs  09/30/13 0612  WBC 10.4  HGB 10.4*  HCT 30.0*  MCV 89.3  PLT 112*   No results found for this basename: CKTOTAL, CKMB, TROPONINI,  in the last 72 hours No components found with this basename: POCBNP,  No results found for this basename: DDIMER,  in the last 72 hours No results found for this basename: HGBA1C,  in the last 72 hours No results found for this basename: CHOL, HDL, LDLCALC, TRIG, CHOLHDL,  in the last 72 hours No results found for this basename: TSH, T4TOTAL, FREET3,  T3FREE, THYROIDAB,  in the last 72 hours No results found for this basename: VITAMINB12, FOLATE, FERRITIN, TIBC, IRON, RETICCTPCT,  in the last 72 hours  Medications: Scheduled . acetaminophen  1,000 mg Oral 4 times per day  . aspirin EC  81 mg Oral Daily  . bisacodyl  10 mg Oral Daily   Or  . bisacodyl  10 mg Rectal Daily  . chlorhexidine  5 mL Mouth/Throat BID  . docusate sodium  200 mg Oral Daily  . furosemide  40 mg Oral BID  . losartan  25 mg Oral BID  . metoprolol succinate  12.5 mg Oral BID  . mupirocin ointment   Nasal BID  . pantoprazole  40 mg Oral Daily  . potassium chloride  20 mEq Oral Daily  . sodium chloride  3 mL Intravenous Q12H  . spironolactone  12.5 mg Oral Daily  . warfarin  7.5 mg Oral q1800  . Warfarin - Physician Dosing Inpatient   Does not apply q1800     Radiology/Studies:  No results found.  INR:1.39 Will add last result for INR, ABG once components are confirmed Will add last 4 CBG results once components are confirmed  Assessment/Plan: S/P Procedure(s) (  LRB): BENTALL PROCEDURE (N/A) ASCENDING AORTIC ROOT REPLACEMENT (N/A) MAZE (N/A) INTRAOPERATIVE TRANSESOPHAGEAL ECHOCARDIOGRAM (N/A)  1 conts to do very well, INR slow to rise, will give 10 mg tonight, may need to consider heparin bridge 2 cont rehab     LOS: 5 days    Annayah Worthley E 3/15/20157:40 AM

## 2013-10-03 LAB — PROTIME-INR
INR: 2.24 — ABNORMAL HIGH (ref 0.00–1.49)
Prothrombin Time: 24.1 seconds — ABNORMAL HIGH (ref 11.6–15.2)

## 2013-10-03 MED ORDER — WARFARIN SODIUM 5 MG PO TABS
5.0000 mg | ORAL_TABLET | Freq: Every day | ORAL | Status: DC
  Filled 2013-10-03: qty 1

## 2013-10-03 MED ORDER — WARFARIN SODIUM 5 MG PO TABS: 5.0000 mg | ORAL_TABLET | Freq: Every day | ORAL | Status: DC

## 2013-10-03 MED ORDER — OXYCODONE HCL 5 MG PO TABS: 5.0000 mg | ORAL_TABLET | ORAL | Status: DC | PRN

## 2013-10-03 MED ORDER — METOPROLOL SUCCINATE ER 25 MG PO TB24: 12.5000 mg | ORAL_TABLET | Freq: Two times a day (BID) | ORAL | Status: DC

## 2013-10-03 MED ORDER — METOPROLOL SUCCINATE ER 25 MG PO TB24: 25.0000 mg | ORAL_TABLET | Freq: Every day | ORAL | Status: DC

## 2013-10-03 MED ORDER — ASPIRIN 81 MG PO TBEC: 81.0000 mg | DELAYED_RELEASE_TABLET | Freq: Every day | ORAL | Status: DC

## 2013-10-03 MED ORDER — WARFARIN SODIUM 7.5 MG PO TABS: 7.5000 mg | ORAL_TABLET | Freq: Every day | ORAL | Status: DC

## 2013-10-03 NOTE — Progress Notes (Addendum)
      [address]       [place] [address]             [phone]        6 Days Post-Op Procedure(s) (LRB): BENTALL PROCEDURE (N/A) ASCENDING AORTIC ROOT REPLACEMENT (N/A) MAZE (N/A) INTRAOPERATIVE TRANSESOPHAGEAL ECHOCARDIOGRAM (N/A)  Subjective: Patient without complaints-really wants to go home.  Objective: Vital signs in last 24 hours: Temp:  [98.3 F (36.8 C)-100 F (37.8 C)] 100 F (37.8 C) (03/16 0410) Pulse Rate:  [77-78] 78 (03/16 0410) Cardiac Rhythm:  [-] Normal sinus rhythm (03/15 2121) Resp:  [18-19] 19 (03/16 0410) BP: (122-137)/(71-89) 137/89 mmHg (03/16 0410) SpO2:  [95 %-100 %] 95 % (03/16 0410) Weight:  [76.703 kg (169 lb 1.6 oz)] 76.703 kg (169 lb 1.6 oz) (03/16 0410)  Pre op weight 75 kg Current Weight  10/03/13 76.703 kg (169 lb 1.6 oz)      Intake/Output from previous day: 03/15 0701 - 03/16 0700 In: 600 [P.O.:600] Out: -    Physical Exam:  Cardiovascular: RRR, sharp valve click;no murmurs, gallops, or rubs. Pulmonary: Clear to auscultation bilaterally; no rales, wheezes, or rhonchi. Abdomen: Soft, non tender, bowel sounds present. Extremities: Trace bilateral lower extremity edema. Wounds: Clean and dry.  No erythema or signs of infection.  Lab Results: CBC:No results found for this basename: WBC, HGB, HCT, PLT,  in the last 72 hours BMET:  Recent Labs  10/01/13 0450  NA 138  K 3.7  CL 96  CO2 31  GLUCOSE 104*  BUN 22  CREATININE 0.92  CALCIUM 8.7    PT/INR:  Lab Results  Component Value Date   INR 2.24* 10/03/2013   INR 1.39 10/02/2013   INR 1.24 10/01/2013   ABG:  INR: Will add last result for INR, ABG once components are confirmed Will add last 4 CBG results once components are confirmed  Assessment/Plan:  1. CV - SR in 70's. On Toprol XL 12.5 bid, Cozaar 25 daily, Spironolactone 12.5 daily,and Coumadin. INR increased from 1.39 to 2.24-has mechanical valve. Will continue with Coumadin 7.5 at  discharge. 2.  Pulmonary - Encourage incentive spirometer 3. Volume Overload - On Lasix 40 bid.  4.  Acute blood loss anemia - Last H and H 10.4 and 30 5. Tmax to 100 earlier this am. Last WBC 10,400. No signs of wound infection and no GU complaints. Last CXR 3/13 showed atelectasis. 6. Remove sutures 7. Will discuss discharge disposition with surgeon as had fever earlier this am  ZIMMERMAN,DONIELLE MPA-C 10/03/2013,7:30 AM  I have seen and examined the patient and agree with the assessment and plan as outlined.  D/C home today on coumadin 5 mg daily.  Stop aspirin now that he's therapeutic on coumadin.  Continue current dosages for metoprolol, Cozaar, lasix and aldactone  OWEN,CLARENCE H 10/03/2013 8:24 AM

## 2013-10-05 ENCOUNTER — Ambulatory Visit (INDEPENDENT_AMBULATORY_CARE_PROVIDER_SITE_OTHER): Payer: BC Managed Care – PPO | Admitting: Pharmacist

## 2013-10-05 DIAGNOSIS — I351 Nonrheumatic aortic (valve) insufficiency: Secondary | ICD-10-CM

## 2013-10-05 DIAGNOSIS — Z9889 Other specified postprocedural states: Secondary | ICD-10-CM

## 2013-10-05 DIAGNOSIS — Z8679 Personal history of other diseases of the circulatory system: Secondary | ICD-10-CM

## 2013-10-05 DIAGNOSIS — I359 Nonrheumatic aortic valve disorder, unspecified: Secondary | ICD-10-CM | POA: Insufficient documentation

## 2013-10-05 LAB — POCT INR: INR: 3.2

## 2013-10-12 ENCOUNTER — Other Ambulatory Visit: Payer: Self-pay | Admitting: *Deleted

## 2013-10-12 DIAGNOSIS — I359 Nonrheumatic aortic valve disorder, unspecified: Secondary | ICD-10-CM

## 2013-10-13 ENCOUNTER — Ambulatory Visit (INDEPENDENT_AMBULATORY_CARE_PROVIDER_SITE_OTHER): Payer: BC Managed Care – PPO | Admitting: *Deleted

## 2013-10-13 DIAGNOSIS — Z9889 Other specified postprocedural states: Secondary | ICD-10-CM

## 2013-10-13 DIAGNOSIS — I359 Nonrheumatic aortic valve disorder, unspecified: Secondary | ICD-10-CM

## 2013-10-13 DIAGNOSIS — Z8679 Personal history of other diseases of the circulatory system: Secondary | ICD-10-CM

## 2013-10-13 DIAGNOSIS — I351 Nonrheumatic aortic (valve) insufficiency: Secondary | ICD-10-CM

## 2013-10-13 LAB — POCT INR: INR: 3.3

## 2013-10-17 ENCOUNTER — Ambulatory Visit
Admission: RE | Admit: 2013-10-17 | Discharge: 2013-10-17 | Disposition: A | Discharge status: Home / Self Care | Payer: BC Managed Care – PPO | Source: Ambulatory Visit | Attending: Thoracic Surgery (Cardiothoracic Vascular Surgery) | Admitting: Thoracic Surgery (Cardiothoracic Vascular Surgery)

## 2013-10-17 ENCOUNTER — Encounter: Payer: Self-pay | Admitting: Thoracic Surgery (Cardiothoracic Vascular Surgery)

## 2013-10-17 ENCOUNTER — Ambulatory Visit (INDEPENDENT_AMBULATORY_CARE_PROVIDER_SITE_OTHER): Payer: Self-pay | Admitting: Thoracic Surgery (Cardiothoracic Vascular Surgery)

## 2013-10-17 VITALS — BP 111/74 | HR 78 | Resp 16 | Ht 68.0 in | Wt 169.0 lb

## 2013-10-17 DIAGNOSIS — Z9889 Other specified postprocedural states: Secondary | ICD-10-CM

## 2013-10-17 DIAGNOSIS — Z09 Encounter for follow-up examination after completed treatment for conditions other than malignant neoplasm: Secondary | ICD-10-CM

## 2013-10-17 DIAGNOSIS — I351 Nonrheumatic aortic (valve) insufficiency: Secondary | ICD-10-CM

## 2013-10-17 DIAGNOSIS — I359 Nonrheumatic aortic valve disorder, unspecified: Secondary | ICD-10-CM

## 2013-10-17 DIAGNOSIS — I712 Thoracic aortic aneurysm, without rupture, unspecified: Secondary | ICD-10-CM

## 2013-10-17 DIAGNOSIS — Q2381 Bicuspid aortic valve: Secondary | ICD-10-CM

## 2013-10-17 DIAGNOSIS — Q231 Congenital insufficiency of aortic valve: Secondary | ICD-10-CM

## 2013-10-17 DIAGNOSIS — Z8679 Personal history of other diseases of the circulatory system: Secondary | ICD-10-CM

## 2013-10-17 DIAGNOSIS — Z954 Presence of other heart-valve replacement: Secondary | ICD-10-CM

## 2013-10-17 MED ORDER — POTASSIUM CHLORIDE CRYS ER 10 MEQ PO TBCR: 10.0000 meq | EXTENDED_RELEASE_TABLET | Freq: Every day | ORAL | Status: DC

## 2013-10-17 MED ORDER — FUROSEMIDE 40 MG PO TABS: 40.0000 mg | ORAL_TABLET | Freq: Every day | ORAL | Status: DC

## 2013-10-17 MED ORDER — AMIODARONE HCL 200 MG PO TABS: 200.0000 mg | ORAL_TABLET | Freq: Two times a day (BID) | ORAL | Status: DC

## 2013-10-17 NOTE — Progress Notes (Signed)
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  His postoperative recovery was uncomplicated. He did have some bradycardia during the first few days after surgery, and for this reason he was not restarted on amiodarone. Since hospital discharge the patient has made excellent progress. He reports improving exercise tolerance with very mild exertional shortness of breath. He sat a minimal amount of pain in his chest and he has not required any sort of pain relievers. He has not had any dizzy spells. His prothrombin time is been checked on several occasions and his Coumadin dose adjusted accordingly. He is therapeutic on Coumadin. His appetite is good. He is sleeping well at night. Overall he feels well.    Current Outpatient Prescriptions  Medication Sig Dispense Refill  . furosemide (LASIX) 40 MG tablet Take 40 mg by mouth 2 (two) times daily.      [name] losartan (COZAAR) 25 MG tablet Take 1 tablet (25 mg total) by mouth 2 (two) times daily.  30 tablet  3  . metoprolol succinate (TOPROL-XL) 25 MG 24 hr tablet Take 0.5 tablets (12.5 mg total) by mouth 2 (two) times daily. Take with or immediately following a meal.  60 tablet  1  . spironolactone (ALDACTONE) 25 MG tablet Take 0.5 tablets (12.5 mg total) by mouth daily.  30 tablet  3  . warfarin (COUMADIN) 5 MG tablet Take 5 mg by mouth daily at 6 PM. Or as directed       No current facility-administered medications for this visit.      Physical Exam:   BP 111/74  Pulse 78  Resp 16  Ht 5\' 8"  (1.727 m)  Wt 169 lb (76.658  kg)  BMI 25.70 kg/m2  SpO2 97%  General:  Well-appearing  Chest:   Clear to auscultation with symmetrical breath sounds  CV:   Irregular rate and rhythm with mechanical heart sounds  Incisions:  Clean and dry and healing nicely, sternum is stable  Abdomen:  Soft and nontender  Extremities:  Warm and well-perfused  Diagnostic Tests:  2 channel telemetry rhythm strip demonstrates atrial fibrillation or atrial flutter  CHEST 2 VIEW  COMPARISON: September 30, 2013  FINDINGS:  There is no edema or consolidation. Heart is mildly enlarged with  normal pulmonary vascularity. No adenopathy. Patient is status post  aortic valve replacement. A clamp on the left atrial appendage is  noted. No pneumothorax. No adenopathy.  IMPRESSION:  Mild cardiac enlargement. No edema or consolidation. No  pneumothorax.  Electronically Signed  By: Lowella Grip M.D.  On: 10/17/2013 15:17    Impression:  The patient appears to be recovering well less than 4 weeks following Bentall aortic root replacement with a mechanical valve conduit and Maze procedure. He has gone back into atrial fibrillation.  Plan:  I've instructed the patient to resume taking amiodarone 200 mg by mouth twice daily.  We will make certain that the Coumadin clinic as notify so that his Coumadin dose may be adjusted down as necessary. I've  also instructed the patient cut his dose of Lasix to 40 mg daily. We have given him a prescription for potassium 10 mEq daily. I've encouraged him to continue to increase his physical activity as tolerated but I have reminded him to refrain from any sort of heavy lifting or strenuous use of his arms or shoulders. I think he can probably start the cardiac rehabilitation program at any point in time to have reminded him to schedule an appointment to see Dr. Aundra Dubin for routine followup. We have talked about a plan for him to return to work and all of his questions have been addressed. He will return for  further followup and rhythm check in 2 months.  Valentina Gu. Roxy Manns, MD 10/17/2013 4:06 PM

## 2013-10-17 NOTE — Patient Instructions (Addendum)
Begin amiodarone and notify the Coumadin Clinic that you are resuming amiodarone  Decrease lasix to one tablet daily  The patient may return to driving an automobile as long as they are no longer requiring oral narcotic pain relievers during the daytime.  It would be wise to start driving only short distances during the daylight and gradually increase from there as they feel comfortable.  The patient should continue to avoid any heavy lifting or strenuous use of arms or shoulders for at least a total of three months from the time of surgery.  The patient is encouraged to enroll and participate in the outpatient cardiac rehab program beginning as soon as practical.

## 2013-10-19 ENCOUNTER — Telehealth: Payer: Self-pay | Admitting: Cardiology

## 2013-10-19 NOTE — Telephone Encounter (Signed)
New message    FYI Pt is on coumadin and has been prescribed amiodarone 200mg  bid---ordered by Dr Darylene Price.

## 2013-10-19 NOTE — Telephone Encounter (Signed)
Patient due for INR tomorrow. He was notified to continue current warfarin dose and recheck INR tomorrow as scheduled.

## 2013-10-20 ENCOUNTER — Ambulatory Visit (INDEPENDENT_AMBULATORY_CARE_PROVIDER_SITE_OTHER): Payer: BC Managed Care – PPO | Admitting: Physician Assistant

## 2013-10-20 ENCOUNTER — Ambulatory Visit (INDEPENDENT_AMBULATORY_CARE_PROVIDER_SITE_OTHER): Payer: BC Managed Care – PPO | Admitting: *Deleted

## 2013-10-20 ENCOUNTER — Encounter: Payer: Self-pay | Admitting: Physician Assistant

## 2013-10-20 VITALS — BP 119/96 | HR 113 | Ht 69.0 in | Wt 159.0 lb

## 2013-10-20 DIAGNOSIS — I509 Heart failure, unspecified: Secondary | ICD-10-CM

## 2013-10-20 DIAGNOSIS — I428 Other cardiomyopathies: Secondary | ICD-10-CM

## 2013-10-20 DIAGNOSIS — I351 Nonrheumatic aortic (valve) insufficiency: Secondary | ICD-10-CM

## 2013-10-20 DIAGNOSIS — Z9889 Other specified postprocedural states: Secondary | ICD-10-CM

## 2013-10-20 DIAGNOSIS — Z8679 Personal history of other diseases of the circulatory system: Secondary | ICD-10-CM

## 2013-10-20 DIAGNOSIS — I359 Nonrheumatic aortic valve disorder, unspecified: Secondary | ICD-10-CM

## 2013-10-20 DIAGNOSIS — I5022 Chronic systolic (congestive) heart failure: Secondary | ICD-10-CM

## 2013-10-20 DIAGNOSIS — I4892 Unspecified atrial flutter: Secondary | ICD-10-CM

## 2013-10-20 LAB — BASIC METABOLIC PANEL
BUN: 22 mg/dL (ref 6–23)
CO2: 29 mEq/L (ref 19–32)
Calcium: 9.3 mg/dL (ref 8.4–10.5)
Chloride: 100 mEq/L (ref 96–112)
Creatinine, Ser: 1 mg/dL (ref 0.4–1.5)
GFR: 85.69 mL/min (ref >60.00)
Glucose, Bld: 97 mg/dL (ref 70–99)
Potassium: 4.9 mEq/L (ref 3.5–5.1)
Sodium: 138 mEq/L (ref 135–145)

## 2013-10-20 LAB — POCT INR: INR: 2.4

## 2013-10-20 MED ORDER — METOPROLOL SUCCINATE ER 25 MG PO TB24: ORAL_TABLET | ORAL | Status: DC

## 2013-10-20 MED ORDER — LOSARTAN POTASSIUM 25 MG PO TABS: 25.0000 mg | ORAL_TABLET | Freq: Two times a day (BID) | ORAL | Status: DC

## 2013-10-20 NOTE — Progress Notes (Signed)
[facility] [place], [place]  [address] Phone: [phone] Fax:  [phone]  Date:  10/20/2013   ID:  [name] Hinton, DOB 08-May-1971, MRN [id number]  PCP:  No PCP Per Patient  Cardiologist:  Dr. [name]     History of Present Illness: [name] Hinton is a 43 y.o. male with a hx of alcohol abuse and former smoker -quit 2011. Hospitalized 07/2013 with increased dyspnea-->ECHO performed and showed EF 25% global HK, mild RVE, severe aortic insufficiency with bicuspid AV. Patient had new-onset acute systolic CHF and atrial flutter. Started on cardizem drip and Xarelto. Initially in A flutter--> became A fib. He had TEE and DC-CV on 08/01/13. Dr Roxy Manns provided consultation due to ascending aortic aneurysm and bicuspid valve with severe AI. He was discharged on acei, bb, spironolactone, and xarelto.    Had cough with lisinopril and switched to losartan.   LHC/RHC was done 2/15 showing no coronary disease and elevated PCWP with pulmonary hypertension.   He was referred for surgery.  He was admitted 3/10-3/16 and underwent Bentall procedure with mechanical aortic valve conduit and MAZE procedure. Postoperative course was fairly uneventful.  He had some bradycardia and was not restarted on amiodarone postoperatively. He was placed on Coumadin.  He saw Dr. Roxy Manns 10/17/13 for f/u and he was back in atrial fibrillation. He resumed his amiodarone at 200 mg twice a day.  Patient states that he feels great.  He feels like his breathing is much improved. He has some residual chest soreness.  He denies orthopnea, PND, edema.  No syncope.  He can sense his HR is fast at times.  He has some mild decreased energy.     Recent Labs: 07/28/2013: HDL Cholesterol 54; LDL (calc) 76; TSH 1.214  08/10/2013: Pro B Natriuretic peptide (BNP) 2321.0*  09/30/2013: ALT 29; Hemoglobin 10.4*  10/01/2013: Creatinine 0.92; Potassium 3.7   Wt Readings from Last 3 Encounters:  10/20/13 159 lb (72.122 kg)    10/17/13 169 lb (76.658 kg)  10/03/13 169 lb 1.6 oz (76.703 kg)     Past Medical History: 1. A fib/Aflutter: Initially flutter during 1/15 admission, degenerated to fibrillation --> S/P TEE DC-CV to NSR.  2. 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 TEE (1/15) with EF 25%, moderate LV dilation, bicuspid aortic valve with severe eccentric AI and no AS, ascending aorta 5.0 cm, moderately decreased RV systolic function. CTA chest (1/15) with 5.1 cm ascending aortic aneurysm.  3. Cardiomyopathy with systolic CHF: As above, TEE with EF 25% and moderately dilated LV, moderately decreased RV systolic function. Tachy-mediated CMP versus ETOH CMP versus long-standing AI versus a combination of the three. LHC/RHC (2/15): no coronary disease, mean RA 5, PA 62/25, mean PCWP 22, CI 1.8, PVR 5.2 WU, LVEDP 35 (severe AI). Echo (2/15): EF 35-40%, moderately dilated LV, bicuspid aortic valve with severe AI.  4. Former smoker quit 2011  5. Alcohol abuse.  6. ACEI cough   Past Medical History  Diagnosis Date  . ETOH abuse   . Systolic CHF     a. Dx 07/2013: EF 25%, felt likely related to combo of EtOH + tachy-mediated + AI. LHC pending.  [name] PAF (paroxysmal atrial fibrillation)     a. Dx 07/2013, s/p TEE/DCCV.  . Paroxysmal atrial flutter     a. Dx 07/2013.  [name] Severe aortic insufficiency     a. Bicuspid aortic valve with severe AI, dilated ascending  aorta dx 07/2013.  [name] Heart murmur   . Dysrhythmia   . 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 S/P Maze operation for atrial fibrillation 09/27/2013    Complete bilateral atrial lesion set using bipolar radiofrequency and cryothermy ablation with clipping of LA appendage  . 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 Non-ischemic cardiomyopathy     Current  Outpatient Prescriptions  Medication Sig Dispense Refill  . amiodarone (PACERONE) 200 MG tablet Take 1 tablet (200 mg total) by mouth 2 (two) times daily.  30 tablet  1  . furosemide (LASIX) 40 MG tablet Take 1 tablet (40 mg total) by mouth daily.  30 tablet  1  . losartan (COZAAR) 25 MG tablet Take 1 tablet (25 mg total) by mouth 2 (two) times daily.  30 tablet  3  . metoprolol succinate (TOPROL-XL) 25 MG 24 hr tablet Take 0.5 tablets (12.5 mg total) by mouth 2 (two) times daily. Take with or immediately following a meal.  60 tablet  1  . potassium chloride (K-DUR,KLOR-CON) 10 MEQ tablet Take 1 tablet (10 mEq total) by mouth daily.  30 tablet  1  . spironolactone (ALDACTONE) 25 MG tablet Take 0.5 tablets (12.5 mg total) by mouth daily.  30 tablet  3  . warfarin (COUMADIN) 5 MG tablet Take 5 mg by mouth daily at 6 PM. Or as directed       No current facility-administered medications for this visit.    Allergies:   Review of patient's allergies indicates no known allergies.   Social History:  The patient  reports that he quit smoking about 4 years ago. He has never used smokeless tobacco. He reports that he does not drink alcohol or use illicit drugs.   Family History:  The patient's family history includes Coronary artery disease in his father; Heart failure in his father.   ROS:  Please see the history of present illness.   No bleeding problems.   All other systems reviewed and negative.   PHYSICAL EXAM: VS:  BP 119/96  Pulse 113  Ht 5\' 9"  (1.753 m)  Wt 159 lb (72.122 kg)  BMI 23.47 kg/m2 Well nourished, well developed, in no acute distress HEENT: normal Neck: no JVD Cardiac:  normal S1, mechanical S2; irregularly irregular rhythm; no murmur Chest:  Median sternotomy incision without erythema or d/c Lungs:  clear to auscultation bilaterally, no wheezing, rhonchi or rales Abd: soft, nontender, no hepatomegaly Ext: no edema Skin: warm and dry Neuro:  CNs 2-12 intact, no focal  abnormalities noted  EKG:  AFlutter with variable AV block, LVH with repol abnormality      ASSESSMENT AND PLAN:  1. Aortic Insufficiency s/p Bentall Procedure with Mechanical Aortic Valve Conduit:  Doing well after recent surgery. He is back in AFlutter but is tolerating it well. He is eager to start cardiac rehab.  Continue coumadin.  Continue SBE prophylaxis.  Arrange f/u echo.  2. Atrial Flutter with RVR:  He is status post recent MAZE procedure. He is now back on amiodarone. Continue current dose. This was restarted several days ago. I will increase his Toprol to 25 mg the morning and 12.5 mg in the evening for better rate control. He already has an appointment next week with Dr. Aundra Dubin. He should keep that appointment. At this point, I doubt he will need cardioversion as he will  likely refer back to NSR on his own. He is currently asymptomatic with atrial flutter. 3. 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 Continue beta blocker, ARB, spironolactone and furosemide. Check basic metabolic panel today. 4. Disposition: Follow up with Dr. Aundra Dubin next week as planned.  Signed, Richardson Dopp, PA-C  10/20/2013 12:07 PM

## 2013-10-20 NOTE — Patient Instructions (Addendum)
Your physician has recommended you make the following change in your medication:  1) INCREASE Toprol XL to 25mg  in the am and 12.5mg  in the pm. An rx has been sent to your pharmacy  A Refill for Losartan has been sent to your pharmacy  Take all other medications prescribed  Lab Today Bmet  Your physician has requested that you have an echocardiogram. Echocardiography is a painless test that uses sound waves to create images of your heart. It provides your doctor with information about the size and shape of your heart and how well your heart's chambers and valves are working. This procedure takes approximately one hour. There are no restrictions for this procedure.( to be scheduled at least 2 weeks out)   Keep your follow up appt with the CHF clinic 10/27/13 @10am   The telephone number for Cardiac Rehab is [phone]. Please call to schedule an appt.

## 2013-10-21 ENCOUNTER — Telehealth: Payer: Self-pay | Admitting: *Deleted

## 2013-10-21 NOTE — Progress Notes (Signed)
Would keep his followup at CHF clinic.

## 2013-10-21 NOTE — Telephone Encounter (Signed)
lmom lab normal pt told to contact office if any questions

## 2013-10-21 NOTE — Telephone Encounter (Signed)
Message copied by Claude Manges on Fri Oct 21, 2013  1:37 PM ------      Message from: [place], [place] T      Created: Thu Oct 20, 2013  4:46 PM       Potassium and kidney function ok      Continue with current treatment plan.      Richardson Dopp, PA-C        10/20/2013 4:46 PM ------

## 2013-10-24 ENCOUNTER — Telehealth: Payer: Self-pay | Admitting: Cardiology

## 2013-10-24 NOTE — Telephone Encounter (Signed)
New message  ° ° °Patient calling for test results.   °

## 2013-10-24 NOTE — Telephone Encounter (Signed)
lmptcb for lab results 

## 2013-10-25 NOTE — Telephone Encounter (Signed)
lmom labs good, no changes to be made

## 2013-10-27 ENCOUNTER — Ambulatory Visit (INDEPENDENT_AMBULATORY_CARE_PROVIDER_SITE_OTHER): Payer: BC Managed Care – PPO

## 2013-10-27 ENCOUNTER — Ambulatory Visit (HOSPITAL_COMMUNITY)
Admission: RE | Admit: 2013-10-27 | Discharge: 2013-10-27 | Disposition: A | Discharge status: Home / Self Care | Payer: BC Managed Care – PPO | Source: Ambulatory Visit | Attending: Internal Medicine | Admitting: Internal Medicine

## 2013-10-27 ENCOUNTER — Encounter (HOSPITAL_COMMUNITY): Payer: Self-pay | Admitting: *Deleted

## 2013-10-27 VITALS — BP 128/66 | HR 95 | Wt 160.8 lb

## 2013-10-27 DIAGNOSIS — I509 Heart failure, unspecified: Secondary | ICD-10-CM | POA: Diagnosis not present

## 2013-10-27 DIAGNOSIS — Z954 Presence of other heart-valve replacement: Secondary | ICD-10-CM

## 2013-10-27 DIAGNOSIS — I351 Nonrheumatic aortic (valve) insufficiency: Secondary | ICD-10-CM

## 2013-10-27 DIAGNOSIS — I5022 Chronic systolic (congestive) heart failure: Secondary | ICD-10-CM | POA: Diagnosis not present

## 2013-10-27 DIAGNOSIS — I359 Nonrheumatic aortic valve disorder, unspecified: Secondary | ICD-10-CM

## 2013-10-27 DIAGNOSIS — G453 Amaurosis fugax: Secondary | ICD-10-CM

## 2013-10-27 DIAGNOSIS — Z9889 Other specified postprocedural states: Secondary | ICD-10-CM

## 2013-10-27 DIAGNOSIS — Z8679 Personal history of other diseases of the circulatory system: Secondary | ICD-10-CM

## 2013-10-27 DIAGNOSIS — I4891 Unspecified atrial fibrillation: Secondary | ICD-10-CM | POA: Diagnosis not present

## 2013-10-27 LAB — HEPATIC FUNCTION PANEL
ALT: 23 U/L (ref 0–53)
AST: 25 U/L (ref 0–37)
Albumin: 4.1 g/dL (ref 3.5–5.2)
Alkaline Phosphatase: 96 U/L (ref 39–117)
Bilirubin, Direct: <0.2 mg/dL (ref 0.0–0.3)
Total Bilirubin: 0.8 mg/dL (ref 0.3–1.2)
Total Protein: 8 g/dL (ref 6.0–8.3)

## 2013-10-27 LAB — TSH: TSH: 2.04 u[IU]/mL (ref 0.350–4.500)

## 2013-10-27 LAB — PROTIME-INR
INR: 2.08 — ABNORMAL HIGH (ref 0.00–1.49)
Prothrombin Time: 22.7 seconds — ABNORMAL HIGH (ref 11.6–15.2)

## 2013-10-27 LAB — POCT INR: INR: 2.6

## 2013-10-27 MED ORDER — FUROSEMIDE 40 MG PO TABS: 20.0000 mg | ORAL_TABLET | Freq: Every day | ORAL | Status: DC

## 2013-10-27 MED ORDER — AMIODARONE HCL 200 MG PO TABS: 400.0000 mg | ORAL_TABLET | Freq: Two times a day (BID) | ORAL | Status: DC

## 2013-10-27 MED ORDER — ASPIRIN EC 81 MG PO TBEC: 81.0000 mg | DELAYED_RELEASE_TABLET | Freq: Every day | ORAL | Status: DC

## 2013-10-27 MED ORDER — METOPROLOL SUCCINATE ER 25 MG PO TB24: 25.0000 mg | ORAL_TABLET | Freq: Two times a day (BID) | ORAL | Status: DC

## 2013-10-27 NOTE — Patient Instructions (Addendum)
Stop Potassium  Start Aspirin 81 mg daily  Increase Amiodarone to 400 mg Twice daily   Increase Metoprolol to 25 mg Twice daily   Decrease Lasix (Furosemide) to 20 mg daily  Labs today  DR MCLEAN WOULD LIKE YOUR INR RANGE TO BE 2.5-3.5  PLEASE GO TO COUMADIN CLINIC ON THUR 2/16 BEFORE YOUR CARDIOVERSION  Labs today  Your physician recommends that you schedule a follow-up appointment in: 1 month  Your physician has requested that you have a TEE/Cardioversion. During a TEE, sound waves are used to create images of your heart. It provides your doctor with information about the size and shape of your heart and how well your heart's chambers and valves are working. In this test, a transducer is attached to the end of a flexible tube that is guided down you throat and into your esophagus (the tube leading from your mouth to your stomach) to get a more detailed image of your heart. Once the TEE has determined that a blood clot is not present, the cardioversion begins. Electrical Cardioversion uses a jolt of electricity to your heart either through paddles or wired patches attached to your chest. This is a controlled, usually prescheduled, procedure. This procedure is done at the hospital and you are not awake during the procedure. You usually go home the day of the procedure. Please see the instruction sheet given to you today for more information.  ON Thursday 4/16 AT 2PM

## 2013-10-28 ENCOUNTER — Other Ambulatory Visit: Payer: Self-pay | Admitting: *Deleted

## 2013-10-29 DIAGNOSIS — G453 Amaurosis fugax: Secondary | ICD-10-CM | POA: Insufficient documentation

## 2013-10-29 NOTE — Progress Notes (Signed)
Patient ID: ISAIR INABINET, male   DOB: [birth year], 43 y.o.   MRN: [id number]  Weight Range  150-153 pounds   Baseline proBNP     HPI:  Mr Studstill is a 43 year old with a history of alcohol abuse and former smoker (quit  2011). Hospitalized 1/15 with increased dyspnea-->ECHO performed and showed EF 25% Global hypokinesis, RV mildly dilated,severe aortic insufficiency.  Patient had new-onset acute systolic CHF and atrial flutter. Started on cardizem drip and Xarelto. Initially in A flutter--> became A fib.   He had TEE and DC-CV on 08/01/13.  Dr Roxy Manns provided consultation due to ascending aortic aneurysm and bicuspid valve with severe AI. He was discharged on ACEI, beta blocker, spironolactone, and xarelto. Discharge weight 150 pounds.    Had cough with lisinopril and switched to losartan.  LHC/RHC was done 2/15 showing no coronary disease and elevated PCWP with pulmonary hypertension.   In 3/15, he had replacement of ascending aortic and aortic valve with Bentall procedure with mechanical aortic valve.  He also had Maze procedure and LAA obliteration.  Post-operatively, he has done well.  He was noted to have gone into atypical atrial flutter by the time of his 3/30 followup with Dr. Roxy Manns.  He is in atypical atrial flutter today.  He has been started on amiodarone.  Symptomatically, he is doing well.  He is walking several times daily without significant exertional dyspnea.  He walks his dog.  2 days ago, he had an episode of transient blurred/darkened vision in his left eye (lasted for a few minutes and completely resolved).    Labs (1/15): K 5.2, creatinine 1.3, AST 40, ALT 58 Labs (2/15): K 4.7, creatinine 1.1 Labs (4/15): K 4.9, creatinine 1.0  ECG: atypical atrial flutter   PMH:  1. A fib/Aflutter: Initially flutter during 1/15 admission, degenerated to fibrillation --> S/P TEE DC-CV to NSR. Atypical atrial flutter recurred post-op in 3/15. 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  2. Bicuspid aortic  valve disorder: Bicuspid aortic valve with ascending aortic aneurysm and aortic insufficiency.  TEE (1/15) with EF 25%, moderate LV dilation, bicuspid aortic valve with severe eccentric AI and no AS, ascending aorta 5.0 cm, moderately decreased RV systolic function.  CTA chest (1/15) with 5.1 cm ascending aortic aneurysm. Patient had Bentall replacement of ascending aorta with mechanical aortic valve and Maze with LAA obliteration in 3/15.  3. Cardiomyopathy with systolic CHF: As above, TEE with EF 25% and moderately dilated LV, moderately decreased RV systolic function.  Tachy-mediated CMP versus ETOH CMP versus long-standing AI versus a combination of the three.  LHC/RHC (2/15): no coronary disease, mean RA 5, PA 62/25, mean PCWP 22, CI 1.8, PVR 5.2 WU, LVEDP 35 (severe AI).  Echo (2/15): EF 35-40%, moderately dilated LV, bicuspid aortic valve with severe AI.  4. Former smoker quit 2011 5. Alcohol abuse.  6. ACEI cough  FH: Father- Heart Failure, Grandfather CAD CABG   SH: Quit smoking 2011. Heavy ETOH until 1/15.  Single, works as Chief Operating Officer.  ROS: All systems negative except as listed in HPI, PMH and Problem List.  Current Outpatient Prescriptions  Medication Sig Dispense Refill  . amiodarone (PACERONE) 200 MG tablet Take 2 tablets (400 mg total) by mouth 2 (two) times daily.  30 tablet  1  . furosemide (LASIX) 40 MG tablet Take 0.5 tablets (20 mg total) by mouth daily.  30 tablet  1  . losartan (COZAAR) 25 MG tablet Take 1 tablet (25 mg total) by  mouth 2 (two) times daily.  60 tablet  5  . metoprolol succinate (TOPROL-XL) 25 MG 24 hr tablet Take 1 tablet (25 mg total) by mouth 2 (two) times daily.  60 tablet  5  . spironolactone (ALDACTONE) 25 MG tablet Take 0.5 tablets (12.5 mg total) by mouth daily.  30 tablet  3  . warfarin (COUMADIN) 5 MG tablet Take 5 mg by mouth daily at 6 PM. Or as directed      . aspirin EC 81 MG tablet Take 1 tablet (81 mg total) by mouth daily.  90 tablet  3   No  current facility-administered medications for this encounter.     PHYSICAL EXAM: Filed Vitals:   10/27/13 1006  BP: 128/66  Pulse: 95  Weight: 160 lb 12 oz (72.916 kg)  SpO2: 100%    General:  Well appearing. No resp difficulty. Mom present  HEENT: normal Neck: supple. JVP flat. Carotids 2+ bilaterally; no bruits. No lymphadenopathy or thryomegaly appreciated. Cor: PMI normal. Irregular rate & rhythm. No rubs, gallops.  1/6 SEM RUSB. Lungs: clear Abdomen: soft, nontender, nondistended. No hepatosplenomegaly. No bruits or masses. Good bowel sounds. Extremities: no cyanosis, clubbing, rash, edema Neuro: alert & orientedx3, cranial nerves grossly intact. Moves all 4 extremities w/o difficulty. Affect pleasant.  ASSESSMENT & PLAN: 1. Chronic systolic CHF: Suspect nonischemic cardiomyopathy.  Tachy-mediated CMP versus ETOH CMP versus long-standing aortic insufficiency or (most likely) a combination of all 3 possible causes.  He is no longer drinking and has had mechanical AVR.  Unfortunately, back in atrial flutter today.  NYHA I-II.   Looks euvolemic on exam. Most recent echo pre-op showed EF 35-40% with moderately dilated LV.  - Continue current Lasix, losartan, and spironolactone.   - Will increase Toprol XL to 25 mg bid.  - Will be getting TEE prior to cardioversion, will reassess EF.  2. Paroxysmal atrial fibrillation/flutter: s/p TEE/DCCV 08/01/13. Had Maze/LAA obliteration with Bentall.  Now with atypical atrial flutter.  I would like to get him out of this rhythm.  INR has been > 2 since 3/16.   - Increase Toprol XL as above for rate control.  - Increase amiodarone to 400 mg bid (hopefully will eventually stop this).  Will get LFTs, TSH today.  - Check INR today.  - I am going to bring him in next week for INR again followed by TEE-guided DCCV.  I am going to do a TEE 1) because I would like to get a baseline evaluation of his valve and EF post-surgery and 2) because of the episode  he had 2 days ago concerning for amaurosis to make sure that no thrombus is visible in LA or on valve.  3. Bicuspid aortic valve disorder with severe AI and severely dilated ascending aorta (5.1 cm on CTA).  Status post mechanical AVR + ascending aorta replacement.  He had an episode about 2 days ago concerning for amaurosis fugax.  Given the presence of a risk factor (atrial flutter), his INR goal should be 2.5-3.5.  I will have coumadin clinic make this change.  He also needs to start on ASA 81 mg daily with the mechanical aortic valve.  4. ETOH abuse: He is no longer drinking.   Elby Showers McLeanMD 10/29/2013

## 2013-11-02 ENCOUNTER — Encounter (HOSPITAL_COMMUNITY): Payer: Self-pay | Admitting: Anesthesiology

## 2013-11-02 ENCOUNTER — Telehealth (HOSPITAL_COMMUNITY): Payer: Self-pay | Admitting: Cardiology

## 2013-11-02 NOTE — Telephone Encounter (Signed)
Pt scheduled for TEE guided DCCV CPT code 93318/92960 icd-9 427.31  with pts current insurance [employer]- no pre cert required per ref call# [phone] Okey Regal.

## 2013-11-03 ENCOUNTER — Encounter: Payer: Self-pay | Admitting: Cardiology

## 2013-11-03 ENCOUNTER — Ambulatory Visit (INDEPENDENT_AMBULATORY_CARE_PROVIDER_SITE_OTHER): Payer: BC Managed Care – PPO | Admitting: *Deleted

## 2013-11-03 ENCOUNTER — Ambulatory Visit (HOSPITAL_COMMUNITY): Admission: RE | Admit: 2013-11-03 | Payer: BC Managed Care – PPO | Source: Ambulatory Visit | Admitting: Cardiology

## 2013-11-03 ENCOUNTER — Ambulatory Visit (HOSPITAL_COMMUNITY): Payer: BC Managed Care – PPO

## 2013-11-03 ENCOUNTER — Encounter (HOSPITAL_COMMUNITY): Admission: RE | Payer: Self-pay | Source: Ambulatory Visit

## 2013-11-03 DIAGNOSIS — I4891 Unspecified atrial fibrillation: Secondary | ICD-10-CM

## 2013-11-03 DIAGNOSIS — Z8679 Personal history of other diseases of the circulatory system: Secondary | ICD-10-CM

## 2013-11-03 DIAGNOSIS — I359 Nonrheumatic aortic valve disorder, unspecified: Secondary | ICD-10-CM

## 2013-11-03 DIAGNOSIS — I351 Nonrheumatic aortic (valve) insufficiency: Secondary | ICD-10-CM

## 2013-11-03 DIAGNOSIS — Z9889 Other specified postprocedural states: Secondary | ICD-10-CM

## 2013-11-03 LAB — POCT INR: INR: 4.2

## 2013-11-03 SURGERY — ECHOCARDIOGRAM, TRANSESOPHAGEAL
Anesthesia: Moderate Sedation

## 2013-11-03 NOTE — Progress Notes (Signed)
Patient seen for EKG per Dr. Aundra Dubin. He is scheduled for cardioversion/tee today at 2pm. Feels he may have converted back to NSR.  EKG reviewed by Dr. Lovena Le (DOD).  Patient does not need cardioversion at this time, he is NSR. Patient aware. Called to scheduling, transferred to endoscopy, procedure cancelled.  Spoke with Linna Hoff at [place] [facility]. Dr. Aundra Dubin informed by Ivin Booty, RN in coumadin clinic.

## 2013-11-06 ENCOUNTER — Other Ambulatory Visit: Payer: Self-pay | Admitting: Thoracic Surgery (Cardiothoracic Vascular Surgery)

## 2013-11-07 ENCOUNTER — Other Ambulatory Visit: Payer: Self-pay

## 2013-11-07 ENCOUNTER — Other Ambulatory Visit: Payer: Self-pay | Admitting: Thoracic Surgery (Cardiothoracic Vascular Surgery)

## 2013-11-07 MED ORDER — AMIODARONE HCL 200 MG PO TABS: 400.0000 mg | ORAL_TABLET | Freq: Two times a day (BID) | ORAL | Status: DC

## 2013-11-10 ENCOUNTER — Ambulatory Visit (HOSPITAL_BASED_OUTPATIENT_CLINIC_OR_DEPARTMENT_OTHER)
Admission: RE | Admit: 2013-11-10 | Discharge: 2013-11-10 | Disposition: A | Discharge status: Home / Self Care | Payer: BC Managed Care – PPO | Source: Ambulatory Visit | Attending: Cardiology | Admitting: Cardiology

## 2013-11-10 ENCOUNTER — Ambulatory Visit (HOSPITAL_COMMUNITY)
Admission: RE | Admit: 2013-11-10 | Discharge: 2013-11-10 | Disposition: A | Discharge status: Home / Self Care | Payer: BC Managed Care – PPO | Source: Ambulatory Visit | Attending: Cardiology | Admitting: Cardiology

## 2013-11-10 ENCOUNTER — Ambulatory Visit (INDEPENDENT_AMBULATORY_CARE_PROVIDER_SITE_OTHER): Payer: BC Managed Care – PPO | Admitting: Pharmacist Clinician (PhC)/ Clinical Pharmacy Specialist

## 2013-11-10 ENCOUNTER — Other Ambulatory Visit: Payer: Self-pay

## 2013-11-10 VITALS — BP 136/80 | HR 63 | Wt 165.2 lb

## 2013-11-10 DIAGNOSIS — Z8679 Personal history of other diseases of the circulatory system: Secondary | ICD-10-CM

## 2013-11-10 DIAGNOSIS — I428 Other cardiomyopathies: Secondary | ICD-10-CM

## 2013-11-10 DIAGNOSIS — I059 Rheumatic mitral valve disease, unspecified: Secondary | ICD-10-CM

## 2013-11-10 DIAGNOSIS — I359 Nonrheumatic aortic valve disorder, unspecified: Secondary | ICD-10-CM

## 2013-11-10 DIAGNOSIS — I509 Heart failure, unspecified: Secondary | ICD-10-CM | POA: Insufficient documentation

## 2013-11-10 DIAGNOSIS — Z9889 Other specified postprocedural states: Secondary | ICD-10-CM

## 2013-11-10 DIAGNOSIS — I4891 Unspecified atrial fibrillation: Secondary | ICD-10-CM | POA: Insufficient documentation

## 2013-11-10 DIAGNOSIS — Z954 Presence of other heart-valve replacement: Secondary | ICD-10-CM | POA: Insufficient documentation

## 2013-11-10 DIAGNOSIS — I5022 Chronic systolic (congestive) heart failure: Secondary | ICD-10-CM

## 2013-11-10 DIAGNOSIS — I712 Thoracic aortic aneurysm, without rupture, unspecified: Secondary | ICD-10-CM | POA: Insufficient documentation

## 2013-11-10 DIAGNOSIS — I351 Nonrheumatic aortic (valve) insufficiency: Secondary | ICD-10-CM

## 2013-11-10 LAB — POCT INR: INR: 4.1

## 2013-11-10 MED ORDER — CARVEDILOL 6.25 MG PO TABS: 6.2500 mg | ORAL_TABLET | Freq: Two times a day (BID) | ORAL | Status: DC

## 2013-11-10 MED ORDER — AMIODARONE HCL 200 MG PO TABS: 200.0000 mg | ORAL_TABLET | Freq: Two times a day (BID) | ORAL | Status: DC

## 2013-11-10 MED ORDER — LOSARTAN POTASSIUM 25 MG PO TABS: ORAL_TABLET | ORAL | Status: DC

## 2013-11-10 NOTE — Patient Instructions (Signed)
Decrease Amiodarone to 200 mg Twice daily   Stop Metoprolol  Start Carvedilol 6.25 mg Twice daily   Increase Losartan to 50 mg (2 tabs) in AM and 25 mg (1 tab) in PM  Labs in 2 weeks  Your physician recommends that you schedule a follow-up appointment in: 1 month

## 2013-11-10 NOTE — Progress Notes (Signed)
  Echocardiogram 2D Echocardiogram has been performed.  Basilia Jumbo 11/10/2013, 9:44 AM

## 2013-11-10 NOTE — Progress Notes (Signed)
Patient ID: [name] Hinton, male   DOB: Apr 19, 1971, 42 y.o.   MRN: [id number]  Weight Range  150-153 pounds   Baseline proBNP     HPI:  [name] Hinton is a 43 year old with a history of alcohol abuse and former smoker (quit  2011). Hospitalized 1/15 with increased dyspnea-->ECHO performed and showed EF 25% Global hypokinesis, RV mildly dilated,severe aortic insufficiency.  Patient had new-onset acute systolic CHF and atrial flutter. Started on cardizem drip and Xarelto. Initially in A flutter--> became A fib.   He had TEE and DC-CV on 08/01/13.  Dr [name] Hinton provided consultation due to ascending aortic aneurysm and bicuspid valve with severe AI. He was discharged on ACEI, beta blocker, spironolactone, and xarelto. Discharge weight 150 pounds.    Had cough with lisinopril and switched to losartan.  LHC/RHC was done 2/15 showing no coronary disease and elevated PCWP with pulmonary hypertension.   In 3/15, he had replacement of ascending aortic and aortic valve with Bentall procedure with mechanical aortic valve.  He also had Maze procedure and LAA obliteration.  Post-operatively, he has done well.  He was noted to have gone into atypical atrial flutter by the time of his 3/30 followup with Dr. Roxy Hinton.  He was started on amiodarone.  We planned a TEE-DCCV last week but he went back into NSR on his own. He remains in NSR today. Symptomatically, he is doing well.  No exertional dyspnea at this point.  He played 18 holes of golf yesterday.  He is working out at [employer].  He is back to work.  He has had no further episodes of transient visual darkening.  Echo today showed normally functioning mechanical aortic valve, but EF remained 30-35%.    Labs (1/15): K 5.2, creatinine 1.3, AST 40, ALT 58 Labs (2/15): K 4.7, creatinine 1.1 Labs (4/15): K 4.9, creatinine 1.0, LFTs normal, TSH normal  ECG: NSR, LVH with repolarization abnormality.   PMH:  1. A fib/Aflutter: Initially flutter during 1/15 admission, degenerated to  fibrillation --> S/P TEE DC-CV to NSR. Atypical atrial flutter recurred post-op in 3/15. 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  2. 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  TEE (1/15) with EF 25%, moderate LV dilation, bicuspid aortic valve with severe eccentric AI and no AS, ascending aorta 5.0 cm, moderately decreased RV systolic function.  CTA chest (1/15) with 5.1 cm ascending aortic aneurysm. Patient had Bentall replacement of ascending aorta with mechanical aortic valve and Maze with LAA obliteration in 3/15.  3. Cardiomyopathy with systolic CHF: As above, TEE with EF 25% and moderately dilated LV, moderately decreased RV systolic function.  Tachy-mediated CMP versus ETOH CMP versus long-standing AI versus a combination of the three.  LHC/RHC (2/15): no coronary disease, mean RA 5, PA 62/25, mean PCWP 22, CI 1.8, PVR 5.2 WU, LVEDP 35 (severe AI).  Echo (2/15): EF 35-40%, moderately dilated LV, bicuspid aortic valve with severe AI.  Echo (4/15) with mildly dilated LV, mild LVH, EF 30-35%, normal RV size and systolic function, mechanical aortic valve appeared to function normally, IVC small.  4. Former smoker quit 2011 5. Alcohol abuse.  6. ACEI cough  FH: Father- Heart Failure, Grandfather CAD CABG   SH: Quit smoking 2011. Heavy ETOH until 1/15.  Single, works as Chief Operating Officer.  ROS: All systems negative except as listed in HPI, PMH and Problem List.  Current Outpatient Prescriptions  Medication Sig Dispense Refill  . amiodarone (  PACERONE) 200 MG tablet Take 1 tablet (200 mg total) by mouth 2 (two) times daily.      [name] aspirin EC 81 MG tablet Take 1 tablet (81 mg total) by mouth daily.  90 tablet  3  . furosemide (LASIX) 40 MG tablet Take 0.5 tablets (20 mg total) by mouth daily.  30 tablet  1  . losartan (COZAAR) 25 MG tablet Take 2 tabs in AM and 1 tab in PM  90 tablet  5  . spironolactone (ALDACTONE) 25 MG tablet Take 0.5 tablets  (12.5 mg total) by mouth daily.  30 tablet  3  . warfarin (COUMADIN) 5 MG tablet Take 5 mg by mouth daily at 6 PM. Or as directed      . carvedilol (COREG) 6.25 MG tablet Take 1 tablet (6.25 mg total) by mouth 2 (two) times daily.  60 tablet  3   No current facility-administered medications for this encounter.     PHYSICAL EXAM: Filed Vitals:   11/10/13 1006  BP: 136/80  Pulse: 63  Weight: 165 lb 4 oz (74.957 kg)  SpO2: 97%    General:  Well appearing. No resp difficulty. HEENT: normal Neck: supple. JVP flat. Carotids 2+ bilaterally; no bruits. No lymphadenopathy or thryomegaly appreciated. Cor: PMI normal. Regular rate & rhythm. No rubs, gallops.  1/6 SEM RUSB. Lungs: clear Abdomen: soft, nontender, nondistended. No hepatosplenomegaly. No bruits or masses. Good bowel sounds. Extremities: no cyanosis, clubbing, rash, edema Neuro: alert & orientedx3, cranial nerves grossly intact. Moves all 4 extremities w/o difficulty. Affect pleasant.  ASSESSMENT & PLAN: 1. Chronic systolic CHF: Nonischemic cardiomyopathy.  Tachy-mediated CMP versus ETOH CMP versus long-standing aortic insufficiency or (most likely) a combination of all 3 possible causes.  He is no longer drinking and has had mechanical AVR.  I reviewed his echo today.  This showed EF 30-35% with mildly dilated LV.    - I am going to have him stop Toprol XL and start Coreg 6.25 mg bid.  - Increase losartan to 50 qam, 25 qpm.  - Continue spironolactone and Lasix at current doses.  - Will increase Toprol XL to 25 mg bid.  - BMET/BNP in 2 wks.  - Titrate up meds and repeat echo in 6 months.  If EF remains low will need to consider ICD.   2. Paroxysmal atrial fibrillation/flutter: s/p TEE/DCCV 08/01/13. Had Maze/LAA obliteration with Bentall.  Recent atypical flutter now back in NSR on amiodarone.   - Decrease amiodarone to 200 mg bid.  Recent LFTs and TSH normal.  Eventually, I would like to get him off amiodarone.  I will probably  keep him on it for now for 2-3 months then stop.  3. Bicuspid aortic valve disorder with severe AI and severely dilated ascending aorta (5.1 cm on CTA).  Status post mechanical AVR + ascending aorta replacement.  The valve looked good on today's echo. No further episodes concerning for amaurosis fugax.  He is now on ASA 81 mg daily and on warfarin with goal INR 2.5-3.5. 4. ETOH abuse: He is no longer drinking.   Elby Showers McLeanMD 11/10/2013

## 2013-11-11 ENCOUNTER — Ambulatory Visit (HOSPITAL_COMMUNITY): Payer: Self-pay

## 2013-11-11 NOTE — Addendum Note (Signed)
Encounter addended by: Evalee Mutton, CCT on: 11/11/2013  9:43 AM<BR>     Documentation filed: Charges VN

## 2013-11-17 ENCOUNTER — Ambulatory Visit (INDEPENDENT_AMBULATORY_CARE_PROVIDER_SITE_OTHER): Payer: BC Managed Care – PPO

## 2013-11-17 DIAGNOSIS — Z9889 Other specified postprocedural states: Secondary | ICD-10-CM

## 2013-11-17 DIAGNOSIS — I351 Nonrheumatic aortic (valve) insufficiency: Secondary | ICD-10-CM

## 2013-11-17 DIAGNOSIS — I359 Nonrheumatic aortic valve disorder, unspecified: Secondary | ICD-10-CM

## 2013-11-17 DIAGNOSIS — Z8679 Personal history of other diseases of the circulatory system: Secondary | ICD-10-CM

## 2013-11-17 LAB — POCT INR: INR: 2.4

## 2013-11-24 ENCOUNTER — Ambulatory Visit (INDEPENDENT_AMBULATORY_CARE_PROVIDER_SITE_OTHER): Payer: BC Managed Care – PPO | Admitting: *Deleted

## 2013-11-24 DIAGNOSIS — Z9889 Other specified postprocedural states: Secondary | ICD-10-CM

## 2013-11-24 DIAGNOSIS — I359 Nonrheumatic aortic valve disorder, unspecified: Secondary | ICD-10-CM

## 2013-11-24 DIAGNOSIS — I351 Nonrheumatic aortic (valve) insufficiency: Secondary | ICD-10-CM

## 2013-11-24 DIAGNOSIS — I509 Heart failure, unspecified: Secondary | ICD-10-CM

## 2013-11-24 DIAGNOSIS — Z8679 Personal history of other diseases of the circulatory system: Secondary | ICD-10-CM

## 2013-11-24 LAB — BASIC METABOLIC PANEL
BUN: 20 mg/dL (ref 6–23)
CO2: 28 mEq/L (ref 19–32)
Calcium: 9.3 mg/dL (ref 8.4–10.5)
Chloride: 101 mEq/L (ref 96–112)
Creatinine, Ser: 1.1 mg/dL (ref 0.4–1.5)
GFR: 81.93 mL/min (ref >60.00)
Glucose, Bld: 102 mg/dL — ABNORMAL HIGH (ref 70–99)
Potassium: 4.6 mEq/L (ref 3.5–5.1)
Sodium: 136 mEq/L (ref 135–145)

## 2013-11-24 LAB — BRAIN NATRIURETIC PEPTIDE: Pro B Natriuretic peptide (BNP): 134 pg/mL — ABNORMAL HIGH (ref 0.0–100.0)

## 2013-11-24 LAB — POCT INR: INR: 2.9

## 2013-11-28 ENCOUNTER — Encounter (HOSPITAL_COMMUNITY): Payer: Self-pay

## 2013-12-05 ENCOUNTER — Encounter: Payer: Self-pay | Admitting: Thoracic Surgery (Cardiothoracic Vascular Surgery)

## 2013-12-05 ENCOUNTER — Ambulatory Visit (INDEPENDENT_AMBULATORY_CARE_PROVIDER_SITE_OTHER): Payer: Self-pay | Admitting: Thoracic Surgery (Cardiothoracic Vascular Surgery)

## 2013-12-05 VITALS — BP 141/90 | HR 65 | Resp 16 | Ht 68.0 in | Wt 160.0 lb

## 2013-12-05 DIAGNOSIS — I359 Nonrheumatic aortic valve disorder, unspecified: Secondary | ICD-10-CM

## 2013-12-05 DIAGNOSIS — Z954 Presence of other heart-valve replacement: Secondary | ICD-10-CM

## 2013-12-05 DIAGNOSIS — Z9889 Other specified postprocedural states: Secondary | ICD-10-CM

## 2013-12-05 DIAGNOSIS — I351 Nonrheumatic aortic (valve) insufficiency: Secondary | ICD-10-CM

## 2013-12-05 DIAGNOSIS — Z8679 Personal history of other diseases of the circulatory system: Secondary | ICD-10-CM

## 2013-12-05 DIAGNOSIS — Q231 Congenital insufficiency of aortic valve: Secondary | ICD-10-CM

## 2013-12-05 DIAGNOSIS — I712 Thoracic aortic aneurysm, without rupture, unspecified: Secondary | ICD-10-CM

## 2013-12-05 DIAGNOSIS — I4891 Unspecified atrial fibrillation: Secondary | ICD-10-CM

## 2013-12-05 DIAGNOSIS — Q2381 Bicuspid aortic valve: Secondary | ICD-10-CM

## 2013-12-05 DIAGNOSIS — Z09 Encounter for follow-up examination after completed treatment for conditions other than malignant neoplasm: Secondary | ICD-10-CM

## 2013-12-05 NOTE — Patient Instructions (Signed)
Patient may resume normal physical activity without any particular limitations at this time.   Endocarditis is a potentially serious infection of heart valves or inside lining of the heart.  It occurs more commonly in patients with diseased heart valves (such as patient's with aortic or mitral valve disease) and in patients who have undergone heart valve repair or replacement.  Certain surgical and dental procedures may put you at risk, such as dental cleaning, other dental procedures, or any surgery involving the respiratory, urinary, gastrointestinal tract, gallbladder or prostate gland.   To minimize your chances for develooping endocarditis, maintain good oral health and seek prompt medical attention for any infections involving the mouth, teeth, gums, skin or urinary tract.  Always notify your doctor or dentist about your underlying heart valve condition before having any invasive procedures. You will need to take antibiotics before certain procedures.

## 2013-12-05 NOTE — Progress Notes (Signed)
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  He was last seen here in our office on 10/17/2013. At that time he was in atrial flutter. He was actually scheduled for cardioversion by Dr. Aundra Dubin last month, but the patient converted to sinus rhythm on his own. He continues to followup with Dr. Aundra Dubin regularly and returns to our office for routine followup today. He reports feeling quite well. He is playing golf a regular basis and his exercise tolerance continues to improve. He feels dramatically better than he did prior to surgery. He no longer has any significant soreness in his chest. He is not having any tachypalpitations or dizzy spells. He has not had any problems managing Coumadin therapy. Overall he feels well and is doing remarkably well.    Current Outpatient Prescriptions  Medication Sig Dispense Refill  . amiodarone (PACERONE) 200 MG tablet Take 200 mg by mouth 2 (two) times daily.      [name] aspirin EC 81 MG tablet Take 1 tablet (81 mg total) by mouth daily.  90 tablet  3  . carvedilol (COREG) 6.25 MG tablet Take 1 tablet (6.25 mg total) by mouth 2 (two) times daily.  60 tablet  3  . furosemide (LASIX) 40 MG tablet Take 0.5 tablets (20 mg total) by mouth daily.  30 tablet  1  . losartan (COZAAR) 25 MG tablet Take 2 tabs in AM and 1 tab in PM  90 tablet  5  . spironolactone (ALDACTONE) 25 MG tablet Take 0.5 tablets (12.5 mg total) by mouth daily.  30 tablet  3  . warfarin (COUMADIN) 5 MG tablet Take 5 mg by mouth daily at 6 PM. Or as directed       No current  facility-administered medications for this visit.      Physical Exam:   BP 141/90  Pulse 65  Resp 16  Ht 5\' 8"  (1.727 m)  Wt 160 lb (72.576 kg)  BMI 24.33 kg/m2  SpO2 98%  General:  Well-appearing  Chest:   Clear to auscultation  CV:   Regular rate and rhythm with mechanical heart sounds  Incisions:  Healing nicely, sternum is stable  Abdomen:  Soft nontender  Extremities:  Warm and well-perfused with no edema  Diagnostic Tests:  2 channel telemetry rhythm strip demonstrates normal sinus rhythm.   Transthoracic Echocardiography  Patient: [name] Hinton, [name] Hinton MR #: [id number] Study Date: 11/10/2013 Gender: M Age: 43 Height: 175.3cm Weight: 72.6kg BSA: 1.38m^2 Pt. Status: Room:  ATTENDING Revonda Standard, [place], [place] SONOGRAPHER Wyatt Mage, RDCS PERFORMING [facility], Outpatient cc:  ------------------------------------------------------------  ------------------------------------------------------------ Indications: Aortic stenosis /insufficiency 424.1.  ------------------------------------------------------------ History: PMH: Atrial fibrillation. Congestive heart failure. Non-ischemic cardiomyopathy. Risk factors: S/p Bentall procedure. S/p Maze procedure. Bicuspid aortic valve. Thoracic aortic aneurysm. Amaurosis fugax. Former tobacco use.  ------------------------------------------------------------ Study Conclusions  - Left ventricle: LVEF is approximately 30 to 35% with inferior/posterior akinesis. The cavity size was severely dilated. Wall thickness was increased in a pattern of mild  LVH. - Mitral valve: Mild regurgitation. - Right ventricle: Systolic function was mildly reduced.  ------------------------------------------------------------ Labs, prior tests, procedures, and surgery: Transthoracic echocardiography (September 01, 2013). EF was 40%.  Valve surgery. Aortic valve replacement with a mechanical  valve. Surgery. Maze procedure. Transthoracic echocardiography. M-mode, complete 2D, spectral Doppler, and color Doppler. Height: Height: 175.3cm. Height: 69in. Weight: Weight: 72.6kg. Weight: 159.7lb. Body mass index: BMI: 23.6kg/m^2. Body surface area: BSA: 1.68m^2. Blood pressure: 119/96. Patient status: Outpatient. Location: Echo laboratory.  ------------------------------------------------------------  ------------------------------------------------------------ Left ventricle: LVEF is approximately 30 to 35% with inferior/posterior akinesis. The cavity size was severely dilated. Wall thickness was increased in a pattern of mild LVH.  ------------------------------------------------------------ Aortic valve: Aortic valve prosthesis opens well Peak and mean gradients through the valve are 18 and 10 mm Hg respectively. Doppler: Trivial regurgitation. Peak velocity ratio of LVOT to aortic valve: 0.41. Mean gradient: 62mm Hg (S). Peak gradient: 54mm Hg (S).  ------------------------------------------------------------ Aorta: Aortic root is mildly dilated at 44 mm.  ------------------------------------------------------------ Mitral valve: Mildly thickened leaflets . Doppler: Mild regurgitation.  ------------------------------------------------------------ Left atrium: The atrium was normal in size.  ------------------------------------------------------------ Right ventricle: The cavity size was normal. Systolic function was mildly reduced.  ------------------------------------------------------------ Pulmonic valve: Structurally normal valve. Cusp separation was normal. Doppler: Transvalvular velocity was within the normal range. Trivial regurgitation.  ------------------------------------------------------------ Tricuspid valve: Structurally normal valve. Leaflet separation was normal. Doppler: Transvalvular velocity was within the normal range. Trivial  regurgitation.  ------------------------------------------------------------ Right atrium: The atrium was normal in size.  ------------------------------------------------------------ Pericardium: There was no pericardial effusion.  ------------------------------------------------------------ Systemic veins: Inferior vena cava: The vessel was normal in size; the respirophasic diameter changes were in the normal range (= 50%); findings are consistent with normal central venous pressure.  ------------------------------------------------------------ Post procedure conclusions Ascending Aorta:  - Aortic root is mildly dilated at 44 mm.  ------------------------------------------------------------  2D measurements Normal Doppler Normal Left ventricle measurements LVID ED, 64 mm 43-52 Left ventricle chord, Ea, lat 9.21 cm/ ------- PLAX ann, tiss s LVID ES, 52.3 mm 23-38 DP chord, E/Ea, lat 7.67 ------- PLAX ann, tiss FS, chord, 18 % >29 DP PLAX Ea, med 4.88 cm/ ------- LVPW, ED 13.8 mm ------ ann, tiss s IVS/LVPW 1.05 <1.3 DP ratio, ED E/Ea, med 14.47 ------- Ventricular septum ann, tiss IVS, ED 14.5 mm ------ DP Aorta LVOT Root diam, 44 mm ------ Peak vel, S 85.1 cm/ ------- ED s Left atrium Aortic valve AP dim 36 mm ------ Peak vel, S 208 cm/ ------- AP dim 1.91 cm/m^2 <2.2 s index Mean vel, S 148 cm/ ------- Vol, S 82.5 ml ------ s Vol index, 43.9 ml/m^2 ------ VTI, S 38.1 cm ------- S Mean 10 mm ------- gradient, S Hg Peak 17 mm ------- gradient, S Hg Peak vel 0.41 ------- ratio, LVOT/AV Mitral valve Peak E vel 70.6 cm/ ------- s Peak A vel 33.4 cm/ ------- s Deceleratio 239 ms 150-230 n time Peak E/A 2.1 ------- ratio Systemic veins Estimated 3 mm ------- CVP Hg Right ventricle Sa vel, lat 10.2 cm/ ------- ann, tiss s DP  ------------------------------------------------------------ Prepared and Electronically Authenticated by  Dorris Carnes 2015-04-23T10:[id number]         Impression:  Patient is doing very well now 3 months status post Bentall aortic root replacement and Maze procedure. He is maintaining sinus rhythm.   Plan:  We have not made any changes to the patient's current medications. It might be reasonable to consider stopping aspirin now that he is therapeutic on Coumadin. The patient  will return in 3 months for routine followup and rhythm check. He has been reminded regarding the need for antibiotic prophylaxis for all dental cleaning and procedures.   Valentina Gu. Roxy Manns, MD 12/05/2013 4:25 PM

## 2013-12-08 ENCOUNTER — Ambulatory Visit (INDEPENDENT_AMBULATORY_CARE_PROVIDER_SITE_OTHER): Payer: BC Managed Care – PPO | Admitting: *Deleted

## 2013-12-08 DIAGNOSIS — Z8679 Personal history of other diseases of the circulatory system: Secondary | ICD-10-CM

## 2013-12-08 DIAGNOSIS — I359 Nonrheumatic aortic valve disorder, unspecified: Secondary | ICD-10-CM

## 2013-12-08 DIAGNOSIS — I351 Nonrheumatic aortic (valve) insufficiency: Secondary | ICD-10-CM

## 2013-12-08 DIAGNOSIS — Z9889 Other specified postprocedural states: Secondary | ICD-10-CM

## 2013-12-08 LAB — POCT INR: INR: 2.5

## 2013-12-15 ENCOUNTER — Ambulatory Visit (HOSPITAL_COMMUNITY)
Admission: RE | Admit: 2013-12-15 | Discharge: 2013-12-15 | Disposition: A | Discharge status: Home / Self Care | Payer: BC Managed Care – PPO | Source: Ambulatory Visit | Attending: Internal Medicine | Admitting: Internal Medicine

## 2013-12-15 ENCOUNTER — Other Ambulatory Visit: Payer: Self-pay | Admitting: Physician Assistant

## 2013-12-15 VITALS — BP 164/98 | HR 57 | Wt 167.5 lb

## 2013-12-15 DIAGNOSIS — I428 Other cardiomyopathies: Secondary | ICD-10-CM | POA: Insufficient documentation

## 2013-12-15 DIAGNOSIS — I712 Thoracic aortic aneurysm, without rupture, unspecified: Secondary | ICD-10-CM

## 2013-12-15 DIAGNOSIS — F1011 Alcohol abuse, in remission: Secondary | ICD-10-CM | POA: Insufficient documentation

## 2013-12-15 DIAGNOSIS — Z7901 Long term (current) use of anticoagulants: Secondary | ICD-10-CM | POA: Insufficient documentation

## 2013-12-15 DIAGNOSIS — I4891 Unspecified atrial fibrillation: Secondary | ICD-10-CM

## 2013-12-15 DIAGNOSIS — I5022 Chronic systolic (congestive) heart failure: Secondary | ICD-10-CM

## 2013-12-15 DIAGNOSIS — I509 Heart failure, unspecified: Secondary | ICD-10-CM

## 2013-12-15 DIAGNOSIS — I359 Nonrheumatic aortic valve disorder, unspecified: Secondary | ICD-10-CM

## 2013-12-15 DIAGNOSIS — I10 Essential (primary) hypertension: Secondary | ICD-10-CM | POA: Insufficient documentation

## 2013-12-15 DIAGNOSIS — Z7982 Long term (current) use of aspirin: Secondary | ICD-10-CM | POA: Insufficient documentation

## 2013-12-15 DIAGNOSIS — Z8679 Personal history of other diseases of the circulatory system: Secondary | ICD-10-CM

## 2013-12-15 DIAGNOSIS — I4892 Unspecified atrial flutter: Secondary | ICD-10-CM | POA: Insufficient documentation

## 2013-12-15 DIAGNOSIS — Z954 Presence of other heart-valve replacement: Secondary | ICD-10-CM

## 2013-12-15 DIAGNOSIS — Z9889 Other specified postprocedural states: Secondary | ICD-10-CM

## 2013-12-15 DIAGNOSIS — Z87891 Personal history of nicotine dependence: Secondary | ICD-10-CM | POA: Insufficient documentation

## 2013-12-15 MED ORDER — WARFARIN SODIUM 5 MG PO TABS: 5.0000 mg | ORAL_TABLET | Freq: Every day | ORAL | Status: DC

## 2013-12-15 MED ORDER — AMIODARONE HCL 200 MG PO TABS: 200.0000 mg | ORAL_TABLET | Freq: Every day | ORAL | Status: DC

## 2013-12-15 MED ORDER — CARVEDILOL 6.25 MG PO TABS: 9.3750 mg | ORAL_TABLET | Freq: Two times a day (BID) | ORAL | Status: DC

## 2013-12-15 MED ORDER — LOSARTAN POTASSIUM 50 MG PO TABS: 50.0000 mg | ORAL_TABLET | Freq: Two times a day (BID) | ORAL | Status: DC

## 2013-12-15 NOTE — Patient Instructions (Signed)
Labs/Bp Check next week  Your physician has requested that you have an echocardiogram. Echocardiography is a painless test that uses sound waves to create images of your heart. It provides your doctor with information about the size and shape of your heart and how well your heart's chambers and valves are working. This procedure takes approximately one hour. There are no restrictions for this procedure. DUE IN [place]  Your physician recommends that you schedule a follow-up appointment in: 1 month  Do the following things EVERYDAY: 1) Weigh yourself in the morning before breakfast. Write it down and keep it in a log. 2) Take your medicines as prescribed 3) Eat low salt foods-Limit salt (sodium) to 2000 mg per day.  4) Stay as active as you can everyday 5) Limit all fluids for the day to less than 2 liters 6)

## 2013-12-15 NOTE — Progress Notes (Signed)
Patient ID: [name] Hinton, male   DOB: 11-Oct-1970, 43 y.o.   MRN: [id number]  Weight Range  150-153 pounds   Baseline proBNP    HPI:  [name] is a 43 year old with a history of alcohol abuse and former smoker (quit  2011). Hospitalized 1/15 with increased dyspnea-->ECHO performed and showed EF 25% Global hypokinesis, RV mildly dilated,severe aortic insufficiency.  Patient had new-onset acute systolic CHF and atrial flutter. Started on cardizem drip and Xarelto. Initially in A flutter--> became A fib.   He had TEE and DC-CV on 08/01/13.  Dr [name] Hinton provided consultation due to ascending aortic aneurysm and bicuspid valve with severe AI. He was discharged on ACEI, beta blocker, spironolactone, and xarelto. Discharge weight 150 pounds.    Had cough with lisinopril and switched to losartan.  LHC/RHC was done 2/15 showing no coronary disease and elevated PCWP with pulmonary hypertension.   In 3/15, he had replacement of ascending aortic and aortic valve with Bentall procedure with mechanical aortic valve.  He also had Maze procedure and LAA obliteration.  He was noted to have gone into atypical atrial flutter by the time of his 3/30 followup with Dr. Roxy Hinton.  He was started on amiodarone and went back into NSR. Symptomatically, he is doing well.  No exertional dyspnea at this point.  He plays golf twice a week.  He is working out at [employer], [employer] and elliptical.  He is back to work.  He has had no further episodes of transient visual darkening.  Last echo in 4/15 showed normally functioning mechanical aortic valve, but EF remained 30-35%.  BP has been running higher recently, 164/98 today.  Of note, he had a large sodium load over the Memorial Day weekend.  Labs (1/15): K 5.2, creatinine 1.3, AST 40, ALT 58 Labs (2/15): K 4.7, creatinine 1.1 Labs (4/15): K 4.9, creatinine 1.0, LFTs normal, TSH normal Labs (5/15): K 4.6, creatinine 1.1, BNP 134  ECG: NSR, LVH with repolarization abnormality.   PMH:  1. A  fib/Aflutter: Initially flutter during 1/15 admission, degenerated to fibrillation --> S/P TEE DC-CV to NSR. Atypical atrial flutter recurred post-op in 3/15. 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  2. 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  TEE (1/15) with EF 25%, moderate LV dilation, bicuspid aortic valve with severe eccentric AI and no AS, ascending aorta 5.0 cm, moderately decreased RV systolic function.  CTA chest (1/15) with 5.1 cm ascending aortic aneurysm. Patient had Bentall replacement of ascending aorta with mechanical aortic valve and Maze with LAA obliteration in 3/15.  3. Cardiomyopathy with systolic CHF: As above, TEE with EF 25% and moderately dilated LV, moderately decreased RV systolic function.  Tachy-mediated CMP versus ETOH CMP versus long-standing AI versus a combination of the three.  LHC/RHC (2/15): no coronary disease, mean RA 5, PA 62/25, mean PCWP 22, CI 1.8, PVR 5.2 WU, LVEDP 35 (severe AI).  Echo (2/15): EF 35-40%, moderately dilated LV, bicuspid aortic valve with severe AI.  Echo (4/15) with mildly dilated LV, mild LVH, EF 30-35%, normal RV size and systolic function, mechanical aortic valve appeared to function normally, IVC small.  4. Former smoker quit 2011 5. Alcohol abuse.  6. ACEI cough  FH: Father- Heart Failure, Grandfather CAD CABG   SH: Quit smoking 2011. Heavy ETOH until 1/15.  Single, works as [name] Hinton.  ROS: All systems negative except as listed in HPI, PMH and Problem List.  Current  Outpatient Prescriptions  Medication Sig Dispense Refill  . amiodarone (PACERONE) 200 MG tablet Take 1 tablet (200 mg total) by mouth daily.  30 tablet  3  . aspirin EC 81 MG tablet Take 1 tablet (81 mg total) by mouth daily.  90 tablet  3  . carvedilol (COREG) 6.25 MG tablet Take 1.5 tablets (9.375 mg total) by mouth 2 (two) times daily.  90 tablet  3  . furosemide (LASIX) 40 MG tablet Take 0.5 tablets (20 mg  total) by mouth daily.  30 tablet  1  . losartan (COZAAR) 50 MG tablet Take 1 tablet (50 mg total) by mouth 2 (two) times daily.  60 tablet  3  . spironolactone (ALDACTONE) 25 MG tablet Take 0.5 tablets (12.5 mg total) by mouth daily.  30 tablet  3  . warfarin (COUMADIN) 5 MG tablet Take 1 tablet (5 mg total) by mouth daily at 6 PM. Or as directed  30 tablet  0   No current facility-administered medications for this encounter.     PHYSICAL EXAM: Filed Vitals:   12/15/13 1021  BP: 164/98  Pulse: 57  Weight: 167 lb 8 oz (75.978 kg)  SpO2: 97%   General:  Well appearing. No resp difficulty. HEENT: normal Neck: supple. JVP flat. Carotids 2+ bilaterally; no bruits. No lymphadenopathy or thryomegaly appreciated. Cor: PMI normal. Regular rate & rhythm. No rubs, gallops. 1/6 SEM RUSB. Lungs: clear Abdomen: soft, nontender, nondistended. No hepatosplenomegaly. No bruits or masses. Good bowel sounds. Extremities: no cyanosis, clubbing, rash, edema Neuro: alert & orientedx3, cranial nerves grossly intact. Moves all 4 extremities w/o difficulty. Affect pleasant.  ASSESSMENT & PLAN: 1. Chronic systolic CHF: Nonischemic cardiomyopathy.  Tachy-mediated CMP versus ETOH CMP versus long-standing aortic insufficiency or (most likely) a combination of all 3 possible causes.  He is no longer drinking and has had mechanical AVR.  Echo in 4/15 showed EF 30-35% with mildly dilated LV.    - Increase Coreg to 9.375 mg bid. - Increase losartan to 50 mg bid. - Continue spironolactone at current dose.  - BMET/BNP in 10 days.  - Titrate up meds and repeat echo in 8/15 months.  If EF remains low will need to consider ICD.   2. Paroxysmal atrial fibrillation/flutter: s/p TEE/DCCV 08/01/13. Had Maze/LAA obliteration with Bentall.  Recent atypical flutter now back in NSR on amiodarone.   - Decrease amiodarone to 200 mg daily.  Recent LFTs and TSH normal (4/15).  Eventually, I would like to get him off amiodarone.  I  will likely stop it in another month.   3. Bicuspid aortic valve disorder with severe AI and severely dilated ascending aorta (5.1 cm on CTA).  Status post mechanical AVR + ascending aorta replacement.  The valve looked good on 4/15 echo. No further episodes concerning for amaurosis fugax.  He is now on ASA 81 mg daily (which will be continued) and on warfarin with goal INR 2.5-3.5. 4. ETOH abuse: He is no longer drinking.  5. HTN: BP running higher.  As above, increasing losartan and Coreg.  Cut back on sodium intake.    Followup in 1 month.   Elby Showers McLeanMD 12/15/2013

## 2013-12-16 NOTE — Addendum Note (Signed)
Encounter addended by: Georga Kaufmann, CCT on: 12/16/2013  9:08 AM<BR>     Documentation filed: Charges VN

## 2013-12-22 ENCOUNTER — Ambulatory Visit (HOSPITAL_COMMUNITY)
Admission: RE | Admit: 2013-12-22 | Discharge: 2013-12-22 | Disposition: A | Discharge status: Home / Self Care | Payer: BC Managed Care – PPO | Source: Ambulatory Visit | Attending: Cardiology | Admitting: Cardiology

## 2013-12-22 ENCOUNTER — Ambulatory Visit (INDEPENDENT_AMBULATORY_CARE_PROVIDER_SITE_OTHER): Payer: BC Managed Care – PPO | Admitting: Pharmacist Clinician (PhC)/ Clinical Pharmacy Specialist

## 2013-12-22 DIAGNOSIS — Z8679 Personal history of other diseases of the circulatory system: Secondary | ICD-10-CM

## 2013-12-22 DIAGNOSIS — I359 Nonrheumatic aortic valve disorder, unspecified: Secondary | ICD-10-CM

## 2013-12-22 DIAGNOSIS — Z9889 Other specified postprocedural states: Secondary | ICD-10-CM

## 2013-12-22 DIAGNOSIS — I351 Nonrheumatic aortic (valve) insufficiency: Secondary | ICD-10-CM

## 2013-12-22 DIAGNOSIS — I5022 Chronic systolic (congestive) heart failure: Secondary | ICD-10-CM

## 2013-12-22 DIAGNOSIS — I509 Heart failure, unspecified: Secondary | ICD-10-CM

## 2013-12-22 LAB — HEPATIC FUNCTION PANEL
ALT: 30 U/L (ref 0–53)
AST: 33 U/L (ref 0–37)
Albumin: 4.3 g/dL (ref 3.5–5.2)
Alkaline Phosphatase: 78 U/L (ref 39–117)
Bilirubin, Direct: <0.2 mg/dL (ref 0.0–0.3)
Total Bilirubin: 0.4 mg/dL (ref 0.3–1.2)
Total Protein: 7.5 g/dL (ref 6.0–8.3)

## 2013-12-22 LAB — BASIC METABOLIC PANEL
BUN: 21 mg/dL (ref 6–23)
CO2: 30 mEq/L (ref 19–32)
Calcium: 9.6 mg/dL (ref 8.4–10.5)
Chloride: 101 mEq/L (ref 96–112)
Creatinine, Ser: 1.05 mg/dL (ref 0.50–1.35)
GFR calc Af Amer: >90 mL/min (ref >90)
GFR calc non Af Amer: 87 mL/min — ABNORMAL LOW (ref >90)
Glucose, Bld: 100 mg/dL — ABNORMAL HIGH (ref 70–99)
Potassium: 4.7 mEq/L (ref 3.7–5.3)
Sodium: 140 mEq/L (ref 137–147)

## 2013-12-22 LAB — POCT INR: INR: 2.3

## 2013-12-22 LAB — TSH: TSH: 2.61 u[IU]/mL (ref 0.350–4.500)

## 2013-12-22 MED ORDER — SPIRONOLACTONE 25 MG PO TABS: 25.0000 mg | ORAL_TABLET | Freq: Every day | ORAL | Status: DC

## 2013-12-22 MED ORDER — CARVEDILOL 12.5 MG PO TABS: 12.5000 mg | ORAL_TABLET | Freq: Two times a day (BID) | ORAL | Status: DC

## 2013-12-22 NOTE — Progress Notes (Signed)
Patient ID: [name] Hinton, male   DOB: 07/29/1970, 43 y.o.   MRN: [id number] Patient here for BP check. BP in both arms 170/111 and recheck 170/108.  Labs checked. Will increase coreg to 12.5 mg twice a day and increase spironolactone to 25 mg daily. Patient reports taking medications this morning. Do not want to titrate too much until we have lab work.   BP at home was 120-140s/80-90s.   Rande Brunt 11:04 AM

## 2013-12-22 NOTE — Patient Instructions (Signed)
Increase your coreg to 12.5 mg twice a day.   Increase your spironolactone to 25 mg daily.

## 2013-12-26 ENCOUNTER — Ambulatory Visit: Payer: Self-pay | Admitting: Thoracic Surgery (Cardiothoracic Vascular Surgery)

## 2013-12-29 ENCOUNTER — Ambulatory Visit (HOSPITAL_COMMUNITY)
Admission: RE | Admit: 2013-12-29 | Discharge: 2013-12-29 | Disposition: A | Discharge status: Home / Self Care | Payer: BC Managed Care – PPO | Source: Ambulatory Visit | Attending: Internal Medicine | Admitting: Internal Medicine

## 2013-12-29 VITALS — BP 122/80 | HR 62 | Wt 164.4 lb

## 2013-12-29 DIAGNOSIS — I509 Heart failure, unspecified: Secondary | ICD-10-CM | POA: Diagnosis not present

## 2013-12-29 DIAGNOSIS — I5022 Chronic systolic (congestive) heart failure: Secondary | ICD-10-CM | POA: Diagnosis not present

## 2013-12-29 LAB — BASIC METABOLIC PANEL
BUN: 19 mg/dL (ref 6–23)
CO2: 23 mEq/L (ref 19–32)
Calcium: 9.1 mg/dL (ref 8.4–10.5)
Chloride: 98 mEq/L (ref 96–112)
Creatinine, Ser: 1.02 mg/dL (ref 0.50–1.35)
GFR calc Af Amer: >90 mL/min (ref >90)
GFR calc non Af Amer: >90 mL/min (ref >90)
Glucose, Bld: 90 mg/dL (ref 70–99)
Potassium: 4.8 mEq/L (ref 3.7–5.3)
Sodium: 139 mEq/L (ref 137–147)

## 2013-12-29 NOTE — Progress Notes (Signed)
Pt in for bmet

## 2013-12-29 NOTE — Addendum Note (Signed)
Encounter addended by: Scarlette Calico, RN on: 12/29/2013 11:05 AM<BR>     Documentation filed: Follow-up Section, LOS Section, Notes Section, Vitals Section

## 2013-12-29 NOTE — Progress Notes (Signed)
Pt also had BP check, BP 122/80 better than last week with increased med will send to  Dr Aundra Dubin for review

## 2014-01-05 ENCOUNTER — Ambulatory Visit (INDEPENDENT_AMBULATORY_CARE_PROVIDER_SITE_OTHER): Payer: BC Managed Care – PPO | Admitting: *Deleted

## 2014-01-05 DIAGNOSIS — I359 Nonrheumatic aortic valve disorder, unspecified: Secondary | ICD-10-CM

## 2014-01-05 DIAGNOSIS — Z9889 Other specified postprocedural states: Secondary | ICD-10-CM

## 2014-01-05 DIAGNOSIS — I351 Nonrheumatic aortic (valve) insufficiency: Secondary | ICD-10-CM

## 2014-01-05 DIAGNOSIS — Z8679 Personal history of other diseases of the circulatory system: Secondary | ICD-10-CM

## 2014-01-05 LAB — POCT INR: INR: 2.4

## 2014-01-16 ENCOUNTER — Other Ambulatory Visit: Payer: Self-pay | Admitting: *Deleted

## 2014-01-16 ENCOUNTER — Ambulatory Visit (HOSPITAL_COMMUNITY)
Admission: RE | Admit: 2014-01-16 | Discharge: 2014-01-16 | Disposition: A | Discharge status: Home / Self Care | Payer: BC Managed Care – PPO | Source: Ambulatory Visit | Attending: Internal Medicine | Admitting: Internal Medicine

## 2014-01-16 VITALS — BP 128/78 | HR 60 | Wt 164.5 lb

## 2014-01-16 DIAGNOSIS — Z8679 Personal history of other diseases of the circulatory system: Secondary | ICD-10-CM

## 2014-01-16 DIAGNOSIS — Z9889 Other specified postprocedural states: Secondary | ICD-10-CM

## 2014-01-16 DIAGNOSIS — Z954 Presence of other heart-valve replacement: Secondary | ICD-10-CM

## 2014-01-16 DIAGNOSIS — I10 Essential (primary) hypertension: Secondary | ICD-10-CM | POA: Insufficient documentation

## 2014-01-16 DIAGNOSIS — I509 Heart failure, unspecified: Secondary | ICD-10-CM

## 2014-01-16 DIAGNOSIS — I5022 Chronic systolic (congestive) heart failure: Secondary | ICD-10-CM

## 2014-01-16 DIAGNOSIS — Z87891 Personal history of nicotine dependence: Secondary | ICD-10-CM | POA: Insufficient documentation

## 2014-01-16 DIAGNOSIS — I428 Other cardiomyopathies: Secondary | ICD-10-CM | POA: Insufficient documentation

## 2014-01-16 MED ORDER — WARFARIN SODIUM 5 MG PO TABS: 5.0000 mg | ORAL_TABLET | ORAL | Status: DC

## 2014-01-16 NOTE — Progress Notes (Addendum)
Patient ID: [name] Hinton, male   DOB: 1971/07/15, 43 y.o.   MRN: [id number]   Weight Range  150-153 pounds   Baseline proBNP    HPI:  [name] Hinton is a 43 year old with a history of alcohol abuse and former smoker (quit  2011). Hospitalized 1/15 with increased dyspnea-->ECHO performed and showed EF 25% Global hypokinesis, RV mildly dilated,severe aortic insufficiency.  Patient had new-onset acute systolic CHF and atrial flutter. Started on cardizem drip and Xarelto. Initially in A flutter--> became A fib.   He had TEE and DC-CV on 08/01/13.  Dr [name] Hinton provided consultation due to ascending aortic aneurysm and bicuspid valve with severe AI. He was discharged on ACEI, beta blocker, spironolactone, and xarelto. Discharge weight 150 pounds.    Had cough with lisinopril and switched to losartan.  LHC/RHC was done 2/15 showing no coronary disease and elevated PCWP with pulmonary hypertension.   In 3/15, he had replacement of ascending aortic and aortic valve with Bentall procedure with mechanical aortic valve.  He also had Maze procedure and LAA obliteration.  He was noted to have gone into atypical atrial flutter by the time of his 3/30 followup with Dr. Roxy Hinton.  He was started on amiodarone and went back into NSR. Feels good. No exertional dyspnea at this point. No edema or palpitations. He plays golf twice a week - walked 18 holes yesterday.   He is back to work.  Last echo in 4/15 showed normally functioning mechanical aortic valve, but EF remained 30-35%. At last visit BP was high carvedilol and losartan increased. Remains on lasix 20 daily amio 200 daily.   Labs (1/15): K 5.2, creatinine 1.3, AST 40, ALT 58 Labs (2/15): K 4.7, creatinine 1.1 Labs (4/15): K 4.9, creatinine 1.0, LFTs normal, TSH normal Labs (5/15): K 4.6, creatinine 1.1, BNP 134 Labs (6/15): K 4.8  creatinine 1.0. INR 2.4 ECG: NSR, LVH with repolarization abnormality.   PMH:  1. A fib/Aflutter: Initially flutter during 1/15 admission,  degenerated to fibrillation --> S/P TEE DC-CV to NSR. Atypical atrial flutter recurred post-op in 3/15. 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  2. 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  TEE (1/15) with EF 25%, moderate LV dilation, bicuspid aortic valve with severe eccentric AI and no AS, ascending aorta 5.0 cm, moderately decreased RV systolic function.  CTA chest (1/15) with 5.1 cm ascending aortic aneurysm. Patient had Bentall replacement of ascending aorta with mechanical aortic valve and Maze with LAA obliteration in 3/15.  3. Cardiomyopathy with systolic CHF: As above, TEE with EF 25% and moderately dilated LV, moderately decreased RV systolic function.  Tachy-mediated CMP versus ETOH CMP versus long-standing AI versus a combination of the three.  LHC/RHC (2/15): no coronary disease, mean RA 5, PA 62/25, mean PCWP 22, CI 1.8, PVR 5.2 WU, LVEDP 35 (severe AI).  Echo (2/15): EF 35-40%, moderately dilated LV, bicuspid aortic valve with severe AI.  Echo (4/15) with mildly dilated LV, mild LVH, EF 30-35%, normal RV size and systolic function, mechanical aortic valve appeared to function normally, IVC small.  4. Former smoker quit 2011 5. Alcohol abuse.  6. ACEI cough  FH: Father- Heart Failure, Grandfather CAD CABG   SH: Quit smoking 2011. Heavy ETOH until 1/15.  Single, works as Chief Operating Officer.  ROS: All systems negative except as listed in HPI, PMH and Problem List.  Current Outpatient Prescriptions  Medication Sig Dispense Refill  . amiodarone (PACERONE) 200  MG tablet Take 1 tablet (200 mg total) by mouth daily.  30 tablet  3  . aspirin EC 81 MG tablet Take 1 tablet (81 mg total) by mouth daily.  90 tablet  3  . carvedilol (COREG) 12.5 MG tablet Take 1 tablet (12.5 mg total) by mouth 2 (two) times daily.  60 tablet  3  . furosemide (LASIX) 40 MG tablet Take 0.5 tablets (20 mg total) by mouth daily.  30 tablet  1  . losartan (COZAAR)  50 MG tablet Take 1 tablet (50 mg total) by mouth 2 (two) times daily.  60 tablet  3  . spironolactone (ALDACTONE) 25 MG tablet Take 1 tablet (25 mg total) by mouth daily.  30 tablet  3  . warfarin (COUMADIN) 5 MG tablet Take 1 tablet (5 mg total) by mouth daily at 6 PM. Or as directed  30 tablet  0   No current facility-administered medications for this encounter.     PHYSICAL EXAM: Filed Vitals:   01/16/14 1019  BP: 128/78  Pulse: 60  Weight: 164 lb 8 oz (74.617 kg)  SpO2: 98%   General:  Well appearing. No resp difficulty. HEENT: normal Neck: supple. JVP flat. Carotids 2+ bilaterally; no bruits. No lymphadenopathy or thryomegaly appreciated. Cor: PMI normal. Regular rate & rhythm. No rubs, gallops. 1/6 SEM RUSB. Lungs: clear Abdomen: soft, nontender, nondistended. No hepatosplenomegaly. No bruits or masses. Good bowel sounds. Extremities: no cyanosis, clubbing, rash, edema Neuro: alert & orientedx3, cranial nerves grossly intact. Moves all 4 extremities w/o difficulty. Affect pleasant.  ASSESSMENT & PLAN: 1. Chronic systolic CHF: Nonischemic cardiomyopathy.  Tachy-mediated CMP versus ETOH CMP versus long-standing aortic insufficiency or (most likely) a combination of all 3 possible causes.  He is no longer drinking and has had mechanical AVR.  Echo in 4/15 showed EF 30-35% with mildly dilated LV.    - continue Coreg to 12.5 mg bid. (may not be able to go higher with HR 60) - continue losartan to 50 mg bid. (will not increase with k 4.8) - Continue spironolactone at current dose.  -  Repeat echo in 8/15.  If EF remains low will need to consider ICD.   2. Paroxysmal atrial fibrillation/flutter: s/p TEE/DCCV 08/01/13. Had Maze/LAA obliteration with Bentall.  Recent atypical flutter now back in NSR on amiodarone.   - Will stop amio today.  3. Bicuspid aortic valve disorder with severe AI and severely dilated ascending aorta (5.1 cm on CTA).  Status post mechanical AVR + ascending aorta  replacement.  The valve looked good on 4/15 echo. No further episodes concerning for amaurosis fugax.  He is now on ASA 81 mg daily (which will be continued) and on warfarin with goal INR 2.5-3.5. - Reinforced need for SBE prophylaxis 4. ETOH abuse: He is no longer drinking.  5. HTN: BP much improved with med changes and cutting back on sodium intake.    Followup in 1 month.   Genevive Bi 01/16/2014

## 2014-01-16 NOTE — Addendum Note (Signed)
Encounter addended by: Jolaine Artist, MD on: 01/16/2014 11:04 AM<BR>     Documentation filed: Notes Section

## 2014-01-16 NOTE — Patient Instructions (Signed)
Stop Amiodarone  Your physician recommends that you schedule a follow-up appointment in: 1 month with echocardiogram

## 2014-01-16 NOTE — Addendum Note (Signed)
Encounter addended by: Scarlette Calico, RN on: 01/16/2014 11:03 AM<BR>     Documentation filed: Patient Instructions Section, Medications

## 2014-01-19 ENCOUNTER — Ambulatory Visit (INDEPENDENT_AMBULATORY_CARE_PROVIDER_SITE_OTHER): Payer: BC Managed Care – PPO | Admitting: Pharmacist Clinician (PhC)/ Clinical Pharmacy Specialist

## 2014-01-19 DIAGNOSIS — I359 Nonrheumatic aortic valve disorder, unspecified: Secondary | ICD-10-CM

## 2014-01-19 DIAGNOSIS — Z8679 Personal history of other diseases of the circulatory system: Secondary | ICD-10-CM

## 2014-01-19 DIAGNOSIS — I351 Nonrheumatic aortic (valve) insufficiency: Secondary | ICD-10-CM

## 2014-01-19 DIAGNOSIS — Z9889 Other specified postprocedural states: Secondary | ICD-10-CM

## 2014-01-19 LAB — POCT INR: INR: 2.5

## 2014-02-02 ENCOUNTER — Ambulatory Visit (INDEPENDENT_AMBULATORY_CARE_PROVIDER_SITE_OTHER): Payer: BC Managed Care – PPO | Admitting: *Deleted

## 2014-02-02 DIAGNOSIS — I351 Nonrheumatic aortic (valve) insufficiency: Secondary | ICD-10-CM

## 2014-02-02 DIAGNOSIS — Z9889 Other specified postprocedural states: Secondary | ICD-10-CM

## 2014-02-02 DIAGNOSIS — I359 Nonrheumatic aortic valve disorder, unspecified: Secondary | ICD-10-CM

## 2014-02-02 DIAGNOSIS — Z8679 Personal history of other diseases of the circulatory system: Secondary | ICD-10-CM

## 2014-02-02 LAB — POCT INR: INR: 3.9

## 2014-02-15 ENCOUNTER — Ambulatory Visit (HOSPITAL_COMMUNITY)
Admission: RE | Admit: 2014-02-15 | Discharge: 2014-02-15 | Disposition: A | Discharge status: Home / Self Care | Payer: BC Managed Care – PPO | Source: Ambulatory Visit | Attending: Cardiology | Admitting: Cardiology

## 2014-02-15 ENCOUNTER — Other Ambulatory Visit (HOSPITAL_COMMUNITY): Payer: Self-pay | Admitting: Cardiology

## 2014-02-15 ENCOUNTER — Ambulatory Visit (HOSPITAL_BASED_OUTPATIENT_CLINIC_OR_DEPARTMENT_OTHER)
Admission: RE | Admit: 2014-02-15 | Discharge: 2014-02-15 | Disposition: A | Discharge status: Home / Self Care | Payer: BC Managed Care – PPO | Source: Ambulatory Visit | Attending: Cardiology | Admitting: Cardiology

## 2014-02-15 ENCOUNTER — Encounter (HOSPITAL_COMMUNITY): Payer: Self-pay

## 2014-02-15 VITALS — BP 128/98 | HR 57 | Wt 165.0 lb

## 2014-02-15 DIAGNOSIS — I517 Cardiomegaly: Secondary | ICD-10-CM

## 2014-02-15 DIAGNOSIS — I5022 Chronic systolic (congestive) heart failure: Secondary | ICD-10-CM | POA: Insufficient documentation

## 2014-02-15 DIAGNOSIS — Z8249 Family history of ischemic heart disease and other diseases of the circulatory system: Secondary | ICD-10-CM | POA: Diagnosis not present

## 2014-02-15 DIAGNOSIS — I4891 Unspecified atrial fibrillation: Secondary | ICD-10-CM

## 2014-02-15 DIAGNOSIS — I509 Heart failure, unspecified: Secondary | ICD-10-CM

## 2014-02-15 DIAGNOSIS — I359 Nonrheumatic aortic valve disorder, unspecified: Secondary | ICD-10-CM

## 2014-02-15 DIAGNOSIS — Z87891 Personal history of nicotine dependence: Secondary | ICD-10-CM | POA: Insufficient documentation

## 2014-02-15 DIAGNOSIS — Z954 Presence of other heart-valve replacement: Secondary | ICD-10-CM | POA: Diagnosis not present

## 2014-02-15 DIAGNOSIS — I480 Paroxysmal atrial fibrillation: Secondary | ICD-10-CM

## 2014-02-15 MED ORDER — LOSARTAN POTASSIUM 50 MG PO TABS: 75.0000 mg | ORAL_TABLET | Freq: Two times a day (BID) | ORAL | Status: DC

## 2014-02-15 NOTE — Patient Instructions (Signed)
INCREASE Losartan to 75 mg twice a day  Labs in two weeks  Your physician recommends that you schedule a follow-up appointment in: 4 months  Do the following things EVERYDAY: 1) Weigh yourself in the morning before breakfast. Write it down and keep it in a log. 2) Take your medicines as prescribed 3) Eat low salt foods-Limit salt (sodium) to 2000 mg per day.  4) Stay as active as you can everyday 5) Limit all fluids for the day to less than 2 liters 6)

## 2014-02-16 NOTE — Progress Notes (Signed)
Patient ID: [name] Hinton, male   DOB: March 06, 1971, 43 y.o.   MRN: [id number]   Weight Range  150-153 pounds   Baseline proBNP    HPI:  Mr Fredell is a 43 year old with a history of alcohol abuse and former smoker (quit  2011). Hospitalized 1/15 with increased dyspnea-->ECHO performed and showed EF 25% Global hypokinesis, RV mildly dilated,severe aortic insufficiency.  Patient had new-onset acute systolic CHF and atrial flutter. Started on cardizem drip and Xarelto. Initially in A flutter--> became A fib.   He had TEE and DC-CV on 08/01/13.  Dr Roxy Manns provided consultation due to ascending aortic aneurysm and bicuspid valve with severe AI. He was discharged on ACEI, beta blocker, spironolactone, and xarelto. Discharge weight 150 pounds.    Had cough with lisinopril and switched to losartan.  LHC/RHC was done 2/15 showing no coronary disease and elevated PCWP with pulmonary hypertension.   In 3/15, he had replacement of ascending aortic and aortic valve with Bentall procedure with mechanical aortic valve.  He also had Maze procedure and LAA obliteration.  He was noted to have gone into atypical atrial flutter by the time of his 3/30 followup with Dr. Roxy Manns.  He was started on amiodarone and went back into NSR. He is back at work.  Echo in 4/15 showed normally functioning mechanical aortic valve, but EF remained 30-35%.   I reviewed today's echo.  EF up to 45% with normal mechanical aortic valve.  He has been doing well in general.  He is exercising on elliptical and riding his bike.  He plays golf three times a week.  No exertional dyspnea.  No tachypalpitations.  Weight is down 2 lbs.  He is in NSR today.  Labs (1/15): K 5.2, creatinine 1.3, AST 40, ALT 58 Labs (2/15): K 4.7, creatinine 1.1 Labs (4/15): K 4.9, creatinine 1.0, LFTs normal, TSH normal Labs (5/15): K 4.6, creatinine 1.1, BNP 134 Labs (6/15): K 4.8  creatinine 1.0. INR 2.4  PMH:  1. A fib/Aflutter: Initially flutter during 1/15  admission, degenerated to fibrillation --> S/P TEE DC-CV to NSR. Atypical atrial flutter recurred post-op in 3/15. 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  2. 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  TEE (1/15) with EF 25%, moderate LV dilation, bicuspid aortic valve with severe eccentric AI and no AS, ascending aorta 5.0 cm, moderately decreased RV systolic function.  CTA chest (1/15) with 5.1 cm ascending aortic aneurysm. Patient had Bentall replacement of ascending aorta with mechanical aortic valve and Maze with LAA obliteration in 3/15.  3. Cardiomyopathy with systolic CHF: As above, TEE with EF 25% and moderately dilated LV, moderately decreased RV systolic function.  Tachy-mediated CMP versus ETOH CMP versus long-standing AI versus a combination of the three.  LHC/RHC (2/15): no coronary disease, mean RA 5, PA 62/25, mean PCWP 22, CI 1.8, PVR 5.2 WU, LVEDP 35 (severe AI).  Echo (2/15): EF 35-40%, moderately dilated LV, bicuspid aortic valve with severe AI.  Echo (4/15) with mildly dilated LV, mild LVH, EF 30-35%, normal RV size and systolic function, mechanical aortic valve appeared to function normally, IVC small.  Echo (7/15) with EF 45%, normal mechanical aortic valve, normal RV size with mildly decreased systolic function.  4. Former smoker quit 2011 5. Alcohol abuse but has cut back considerably.  6. ACEI cough  FH: Father- Heart Failure, Grandfather CAD CABG   SH: Quit smoking 2011. Heavy ETOH until 1/15.  Single, works as Chief Operating Officer.  ROS: All systems negative except as listed in HPI, PMH and Problem List.  Current Outpatient Prescriptions  Medication Sig Dispense Refill  . aspirin EC 81 MG tablet Take 1 tablet (81 mg total) by mouth daily.  90 tablet  3  . carvedilol (COREG) 12.5 MG tablet Take 1 tablet (12.5 mg total) by mouth 2 (two) times daily.  60 tablet  3  . losartan (COZAAR) 50 MG tablet Take 1.5 tablets (75 mg  total) by mouth 2 (two) times daily.  75 tablet  3  . spironolactone (ALDACTONE) 25 MG tablet Take 1 tablet (25 mg total) by mouth daily.  30 tablet  3  . warfarin (COUMADIN) 5 MG tablet Take 1 tablet (5 mg total) by mouth as directed. Or as directed  30 tablet  3   No current facility-administered medications for this encounter.     PHYSICAL EXAM: Filed Vitals:   02/15/14 1054  BP: 128/98  Pulse: 57  Weight: 165 lb (74.844 kg)  SpO2: 100%   General:  Well appearing. No resp difficulty. HEENT: normal Neck: supple. JVP flat. Carotids 2+ bilaterally; no bruits. No lymphadenopathy or thryomegaly appreciated. Cor: PMI normal. Regular rate & rhythm. Mechanical S2.  No rubs, gallops. 1/6 SEM RUSB. Lungs: clear Abdomen: soft, nontender, nondistended. No hepatosplenomegaly. No bruits or masses. Good bowel sounds. Extremities: no cyanosis, clubbing, rash, edema Neuro: alert & orientedx3, cranial nerves grossly intact. Moves all 4 extremities w/o difficulty. Affect pleasant.  ASSESSMENT & PLAN: 1. Chronic systolic CHF: Nonischemic cardiomyopathy.  Tachy-mediated CMP versus ETOH CMP versus long-standing aortic insufficiency or (most likely) a combination of all 3 possible causes.  He is no longer drinking and has had mechanical AVR.  Echo in 4/15 showed EF 30-35% with mildly dilated LV but today's echo showed EF improved to 45%.    - Continue Coreg to 12.5 mg bid. (may not be able to go higher with HR in 50s) - Increase losartan to 75 mg bid, repeat BMET in 2 wks (asked him to stay off high K foods).  - Continue spironolactone at current dose.  2. Paroxysmal atrial fibrillation/flutter: s/p TEE/DCCV 08/01/13. Had Maze/LAA obliteration with Bentall.  Post-op atypical flutter.  Now back in NSR.  Amiodarone was stopped at last appointment.   3. Bicuspid aortic valve disorder with severe AI and severely dilated ascending aorta (5.1 cm on CTA).  Status post mechanical AVR + ascending aorta replacement.   The valve looked good on 7/15 echo. No further episodes concerning for amaurosis fugax.  He is now on ASA 81 mg daily (which will be continued) and on warfarin with goal INR 2.5-3.5. - Reinforced need for SBE prophylaxis 4. ETOH abuse: He is no longer drinking.  5. HTN: BP much improved with med changes and cutting back on sodium intake.    Followup in 4 montha.   Kham Zuckerman Amalia Greenhouse 02/16/2014

## 2014-02-28 ENCOUNTER — Ambulatory Visit (INDEPENDENT_AMBULATORY_CARE_PROVIDER_SITE_OTHER): Payer: BC Managed Care – PPO | Admitting: Pharmacist Clinician (PhC)/ Clinical Pharmacy Specialist

## 2014-02-28 DIAGNOSIS — Z9889 Other specified postprocedural states: Secondary | ICD-10-CM

## 2014-02-28 DIAGNOSIS — I351 Nonrheumatic aortic (valve) insufficiency: Secondary | ICD-10-CM

## 2014-02-28 DIAGNOSIS — Z8679 Personal history of other diseases of the circulatory system: Secondary | ICD-10-CM

## 2014-02-28 DIAGNOSIS — I359 Nonrheumatic aortic valve disorder, unspecified: Secondary | ICD-10-CM

## 2014-02-28 LAB — POCT INR: INR: 2.3

## 2014-03-01 ENCOUNTER — Ambulatory Visit (HOSPITAL_COMMUNITY)
Admission: RE | Admit: 2014-03-01 | Discharge: 2014-03-01 | Disposition: A | Discharge status: Home / Self Care | Payer: BC Managed Care – PPO | Source: Ambulatory Visit | Attending: Internal Medicine | Admitting: Internal Medicine

## 2014-03-01 DIAGNOSIS — I509 Heart failure, unspecified: Secondary | ICD-10-CM | POA: Diagnosis not present

## 2014-03-01 DIAGNOSIS — I5022 Chronic systolic (congestive) heart failure: Secondary | ICD-10-CM | POA: Insufficient documentation

## 2014-03-01 LAB — BASIC METABOLIC PANEL
Anion gap: 12 (ref 5–15)
BUN: 17 mg/dL (ref 6–23)
CO2: 27 mEq/L (ref 19–32)
Calcium: 9.3 mg/dL (ref 8.4–10.5)
Chloride: 104 mEq/L (ref 96–112)
Creatinine, Ser: 1.16 mg/dL (ref 0.50–1.35)
GFR calc Af Amer: 89 mL/min — ABNORMAL LOW (ref >90)
GFR calc non Af Amer: 77 mL/min — ABNORMAL LOW (ref >90)
Glucose, Bld: 113 mg/dL — ABNORMAL HIGH (ref 70–99)
Potassium: 4.7 mEq/L (ref 3.7–5.3)
Sodium: 143 mEq/L (ref 137–147)

## 2014-03-03 ENCOUNTER — Encounter (HOSPITAL_COMMUNITY): Payer: Self-pay | Admitting: Cardiology

## 2014-03-13 ENCOUNTER — Ambulatory Visit (INDEPENDENT_AMBULATORY_CARE_PROVIDER_SITE_OTHER): Payer: BC Managed Care – PPO | Admitting: Thoracic Surgery (Cardiothoracic Vascular Surgery)

## 2014-03-13 ENCOUNTER — Encounter: Payer: Self-pay | Admitting: Thoracic Surgery (Cardiothoracic Vascular Surgery)

## 2014-03-13 VITALS — BP 118/77 | HR 67 | Ht 68.0 in | Wt 165.0 lb

## 2014-03-13 DIAGNOSIS — Z9889 Other specified postprocedural states: Secondary | ICD-10-CM

## 2014-03-13 DIAGNOSIS — Z954 Presence of other heart-valve replacement: Secondary | ICD-10-CM

## 2014-03-13 DIAGNOSIS — I351 Nonrheumatic aortic (valve) insufficiency: Secondary | ICD-10-CM

## 2014-03-13 DIAGNOSIS — I359 Nonrheumatic aortic valve disorder, unspecified: Secondary | ICD-10-CM

## 2014-03-13 DIAGNOSIS — Z8679 Personal history of other diseases of the circulatory system: Secondary | ICD-10-CM

## 2014-03-13 NOTE — Patient Instructions (Signed)

## 2014-03-13 NOTE — Progress Notes (Signed)
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  He was last seen here in our office on 12/05/2013.  Since then he has continued to do exceptionally well. He has been maintaining sinus rhythm. He has not had any problems with Coumadin therapy. He has been followed carefully through the advanced heart failure clinic and he was seen recently by Dr. Aundra Dubin. Followup echocardiogram demonstrates normal functioning mechanical prosthesis in the aortic position with some improvement in left ventricular systolic function. Ejection fraction was estimated 45%.  Clinically the patient reports doing exceptionally well. He feels dramatically better than he did prior to surgery. His exercise tolerance continues to improve. He has no shortness of breath.  He has not had any tachypalpitations.  He is abstaining from alcohol use.    Current Outpatient Prescriptions  Medication Sig Dispense Refill  . aspirin EC 81 MG tablet Take 1 tablet (81 mg total) by mouth daily.  90 tablet  3  . carvedilol (COREG) 12.5 MG tablet Take 1 tablet (12.5 mg total) by mouth 2 (two) times daily.  60 tablet  3  . losartan (COZAAR) 50 MG tablet Take 1.5 tablets (75 mg total) by mouth 2 (two) times daily.  75 tablet  3  . spironolactone (ALDACTONE) 25 MG tablet Take 1 tablet (25 mg total) by mouth daily.  30 tablet  3  . warfarin (COUMADIN) 5 MG tablet Take 1 tablet (5 mg total) by mouth as directed. Or as directed  30 tablet  3   No current facility-administered medications for this visit.       Physical Exam:   BP 118/77  Pulse 67  Ht 5\' 8"  (1.727 m)  Wt 165 lb (74.844 kg)  BMI 25.09 kg/m2  SpO2 98%  General:  Well-appearing  Chest:   Clear to auscultation  CV:   Regular rate and rhythm with mechanical heart sounds  Incisions:  Completely healed, sternum is stable  Abdomen:  Soft nontender  Extremities:  Warm and well-perfused  Diagnostic Tests:  Transthoracic Echocardiography  Patient: [name] Hinton, [name] Hinton MR #: [id number] Study Date: 02/15/2014 Gender: M Age: 43 Height: Weight: BSA: Pt. Status: Room:  ATTENDING [name], M.D. REFERRING [name], M.D. ORDERING Clegg, Amy D REFERRING Clegg, Amy D  cc:  ------------------------------------------------------------------- LV EF: 45%  ------------------------------------------------------------------- Study Conclusions  - Left ventricle: The cavity size was normal. Wall thickness was increased in a pattern of mild LVH. The estimated ejection fraction was 45%. Diffuse hypokinesis. Features are consistent with a pseudonormal left ventricular filling pattern, with concomitant abnormal relaxation and increased filling pressure (grade 2 diastolic dysfunction). - Aortic valve: Mechanical aortic valve. No prosthetic valve stenosis. There was trivial regurgitation. Peak gradient (S): 14 mm Hg. - Aorta: Mildly dilated aortic root. Aortic root dimension: 39 mm (ED). - Mitral valve: There was trivial regurgitation. - Left atrium: The atrium was mildly dilated. - Right ventricle: The cavity size was normal. Systolic function was mildly reduced. - Right atrium: The atrium was  mildly dilated. - Pulmonary arteries: No complete TR doppler jet so unable to estimate PA systolic pressure. - Inferior vena cava: The vessel was normal in size. The respirophasic diameter changes were in the normal range (>= 50%), consistent with normal central venous pressure.  Impressions:  - Normal LV size with mild LV  hypertrophy, EF 45%. Moderate diastolic dysfunction. Normal RV size with mildly decreased systolic function. Normally functioning mechanical aortic valve.  Transthoracic echocardiography. M-mode, complete 2D, spectral Doppler, and color Doppler. Birthdate: Patient birthdate: 1971-03-15. Age: Patient is 43 yr old. Sex: Gender: male. Patient status: Inpatient. Study date: Study date: 02/15/2014. Study time: 10:07 AM.  -------------------------------------------------------------------  ------------------------------------------------------------------- Left ventricle: The cavity size was normal. Wall thickness was increased in a pattern of mild LVH. The estimated ejection fraction was 45%. Diffuse hypokinesis. Features are consistent with a pseudonormal left ventricular filling pattern, with concomitant abnormal relaxation and increased filling pressure (grade 2 diastolic dysfunction).  ------------------------------------------------------------------- Aortic valve: Mechanical aortic valve. No prosthetic valve stenosis. Doppler: There was trivial regurgitation. Mean gradient (S): 8 mm Hg. Peak gradient (S): 14 mm Hg.  ------------------------------------------------------------------- Aorta: Mildly dilated aortic root.  ------------------------------------------------------------------- Mitral valve: Normal thickness leaflets . Doppler: There was no evidence for stenosis. There was trivial regurgitation.  ------------------------------------------------------------------- Left atrium: The atrium was mildly dilated.  ------------------------------------------------------------------- Right ventricle: The cavity size was normal. Systolic function was mildly reduced.  ------------------------------------------------------------------- Pulmonic valve: Structurally normal valve. Cusp separation was normal. Doppler: Transvalvular velocity was within the normal range. There was  trivial regurgitation.  ------------------------------------------------------------------- Tricuspid valve: Doppler: There was no significant regurgitation.  ------------------------------------------------------------------- Pulmonary artery: No complete TR doppler jet so unable to estimate PA systolic pressure.  ------------------------------------------------------------------- Right atrium: The atrium was mildly dilated.  ------------------------------------------------------------------- Pericardium: There was no pericardial effusion.  ------------------------------------------------------------------- Systemic veins: Inferior vena cava: The vessel was normal in size. The respirophasic diameter changes were in the normal range (>= 50%), consistent with normal central venous pressure.  ------------------------------------------------------------------- Post procedure conclusions Ascending Aorta:  - Mildly dilated aortic root.  ------------------------------------------------------------------- Prepared and Electronically Authenticated by  [name], M.D. 2015-07-29T11:16:26  ------------------------------------------------------------------- Measurements  Left ventricle Value 11/10/2013 Reference LV ID, ED, PLAX chordal (H) 54 mm 64 43 - 52 LV ID, ES, PLAX chordal (H) 42 mm 52.3 23 - 38 LV fx shortening, PLAX (L) 22 % 18 >=29 chordal LV PW thickness, ED 13.5 mm 13.8 --------- IVS/LV PW ratio, ED (N) 1.12 1.05 <=1.3 LV ejection fraction, 37 % ---------- --------- 1-p I7O LV end-diastolic 676 ml ---------- --------- volume, 2-p LV end-systolic volume, 720 ml ---------- --------- 2-p LV ejection fraction, 37 % ---------- --------- 2-p Stroke volume, 2-p 58 ml ---------- ---------  Ventricular septum Value 11/10/2013 Reference IVS thickness, ED 15.1 mm 14.5 ---------  Aortic valve Value 11/10/2013 Reference Aortic valve peak 189 cm/s 208  --------- velocity, S Aortic valve VTI, S 36.8 cm 38.1 --------- Aortic mean gradient, S 8 mm Hg 10 --------- Aortic peak gradient, S 14 mm Hg 17 ---------  Aorta Value 11/10/2013 Reference Aortic root ID, ED 39 mm 44 ---------  Left atrium Value 11/10/2013 Reference LA ID, A-P, ES 40 mm 36 ---------  Mitral valve Value 11/10/2013 Reference Mitral E-wave peak 50.9 cm/s 70.6 --------- velocity Mitral deceleration (H) 278 ms 239 150 - 230 time  Legend: (L) and (H) mark values outside specified reference range.  (N) marks values inside specified reference range.    Impression:  Patient is doing exceptionally well nearly 6 months status post aortic  root replacement using a mechanical valve conduit and Maze procedure. He is maintaining sinus rhythm. Followup echocardiogram demonstrates normal functioning mechanical prosthesis in the aortic position with some improvement in left ventricular systolic function.  Plan:  The patient will return in 6 months for routine followup and rhythm check. He will continue to followup closely and advanced heart failure clinic.  I spent in excess of 15 minutes during the conduct of this office consultation and >50% of this time involved direct face-to-face encounter with the patient for counseling and/or coordination of their care.    [name] Gu. Roxy Manns, MD 03/13/2014 2:35 PM

## 2014-03-21 ENCOUNTER — Ambulatory Visit (INDEPENDENT_AMBULATORY_CARE_PROVIDER_SITE_OTHER): Payer: BC Managed Care – PPO | Admitting: Pharmacist

## 2014-03-21 DIAGNOSIS — Z8679 Personal history of other diseases of the circulatory system: Secondary | ICD-10-CM

## 2014-03-21 DIAGNOSIS — Z9889 Other specified postprocedural states: Secondary | ICD-10-CM

## 2014-03-21 DIAGNOSIS — I359 Nonrheumatic aortic valve disorder, unspecified: Secondary | ICD-10-CM

## 2014-03-21 DIAGNOSIS — I351 Nonrheumatic aortic (valve) insufficiency: Secondary | ICD-10-CM

## 2014-03-21 LAB — POCT INR: INR: 3.2

## 2014-04-11 ENCOUNTER — Ambulatory Visit (INDEPENDENT_AMBULATORY_CARE_PROVIDER_SITE_OTHER): Payer: BC Managed Care – PPO | Admitting: Pharmacist

## 2014-04-11 DIAGNOSIS — Z9889 Other specified postprocedural states: Secondary | ICD-10-CM

## 2014-04-11 DIAGNOSIS — I359 Nonrheumatic aortic valve disorder, unspecified: Secondary | ICD-10-CM

## 2014-04-11 DIAGNOSIS — I351 Nonrheumatic aortic (valve) insufficiency: Secondary | ICD-10-CM

## 2014-04-11 DIAGNOSIS — Z8679 Personal history of other diseases of the circulatory system: Secondary | ICD-10-CM

## 2014-04-11 LAB — POCT INR: INR: 1.7

## 2014-04-18 ENCOUNTER — Ambulatory Visit (INDEPENDENT_AMBULATORY_CARE_PROVIDER_SITE_OTHER): Payer: BC Managed Care – PPO | Admitting: Pharmacist Clinician (PhC)/ Clinical Pharmacy Specialist

## 2014-04-18 DIAGNOSIS — Z8679 Personal history of other diseases of the circulatory system: Secondary | ICD-10-CM

## 2014-04-18 DIAGNOSIS — Z9889 Other specified postprocedural states: Secondary | ICD-10-CM

## 2014-04-18 DIAGNOSIS — I359 Nonrheumatic aortic valve disorder, unspecified: Secondary | ICD-10-CM

## 2014-04-18 DIAGNOSIS — I351 Nonrheumatic aortic (valve) insufficiency: Secondary | ICD-10-CM

## 2014-04-18 LAB — POCT INR: INR: 2.5

## 2014-04-30 ENCOUNTER — Other Ambulatory Visit (HOSPITAL_COMMUNITY): Payer: Self-pay | Admitting: Anesthesiology

## 2014-05-01 ENCOUNTER — Encounter: Payer: Self-pay | Admitting: Thoracic Surgery (Cardiothoracic Vascular Surgery)

## 2014-05-02 ENCOUNTER — Ambulatory Visit (INDEPENDENT_AMBULATORY_CARE_PROVIDER_SITE_OTHER): Payer: BC Managed Care – PPO

## 2014-05-02 DIAGNOSIS — I359 Nonrheumatic aortic valve disorder, unspecified: Secondary | ICD-10-CM

## 2014-05-02 DIAGNOSIS — I351 Nonrheumatic aortic (valve) insufficiency: Secondary | ICD-10-CM

## 2014-05-02 DIAGNOSIS — Z8679 Personal history of other diseases of the circulatory system: Secondary | ICD-10-CM

## 2014-05-02 DIAGNOSIS — Z9889 Other specified postprocedural states: Secondary | ICD-10-CM

## 2014-05-02 LAB — POCT INR: INR: 1.9

## 2014-05-08 ENCOUNTER — Other Ambulatory Visit: Payer: Self-pay | Admitting: Cardiology

## 2014-05-16 ENCOUNTER — Ambulatory Visit (INDEPENDENT_AMBULATORY_CARE_PROVIDER_SITE_OTHER): Payer: BC Managed Care – PPO

## 2014-05-16 DIAGNOSIS — Z9889 Other specified postprocedural states: Secondary | ICD-10-CM

## 2014-05-16 DIAGNOSIS — Z8679 Personal history of other diseases of the circulatory system: Secondary | ICD-10-CM

## 2014-05-16 DIAGNOSIS — I359 Nonrheumatic aortic valve disorder, unspecified: Secondary | ICD-10-CM

## 2014-05-16 DIAGNOSIS — I351 Nonrheumatic aortic (valve) insufficiency: Secondary | ICD-10-CM

## 2014-05-16 LAB — POCT INR: INR: 2.5

## 2014-05-30 ENCOUNTER — Other Ambulatory Visit (HOSPITAL_COMMUNITY): Payer: Self-pay | Admitting: Adult Health

## 2014-05-30 DIAGNOSIS — I5022 Chronic systolic (congestive) heart failure: Secondary | ICD-10-CM

## 2014-06-13 ENCOUNTER — Ambulatory Visit (INDEPENDENT_AMBULATORY_CARE_PROVIDER_SITE_OTHER): Payer: BC Managed Care – PPO

## 2014-06-13 DIAGNOSIS — Z8679 Personal history of other diseases of the circulatory system: Secondary | ICD-10-CM

## 2014-06-13 DIAGNOSIS — Z9889 Other specified postprocedural states: Secondary | ICD-10-CM

## 2014-06-13 DIAGNOSIS — I351 Nonrheumatic aortic (valve) insufficiency: Secondary | ICD-10-CM

## 2014-06-13 DIAGNOSIS — I359 Nonrheumatic aortic valve disorder, unspecified: Secondary | ICD-10-CM

## 2014-06-13 LAB — POCT INR: INR: 1.7

## 2014-06-19 ENCOUNTER — Ambulatory Visit (HOSPITAL_COMMUNITY)
Admission: RE | Admit: 2014-06-19 | Discharge: 2014-06-19 | Disposition: A | Discharge status: Home / Self Care | Payer: BC Managed Care – PPO | Source: Ambulatory Visit | Attending: Internal Medicine | Admitting: Internal Medicine

## 2014-06-19 VITALS — BP 128/78 | HR 62 | Wt 169.0 lb

## 2014-06-19 DIAGNOSIS — F1010 Alcohol abuse, uncomplicated: Secondary | ICD-10-CM | POA: Diagnosis not present

## 2014-06-19 DIAGNOSIS — Z87891 Personal history of nicotine dependence: Secondary | ICD-10-CM | POA: Diagnosis not present

## 2014-06-19 DIAGNOSIS — I10 Essential (primary) hypertension: Secondary | ICD-10-CM | POA: Insufficient documentation

## 2014-06-19 DIAGNOSIS — I509 Heart failure, unspecified: Secondary | ICD-10-CM

## 2014-06-19 DIAGNOSIS — I5022 Chronic systolic (congestive) heart failure: Secondary | ICD-10-CM | POA: Insufficient documentation

## 2014-06-19 DIAGNOSIS — Z79899 Other long term (current) drug therapy: Secondary | ICD-10-CM | POA: Insufficient documentation

## 2014-06-19 DIAGNOSIS — Q231 Congenital insufficiency of aortic valve: Secondary | ICD-10-CM | POA: Insufficient documentation

## 2014-06-19 DIAGNOSIS — R002 Palpitations: Secondary | ICD-10-CM | POA: Insufficient documentation

## 2014-06-19 DIAGNOSIS — I480 Paroxysmal atrial fibrillation: Secondary | ICD-10-CM | POA: Diagnosis not present

## 2014-06-19 DIAGNOSIS — Z952 Presence of prosthetic heart valve: Secondary | ICD-10-CM | POA: Insufficient documentation

## 2014-06-19 DIAGNOSIS — I712 Thoracic aortic aneurysm, without rupture: Secondary | ICD-10-CM | POA: Diagnosis not present

## 2014-06-19 DIAGNOSIS — I4892 Unspecified atrial flutter: Secondary | ICD-10-CM | POA: Insufficient documentation

## 2014-06-19 DIAGNOSIS — I358 Other nonrheumatic aortic valve disorders: Secondary | ICD-10-CM | POA: Diagnosis not present

## 2014-06-19 DIAGNOSIS — I429 Cardiomyopathy, unspecified: Secondary | ICD-10-CM | POA: Diagnosis not present

## 2014-06-19 DIAGNOSIS — I4891 Unspecified atrial fibrillation: Secondary | ICD-10-CM

## 2014-06-19 DIAGNOSIS — Z7982 Long term (current) use of aspirin: Secondary | ICD-10-CM | POA: Insufficient documentation

## 2014-06-19 DIAGNOSIS — Z7901 Long term (current) use of anticoagulants: Secondary | ICD-10-CM | POA: Diagnosis not present

## 2014-06-19 DIAGNOSIS — I5021 Acute systolic (congestive) heart failure: Secondary | ICD-10-CM | POA: Diagnosis present

## 2014-06-19 LAB — BASIC METABOLIC PANEL
Anion gap: 11 (ref 5–15)
BUN: 19 mg/dL (ref 6–23)
CO2: 26 mEq/L (ref 19–32)
Calcium: 9.4 mg/dL (ref 8.4–10.5)
Chloride: 101 mEq/L (ref 96–112)
Creatinine, Ser: 0.79 mg/dL (ref 0.50–1.35)
GFR calc Af Amer: >90 mL/min (ref >90)
GFR calc non Af Amer: >90 mL/min (ref >90)
Glucose, Bld: 102 mg/dL — ABNORMAL HIGH (ref 70–99)
Potassium: 4.8 mEq/L (ref 3.7–5.3)
Sodium: 138 mEq/L (ref 137–147)

## 2014-06-19 LAB — MAGNESIUM: Magnesium: 2.1 mg/dL (ref 1.5–2.5)

## 2014-06-19 NOTE — Progress Notes (Signed)
Patient ID: [name] Hinton, male   DOB: 06-Jul-1971, 43 y.o.   MRN: [id number]    HPI: [name] is a 43 year old with a history of alcohol abuse and former smoker (quit  2011). Hospitalized 1/15 with increased dyspnea-->ECHO performed and showed EF 25% Global hypokinesis, RV mildly dilated,severe aortic insufficiency.  Patient had new-onset acute systolic CHF and atrial flutter. Started on cardizem drip and Xarelto. Initially in A flutter--> became A fib.   He had TEE and DC-CV on 08/01/13.  Dr Roxy Manns provided consultation due to ascending aortic aneurysm and bicuspid valve with severe AI. He was discharged on ACEI, beta blocker, spironolactone, and xarelto. Discharge weight 150 pounds.    Had cough with lisinopril and switched to losartan.  LHC/RHC was done 2/15 showing no coronary disease and elevated PCWP with pulmonary hypertension.   In 3/15, he had replacement of ascending aortic and aortic valve with Bentall procedure with mechanical aortic valve.  He also had Maze procedure and LAA obliteration.  He was noted to have gone into atypical atrial flutter by the time of his 3/30 followup with Dr. Roxy Manns.  He was started on amiodarone and went back into NSR. He is back at work.  Echo in 4/15 showed normally functioning mechanical aortic valve, but EF remained 30-35%.   He returns for follow up. Last visit losartan was increased to 75 mg twice a day. Having palpitations on and off. Says he had palpitations for about  1 hour last night. Palpitations intermittent says he has gone for 2 weeks and has not had any.  Does drink caffeine. Overall feels good. Denies SOB/PND/Orthopnea. Exercising 3-4 times a week and strength training 3-4 times a week. Weight at home 165 pounds. Tries to follow low salt diet.   Echo in 4/15 showed normally functioning mechanical aortic valve, but EF remained 30-35%. ECHO 8//2015  EF up to 45% with normal mechanical aortic valve.  Labs (1/15): K 5.2, creatinine 1.3, AST 40, ALT  58 Labs (2/15): K 4.7, creatinine 1.1 Labs (4/15): K 4.9, creatinine 1.0, LFTs normal, TSH normal Labs (5/15): K 4.6, creatinine 1.1, BNP 134 Labs (6/15): K 4.8  creatinine 1.0. INR 2.4 Labs (03/01/14): K 4.7 Creatinine 1.16   PMH:  1. A fib/Aflutter: Initially flutter during 1/15 admission, degenerated to fibrillation --> S/P TEE DC-CV to NSR. Atypical atrial flutter recurred post-op in 3/15. 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  2. 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  TEE (1/15) with EF 25%, moderate LV dilation, bicuspid aortic valve with severe eccentric AI and no AS, ascending aorta 5.0 cm, moderately decreased RV systolic function.  CTA chest (1/15) with 5.1 cm ascending aortic aneurysm. Patient had Bentall replacement of ascending aorta with mechanical aortic valve and Maze with LAA obliteration in 3/15.  3. Cardiomyopathy with systolic CHF: As above, TEE with EF 25% and moderately dilated LV, moderately decreased RV systolic function.  Tachy-mediated CMP versus ETOH CMP versus long-standing AI versus a combination of the three.  LHC/RHC (2/15): no coronary disease, mean RA 5, PA 62/25, mean PCWP 22, CI 1.8, PVR 5.2 WU, LVEDP 35 (severe AI).  Echo (2/15): EF 35-40%, moderately dilated LV, bicuspid aortic valve with severe AI.  Echo (4/15) with mildly dilated LV, mild LVH, EF 30-35%, normal RV size and systolic function, mechanical aortic valve appeared to function normally, IVC small.  Echo (7/15) with EF 45%, normal mechanical aortic valve, normal RV size with  mildly decreased systolic function.  4. Former smoker quit 2011 5. Alcohol abuse but has cut back considerably.  6. ACEI cough  FH: Father- Heart Failure, Grandfather CAD CABG   SH: Quit smoking 2011. Heavy ETOH until 1/15.  Single, works as Chief Operating Officer.  ROS: All systems negative except as listed in HPI, PMH and Problem List.  Current Outpatient Prescriptions   Medication Sig Dispense Refill  . aspirin EC 81 MG tablet Take 1 tablet (81 mg total) by mouth daily. 90 tablet 3  . carvedilol (COREG) 12.5 MG tablet TAKE 1 TABLET 2 TIMES A DAY. 60 tablet 6  . losartan (COZAAR) 50 MG tablet Take 1.5 tablets (75 mg total) by mouth 2 (two) times daily. 75 tablet 3  . spironolactone (ALDACTONE) 25 MG tablet TAKE 1 TABLET BY MOUTH DAILY 30 tablet 6  . warfarin (COUMADIN) 5 MG tablet Take as directed by Coumadin clinic 40 tablet 3   No current facility-administered medications for this encounter.     PHYSICAL EXAM: Filed Vitals:   06/19/14 1111  BP: 128/78  Pulse: 62  Weight: 169 lb (76.658 kg)  SpO2: 100%   General:  Well appearing. No resp difficulty. HEENT: normal Neck: supple. JVP flat. Carotids 2+ bilaterally; no bruits. No lymphadenopathy or thryomegaly appreciated. Cor: PMI normal. Regular rate & rhythm. Mechanical S2.  No rubs, gallops. 1/6 SEM RUSB. Lungs: clear Abdomen: soft, nontender, nondistended. No hepatosplenomegaly. No bruits or masses. Good bowel sounds. Extremities: no cyanosis, clubbing, rash, edema Neuro: alert & orientedx3, cranial nerves grossly intact. Moves all 4 extremities w/o difficulty. Affect pleasant.  EKG : NSR 63 BPM  ASSESSMENT & PLAN: 1. Chronic systolic CHF: Nonischemic cardiomyopathy.  Tachy-mediated CMP versus ETOH CMP versus long-standing aortic insufficiency or (most likely) a combination of all 3 possible causes.  He is no longer drinking and has had mechanical AVR.  Echo  01/2014  45%.    - Continue Coreg to 12.5 mg bid. (may not be able to go higher with HR in 50s) -Continue losartan to 75 mg bid. Intolerant ace due to cough.  - Continue spironolactone 25 mg daily.   Check BMET  2. Paroxysmal atrial fibrillation/flutter: s/p TEE/DCCV 08/01/13. Had Maze/LAA obliteration with Bentall.  Post-op atypical flutter.  Remaining in SR. No longer on  Amiodarone was stopped at last appointment.   3. Bicuspid aortic  valve disorder with severe AI and severely dilated ascending aorta (5.1 cm on CTA).  Status post mechanical AVR + ascending aorta replacement.  The valve looked good on 7/15 echo. No further episodes concerning for amaurosis fugax.  He is now on ASA 81 mg daily (which will be continued) and on warfarin with goal INR 2.5-3.5. INR managed Coumadin Clinic.  - Reinforced need for SBE prophylaxis 4. ETOH abuse: Has about 5 drinks a week.   5. HTN: Stable. Continue current regimen. 6. Palpitations- Continue current dose of coreg. Place 48 hour monitor  Check  BMET and magnesium level.    Follow up 6 months.   CLEGG,AMY NP-C  06/19/2014  Patient seen and examined with Darrick Grinder, NP. We discussed all aspects of the encounter. I agree with the assessment and plan as stated above.   Doing very well. NYHA I. EF recovering. Continue current regimen. Sounds like he is having PVCs or PACs. Will place Holter. Advised to limit caffeine.   Matalynn Graff,MD 2:05 PM

## 2014-06-19 NOTE — Patient Instructions (Signed)
Labs today  Your physician has recommended that you wear a holter monitor. Holter monitors are medical devices that record the heart's electrical activity. Doctors most often use these monitors to diagnose arrhythmias. Arrhythmias are problems with the speed or rhythm of the heartbeat. The monitor is a small, portable device. You can wear one while you do your normal daily activities. This is usually used to diagnose what is causing palpitations/syncope (passing out).  Your physician recommends that you schedule a follow-up appointment in: 4-6 months with MD  Do the following things EVERYDAY: 1) Weigh yourself in the morning before breakfast. Write it down and keep it in a log. 2) Take your medicines as prescribed 3) Eat low salt foods-Limit salt (sodium) to 2000 mg per day.  4) Stay as active as you can everyday 5) Limit all fluids for the day to less than 2 liters 6)

## 2014-06-21 NOTE — Addendum Note (Signed)
Encounter addended by: Scarlette Calico, RN on: 06/21/2014  3:32 PM<BR>     Documentation filed: Visit Diagnoses, Dx Association, Orders

## 2014-06-22 ENCOUNTER — Encounter: Payer: Self-pay | Admitting: *Deleted

## 2014-06-22 ENCOUNTER — Encounter (INDEPENDENT_AMBULATORY_CARE_PROVIDER_SITE_OTHER): Payer: BC Managed Care – PPO

## 2014-06-22 DIAGNOSIS — R002 Palpitations: Secondary | ICD-10-CM

## 2014-06-22 DIAGNOSIS — I4891 Unspecified atrial fibrillation: Secondary | ICD-10-CM

## 2014-06-22 NOTE — Progress Notes (Signed)
Patient ID: [name] Hinton, male   DOB: [birth year], 43 y.o.   MRN: [id number] [facility] 48 hour holter monitor applied to patient.

## 2014-06-27 ENCOUNTER — Ambulatory Visit (INDEPENDENT_AMBULATORY_CARE_PROVIDER_SITE_OTHER): Payer: BC Managed Care – PPO | Admitting: Pharmacist

## 2014-06-27 DIAGNOSIS — I351 Nonrheumatic aortic (valve) insufficiency: Secondary | ICD-10-CM

## 2014-06-27 DIAGNOSIS — Z8679 Personal history of other diseases of the circulatory system: Secondary | ICD-10-CM

## 2014-06-27 DIAGNOSIS — I359 Nonrheumatic aortic valve disorder, unspecified: Secondary | ICD-10-CM

## 2014-06-27 DIAGNOSIS — Z9889 Other specified postprocedural states: Secondary | ICD-10-CM

## 2014-06-27 LAB — POCT INR: INR: 2.2

## 2014-06-28 ENCOUNTER — Other Ambulatory Visit (HOSPITAL_COMMUNITY): Payer: Self-pay | Admitting: Cardiology

## 2014-06-29 ENCOUNTER — Encounter (HOSPITAL_COMMUNITY): Payer: Self-pay | Admitting: Cardiology

## 2014-07-11 ENCOUNTER — Ambulatory Visit (INDEPENDENT_AMBULATORY_CARE_PROVIDER_SITE_OTHER): Payer: BC Managed Care – PPO | Admitting: Pharmacist

## 2014-07-11 DIAGNOSIS — Z8679 Personal history of other diseases of the circulatory system: Secondary | ICD-10-CM

## 2014-07-11 DIAGNOSIS — Z9889 Other specified postprocedural states: Secondary | ICD-10-CM

## 2014-07-11 DIAGNOSIS — I351 Nonrheumatic aortic (valve) insufficiency: Secondary | ICD-10-CM

## 2014-07-11 DIAGNOSIS — I359 Nonrheumatic aortic valve disorder, unspecified: Secondary | ICD-10-CM

## 2014-07-11 LAB — POCT INR: INR: 3.2

## 2014-07-12 ENCOUNTER — Telehealth (HOSPITAL_COMMUNITY): Payer: Self-pay | Admitting: *Deleted

## 2014-07-12 NOTE — Telephone Encounter (Signed)
Called pt with monitor results (SR with PVCs and PACs, no a-fib per Dr Haroldine Laws) pt is aware and agreeable, he states he feels the palpitations but has eliminated caffeine and started working out and they seem to have gotten better

## 2014-07-26 ENCOUNTER — Ambulatory Visit (INDEPENDENT_AMBULATORY_CARE_PROVIDER_SITE_OTHER): Payer: 59 | Admitting: *Deleted

## 2014-07-26 DIAGNOSIS — Z9889 Other specified postprocedural states: Secondary | ICD-10-CM

## 2014-07-26 DIAGNOSIS — I359 Nonrheumatic aortic valve disorder, unspecified: Secondary | ICD-10-CM

## 2014-07-26 DIAGNOSIS — I351 Nonrheumatic aortic (valve) insufficiency: Secondary | ICD-10-CM

## 2014-07-26 DIAGNOSIS — Z8679 Personal history of other diseases of the circulatory system: Secondary | ICD-10-CM

## 2014-07-26 LAB — POCT INR: INR: 3.3

## 2014-08-23 ENCOUNTER — Ambulatory Visit (INDEPENDENT_AMBULATORY_CARE_PROVIDER_SITE_OTHER): Payer: 59 | Admitting: Pharmacist

## 2014-08-23 DIAGNOSIS — Z9889 Other specified postprocedural states: Secondary | ICD-10-CM

## 2014-08-23 DIAGNOSIS — Z8679 Personal history of other diseases of the circulatory system: Secondary | ICD-10-CM

## 2014-08-23 DIAGNOSIS — I351 Nonrheumatic aortic (valve) insufficiency: Secondary | ICD-10-CM

## 2014-08-23 DIAGNOSIS — I359 Nonrheumatic aortic valve disorder, unspecified: Secondary | ICD-10-CM

## 2014-08-23 LAB — POCT INR: INR: 3.1

## 2014-09-11 ENCOUNTER — Ambulatory Visit (INDEPENDENT_AMBULATORY_CARE_PROVIDER_SITE_OTHER): Payer: 59 | Admitting: Thoracic Surgery (Cardiothoracic Vascular Surgery)

## 2014-09-11 ENCOUNTER — Encounter: Payer: Self-pay | Admitting: Thoracic Surgery (Cardiothoracic Vascular Surgery)

## 2014-09-11 VITALS — BP 122/82 | HR 84 | Resp 16 | Ht 68.0 in | Wt 170.0 lb

## 2014-09-11 DIAGNOSIS — Z954 Presence of other heart-valve replacement: Secondary | ICD-10-CM

## 2014-09-11 DIAGNOSIS — I351 Nonrheumatic aortic (valve) insufficiency: Secondary | ICD-10-CM

## 2014-09-11 DIAGNOSIS — I481 Persistent atrial fibrillation: Secondary | ICD-10-CM

## 2014-09-11 DIAGNOSIS — I4819 Other persistent atrial fibrillation: Secondary | ICD-10-CM

## 2014-09-11 DIAGNOSIS — I712 Thoracic aortic aneurysm, without rupture, unspecified: Secondary | ICD-10-CM

## 2014-09-11 DIAGNOSIS — Q231 Congenital insufficiency of aortic valve: Secondary | ICD-10-CM

## 2014-09-11 NOTE — Patient Instructions (Signed)

## 2014-09-11 NOTE — Progress Notes (Signed)
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 He was last seen here in our office on 03/13/2014 at which time he was doing very well.  Since then he has continues to do very well. In November he had a brief period time when he experienced some palpitations. He underwent a 48 hour Holter monitor that revealed sinus rhythm with occasional PVCs and PACs. There was no atrial fibrillation recorded. He returns to our office for routine follow-up today. He states that he feels much better than he did prior to surgery one year ago.  He is exercising regularly and he reports his aerobic exercise tolerance is very good. He only gets short of breath if he really pushes himself hard with activity. He is back to normal activity both at work and with recreation, and he reports no physical limitations whatsoever. He has done well with long-term anticoagulation therapy using warfarin. Overall he is delighted with his progress.   Current Outpatient Prescriptions  Medication Sig Dispense Refill  . aspirin EC 81 MG tablet Take 1 tablet (81 mg total) by mouth daily. 90 tablet 3  . carvedilol (COREG) 12.5 MG tablet TAKE 1 TABLET 2 TIMES A DAY. 60 tablet 6  . losartan (COZAAR) 50 MG tablet TAKE 1&1/2 TABLET BY MOUTH TWICE DAILY 90 tablet 3  . spironolactone (ALDACTONE) 25 MG tablet TAKE 1 TABLET BY MOUTH DAILY 30 tablet 6  . warfarin (COUMADIN) 5 MG tablet Take as directed by Coumadin clinic 40 tablet 3   No current  facility-administered medications for this visit.      Physical Exam:   BP 122/82 mmHg  Pulse 84  Resp 16  Ht 5\' 8"  (1.727 m)  Wt 170 lb (77.111 kg)  BMI 25.85 kg/m2  SpO2 98%  General:  Well-appearing  Chest:   clear  CV:   Regular rate and rhythm with mechanical heart sounds  Incisions:  Completely healed, sternum stable  Abdomen:  Soft and nontender  Extremities:  Warm and well-perfused  Diagnostic Tests:  2 channel telemetry rhythm strip demonstrates normal sinus rhythm   Impression:  Patient is doing very well nearly 1 year status post Bentall aortic root replacement using mechanical valve conduit and Maze procedure. He is maintaining normal sinus rhythm. His exercise tolerance is quite good and he reports no physical limitations whatsoever. Holter monitor performed 2 months ago demonstrates sinus rhythm with occasional PVCs and PACs.  He remains chronically anticoagulated using warfarin because of his bileaflet mechanical aortic valve prosthesis.    Plan:  The patient will continue to follow-up with Dr. Aundra Dubin and Dr. Haroldine Laws in the heart failure clinic.  He will be referred to the atrial fibrillation clinic for long-term surveillance following maze procedure. He will return to see [place] here in our office in 1 year. He has been reminded regarding the lifelong need for antibiotic prophylaxis for all dental cleaning and related procedures.    I  spent in excess of 15 minutes during the conduct of this office consultation and >50% of this time involved direct face-to-face encounter with the patient for counseling and/or coordination of their care.  Valentina Gu. Roxy Manns, MD 09/11/2014 3:38 PM

## 2014-09-18 ENCOUNTER — Ambulatory Visit: Payer: Self-pay | Admitting: Thoracic Surgery (Cardiothoracic Vascular Surgery)

## 2014-09-22 ENCOUNTER — Other Ambulatory Visit: Payer: Self-pay | Admitting: Cardiology

## 2014-10-04 ENCOUNTER — Ambulatory Visit (INDEPENDENT_AMBULATORY_CARE_PROVIDER_SITE_OTHER): Payer: 59 | Admitting: *Deleted

## 2014-10-04 DIAGNOSIS — Z9889 Other specified postprocedural states: Secondary | ICD-10-CM

## 2014-10-04 DIAGNOSIS — Z8679 Personal history of other diseases of the circulatory system: Secondary | ICD-10-CM

## 2014-10-04 DIAGNOSIS — I351 Nonrheumatic aortic (valve) insufficiency: Secondary | ICD-10-CM

## 2014-10-04 DIAGNOSIS — I359 Nonrheumatic aortic valve disorder, unspecified: Secondary | ICD-10-CM

## 2014-10-04 LAB — POCT INR: INR: 3.9

## 2014-10-23 ENCOUNTER — Telehealth (HOSPITAL_COMMUNITY): Payer: Self-pay | Admitting: Vascular Surgery

## 2014-10-23 DIAGNOSIS — I5022 Chronic systolic (congestive) heart failure: Secondary | ICD-10-CM

## 2014-10-23 MED ORDER — LOSARTAN POTASSIUM 50 MG PO TABS: ORAL_TABLET | ORAL | Status: DC

## 2014-10-23 NOTE — Telephone Encounter (Signed)
Pt states that pharmacy tried to contact us several times.. Pt needs new prescription Losartan

## 2014-10-24 ENCOUNTER — Other Ambulatory Visit (HOSPITAL_COMMUNITY): Payer: Self-pay | Admitting: Internal Medicine

## 2014-11-08 ENCOUNTER — Ambulatory Visit (INDEPENDENT_AMBULATORY_CARE_PROVIDER_SITE_OTHER): Payer: 59 | Admitting: *Deleted

## 2014-11-08 DIAGNOSIS — Z8679 Personal history of other diseases of the circulatory system: Secondary | ICD-10-CM

## 2014-11-08 DIAGNOSIS — Z9889 Other specified postprocedural states: Secondary | ICD-10-CM | POA: Diagnosis not present

## 2014-11-08 DIAGNOSIS — I351 Nonrheumatic aortic (valve) insufficiency: Secondary | ICD-10-CM

## 2014-11-08 DIAGNOSIS — I359 Nonrheumatic aortic valve disorder, unspecified: Secondary | ICD-10-CM | POA: Diagnosis not present

## 2014-11-08 LAB — POCT INR: INR: 2.3

## 2014-11-29 ENCOUNTER — Ambulatory Visit (INDEPENDENT_AMBULATORY_CARE_PROVIDER_SITE_OTHER): Payer: 59 | Admitting: *Deleted

## 2014-11-29 DIAGNOSIS — I351 Nonrheumatic aortic (valve) insufficiency: Secondary | ICD-10-CM

## 2014-11-29 DIAGNOSIS — Z9889 Other specified postprocedural states: Secondary | ICD-10-CM | POA: Diagnosis not present

## 2014-11-29 DIAGNOSIS — Z8679 Personal history of other diseases of the circulatory system: Secondary | ICD-10-CM

## 2014-11-29 DIAGNOSIS — I359 Nonrheumatic aortic valve disorder, unspecified: Secondary | ICD-10-CM | POA: Diagnosis not present

## 2014-11-29 LAB — POCT INR: INR: 1.9

## 2014-12-06 ENCOUNTER — Other Ambulatory Visit: Payer: Self-pay | Admitting: Cardiology

## 2014-12-13 ENCOUNTER — Ambulatory Visit (INDEPENDENT_AMBULATORY_CARE_PROVIDER_SITE_OTHER): Payer: 59 | Admitting: *Deleted

## 2014-12-13 DIAGNOSIS — I359 Nonrheumatic aortic valve disorder, unspecified: Secondary | ICD-10-CM | POA: Diagnosis not present

## 2014-12-13 DIAGNOSIS — Z8679 Personal history of other diseases of the circulatory system: Secondary | ICD-10-CM

## 2014-12-13 DIAGNOSIS — Z9889 Other specified postprocedural states: Secondary | ICD-10-CM

## 2014-12-13 DIAGNOSIS — I351 Nonrheumatic aortic (valve) insufficiency: Secondary | ICD-10-CM

## 2014-12-13 LAB — POCT INR: INR: 2.6

## 2014-12-19 ENCOUNTER — Other Ambulatory Visit: Payer: Self-pay | Admitting: Cardiology

## 2015-01-01 ENCOUNTER — Other Ambulatory Visit (HOSPITAL_COMMUNITY): Payer: Self-pay | Admitting: Internal Medicine

## 2015-01-03 ENCOUNTER — Other Ambulatory Visit: Payer: Self-pay | Admitting: Cardiology

## 2015-01-03 ENCOUNTER — Ambulatory Visit (INDEPENDENT_AMBULATORY_CARE_PROVIDER_SITE_OTHER): Payer: 59 | Admitting: *Deleted

## 2015-01-03 DIAGNOSIS — Z8679 Personal history of other diseases of the circulatory system: Secondary | ICD-10-CM

## 2015-01-03 DIAGNOSIS — I351 Nonrheumatic aortic (valve) insufficiency: Secondary | ICD-10-CM

## 2015-01-03 DIAGNOSIS — I359 Nonrheumatic aortic valve disorder, unspecified: Secondary | ICD-10-CM

## 2015-01-03 DIAGNOSIS — Z9889 Other specified postprocedural states: Secondary | ICD-10-CM | POA: Diagnosis not present

## 2015-01-03 LAB — POCT INR: INR: 1.8

## 2015-01-24 ENCOUNTER — Ambulatory Visit (INDEPENDENT_AMBULATORY_CARE_PROVIDER_SITE_OTHER): Payer: 59 | Admitting: *Deleted

## 2015-01-24 DIAGNOSIS — I351 Nonrheumatic aortic (valve) insufficiency: Secondary | ICD-10-CM | POA: Diagnosis not present

## 2015-01-24 DIAGNOSIS — Z9889 Other specified postprocedural states: Secondary | ICD-10-CM

## 2015-01-24 DIAGNOSIS — I359 Nonrheumatic aortic valve disorder, unspecified: Secondary | ICD-10-CM

## 2015-01-24 DIAGNOSIS — Z8679 Personal history of other diseases of the circulatory system: Secondary | ICD-10-CM

## 2015-01-24 LAB — POCT INR: INR: 2.5

## 2015-02-01 ENCOUNTER — Other Ambulatory Visit (HOSPITAL_COMMUNITY): Payer: Self-pay | Admitting: Internal Medicine

## 2015-02-06 ENCOUNTER — Other Ambulatory Visit: Payer: Self-pay | Admitting: Cardiology

## 2015-02-19 ENCOUNTER — Ambulatory Visit (INDEPENDENT_AMBULATORY_CARE_PROVIDER_SITE_OTHER): Payer: 59 | Admitting: Pharmacist

## 2015-02-19 DIAGNOSIS — I359 Nonrheumatic aortic valve disorder, unspecified: Secondary | ICD-10-CM | POA: Diagnosis not present

## 2015-02-19 DIAGNOSIS — Z9889 Other specified postprocedural states: Secondary | ICD-10-CM

## 2015-02-19 DIAGNOSIS — Z8679 Personal history of other diseases of the circulatory system: Secondary | ICD-10-CM

## 2015-02-19 DIAGNOSIS — I351 Nonrheumatic aortic (valve) insufficiency: Secondary | ICD-10-CM | POA: Diagnosis not present

## 2015-02-19 LAB — POCT INR: INR: 1.9

## 2015-03-05 ENCOUNTER — Ambulatory Visit (INDEPENDENT_AMBULATORY_CARE_PROVIDER_SITE_OTHER): Payer: 59

## 2015-03-05 DIAGNOSIS — Z9889 Other specified postprocedural states: Secondary | ICD-10-CM

## 2015-03-05 DIAGNOSIS — Z8679 Personal history of other diseases of the circulatory system: Secondary | ICD-10-CM

## 2015-03-05 DIAGNOSIS — I359 Nonrheumatic aortic valve disorder, unspecified: Secondary | ICD-10-CM | POA: Diagnosis not present

## 2015-03-05 DIAGNOSIS — I351 Nonrheumatic aortic (valve) insufficiency: Secondary | ICD-10-CM | POA: Diagnosis not present

## 2015-03-05 LAB — POCT INR: INR: 3.7

## 2015-03-06 ENCOUNTER — Telehealth: Payer: Self-pay

## 2015-03-06 NOTE — Telephone Encounter (Signed)
-----   Message from [name], MD sent at 03/05/2015  9:43 PM EDT ----- No, should stay in the 2.5-3.5 range, still higher risk with paroxysmal afib.   ----- Message -----    From: Theophilus Kinds, RN    Sent: 03/05/2015   4:06 PM      To: [name], MD  Pt was seen in Coumadin Clinic today, pt inquiring if his INR goal should or could be reduced back down to 2.0-3.0 now that he is not in afib and is in NSR.  Pt states the only reason it was raised to 2.5-3.5 by Dr Aundra Dubin is because he was in afib, prior to that his INR goal was 2.0-3.0.  Please advise if you would like to change pt's INR goal range.  Thanks!

## 2015-03-08 ENCOUNTER — Other Ambulatory Visit (HOSPITAL_COMMUNITY): Payer: Self-pay | Admitting: Internal Medicine

## 2015-03-10 ENCOUNTER — Other Ambulatory Visit (HOSPITAL_COMMUNITY): Payer: Self-pay | Admitting: Internal Medicine

## 2015-03-10 ENCOUNTER — Other Ambulatory Visit: Payer: Self-pay | Admitting: Cardiology

## 2015-04-10 ENCOUNTER — Other Ambulatory Visit (HOSPITAL_COMMUNITY): Payer: Self-pay | Admitting: *Deleted

## 2015-04-10 MED ORDER — SPIRONOLACTONE 25 MG PO TABS: 25.0000 mg | ORAL_TABLET | Freq: Every day | ORAL | Status: DC

## 2015-04-11 ENCOUNTER — Other Ambulatory Visit: Payer: Self-pay | Admitting: Internal Medicine

## 2015-04-17 ENCOUNTER — Other Ambulatory Visit: Payer: Self-pay | Admitting: Cardiology

## 2015-04-19 ENCOUNTER — Ambulatory Visit (INDEPENDENT_AMBULATORY_CARE_PROVIDER_SITE_OTHER): Payer: 59 | Admitting: *Deleted

## 2015-04-19 DIAGNOSIS — I351 Nonrheumatic aortic (valve) insufficiency: Secondary | ICD-10-CM

## 2015-04-19 DIAGNOSIS — Z9889 Other specified postprocedural states: Secondary | ICD-10-CM

## 2015-04-19 DIAGNOSIS — I359 Nonrheumatic aortic valve disorder, unspecified: Secondary | ICD-10-CM | POA: Diagnosis not present

## 2015-04-19 DIAGNOSIS — Z8679 Personal history of other diseases of the circulatory system: Secondary | ICD-10-CM

## 2015-04-19 LAB — POCT INR: INR: 3

## 2015-05-16 ENCOUNTER — Other Ambulatory Visit: Payer: Self-pay | Admitting: Internal Medicine

## 2015-05-16 ENCOUNTER — Other Ambulatory Visit: Payer: Self-pay | Admitting: Cardiology

## 2015-06-22 ENCOUNTER — Ambulatory Visit (INDEPENDENT_AMBULATORY_CARE_PROVIDER_SITE_OTHER): Payer: 59 | Admitting: *Deleted

## 2015-06-22 DIAGNOSIS — Z8679 Personal history of other diseases of the circulatory system: Secondary | ICD-10-CM | POA: Diagnosis not present

## 2015-06-22 DIAGNOSIS — I359 Nonrheumatic aortic valve disorder, unspecified: Secondary | ICD-10-CM

## 2015-06-22 DIAGNOSIS — Z9889 Other specified postprocedural states: Secondary | ICD-10-CM

## 2015-06-22 DIAGNOSIS — I351 Nonrheumatic aortic (valve) insufficiency: Secondary | ICD-10-CM

## 2015-06-22 LAB — POCT INR: INR: 2.6

## 2015-07-20 ENCOUNTER — Ambulatory Visit (INDEPENDENT_AMBULATORY_CARE_PROVIDER_SITE_OTHER): Payer: 59 | Admitting: *Deleted

## 2015-07-20 DIAGNOSIS — I359 Nonrheumatic aortic valve disorder, unspecified: Secondary | ICD-10-CM | POA: Diagnosis not present

## 2015-07-20 DIAGNOSIS — Z8679 Personal history of other diseases of the circulatory system: Secondary | ICD-10-CM

## 2015-07-20 DIAGNOSIS — I351 Nonrheumatic aortic (valve) insufficiency: Secondary | ICD-10-CM | POA: Diagnosis not present

## 2015-07-20 DIAGNOSIS — Z9889 Other specified postprocedural states: Secondary | ICD-10-CM

## 2015-07-20 LAB — POCT INR: INR: 2.2

## 2015-07-30 ENCOUNTER — Other Ambulatory Visit: Payer: Self-pay

## 2015-07-30 ENCOUNTER — Other Ambulatory Visit: Payer: Self-pay | Admitting: Cardiology

## 2015-07-30 MED ORDER — WARFARIN SODIUM 5 MG PO TABS: ORAL_TABLET | ORAL | Status: DC

## 2015-08-09 ENCOUNTER — Ambulatory Visit (INDEPENDENT_AMBULATORY_CARE_PROVIDER_SITE_OTHER): Payer: Commercial Managed Care - HMO | Admitting: *Deleted

## 2015-08-09 DIAGNOSIS — Z8679 Personal history of other diseases of the circulatory system: Secondary | ICD-10-CM | POA: Diagnosis not present

## 2015-08-09 DIAGNOSIS — Z9889 Other specified postprocedural states: Secondary | ICD-10-CM

## 2015-08-09 DIAGNOSIS — I359 Nonrheumatic aortic valve disorder, unspecified: Secondary | ICD-10-CM

## 2015-08-09 DIAGNOSIS — I351 Nonrheumatic aortic (valve) insufficiency: Secondary | ICD-10-CM | POA: Diagnosis not present

## 2015-08-09 LAB — POCT INR: INR: 3.6

## 2015-08-30 ENCOUNTER — Ambulatory Visit (INDEPENDENT_AMBULATORY_CARE_PROVIDER_SITE_OTHER): Payer: 59 | Admitting: *Deleted

## 2015-08-30 DIAGNOSIS — Z8679 Personal history of other diseases of the circulatory system: Secondary | ICD-10-CM | POA: Diagnosis not present

## 2015-08-30 DIAGNOSIS — Z9889 Other specified postprocedural states: Secondary | ICD-10-CM

## 2015-08-30 DIAGNOSIS — I351 Nonrheumatic aortic (valve) insufficiency: Secondary | ICD-10-CM | POA: Diagnosis not present

## 2015-08-30 LAB — POCT INR: INR: 1.5

## 2015-09-06 ENCOUNTER — Ambulatory Visit (INDEPENDENT_AMBULATORY_CARE_PROVIDER_SITE_OTHER): Payer: 59

## 2015-09-06 DIAGNOSIS — I359 Nonrheumatic aortic valve disorder, unspecified: Secondary | ICD-10-CM | POA: Diagnosis not present

## 2015-09-06 DIAGNOSIS — Z8679 Personal history of other diseases of the circulatory system: Secondary | ICD-10-CM

## 2015-09-06 DIAGNOSIS — I351 Nonrheumatic aortic (valve) insufficiency: Secondary | ICD-10-CM | POA: Diagnosis not present

## 2015-09-06 DIAGNOSIS — Z9889 Other specified postprocedural states: Secondary | ICD-10-CM

## 2015-09-06 LAB — POCT INR: INR: 3.2

## 2015-09-10 ENCOUNTER — Ambulatory Visit: Payer: 59 | Admitting: Thoracic Surgery (Cardiothoracic Vascular Surgery)

## 2015-09-20 ENCOUNTER — Ambulatory Visit (INDEPENDENT_AMBULATORY_CARE_PROVIDER_SITE_OTHER): Payer: 59 | Admitting: *Deleted

## 2015-09-20 DIAGNOSIS — Z9889 Other specified postprocedural states: Secondary | ICD-10-CM | POA: Diagnosis not present

## 2015-09-20 DIAGNOSIS — I351 Nonrheumatic aortic (valve) insufficiency: Secondary | ICD-10-CM

## 2015-09-20 DIAGNOSIS — I359 Nonrheumatic aortic valve disorder, unspecified: Secondary | ICD-10-CM

## 2015-09-20 DIAGNOSIS — Z8679 Personal history of other diseases of the circulatory system: Secondary | ICD-10-CM | POA: Diagnosis not present

## 2015-09-20 LAB — POCT INR: INR: 3.2

## 2015-09-24 ENCOUNTER — Ambulatory Visit (INDEPENDENT_AMBULATORY_CARE_PROVIDER_SITE_OTHER): Payer: 59 | Admitting: Thoracic Surgery (Cardiothoracic Vascular Surgery)

## 2015-09-24 ENCOUNTER — Encounter: Payer: Self-pay | Admitting: Thoracic Surgery (Cardiothoracic Vascular Surgery)

## 2015-09-24 VITALS — BP 139/85 | HR 60 | Resp 16 | Ht 68.0 in | Wt 158.0 lb

## 2015-09-24 DIAGNOSIS — Z9889 Other specified postprocedural states: Secondary | ICD-10-CM | POA: Diagnosis not present

## 2015-09-24 DIAGNOSIS — Z954 Presence of other heart-valve replacement: Secondary | ICD-10-CM

## 2015-09-24 DIAGNOSIS — Z8679 Personal history of other diseases of the circulatory system: Secondary | ICD-10-CM

## 2015-09-24 NOTE — Progress Notes (Signed)
      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 He was last seen here in our office on 09/11/2014 at which time he was doing very well.  His most recent echocardiogram was performed 02/15/2014. At that time his mechanical aortic valve was functioning normally and his ejection fraction was reported 45%, increased from 35-40% prior to surgery 6 months previously. The patient returns to our office today for routine follow-up. He states that he is doing remarkably well. He exercises regularly and he reports no physical limitations. He states that he only gets short of breath with very strenuous physical exertion. He otherwise has no significant exertional shortness of breath or chest discomfort. He reports an occasional brief episode of tachycardia palpitations, but these are infrequent and very transient. He has not had any problems with long-term anticoagulation therapy using warfarin.   Current Outpatient Prescriptions  Medication Sig Dispense Refill  . aspirin EC 81 MG tablet Take 1 tablet (81 mg total) by mouth daily. 90 tablet 3  . carvedilol (COREG) 12.5 MG tablet TAKE 1 TABLET BY MOUTH TWICE DAILY 60 tablet 3  . losartan (COZAAR) 50 MG tablet TAKE 1&1/2 TABLET BY MOUTH TWICE DAILY 90 tablet 3  . spironolactone (ALDACTONE) 25 MG tablet TAKE 1 TABLET BY MOUTH DAILY 30 tablet 3  . warfarin  (COUMADIN) 5 MG tablet TAKE AS DIRECTED BY COUMADIN CLINIC 45 tablet 3   No current facility-administered medications for this visit.      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 He is maintaining sinus rhythm and clinically doing very well.   Plan:  In the future the patient will call and return to see [place] as needed. He will continue to follow-up with Dr. Aundra Dubin.  The patient has been reminded regarding the importance of dental hygiene and the lifelong need for antibiotic prophylaxis for all dental cleanings and other related invasive procedures.  I spent in excess of 10 minutes during the conduct of this office consultation and >50% of this time involved direct face-to-face encounter with the patient for counseling and/or coordination of their care.  Valentina Gu. Roxy Manns, MD 09/24/2015 5:07 PM

## 2015-09-24 NOTE — Patient Instructions (Signed)

## 2015-09-26 ENCOUNTER — Telehealth (HOSPITAL_COMMUNITY): Payer: Self-pay | Admitting: Vascular Surgery

## 2015-09-26 NOTE — Telephone Encounter (Signed)
Left pt message to make f/u appt w/ [facility] in April

## 2015-10-04 ENCOUNTER — Telehealth (HOSPITAL_COMMUNITY): Payer: Self-pay | Admitting: Vascular Surgery

## 2015-10-04 NOTE — Telephone Encounter (Signed)
Left pt message to make appt w/[facility] 

## 2015-10-18 ENCOUNTER — Ambulatory Visit (INDEPENDENT_AMBULATORY_CARE_PROVIDER_SITE_OTHER): Payer: 59 | Admitting: *Deleted

## 2015-10-18 ENCOUNTER — Telehealth (HOSPITAL_COMMUNITY): Payer: Self-pay | Admitting: Vascular Surgery

## 2015-10-18 DIAGNOSIS — I359 Nonrheumatic aortic valve disorder, unspecified: Secondary | ICD-10-CM | POA: Diagnosis not present

## 2015-10-18 DIAGNOSIS — Z8679 Personal history of other diseases of the circulatory system: Secondary | ICD-10-CM

## 2015-10-18 DIAGNOSIS — I351 Nonrheumatic aortic (valve) insufficiency: Secondary | ICD-10-CM

## 2015-10-18 DIAGNOSIS — Z9889 Other specified postprocedural states: Secondary | ICD-10-CM

## 2015-10-18 LAB — POCT INR: INR: 3.2

## 2015-10-18 NOTE — Telephone Encounter (Signed)
Left pt messagt o make f/u appt w/ [facility]

## 2015-10-23 ENCOUNTER — Telehealth (HOSPITAL_COMMUNITY): Payer: Self-pay | Admitting: Vascular Surgery

## 2015-10-23 NOTE — Telephone Encounter (Signed)
Left pt message to make appt 

## 2015-10-31 ENCOUNTER — Encounter (HOSPITAL_COMMUNITY): Payer: Self-pay | Admitting: *Deleted

## 2015-11-15 IMAGING — CR DG CHEST 1V PORT
1 series · 1 of 1 positions shown · non-contrast
Comparison: none

[AP]
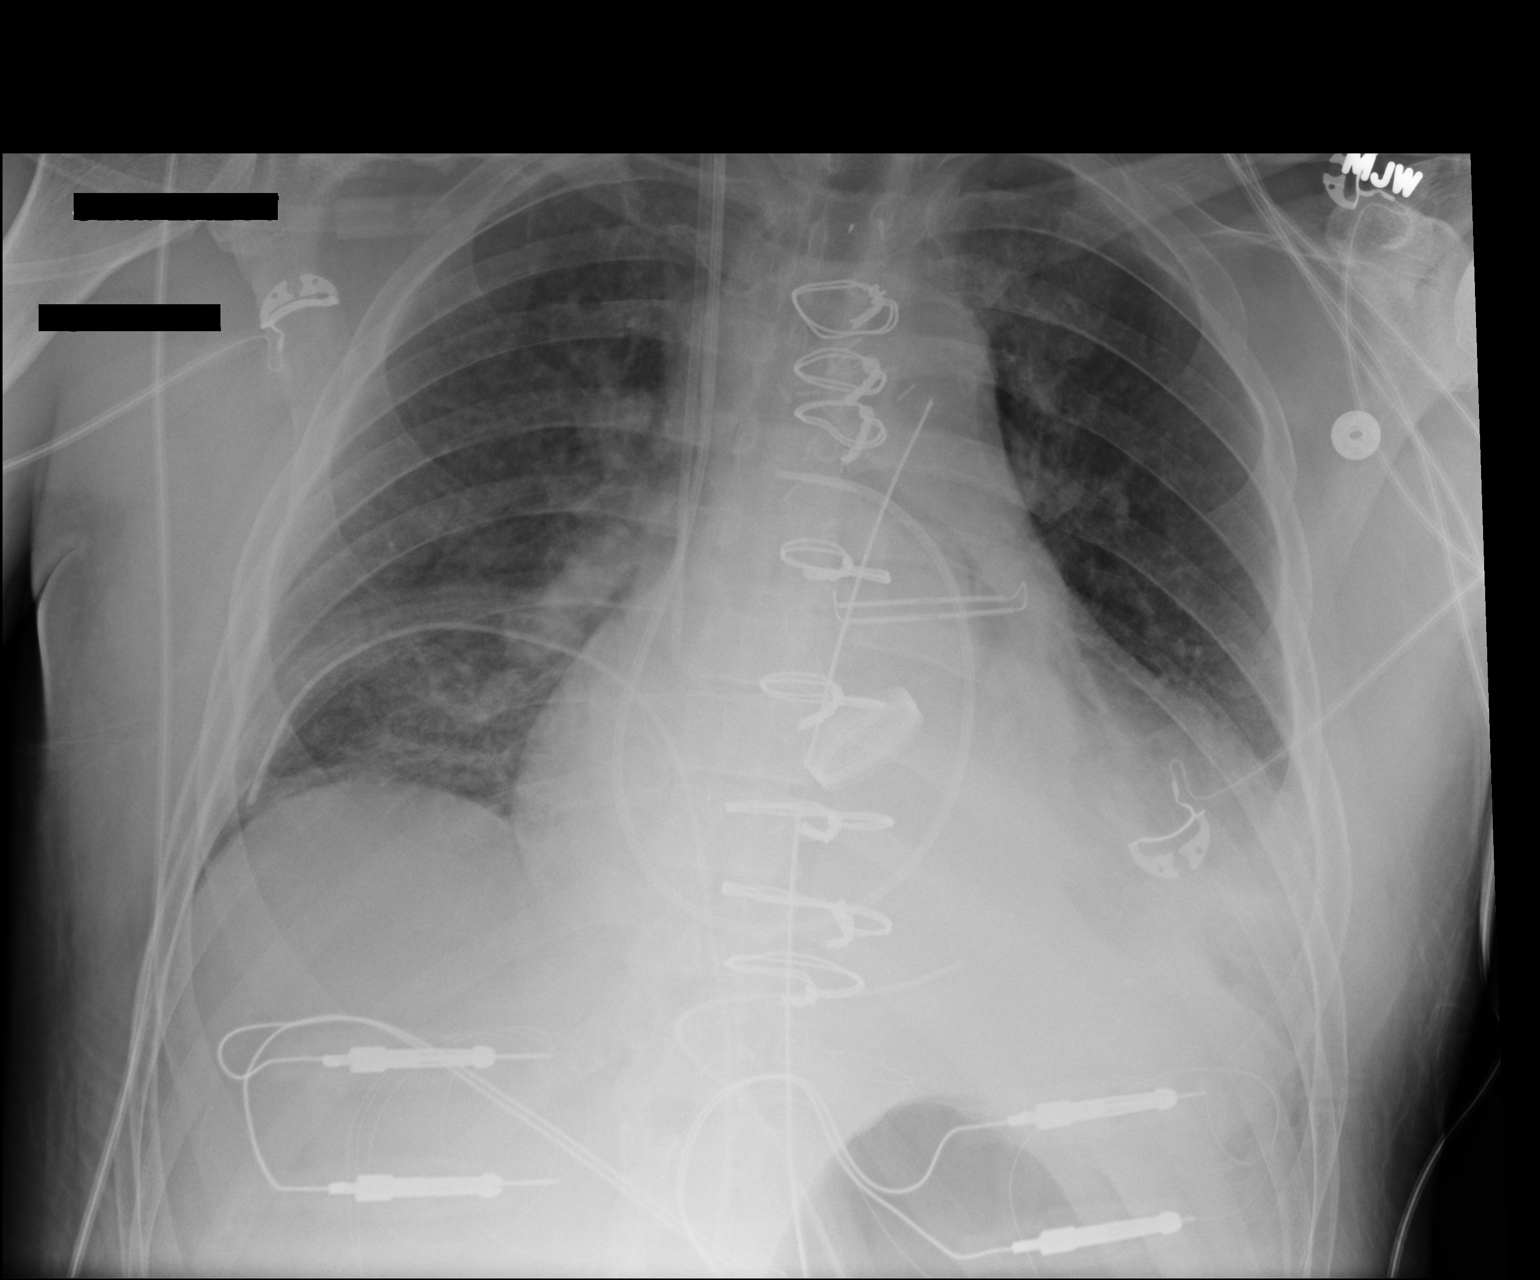

[1 of 1 positions shown; findings below may reference images not displayed]

CLINICAL DATA
Postoperative aortic valve replacement

EXAM
PORTABLE CHEST - 1 VIEW

COMPARISON
September 27, 2013

FINDINGS
Endotracheal tube and nasogastric tube have been removed. Swan-Ganz
catheter tip is in the right main pulmonary artery. There is a
mediastinal drain, unchanged in position. There is a catheter
extending to the left atrium from below the diaphragm. Temporary
pacemaker wires are attached to the right heart. There is no
pneumothorax. There is consolidation in both medial lung bases,
slightly increased bilaterally. Heart is enlarged with normal
pulmonary vascularity. There is an aortic valve replacement present.
There is also a clamp in the left atrial appendage region.

IMPRESSION
Tube and catheter positions as described without pneumothorax.
Stable cardiomegaly. Consolidation in both medial bases, slightly
increased bilaterally.

SIGNATURE

## 2015-11-17 IMAGING — CR DG CHEST 2V
2 series · 2 of 2 positions shown · non-contrast
Comparison: DG CHEST 1V PORT dated 09/29/2013;

CLINICAL DATA: Followup atelectasis.

EXAM:
CHEST  2 VIEW

[w chest lat]
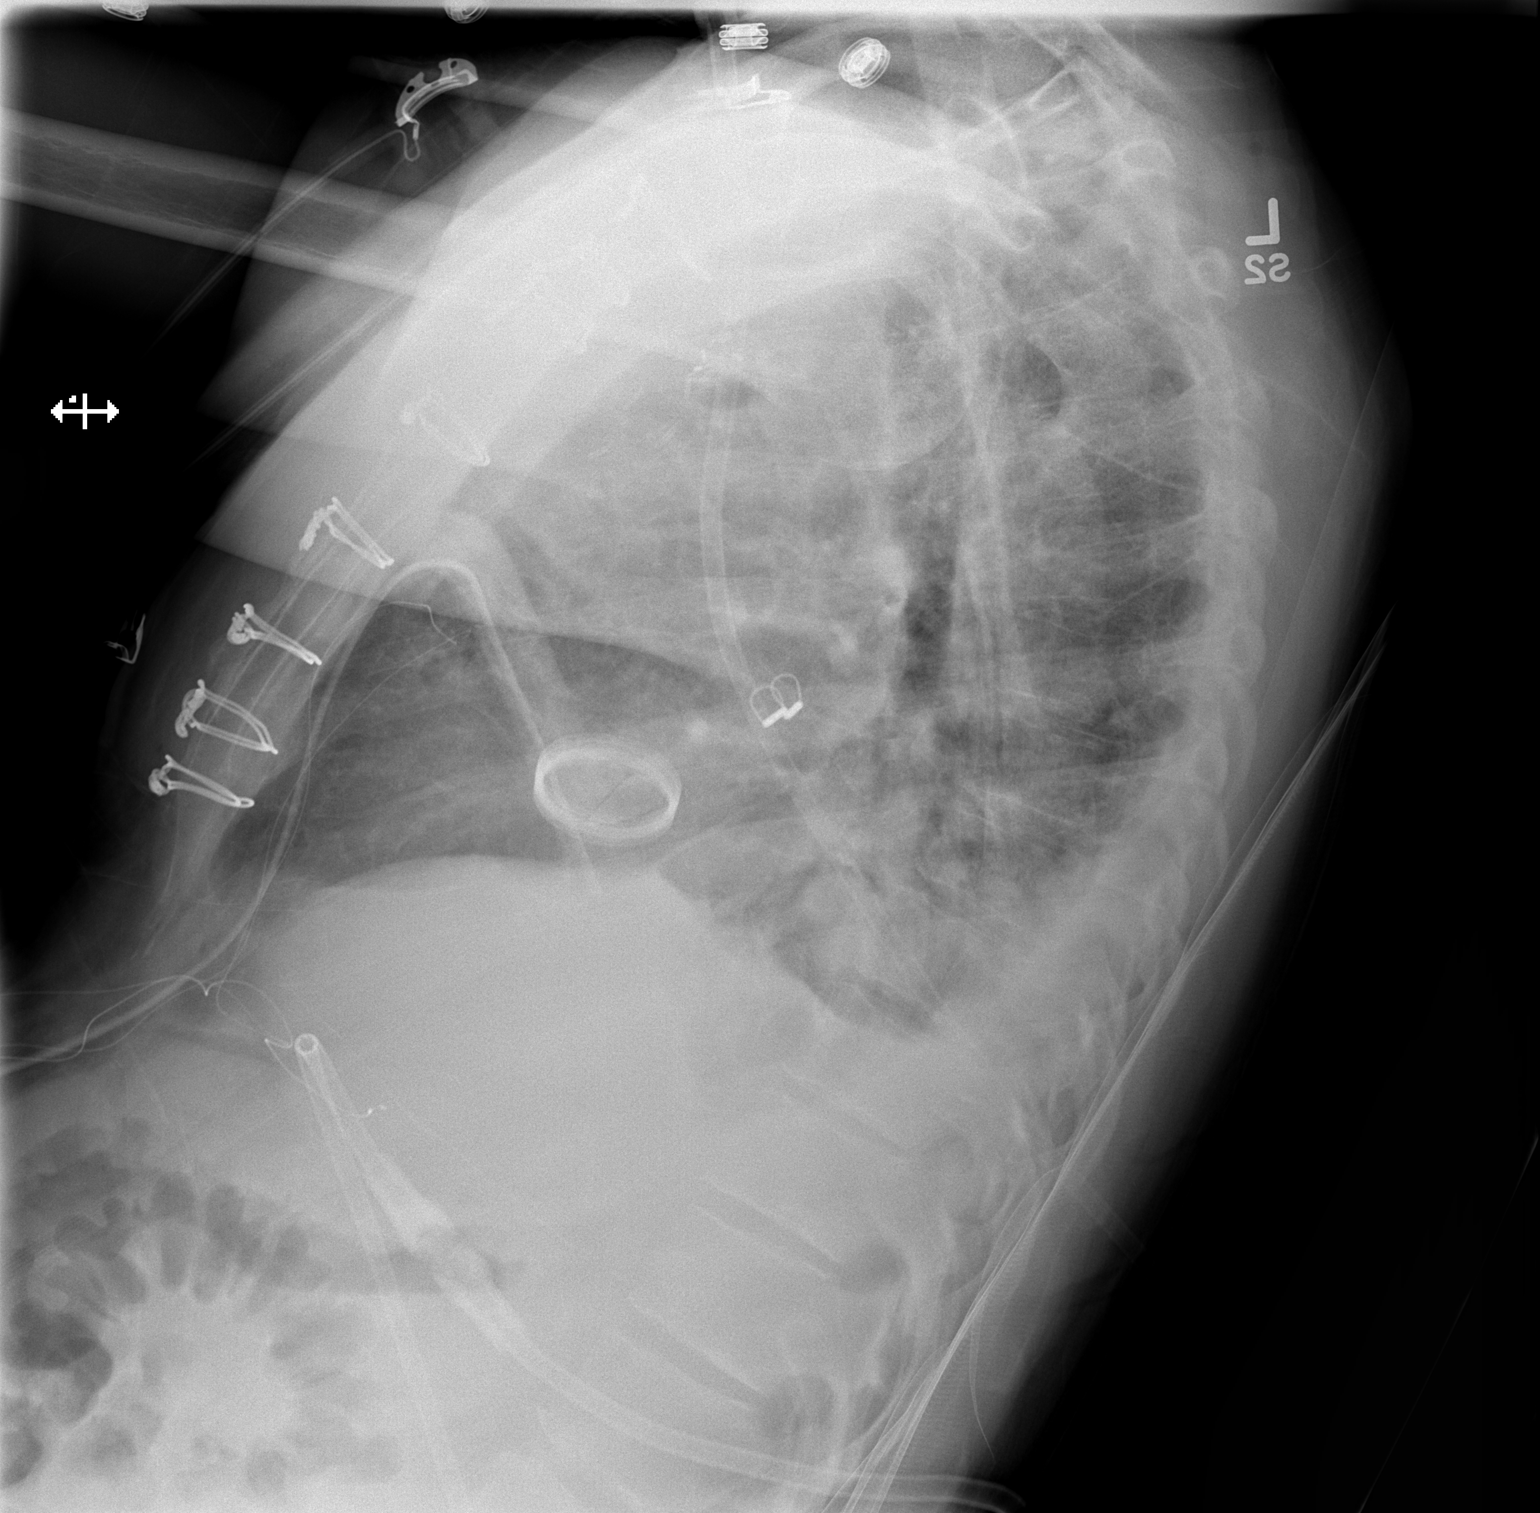

[x chest ap]
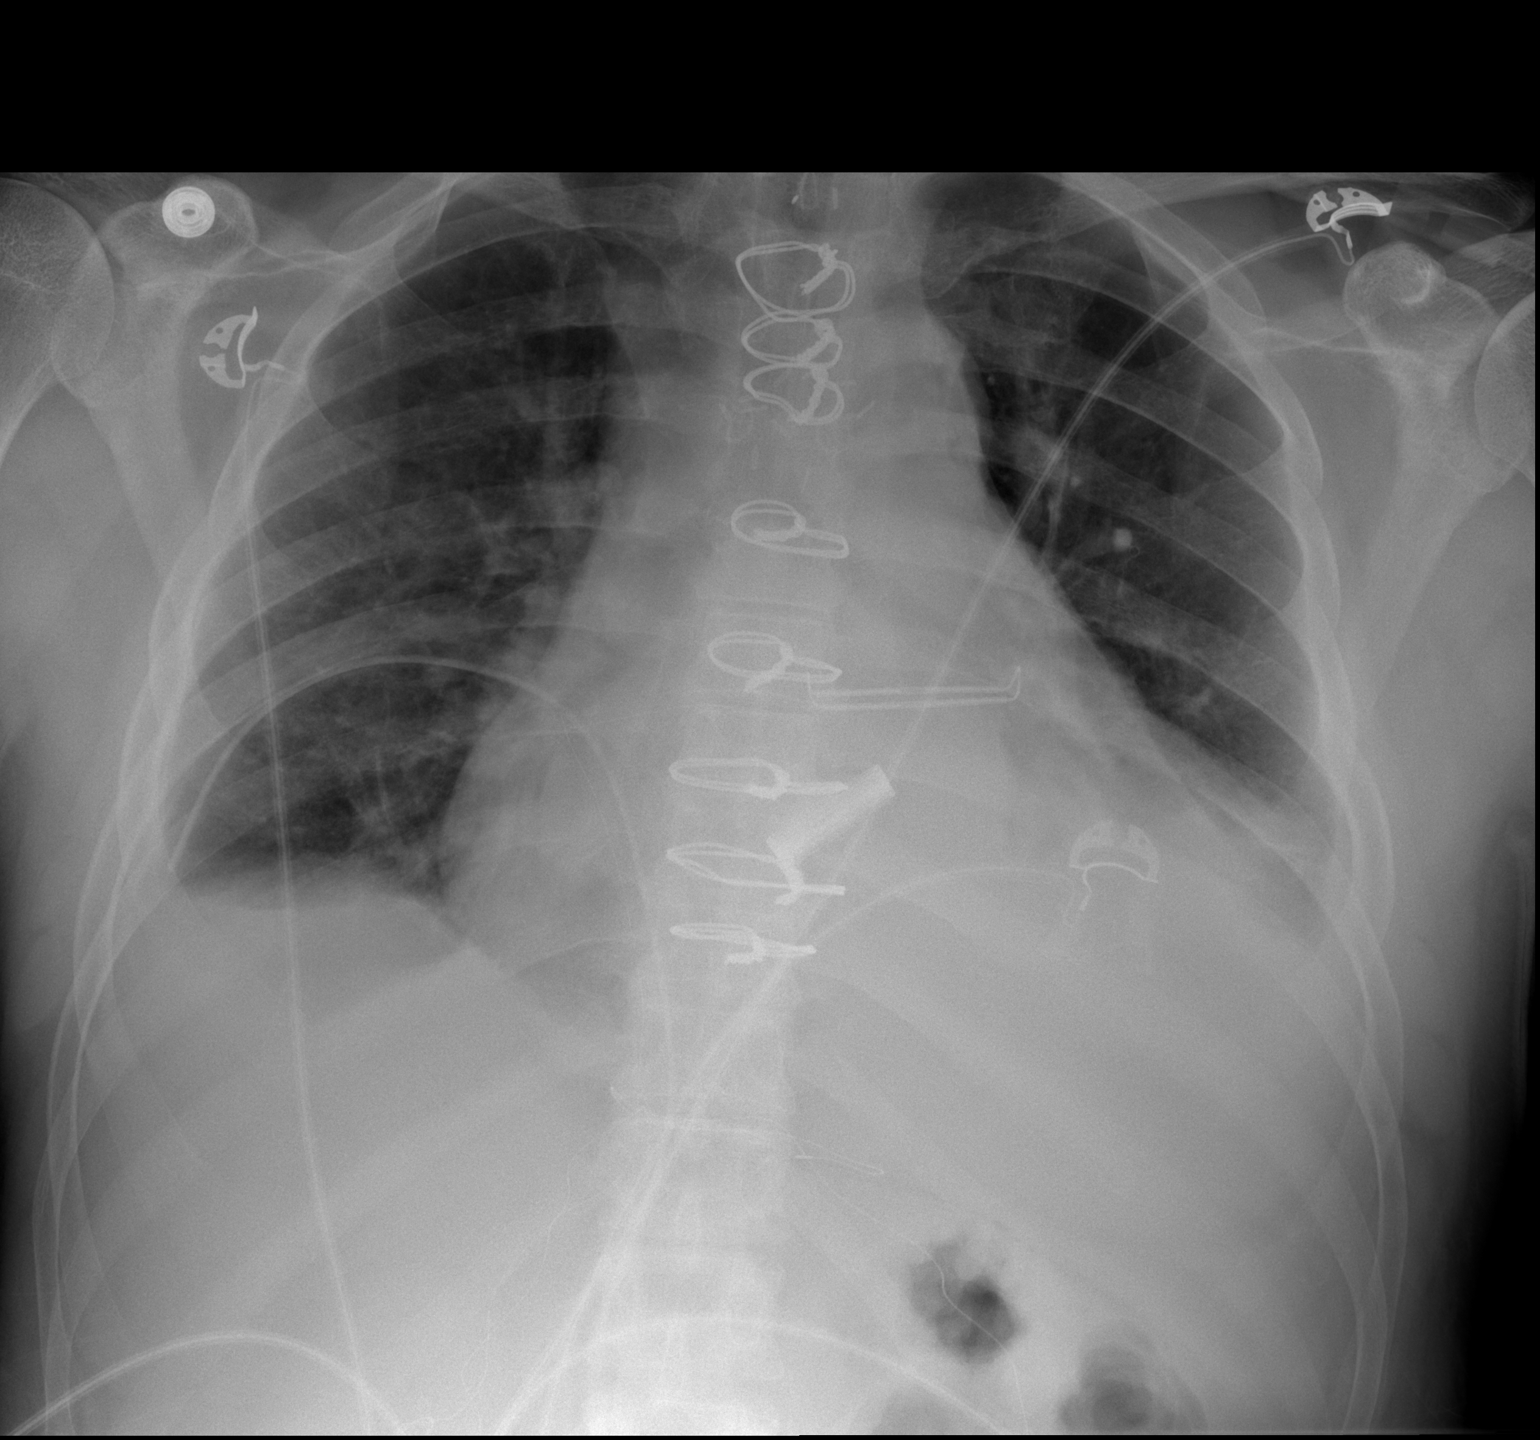

[2 of 2 positions shown; findings below may reference images not displayed]

CT ANGIO CHEST W/CM
&/OR WO/CM dated 07/28/2013; DG CHEST 1V PORT dated 07/28/2013
FINDINGS: Trachea is midline. Heart is enlarged, stable. Eight intact
sternotomy wires. Epicardial pacer wires remain in place. There are
small bilateral effusions with bibasilar airspace disease, left
greater than right. Right chest tube remains in place. No
pneumothorax.
IMPRESSION: Small bilateral effusions and bibasilar airspace disease, left
greater than right, stable. Right chest tube in place.

## 2015-12-16 ENCOUNTER — Other Ambulatory Visit: Payer: Self-pay | Admitting: Cardiology

## 2015-12-24 ENCOUNTER — Ambulatory Visit (INDEPENDENT_AMBULATORY_CARE_PROVIDER_SITE_OTHER): Payer: 59 | Admitting: *Deleted

## 2015-12-24 DIAGNOSIS — Z8679 Personal history of other diseases of the circulatory system: Secondary | ICD-10-CM | POA: Diagnosis not present

## 2015-12-24 DIAGNOSIS — Z9889 Other specified postprocedural states: Secondary | ICD-10-CM | POA: Diagnosis not present

## 2015-12-24 DIAGNOSIS — I351 Nonrheumatic aortic (valve) insufficiency: Secondary | ICD-10-CM

## 2015-12-24 DIAGNOSIS — I359 Nonrheumatic aortic valve disorder, unspecified: Secondary | ICD-10-CM

## 2015-12-24 LAB — POCT INR: INR: 1.4

## 2015-12-31 ENCOUNTER — Ambulatory Visit (INDEPENDENT_AMBULATORY_CARE_PROVIDER_SITE_OTHER): Payer: 59

## 2015-12-31 DIAGNOSIS — I351 Nonrheumatic aortic (valve) insufficiency: Secondary | ICD-10-CM

## 2015-12-31 DIAGNOSIS — I359 Nonrheumatic aortic valve disorder, unspecified: Secondary | ICD-10-CM

## 2015-12-31 DIAGNOSIS — Z8679 Personal history of other diseases of the circulatory system: Secondary | ICD-10-CM | POA: Diagnosis not present

## 2015-12-31 DIAGNOSIS — Z9889 Other specified postprocedural states: Secondary | ICD-10-CM | POA: Diagnosis not present

## 2015-12-31 LAB — POCT INR: INR: 1.9

## 2015-12-31 MED ORDER — WARFARIN SODIUM 5 MG PO TABS: ORAL_TABLET | ORAL | Status: DC

## 2016-01-14 ENCOUNTER — Ambulatory Visit (INDEPENDENT_AMBULATORY_CARE_PROVIDER_SITE_OTHER): Payer: 59 | Admitting: *Deleted

## 2016-01-14 DIAGNOSIS — Z8679 Personal history of other diseases of the circulatory system: Secondary | ICD-10-CM | POA: Diagnosis not present

## 2016-01-14 DIAGNOSIS — Z9889 Other specified postprocedural states: Secondary | ICD-10-CM | POA: Diagnosis not present

## 2016-01-14 DIAGNOSIS — I359 Nonrheumatic aortic valve disorder, unspecified: Secondary | ICD-10-CM

## 2016-01-14 DIAGNOSIS — I351 Nonrheumatic aortic (valve) insufficiency: Secondary | ICD-10-CM

## 2016-01-14 LAB — POCT INR: INR: 3.7

## 2016-01-16 ENCOUNTER — Other Ambulatory Visit (HOSPITAL_COMMUNITY): Payer: Self-pay | Admitting: *Deleted

## 2016-01-16 MED ORDER — LOSARTAN POTASSIUM 50 MG PO TABS: ORAL_TABLET | ORAL | Status: DC

## 2016-02-07 ENCOUNTER — Telehealth (HOSPITAL_COMMUNITY): Payer: Self-pay | Admitting: Vascular Surgery

## 2016-02-07 NOTE — Telephone Encounter (Signed)
Left pt message to make f/u appt 

## 2016-03-03 ENCOUNTER — Ambulatory Visit (INDEPENDENT_AMBULATORY_CARE_PROVIDER_SITE_OTHER): Payer: 59 | Admitting: *Deleted

## 2016-03-03 DIAGNOSIS — Z8679 Personal history of other diseases of the circulatory system: Secondary | ICD-10-CM

## 2016-03-03 DIAGNOSIS — I359 Nonrheumatic aortic valve disorder, unspecified: Secondary | ICD-10-CM | POA: Diagnosis not present

## 2016-03-03 DIAGNOSIS — Z9889 Other specified postprocedural states: Secondary | ICD-10-CM | POA: Diagnosis not present

## 2016-03-03 DIAGNOSIS — I351 Nonrheumatic aortic (valve) insufficiency: Secondary | ICD-10-CM | POA: Diagnosis not present

## 2016-03-03 LAB — POCT INR: INR: 2.6

## 2016-03-18 ENCOUNTER — Other Ambulatory Visit: Payer: Self-pay | Admitting: Cardiology

## 2016-03-27 ENCOUNTER — Other Ambulatory Visit: Payer: Self-pay | Admitting: Internal Medicine

## 2016-03-28 ENCOUNTER — Ambulatory Visit (INDEPENDENT_AMBULATORY_CARE_PROVIDER_SITE_OTHER): Payer: 59 | Admitting: *Deleted

## 2016-03-28 ENCOUNTER — Ambulatory Visit (HOSPITAL_COMMUNITY)
Admission: RE | Admit: 2016-03-28 | Discharge: 2016-03-28 | Disposition: A | Discharge status: Home / Self Care | Payer: 59 | Source: Ambulatory Visit | Attending: Cardiology | Admitting: Cardiology

## 2016-03-28 ENCOUNTER — Other Ambulatory Visit (HOSPITAL_COMMUNITY): Payer: Self-pay | Admitting: *Deleted

## 2016-03-28 VITALS — BP 130/86 | HR 68 | Wt 168.0 lb

## 2016-03-28 DIAGNOSIS — I480 Paroxysmal atrial fibrillation: Secondary | ICD-10-CM | POA: Diagnosis not present

## 2016-03-28 DIAGNOSIS — Z7982 Long term (current) use of aspirin: Secondary | ICD-10-CM | POA: Insufficient documentation

## 2016-03-28 DIAGNOSIS — I351 Nonrheumatic aortic (valve) insufficiency: Secondary | ICD-10-CM

## 2016-03-28 DIAGNOSIS — I428 Other cardiomyopathies: Secondary | ICD-10-CM | POA: Diagnosis not present

## 2016-03-28 DIAGNOSIS — Z9889 Other specified postprocedural states: Secondary | ICD-10-CM | POA: Diagnosis not present

## 2016-03-28 DIAGNOSIS — I359 Nonrheumatic aortic valve disorder, unspecified: Secondary | ICD-10-CM | POA: Diagnosis not present

## 2016-03-28 DIAGNOSIS — Z8679 Personal history of other diseases of the circulatory system: Secondary | ICD-10-CM

## 2016-03-28 DIAGNOSIS — Z79899 Other long term (current) drug therapy: Secondary | ICD-10-CM | POA: Diagnosis not present

## 2016-03-28 DIAGNOSIS — F1010 Alcohol abuse, uncomplicated: Secondary | ICD-10-CM | POA: Insufficient documentation

## 2016-03-28 DIAGNOSIS — Z7901 Long term (current) use of anticoagulants: Secondary | ICD-10-CM | POA: Diagnosis not present

## 2016-03-28 DIAGNOSIS — I4891 Unspecified atrial fibrillation: Secondary | ICD-10-CM

## 2016-03-28 DIAGNOSIS — Z952 Presence of prosthetic heart valve: Secondary | ICD-10-CM | POA: Insufficient documentation

## 2016-03-28 DIAGNOSIS — I5022 Chronic systolic (congestive) heart failure: Secondary | ICD-10-CM | POA: Insufficient documentation

## 2016-03-28 DIAGNOSIS — Z8249 Family history of ischemic heart disease and other diseases of the circulatory system: Secondary | ICD-10-CM | POA: Diagnosis not present

## 2016-03-28 DIAGNOSIS — I110 Hypertensive heart disease with heart failure: Secondary | ICD-10-CM | POA: Diagnosis not present

## 2016-03-28 DIAGNOSIS — Z87891 Personal history of nicotine dependence: Secondary | ICD-10-CM | POA: Insufficient documentation

## 2016-03-28 LAB — CBC
HCT: 39.1 % (ref 39.0–52.0)
Hemoglobin: 12 g/dL — ABNORMAL LOW (ref 13.0–17.0)
MCH: 26.4 pg (ref 26.0–34.0)
MCHC: 30.7 g/dL (ref 30.0–36.0)
MCV: 86.1 fL (ref 78.0–100.0)
Platelets: 224 10*3/uL (ref 150–400)
RBC: 4.54 MIL/uL (ref 4.22–5.81)
RDW: 15.6 % — ABNORMAL HIGH (ref 11.5–15.5)
WBC: 6.9 10*3/uL (ref 4.0–10.5)

## 2016-03-28 LAB — BASIC METABOLIC PANEL
Anion gap: 6 (ref 5–15)
BUN: 19 mg/dL (ref 6–20)
CO2: 27 mmol/L (ref 22–32)
Calcium: 9.3 mg/dL (ref 8.9–10.3)
Chloride: 108 mmol/L (ref 101–111)
Creatinine, Ser: 0.99 mg/dL (ref 0.61–1.24)
GFR calc Af Amer: >60 mL/min (ref >60)
GFR calc non Af Amer: >60 mL/min (ref >60)
Glucose, Bld: 117 mg/dL — ABNORMAL HIGH (ref 65–99)
Potassium: 4.9 mmol/L (ref 3.5–5.1)
Sodium: 141 mmol/L (ref 135–145)

## 2016-03-28 LAB — POCT INR: INR: 2.1

## 2016-03-28 MED ORDER — SPIRONOLACTONE 25 MG PO TABS: 25.0000 mg | ORAL_TABLET | Freq: Every day | ORAL | 3 refills | Status: DC

## 2016-03-28 NOTE — Progress Notes (Signed)
Advanced Heart Failure Medication Review by a Pharmacist  Does the patient  feel that his/her medications are working for him/her?  yes  Has the patient been experiencing any side effects to the medications prescribed?  no  Does the patient measure his/her own blood pressure or blood glucose at home?  no   Does the patient have any problems obtaining medications due to transportation or finances?   no  Understanding of regimen: good Understanding of indications: good Potential of compliance: fair Patient understands to avoid NSAIDs. Patient understands to avoid decongestants.  Issues to address at subsequent visits: None   Pharmacist comments:  [name] Hinton is a pleasant 45 yo M presenting without a medication list but with good recall of his regimen including dosages. He reports good compliance with his regimen but does admit to running out of his spironolactone about a week ago. He did not have any other medication-related questions or concerns for me at this time.   [name]. [name], PharmD, BCPS, CPP Clinical Pharmacist Pager: [phone] Phone: [phone] 03/28/2016 9:07 AM      Time with patient: 8 minutes Preparation and documentation time: 2 minutes Total time: 10 minutes

## 2016-03-28 NOTE — Patient Instructions (Signed)
Labs today  Labs every 4 months  Your physician has requested that you have an echocardiogram. Echocardiography is a painless test that uses sound waves to create images of your heart. It provides your doctor with information about the size and shape of your heart and how well your heart's chambers and valves are working. This procedure takes approximately one hour. There are no restrictions for this procedure.  We will contact you in 1 year to schedule your next appointment.

## 2016-03-29 NOTE — Progress Notes (Signed)
Patient ID: [name] Hinton, male   DOB: 07/04/1971, 45 y.o.   MRN: [id number] PCP: Dr. Ashby Dawes Cardiology: Dr. Aundra Dubin  [name] Hinton is a 45 year old with a history of alcohol abuse and former smoker (quit  2011). Hospitalized 1/15 with increased dyspnea-->ECHO performed and showed EF 25%, global hypokinesis, RV mildly dilated,severe aortic insufficiency.  Patient had new-onset acute systolic CHF and atrial flutter. Started on cardizem drip and Xarelto. Initially in A flutter--> became A fib.   He had TEE and DC-CV on 08/01/13.  Dr Roxy Manns provided consultation due to ascending aortic aneurysm and bicuspid valve with severe AI. He was discharged on ACEI, beta blocker, spironolactone, and xarelto. Discharge weight 150 pounds.    Had cough with lisinopril and switched to losartan.  LHC/RHC was done 2/15 showing no coronary disease and elevated PCWP with pulmonary hypertension.   In 3/15, he had replacement of ascending aortic and aortic valve with Bentall procedure with mechanical aortic valve.  He also had Maze procedure and LAA obliteration.  He was noted to have gone into atypical atrial flutter by the time of his 3/30 followup with Dr. Roxy Manns.  He was started on amiodarone and went back into NSR. He is back at work.  Echo in 4/15 showed normally functioning mechanical aortic valve, but EF remained 30-35%.   Echo in 4/15 showed normally functioning mechanical aortic valve, but EF remained 30-35%. ECHO 01/2014  EF up to 45% with normal mechanical aortic valve.  He has been doing well, has not been here in a couple of years.  He had an episode of transient monocular blindness about a year ago, otherwise no neurological symptoms.  No exertional dyspnea or chest pain.  No orthopnea/PND.  No BRBPR/melena. Working full time as a Freight forwarder at [employer].   Labs (1/15): K 5.2, creatinine 1.3, AST 40, ALT 58 Labs (2/15): K 4.7, creatinine 1.1 Labs (4/15): K 4.9, creatinine 1.0, LFTs normal, TSH normal Labs (5/15): K  4.6, creatinine 1.1, BNP 134 Labs (6/15): K 4.8  creatinine 1.0. INR 2.4 Labs (03/01/14): K 4.7 Creatinine 1.16  Labs (11/15): K 4.8, creatinine 0.79  ECG: NSR, nonspecific T wave flattening  PMH:  1. A fib/Aflutter: Initially flutter during 1/15 admission, degenerated to fibrillation --> S/P TEE DC-CV to NSR. Atypical atrial flutter recurred post-op in 3/15. 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  2. 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  TEE (1/15) with EF 25%, moderate LV dilation, bicuspid aortic valve with severe eccentric AI and no AS, ascending aorta 5.0 cm, moderately decreased RV systolic function.  CTA chest (1/15) with 5.1 cm ascending aortic aneurysm. Patient had Bentall replacement of ascending aorta with mechanical aortic valve and Maze with LAA obliteration in 3/15.  3. Cardiomyopathy with systolic CHF: As above, TEE with EF 25% and moderately dilated LV, moderately decreased RV systolic function.  Tachy-mediated CMP versus ETOH CMP versus long-standing AI versus a combination of the three.  LHC/RHC (2/15): no coronary disease, mean RA 5, PA 62/25, mean PCWP 22, CI 1.8, PVR 5.2 WU, LVEDP 35 (severe AI).  Echo (2/15): EF 35-40%, moderately dilated LV, bicuspid aortic valve with severe AI.  Echo (4/15) with mildly dilated LV, mild LVH, EF 30-35%, normal RV size and systolic function, mechanical aortic valve appeared to function normally, IVC small.  Echo (7/15) with EF 45%, normal mechanical aortic valve, normal RV size with mildly decreased systolic function.  4. Former smoker quit 2011  5. Alcohol abuse but has cut back considerably.  6. ACEI cough 7. Palpitations: Holter monitor (12/15) with occasional PVCs and PACs.   FH: Father- Heart Failure, Grandfather CAD CABG   SH: Quit smoking 2011. Heavy ETOH until 1/15.  Engaged.  Works as Freight forwarder at [employer].   ROS: All systems negative except as listed in HPI, PMH and  Problem List.  Current Outpatient Prescriptions  Medication Sig Dispense Refill  . aspirin EC 81 MG tablet Take 1 tablet (81 mg total) by mouth daily. 90 tablet 3  . carvedilol (COREG) 12.5 MG tablet TAKE 1 TABLET BY MOUTH TWICE DAILY 60 tablet 3  . losartan (COZAAR) 50 MG tablet TAKE 1&1/2 TABLET BY MOUTH TWICE DAILY 90 tablet 3  . warfarin (COUMADIN) 5 MG tablet TAKE AS DIRECTED BY COUMADIN CLINIC. 50 tablet 2  . spironolactone (ALDACTONE) 25 MG tablet Take 1 tablet (25 mg total) by mouth daily. 30 tablet 3   No current facility-administered medications for this encounter.      PHYSICAL EXAM: Vitals:   03/28/16 0901  BP: 130/86  BP Location: Left Arm  Patient Position: Sitting  Cuff Size: Normal  Pulse: 68  SpO2: 100%  Weight: 168 lb (76.2 kg)   General:  Well appearing. No resp difficulty. HEENT: normal Neck: supple. JVP flat. Carotids 2+ bilaterally; no bruits. No lymphadenopathy or thryomegaly appreciated. Cor: PMI normal. Regular rate & rhythm. Mechanical S2.  No rubs, gallops. 1/6 SEM RUSB. Lungs: clear Abdomen: soft, nontender, nondistended. No hepatosplenomegaly. No bruits or masses. Good bowel sounds. Extremities: no cyanosis, clubbing, rash, edema Neuro: alert & orientedx3, cranial nerves grossly intact. Moves all 4 extremities w/o difficulty. Affect pleasant.  ASSESSMENT & PLAN: 1. Chronic systolic CHF: Nonischemic cardiomyopathy.  Tachy-mediated CMP versus ETOH CMP versus long-standing aortic insufficiency or (most likely) a combination of all 3 possible causes.  He is no longer drinking and has had mechanical AVR.  Echo  01/2014 with EF 45%.    - Continue Coreg to 12.5 mg bid.  - Continue losartan to 75 mg bid. Intolerant ace due to cough.  - Continue spironolactone 25 mg daily.   - BMET today. I will put in a standing order for him to have BMET every 3 months.  - I will arrange for repeat echo.  2. Paroxysmal atrial fibrillation/flutter: s/p TEE/DCCV 08/01/13.  Had Maze/LAA obliteration with Bentall.  Post-op atypical flutter.  Remaining in SR. No longer on amiodarone.   3. Bicuspid aortic valve disorder with severe AI and severely dilated ascending aorta (5.1 cm on CTA).  Status post mechanical AVR + ascending aorta replacement.  The valve looked good on 7/15 echo. Episode of possible amaurosis fugax about a year ago.  He is now on ASA 81 mg daily (which will be continued) and on warfarin with goal INR 2.5-3.5. INR managed [facility].  - Reinforced need for SBE prophylaxis - CBC today.  4. ETOH abuse: Occasional ETOH, not heavy as in the past.   5. HTN: Stable. Continue current regimen.  If echo looks ok, he can followup in 1 year but will need to have q3 month BMETs given spironolactone use.   Cintya Daughety,MD 10:28 PM

## 2016-03-31 ENCOUNTER — Other Ambulatory Visit: Payer: Self-pay

## 2016-03-31 ENCOUNTER — Ambulatory Visit (HOSPITAL_COMMUNITY): Payer: 59 | Attending: Internal Medicine

## 2016-03-31 DIAGNOSIS — I351 Nonrheumatic aortic (valve) insufficiency: Secondary | ICD-10-CM | POA: Insufficient documentation

## 2016-03-31 DIAGNOSIS — I509 Heart failure, unspecified: Secondary | ICD-10-CM | POA: Diagnosis present

## 2016-03-31 DIAGNOSIS — I5022 Chronic systolic (congestive) heart failure: Secondary | ICD-10-CM | POA: Diagnosis not present

## 2016-03-31 DIAGNOSIS — I517 Cardiomegaly: Secondary | ICD-10-CM | POA: Diagnosis not present

## 2016-03-31 DIAGNOSIS — Z952 Presence of prosthetic heart valve: Secondary | ICD-10-CM | POA: Diagnosis not present

## 2016-04-16 ENCOUNTER — Encounter (HOSPITAL_COMMUNITY): Payer: Self-pay | Admitting: *Deleted

## 2016-04-17 ENCOUNTER — Ambulatory Visit (INDEPENDENT_AMBULATORY_CARE_PROVIDER_SITE_OTHER): Payer: 59

## 2016-04-17 DIAGNOSIS — I351 Nonrheumatic aortic (valve) insufficiency: Secondary | ICD-10-CM

## 2016-04-17 DIAGNOSIS — Z8679 Personal history of other diseases of the circulatory system: Secondary | ICD-10-CM | POA: Diagnosis not present

## 2016-04-17 DIAGNOSIS — I359 Nonrheumatic aortic valve disorder, unspecified: Secondary | ICD-10-CM

## 2016-04-17 DIAGNOSIS — Z9889 Other specified postprocedural states: Secondary | ICD-10-CM

## 2016-04-17 LAB — POCT INR: INR: 3.3

## 2016-04-23 ENCOUNTER — Other Ambulatory Visit (HOSPITAL_COMMUNITY): Payer: Self-pay | Admitting: Cardiology

## 2016-04-23 ENCOUNTER — Other Ambulatory Visit: Payer: Self-pay | Admitting: Internal Medicine

## 2016-04-23 ENCOUNTER — Other Ambulatory Visit (HOSPITAL_COMMUNITY): Payer: Self-pay | Admitting: Internal Medicine

## 2016-04-23 DIAGNOSIS — I509 Heart failure, unspecified: Secondary | ICD-10-CM

## 2016-04-23 DIAGNOSIS — I5022 Chronic systolic (congestive) heart failure: Secondary | ICD-10-CM

## 2016-05-15 ENCOUNTER — Ambulatory Visit (INDEPENDENT_AMBULATORY_CARE_PROVIDER_SITE_OTHER): Payer: 59 | Admitting: Pharmacist

## 2016-05-15 DIAGNOSIS — I359 Nonrheumatic aortic valve disorder, unspecified: Secondary | ICD-10-CM

## 2016-05-15 DIAGNOSIS — I351 Nonrheumatic aortic (valve) insufficiency: Secondary | ICD-10-CM | POA: Diagnosis not present

## 2016-05-15 DIAGNOSIS — Z9889 Other specified postprocedural states: Secondary | ICD-10-CM

## 2016-05-15 DIAGNOSIS — Z8679 Personal history of other diseases of the circulatory system: Secondary | ICD-10-CM | POA: Diagnosis not present

## 2016-05-15 LAB — POCT INR: INR: 3.2

## 2016-07-03 ENCOUNTER — Other Ambulatory Visit: Payer: Self-pay | Admitting: Cardiology

## 2016-07-04 ENCOUNTER — Ambulatory Visit (INDEPENDENT_AMBULATORY_CARE_PROVIDER_SITE_OTHER): Payer: 59 | Admitting: Pharmacist Clinician (PhC)/ Clinical Pharmacy Specialist

## 2016-07-04 DIAGNOSIS — Z9889 Other specified postprocedural states: Secondary | ICD-10-CM

## 2016-07-04 DIAGNOSIS — I359 Nonrheumatic aortic valve disorder, unspecified: Secondary | ICD-10-CM

## 2016-07-04 DIAGNOSIS — Z8679 Personal history of other diseases of the circulatory system: Secondary | ICD-10-CM | POA: Diagnosis not present

## 2016-07-04 DIAGNOSIS — I351 Nonrheumatic aortic (valve) insufficiency: Secondary | ICD-10-CM

## 2016-07-04 LAB — POCT INR: INR: 5.2

## 2016-07-28 ENCOUNTER — Other Ambulatory Visit (HOSPITAL_COMMUNITY): Payer: Self-pay | Admitting: *Deleted

## 2016-07-28 ENCOUNTER — Inpatient Hospital Stay (HOSPITAL_COMMUNITY): Admission: RE | Admit: 2016-07-28 | Payer: Self-pay | Source: Ambulatory Visit

## 2016-07-28 DIAGNOSIS — I509 Heart failure, unspecified: Secondary | ICD-10-CM

## 2016-09-01 ENCOUNTER — Other Ambulatory Visit (HOSPITAL_COMMUNITY): Payer: Self-pay | Admitting: Pharmacist

## 2016-09-14 ENCOUNTER — Other Ambulatory Visit: Payer: Self-pay | Admitting: Cardiology

## 2016-09-15 ENCOUNTER — Other Ambulatory Visit (HOSPITAL_COMMUNITY): Payer: Self-pay | Admitting: Cardiology

## 2016-09-15 MED ORDER — SPIRONOLACTONE 25 MG PO TABS: 25.0000 mg | ORAL_TABLET | Freq: Every day | ORAL | 3 refills | Status: DC

## 2016-09-17 ENCOUNTER — Ambulatory Visit (INDEPENDENT_AMBULATORY_CARE_PROVIDER_SITE_OTHER): Payer: 59 | Admitting: *Deleted

## 2016-09-17 DIAGNOSIS — Z8679 Personal history of other diseases of the circulatory system: Secondary | ICD-10-CM

## 2016-09-17 DIAGNOSIS — I359 Nonrheumatic aortic valve disorder, unspecified: Secondary | ICD-10-CM | POA: Diagnosis not present

## 2016-09-17 DIAGNOSIS — Z9889 Other specified postprocedural states: Secondary | ICD-10-CM

## 2016-09-17 DIAGNOSIS — I351 Nonrheumatic aortic (valve) insufficiency: Secondary | ICD-10-CM

## 2016-09-17 LAB — POCT INR: INR: 3.4

## 2016-10-04 ENCOUNTER — Encounter (HOSPITAL_COMMUNITY): Payer: Self-pay | Admitting: Emergency Medicine

## 2016-10-04 ENCOUNTER — Inpatient Hospital Stay (HOSPITAL_COMMUNITY)
Admission: EM | Admit: 2016-10-04 | Discharge: 2016-10-06 | DRG: 378 | Disposition: A | Discharge status: Home / Self Care | Payer: 59 | Attending: Internal Medicine | Admitting: Internal Medicine

## 2016-10-04 ENCOUNTER — Emergency Department (HOSPITAL_COMMUNITY): Payer: 59

## 2016-10-04 DIAGNOSIS — I480 Paroxysmal atrial fibrillation: Secondary | ICD-10-CM | POA: Diagnosis not present

## 2016-10-04 DIAGNOSIS — D500 Iron deficiency anemia secondary to blood loss (chronic): Secondary | ICD-10-CM | POA: Diagnosis present

## 2016-10-04 DIAGNOSIS — Z952 Presence of prosthetic heart valve: Secondary | ICD-10-CM

## 2016-10-04 DIAGNOSIS — Z87891 Personal history of nicotine dependence: Secondary | ICD-10-CM

## 2016-10-04 DIAGNOSIS — I272 Pulmonary hypertension, unspecified: Secondary | ICD-10-CM | POA: Diagnosis present

## 2016-10-04 DIAGNOSIS — Z8673 Personal history of transient ischemic attack (TIA), and cerebral infarction without residual deficits: Secondary | ICD-10-CM

## 2016-10-04 DIAGNOSIS — Z954 Presence of other heart-valve replacement: Secondary | ICD-10-CM

## 2016-10-04 DIAGNOSIS — D62 Acute posthemorrhagic anemia: Secondary | ICD-10-CM | POA: Diagnosis present

## 2016-10-04 DIAGNOSIS — I4892 Unspecified atrial flutter: Secondary | ICD-10-CM | POA: Diagnosis present

## 2016-10-04 DIAGNOSIS — K922 Gastrointestinal hemorrhage, unspecified: Secondary | ICD-10-CM

## 2016-10-04 DIAGNOSIS — I5022 Chronic systolic (congestive) heart failure: Secondary | ICD-10-CM | POA: Diagnosis present

## 2016-10-04 DIAGNOSIS — Z8249 Family history of ischemic heart disease and other diseases of the circulatory system: Secondary | ICD-10-CM

## 2016-10-04 DIAGNOSIS — D649 Anemia, unspecified: Secondary | ICD-10-CM | POA: Diagnosis present

## 2016-10-04 DIAGNOSIS — Z885 Allergy status to narcotic agent status: Secondary | ICD-10-CM

## 2016-10-04 DIAGNOSIS — K921 Melena: Principal | ICD-10-CM | POA: Diagnosis present

## 2016-10-04 DIAGNOSIS — I428 Other cardiomyopathies: Secondary | ICD-10-CM | POA: Diagnosis present

## 2016-10-04 LAB — I-STAT TROPONIN, ED: Troponin i, poc: 0.01 ng/mL (ref 0.00–0.08)

## 2016-10-04 LAB — BASIC METABOLIC PANEL
Anion gap: 9 (ref 5–15)
BUN: 15 mg/dL (ref 6–20)
CO2: 27 mmol/L (ref 22–32)
Calcium: 9 mg/dL (ref 8.9–10.3)
Chloride: 100 mmol/L — ABNORMAL LOW (ref 101–111)
Creatinine, Ser: 1 mg/dL (ref 0.61–1.24)
GFR calc Af Amer: >60 mL/min (ref >60)
GFR calc non Af Amer: >60 mL/min (ref >60)
Glucose, Bld: 104 mg/dL — ABNORMAL HIGH (ref 65–99)
Potassium: 4.9 mmol/L (ref 3.5–5.1)
Sodium: 136 mmol/L (ref 135–145)

## 2016-10-04 LAB — CBC
HCT: 21.5 % — ABNORMAL LOW (ref 39.0–52.0)
Hemoglobin: 6.1 g/dL — CL (ref 13.0–17.0)
MCH: 19.6 pg — ABNORMAL LOW (ref 26.0–34.0)
MCHC: 28.4 g/dL — ABNORMAL LOW (ref 30.0–36.0)
MCV: 68.9 fL — ABNORMAL LOW (ref 78.0–100.0)
Platelets: 268 10*3/uL (ref 150–400)
RBC: 3.12 MIL/uL — ABNORMAL LOW (ref 4.22–5.81)
RDW: 16.2 % — ABNORMAL HIGH (ref 11.5–15.5)
WBC: 6.7 10*3/uL (ref 4.0–10.5)

## 2016-10-04 LAB — POC OCCULT BLOOD, ED: Fecal Occult Bld: POSITIVE — AB

## 2016-10-04 LAB — PROTIME-INR
INR: 2.19
Prothrombin Time: 24.7 seconds — ABNORMAL HIGH (ref 11.4–15.2)

## 2016-10-04 LAB — PREPARE RBC (CROSSMATCH)

## 2016-10-04 MED ORDER — LOSARTAN POTASSIUM 50 MG PO TABS
75.0000 mg | ORAL_TABLET | Freq: Every day | ORAL | Status: DC
  Administered 2016-10-05 – 2016-10-06 (×2): 75 mg via ORAL
  Filled 2016-10-04 (×2): qty 1

## 2016-10-04 MED ORDER — CARVEDILOL 12.5 MG PO TABS
12.5000 mg | ORAL_TABLET | Freq: Two times a day (BID) | ORAL | Status: DC
  Administered 2016-10-05 – 2016-10-06 (×4): 12.5 mg via ORAL
  Filled 2016-10-04 (×4): qty 1

## 2016-10-04 MED ORDER — SODIUM CHLORIDE 0.9 % IV SOLN
Freq: Once | INTRAVENOUS | Status: AC
  Administered 2016-10-04: 20:00:00 via INTRAVENOUS

## 2016-10-04 MED ORDER — SPIRONOLACTONE 25 MG PO TABS
25.0000 mg | ORAL_TABLET | Freq: Every day | ORAL | Status: DC
  Administered 2016-10-05 – 2016-10-06 (×2): 25 mg via ORAL
  Filled 2016-10-04 (×2): qty 1

## 2016-10-04 MED ORDER — ASPIRIN EC 81 MG PO TBEC
81.0000 mg | DELAYED_RELEASE_TABLET | Freq: Every day | ORAL | Status: DC
  Administered 2016-10-05 – 2016-10-06 (×2): 81 mg via ORAL
  Filled 2016-10-04 (×2): qty 1

## 2016-10-04 NOTE — ED Provider Notes (Signed)
[facility] DEPT Provider Note   CSN: [id number] Arrival date & time: 10/04/16  1734     History   Chief Complaint Chief Complaint  Patient presents with  . Chest Pain  . Dizziness    HPI [name] Hinton is a 46 y.o. male.  46 yo M with a cc of weakness, dizziness.  Going on for past couple of weeks.  Patient has had bright red blood in his stool off and on for quite some time. He denies worsening recently. Denies diarrhea. Denies abdominal pain. Denies fevers. Patient has a history of aortic valve replacement and is on chronic Coumadin therapy.   The history is provided by the patient.  Chest Pain   Associated symptoms include dizziness. Pertinent negatives include no abdominal pain, no fever, no headaches, no palpitations, no shortness of breath and no vomiting.  Dizziness  Associated symptoms: blood in stool   Associated symptoms: no chest pain, no diarrhea, no headaches, no palpitations, no shortness of breath and no vomiting   Illness  This is a new problem. The current episode started more than 1 week ago. The problem occurs constantly. The problem has been gradually worsening. Pertinent negatives include no chest pain, no abdominal pain, no headaches and no shortness of breath. Nothing aggravates the symptoms. Nothing relieves the symptoms. He has tried nothing for the symptoms. The treatment provided no relief.    Past Medical History:  Diagnosis Date  . Dysrhythmia   . ETOH abuse   . Heart murmur   . 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 Non-ischemic cardiomyopathy ([place])   . PAF (paroxysmal atrial fibrillation) ([place])    a. Dx 07/2013, s/p TEE/DCCV.  . Paroxysmal atrial flutter ([place])    a. Dx 07/2013.  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 S/P Maze operation for atrial fibrillation 09/27/2013   Complete bilateral atrial  lesion set using bipolar radiofrequency and cryothermy ablation with clipping of LA appendage  . Severe aortic insufficiency    a. Bicuspid aortic valve with severe AI, dilated ascending aorta dx 07/2013.  [name] Systolic CHF ([place])    a. Dx 07/2013: EF 25%, felt likely related to combo of EtOH + tachy-mediated + AI. LHC pending.    Patient Active Problem List   Diagnosis Date Noted  . Symptomatic anemia 10/04/2016  . Amaurosis fugax 10/29/2013  . Aortic valve disorders 10/05/2013  . 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 S/P Maze operation for atrial fibrillation 09/27/2013  . 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 Non-ischemic cardiomyopathy ([place])   . Chronic systolic CHF (congestive heart failure) ([place]) 08/10/2013  . Bicuspid aortic valve 08/10/2013  . AF (paroxysmal atrial fibrillation) ([place]) 08/10/2013    Past Surgical History:  Procedure Laterality Date  . ASCENDING AORTIC ROOT REPLACEMENT N/A 09/27/2013   Procedure: ASCENDING AORTIC ROOT REPLACEMENT;  Surgeon: [name], MD;  Location: [facility];  Service: Open Heart Surgery;  Laterality: N/A;  . BENTALL PROCEDURE N/A 09/27/2013   Procedure: BENTALL PROCEDURE;  Surgeon: [name], MD;  Location: [facility];  Service: Open Heart Surgery;  Laterality: N/A;  . CARDIAC CATHETERIZATION    . CARDIOVERSION N/A 08/01/2013   Procedure: CARDIOVERSION;  Surgeon: [name], MD;  Location: [facility];  Service: Cardiovascular;  Laterality: N/A;  . INTRAOPERATIVE  TRANSESOPHAGEAL ECHOCARDIOGRAM N/A 09/27/2013   Procedure: INTRAOPERATIVE TRANSESOPHAGEAL ECHOCARDIOGRAM;  Surgeon: [name], MD;  Location: [facility];  Service: Open Heart Surgery;  Laterality: N/A;  . LEFT AND RIGHT HEART CATHETERIZATION WITH CORONARY ANGIOGRAM N/A 09/05/2013   Procedure: LEFT AND RIGHT HEART CATHETERIZATION WITH CORONARY ANGIOGRAM;  Surgeon: [name], MD;  Location: [facility] CATH LAB;  Service:  Cardiovascular;  Laterality: N/A;  . MAZE N/A 09/27/2013   Procedure: MAZE;  Surgeon: [name], MD;  Location: [facility];  Service: Open Heart Surgery;  Laterality: N/A;  . NO PAST SURGERIES    . TEE WITHOUT CARDIOVERSION N/A 08/01/2013   Procedure: TRANSESOPHAGEAL ECHOCARDIOGRAM (TEE);  Surgeon: [name], MD;  Location: [facility];  Service: Cardiovascular;  Laterality: N/A;       Home Medications    Prior to Admission medications   Medication Sig Start Date End Date Taking? Authorizing Provider  aspirin EC 81 MG tablet Take 1 tablet (81 mg total) by mouth daily. 10/27/13  Yes [name], MD  carvedilol (COREG) 12.5 MG tablet TAKE 1 TABLET BY MOUTH TWICE DAILY 04/23/16  Yes Jolaine Artist, MD  losartan (COZAAR) 50 MG tablet Take 75 mg by mouth daily.   Yes Historical Provider, MD  spironolactone (ALDACTONE) 25 MG tablet Take 1 tablet (25 mg total) by mouth daily. 09/15/16  Yes Jolaine Artist, MD  warfarin (COUMADIN) 5 MG tablet TAKE AS DIRECTED BY COUMADIN CLINIC 09/17/16  Yes [name], MD    Family History Family History  Problem Relation Age of Onset  . Heart failure Father   . Coronary artery disease Father     CABG    Social History Social History  Substance Use Topics  . Smoking status: Former Smoker    Packs/day: 1.50    Years: 20.00    Quit date: 09/25/2009  . Smokeless tobacco: Never Used  . Alcohol use Yes     Comment: 25 drinks/week - occasional in 2 months.only 1 drink in past 2 months-09/23/13     Allergies   Morphine and related   Review of Systems Review of Systems  Constitutional: Negative for chills and fever.  HENT: Negative for congestion and facial swelling.   Eyes: Negative for discharge and visual disturbance.  Respiratory: Negative for shortness of breath.   Cardiovascular: Negative for chest pain and palpitations.  Gastrointestinal: Positive for blood in stool. Negative for abdominal pain, diarrhea and vomiting.    Musculoskeletal: Negative for arthralgias and myalgias.  Skin: Negative for color change and rash.  Neurological: Positive for dizziness. Negative for tremors, syncope and headaches.  Psychiatric/Behavioral: Negative for confusion and dysphoric mood.     Physical Exam Updated Vital Signs BP 137/88 (BP Location: Left Arm)   Pulse 60   Temp 98.6 F (37 C) (Oral)   Resp 16   Ht 5\' 7"  (1.702 m)   SpO2 99%   Physical Exam  Constitutional: He is oriented to person, place, and time. He appears well-developed and well-nourished.  Pallor on exam  HENT:  Head: Normocephalic and atraumatic.  Eyes: EOM are normal. Pupils are equal, round, and reactive to light.  Neck: Normal range of motion. Neck supple. No JVD present.  Cardiovascular: Normal rate and regular rhythm.  Exam reveals no gallop and no friction rub.   No murmur heard. Pulmonary/Chest: No respiratory distress. He has no wheezes.  Abdominal: He exhibits no distension and no mass. There is no tenderness. There is no  rebound and no guarding.  Genitourinary:  Genitourinary Comments: No stool in the vault. No blood was noted. Fecal occult testing was positive.  Musculoskeletal: Normal range of motion.  Neurological: He is alert and oriented to person, place, and time.  Skin: No rash noted. No pallor.  Psychiatric: He has a normal mood and affect. His behavior is normal.  Nursing note and vitals reviewed.    ED Treatments / Results  Labs (all labs ordered are listed, but only abnormal results are displayed) Labs Reviewed  BASIC METABOLIC PANEL - Abnormal; Notable for the following:       Result Value   Chloride 100 (*)    Glucose, Bld 104 (*)    All other components within normal limits  CBC - Abnormal; Notable for the following:    RBC 3.12 (*)    Hemoglobin 6.1 (*)    HCT 21.5 (*)    MCV 68.9 (*)    MCH 19.6 (*)    MCHC 28.4 (*)    RDW 16.2 (*)    All other components within normal limits  PROTIME-INR - Abnormal;  Notable for the following:    Prothrombin Time 24.7 (*)    All other components within normal limits  POC OCCULT BLOOD, ED - Abnormal; Notable for the following:    Fecal Occult Bld POSITIVE (*)    All other components within normal limits  I-STAT TROPOININ, ED  TYPE AND SCREEN  PREPARE RBC (CROSSMATCH)    EKG  EKG Interpretation  Date/Time:  Saturday October 04 2016 17:40:22 EDT Ventricular Rate:  64 PR Interval:  170 QRS Duration: 94 QT Interval:  418 QTC Calculation: 431 R Axis:   70 Text Interpretation:  Normal sinus rhythm Nonspecific ST and T wave abnormality Abnormal ECG No significant change since last tracing Confirmed by Breezie Micucci MD, DANIEL [phone]) on 10/04/2016 8:10:51 PM       Radiology Dg Chest 2 View  Result Date: 10/04/2016 CLINICAL DATA:  Fatigue dizziness and lightheadedness. EXAM: CHEST  2 VIEW COMPARISON:  10/17/2013 FINDINGS: Postsurgical changes from aortic valve replacement and left atrial appendage clamp are stable. The cardiac silhouette is enlarged, stable. Mediastinal contours appear intact. Tortuosity of the aorta. There is no evidence of focal airspace consolidation, pleural effusion or pneumothorax. Osseous structures are without acute abnormality. Soft tissues are grossly normal. IMPRESSION: Enlarged cardiac silhouette which may represent cardiomegaly or pericardial effusion. No pulmonary edema or airspace consolidation. Electronically Signed   By: Fidela Salisbury M.D.   On: 10/04/2016 18:32    Procedures Procedures (including critical care time)  Medications Ordered in ED Medications  0.9 %  sodium chloride infusion ( Intravenous New Bag/Given 10/04/16 1932)     Initial Impression / Assessment and Plan / ED Course  I have reviewed the triage vital signs and the nursing notes.  Pertinent labs & imaging results that were available during my care of the patient were reviewed by me and considered in my medical decision making (see chart for  details).     46 yo M With a chief complaints of symptomatic anemia. Patient found to have a hemoglobin of 6.1. With a history of bright red blood per rectum suspect lower GI bleed. With course of this since possible patient has inflammatory bowel disease. I discussed the case with Dr. Paulita Fujita, gastroenterology. He recommended optimizing the INR and he will see patient in the morning and decide upon further intervention. Admit.  CRITICAL CARE Performed by: Cecilio Asper  Total critical care time: 45 minutes  Critical care time was exclusive of separately billable procedures and treating other patients.  Critical care was necessary to treat or prevent imminent or life-threatening deterioration.  Critical care was time spent personally by me on the following activities: development of treatment plan with patient and/or surrogate as well as nursing, discussions with consultants, evaluation of patient's response to treatment, examination of patient, obtaining history from patient or surrogate, ordering and performing treatments and interventions, ordering and review of laboratory studies, ordering and review of radiographic studies, pulse oximetry and re-evaluation of patient's condition.  The patients results and plan were reviewed and discussed.   Any x-rays performed were independently reviewed by myself.   Differential diagnosis were considered with the presenting HPI.  Medications  0.9 %  sodium chloride infusion ( Intravenous New Bag/Given 10/04/16 1932)    Vitals:   10/04/16 1900 10/04/16 2100 10/04/16 2114 10/04/16 2144  BP: 125/80 (!) 143/88 134/84 137/88  Pulse: 61 62 60 60  Resp: (!) 22 (!) 24 16 16   Temp:   99.1 F (37.3 C) 98.6 F (37 C)  TempSrc:   Oral Oral  SpO2: 100% 100%  99%  Height:        Final diagnoses:  Lower GI bleed  Symptomatic anemia    Admission/ observation were discussed with the admitting physician, patient and/or family and they are  comfortable with the plan.    Final Clinical Impressions(s) / ED Diagnoses   Final diagnoses:  Lower GI bleed  Symptomatic anemia    New Prescriptions New Prescriptions   No medications on file     Deno Etienne, DO 10/04/16 2146

## 2016-10-04 NOTE — ED Triage Notes (Signed)
Pt states over the last month he has been having some moments of chest pain with dizziness and sob. Today at work pt became dizzy and lightheaded and felt like he was going passed out. Pt states he got tight in his chest and had some sob. Pt denies any pain at this time. Pt is pale in triage.

## 2016-10-04 NOTE — H&P (Signed)
History and Physical  Patient Name: [name] Hinton     HQI:[id number]    DOB: [birth year]    DOA: 10/04/2016 PCP: Merrilee Seashore, MD   Patient coming from: Home  Chief Complaint: Weakness  HPI: [name] Hinton is a 46 y.o. 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  Over the last month, the patient has noticed slowly progressive intermittent usually exertional shortness of breath and dizziness. Today he was at work, and had an episode of dizziness, lightheadedness, and shortness of breath that was worse than previous and so he came to the ER. He has had no orthopnea, paroxysmal nocturnal dyspnea, leg swelling. He had chest tightness with the episode today, but otherwise the episodes been without chest discomfort.  Of note, the patient tells me that he has had bright red blood with every bowel movement for probably the last 10 years. He has never had black and sticky bowel movements, does not use NSAIDs, but when he has a bowel movement, "every bowel movement", there is "cherry red blood in the toilet bowl". He had assumed this was just his normal, has never had problems with anemia in the past, so he never brought it to the attention of his cardiologist.  ED course: -Afebrile, heart rate 63, respirations and pulse ox normal, blood pressure 144/82 -Na 136, K 4.9, Cr 1.0, WBC 6.7K, Hgb 6.1, microcytic, basleine 10-12 -INR 2.19 -CXR clear -ECG shows sinus rhythm, no change from previous -Rectal exam was without stool or blood or external hemorrhoids, but FOBT was positive -Troponin negative      ROS: Review of Systems  Constitutional: Negative for chills, diaphoresis and fever.  Respiratory: Negative for cough, hemoptysis and sputum production.   Cardiovascular: Negative for chest pain,  orthopnea, leg swelling and PND.  Gastrointestinal: Positive for blood in stool. Negative for abdominal pain, melena, nausea and vomiting.  All other systems reviewed and are negative.         Past Medical History:  Diagnosis Date  . Dysrhythmia   . ETOH abuse   . Heart murmur   . 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 Non-ischemic cardiomyopathy ([place])   . PAF (paroxysmal atrial fibrillation) ([place])    a. Dx 07/2013, s/p TEE/DCCV.  . Paroxysmal atrial flutter ([place])    a. Dx 07/2013.  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 S/P Maze operation for atrial fibrillation 09/27/2013   Complete bilateral atrial lesion set using bipolar radiofrequency and cryothermy ablation with clipping of LA appendage  . Severe aortic insufficiency    a. Bicuspid aortic valve with severe AI, dilated ascending aorta dx 07/2013.  [name] Systolic CHF ([place])    a. Dx 07/2013: EF 25%, felt likely related to combo of EtOH + tachy-mediated + AI. LHC pending.    Past Surgical History:  Procedure Laterality Date  . ASCENDING AORTIC ROOT REPLACEMENT N/A 09/27/2013   Procedure: ASCENDING AORTIC ROOT REPLACEMENT;  Surgeon: [name], MD;  Location: [facility];  Service: Open Heart Surgery;  Laterality: N/A;  . BENTALL PROCEDURE N/A 09/27/2013   Procedure: BENTALL PROCEDURE;  Surgeon: [name], MD;  Location: [facility];  Service: Open Heart Surgery;  Laterality:  N/A;  . CARDIAC CATHETERIZATION    . CARDIOVERSION N/A 08/01/2013   Procedure: CARDIOVERSION;  Surgeon: [name], MD;  Location: [facility];  Service: Cardiovascular;  Laterality: N/A;  . INTRAOPERATIVE TRANSESOPHAGEAL ECHOCARDIOGRAM N/A 09/27/2013   Procedure: INTRAOPERATIVE TRANSESOPHAGEAL ECHOCARDIOGRAM;  Surgeon: [name], MD;  Location: [facility];  Service: Open Heart Surgery;  Laterality: N/A;  . LEFT AND RIGHT  HEART CATHETERIZATION WITH CORONARY ANGIOGRAM N/A 09/05/2013   Procedure: LEFT AND RIGHT HEART CATHETERIZATION WITH CORONARY ANGIOGRAM;  Surgeon: [name], MD;  Location: [facility] CATH LAB;  Service: Cardiovascular;  Laterality: N/A;  . MAZE N/A 09/27/2013   Procedure: MAZE;  Surgeon: [name], MD;  Location: [place];  Service: Open Heart Surgery;  Laterality: N/A;  . NO PAST SURGERIES    . TEE WITHOUT CARDIOVERSION N/A 08/01/2013   Procedure: TRANSESOPHAGEAL ECHOCARDIOGRAM (TEE);  Surgeon: [name], MD;  Location: [place];  Service: Cardiovascular;  Laterality: N/A;    Social History: Patient lives with his wife.  The patient walks unassisted. 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  He is a former smoker.  He is from [place], went to [place].  Allergies  Allergen Reactions  . Morphine And Related Nausea Only    Family history: family history includes Coronary artery disease in his father; Heart failure in his father.  Prior to Admission medications   Medication Sig Start Date End Date Taking? Authorizing Provider  aspirin EC 81 MG tablet Take 1 tablet (81 mg total) by mouth daily. 10/27/13  Yes [name], MD  carvedilol (COREG) 12.5 MG tablet TAKE 1 TABLET BY MOUTH TWICE DAILY 04/23/16  Yes Jolaine Artist, MD  losartan (COZAAR) 50 MG tablet Take 75 mg by mouth daily.   Yes Historical Provider, MD  spironolactone (ALDACTONE) 25 MG tablet Take 1 tablet (25 mg total) by mouth daily. 09/15/16  Yes Jolaine Artist, MD  warfarin (COUMADIN) 5 MG tablet TAKE AS DIRECTED BY COUMADIN CLINIC 09/17/16  Yes [name], MD       Physical Exam: BP 125/80   Pulse 61   Temp 98.3 F (36.8 C) (Oral)   Resp (!) 22   Ht 5\' 7"  (1.702 m)   SpO2 100%  General appearance: Well-developed, adult male, alert and in no acute distress.   Eyes: Anicteric, conjunctiva pink, lids and lashes normal. PERRL.    ENT: No nasal deformity, discharge, epistaxis.  Hearing normal. OP moist  without lesions.   Neck: No neck masses.  Trachea midline.  No thyromegaly/tenderness. Lymph: No cervical or supraclavicular lymphadenopathy. Skin: Warm and dry.  Pale.  No jaundice.  No suspicious rashes or lesions. Cardiac: RRR, mechanical click audible without stethoscope.  Capillary refill is brisk.  JVP normal.  No LE edema.  Radial and DP pulses 2+ and symmetric. Respiratory: Normal respiratory rate and rhythm.  CTAB without rales or wheezes. Abdomen: Abdomen soft.  No TTP. No ascites, distension, hepatosplenomegaly.   MSK: No deformities or effusions.  No cyanosis or clubbing. Neuro: Cranial nerves normal.  Sensation intact to light touch. Speech is fluent.  Muscle strength normal.    Psych: Sensorium intact and responding to questions, attention normal.  Behavior appropriate.  Affect normal.  Judgment and insight appear normal.     Labs on Admission:  I have personally reviewed following labs and imaging studies: CBC:  Recent Labs Lab 10/04/16 1751  WBC 6.7  HGB 6.1*  HCT 21.5*  MCV 68.9*  PLT 637   Basic Metabolic Panel:  Recent Labs Lab 10/04/16 1751  NA 136  K 4.9  CL 100*  CO2 27  GLUCOSE 104*  BUN 15  CREATININE 1.00  CALCIUM 9.0   GFR: CrCl cannot be calculated (Unknown ideal weight.).  Liver Function Tests: No results for input(s): AST, ALT, ALKPHOS, BILITOT, PROT, ALBUMIN in the last 168 hours. No results for input(s): LIPASE, AMYLASE in the last 168 hours. No results for input(s): AMMONIA in the last 168 hours. Coagulation Profile:  Recent Labs Lab 10/04/16 1905  INR 2.19   Cardiac Enzymes: No results for input(s): CKTOTAL, CKMB, CKMBINDEX, TROPONINI in the last 168 hours. BNP (last 3 results) No results for input(s): PROBNP in the last 8760 hours. HbA1C: No results for input(s): HGBA1C in the last 72 hours. CBG: No results for input(s): GLUCAP in the last 168 hours. Lipid Profile: No results for input(s): CHOL, HDL, LDLCALC, TRIG,  CHOLHDL, LDLDIRECT in the last 72 hours. Thyroid Function Tests: No results for input(s): TSH, T4TOTAL, FREET4, T3FREE, THYROIDAB in the last 72 hours. Anemia Panel: No results for input(s): VITAMINB12, FOLATE, FERRITIN, TIBC, IRON, RETICCTPCT in the last 72 hours. Sepsis Labs: Invalid input(s): PROCALCITONIN, LACTICIDVEN No results found for this or any previous visit (from the past 240 hour(s)).       Radiological Exams on Admission: Personally reviewed CXR shows no focal opacity or edema: Dg Chest 2 View  Result Date: 10/04/2016 CLINICAL DATA:  Fatigue dizziness and lightheadedness. EXAM: CHEST  2 VIEW COMPARISON:  10/17/2013 FINDINGS: Postsurgical changes from aortic valve replacement and left atrial appendage clamp are stable. The cardiac silhouette is enlarged, stable. Mediastinal contours appear intact. Tortuosity of the aorta. There is no evidence of focal airspace consolidation, pleural effusion or pneumothorax. Osseous structures are without acute abnormality. Soft tissues are grossly normal. IMPRESSION: Enlarged cardiac silhouette which may represent cardiomegaly or pericardial effusion. No pulmonary edema or airspace consolidation. Electronically Signed   By: Fidela Salisbury M.D.   On: 10/04/2016 18:32    EKG: Independently reviewed. Rate 64, QTc normal, sinus rhythm.  Echocardiogram 03/2016 report reviewed: EF 55-60%        Assessment/Plan  1. Symptomatic anemia:  Suspect lower source. Likely hemorrhoids given chronicity?   -Clears only -Consult to GI, appreciate cares -Discussed with Cardiology by phone, will hold warfarin for now, anticipate endoscopy if able within 48 hours and then resume -Check iron stores, reticulocytes   2. CHF:  Chronic sytolic nonischemic CHF.  Previous EF as low as 25%. Most recent EF 55% and followed in heart failure clinic. Well compensated at present. -Continue ARB, spironolactone, beta blocker, aspirin  3. Atrial fibrillation:    Paroxysmal, CHADS2-VASc 1. -Continue beta blocker  4. Mechanical valve:  -Hold warfarin for now        DVT prophylaxis: On warfarin  Code Status: FULL  Family Communication: None present  Disposition Plan: Anticipate transfusion, GI consultation.  Furhter disposition per GI. Consults called: GI Admission status: OBS At the point of initial evaluation, it is my clinical opinion that admission for OBSERVATION is reasonable and necessary because the patient's presenting complaints in the context of their chronic conditions represent sufficient risk of deterioration or significant morbidity to constitute reasonable grounds for close observation in the hospital setting, but that the patient may be medically stable for discharge from the hospital within 24 to 48 hours.    Medical decision making: Patient seen at 9:07 PM on 10/04/2016.  The patient was  discussed with Dr. Paulita Fujita, Dr. Tyrone Nine.  What exists of the patient's chart was reviewed in depth and summarized above.  Clinical condition: stable.        Edwin Dada [facility] Pager [phone]

## 2016-10-05 ENCOUNTER — Encounter (HOSPITAL_COMMUNITY): Payer: Self-pay | Admitting: Physician Assistant

## 2016-10-05 ENCOUNTER — Other Ambulatory Visit: Payer: Self-pay | Admitting: Physician Assistant

## 2016-10-05 DIAGNOSIS — Z952 Presence of prosthetic heart valve: Secondary | ICD-10-CM | POA: Diagnosis not present

## 2016-10-05 DIAGNOSIS — D649 Anemia, unspecified: Secondary | ICD-10-CM | POA: Diagnosis not present

## 2016-10-05 DIAGNOSIS — Z8249 Family history of ischemic heart disease and other diseases of the circulatory system: Secondary | ICD-10-CM | POA: Diagnosis not present

## 2016-10-05 DIAGNOSIS — D500 Iron deficiency anemia secondary to blood loss (chronic): Secondary | ICD-10-CM | POA: Diagnosis present

## 2016-10-05 DIAGNOSIS — Z87891 Personal history of nicotine dependence: Secondary | ICD-10-CM | POA: Diagnosis not present

## 2016-10-05 DIAGNOSIS — D62 Acute posthemorrhagic anemia: Secondary | ICD-10-CM | POA: Diagnosis present

## 2016-10-05 DIAGNOSIS — K922 Gastrointestinal hemorrhage, unspecified: Secondary | ICD-10-CM | POA: Diagnosis present

## 2016-10-05 DIAGNOSIS — Z954 Presence of other heart-valve replacement: Secondary | ICD-10-CM | POA: Diagnosis not present

## 2016-10-05 DIAGNOSIS — Z885 Allergy status to narcotic agent status: Secondary | ICD-10-CM | POA: Diagnosis not present

## 2016-10-05 DIAGNOSIS — I480 Paroxysmal atrial fibrillation: Secondary | ICD-10-CM

## 2016-10-05 DIAGNOSIS — I272 Pulmonary hypertension, unspecified: Secondary | ICD-10-CM | POA: Diagnosis present

## 2016-10-05 DIAGNOSIS — I4892 Unspecified atrial flutter: Secondary | ICD-10-CM | POA: Diagnosis present

## 2016-10-05 DIAGNOSIS — I5022 Chronic systolic (congestive) heart failure: Secondary | ICD-10-CM | POA: Diagnosis present

## 2016-10-05 DIAGNOSIS — Z8673 Personal history of transient ischemic attack (TIA), and cerebral infarction without residual deficits: Secondary | ICD-10-CM | POA: Diagnosis not present

## 2016-10-05 DIAGNOSIS — K921 Melena: Secondary | ICD-10-CM | POA: Diagnosis present

## 2016-10-05 DIAGNOSIS — I428 Other cardiomyopathies: Secondary | ICD-10-CM | POA: Diagnosis present

## 2016-10-05 LAB — CBC
HCT: 27.6 % — ABNORMAL LOW (ref 39.0–52.0)
Hemoglobin: 8.3 g/dL — ABNORMAL LOW (ref 13.0–17.0)
MCH: 21.4 pg — ABNORMAL LOW (ref 26.0–34.0)
MCHC: 30.1 g/dL (ref 30.0–36.0)
MCV: 71.1 fL — ABNORMAL LOW (ref 78.0–100.0)
Platelets: 250 10*3/uL (ref 150–400)
RBC: 3.88 MIL/uL — ABNORMAL LOW (ref 4.22–5.81)
RDW: 16.7 % — ABNORMAL HIGH (ref 11.5–15.5)
WBC: 6.8 10*3/uL (ref 4.0–10.5)

## 2016-10-05 LAB — IRON AND TIBC
Iron: 24 ug/dL — ABNORMAL LOW (ref 45–182)
Saturation Ratios: 4 % — ABNORMAL LOW (ref 17.9–39.5)
TIBC: 533 ug/dL — ABNORMAL HIGH (ref 250–450)
UIBC: 509 ug/dL

## 2016-10-05 LAB — PROTIME-INR
INR: 2.26
Prothrombin Time: 25.3 seconds — ABNORMAL HIGH (ref 11.4–15.2)

## 2016-10-05 LAB — RETICULOCYTES
RBC.: 3.96 MIL/uL — ABNORMAL LOW (ref 4.22–5.81)
Retic Count, Absolute: 47.5 10*3/uL (ref 19.0–186.0)
Retic Ct Pct: 1.2 % (ref 0.4–3.1)

## 2016-10-05 LAB — BASIC METABOLIC PANEL
Anion gap: 7 (ref 5–15)
BUN: 11 mg/dL (ref 6–20)
CO2: 23 mmol/L (ref 22–32)
Calcium: 8.6 mg/dL — ABNORMAL LOW (ref 8.9–10.3)
Chloride: 105 mmol/L (ref 101–111)
Creatinine, Ser: 1.01 mg/dL (ref 0.61–1.24)
GFR calc Af Amer: >60 mL/min (ref >60)
GFR calc non Af Amer: >60 mL/min (ref >60)
Glucose, Bld: 140 mg/dL — ABNORMAL HIGH (ref 65–99)
Potassium: 4 mmol/L (ref 3.5–5.1)
Sodium: 135 mmol/L (ref 135–145)

## 2016-10-05 LAB — FERRITIN: Ferritin: 7 ng/mL — ABNORMAL LOW (ref 24–336)

## 2016-10-05 MED ORDER — PANTOPRAZOLE SODIUM 40 MG PO TBEC
40.0000 mg | DELAYED_RELEASE_TABLET | Freq: Every day | ORAL | Status: DC
  Administered 2016-10-05 – 2016-10-06 (×2): 40 mg via ORAL
  Filled 2016-10-05 (×2): qty 1

## 2016-10-05 MED ORDER — FERUMOXYTOL INJECTION 510 MG/17 ML
510.0000 mg | INTRAVENOUS | Status: DC
  Administered 2016-10-05: 510 mg via INTRAVENOUS
  Filled 2016-10-05: qty 17

## 2016-10-05 NOTE — Consult Note (Signed)
Referring Provider:   [facility] Primary Care Physician:  Merrilee Seashore, MD Primary Gastroenterologist:  Althia Forts  Reason for Consultation:  Lower GI bleed  HPI: DEUNTA BENEKE is a 46 y.o. 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 Patient was found to have anemia with hemoglobin of 6.1. GI is consulted for further evaluation.  Patient seen and examined. According to patient he has been seen small amount of bright blood on the tissue paper as well as in the commode since more than 5 years. Described as blood with each bowel movement. Usually one to 2 bowel movements per day. He was doing fine until a month ago when he started feeling weakness and fatigue with minimal exertion. Denied any black stool. Denied any upper GI symptoms.  No family history of colon cancer. No previous colonoscopy.    Past Medical History:  Diagnosis Date  . Dysrhythmia   . ETOH abuse   . Heart murmur   . 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 Non-ischemic cardiomyopathy ([place])   . PAF (paroxysmal atrial fibrillation) ([place])    a. Dx 07/2013, s/p TEE/DCCV.  . Paroxysmal atrial flutter ([place])    a. Dx 07/2013.  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 S/P Maze operation for atrial fibrillation 09/27/2013   Complete bilateral atrial lesion set using bipolar radiofrequency and cryothermy ablation with clipping of LA appendage  . Severe aortic insufficiency    a. Bicuspid aortic valve with severe AI, dilated ascending aorta dx 07/2013.  [name] Systolic CHF ([place])    a. Dx 07/2013: EF 25%, felt likely related to combo of EtOH + tachy-mediated + AI. LHC pending.    Past Surgical History:  Procedure Laterality Date  . ASCENDING AORTIC ROOT REPLACEMENT N/A  09/27/2013   Procedure: ASCENDING AORTIC ROOT REPLACEMENT;  Surgeon: [name], MD;  Location: [place];  Service: Open Heart Surgery;  Laterality: N/A;  . BENTALL PROCEDURE N/A 09/27/2013   Procedure: BENTALL PROCEDURE;  Surgeon: [name], MD;  Location: [place];  Service: Open Heart Surgery;  Laterality: N/A;  . CARDIAC CATHETERIZATION    . CARDIOVERSION N/A 08/01/2013   Procedure: CARDIOVERSION;  Surgeon: [name], MD;  Location: [place];  Service: Cardiovascular;  Laterality: N/A;  . INTRAOPERATIVE TRANSESOPHAGEAL ECHOCARDIOGRAM N/A 09/27/2013   Procedure: INTRAOPERATIVE TRANSESOPHAGEAL ECHOCARDIOGRAM;  Surgeon: [name], MD;  Location: [place];  Service: Open Heart Surgery;  Laterality: N/A;  . LEFT AND RIGHT HEART CATHETERIZATION WITH CORONARY ANGIOGRAM N/A 09/05/2013   Procedure: LEFT AND RIGHT HEART CATHETERIZATION WITH CORONARY ANGIOGRAM;  Surgeon: [name], MD;  Location: [facility] C[facility] LAB;  Service: Cardiovascular;  Laterality: N/A;  . MAZE N/A 09/27/2013   Procedure: MAZE;  Surgeon: [name], MD;  Location: [place];  Service: Open Heart Surgery;  Laterality: N/A;  . NO PAST SURGERIES    . TEE WITHOUT CARDIOVERSION N/A 08/01/2013   Procedure: TRANSESOPHAGEAL ECHOCARDIOGRAM (TEE);  Surgeon: [name], MD;  Location: [place];  Service: Cardiovascular;  Laterality: N/A;    Prior to Admission medications   Medication Sig Start Date End Date Taking? Authorizing Provider  aspirin EC 81 MG tablet Take 1 tablet (81 mg total) by mouth daily. 10/27/13  Yes [name], MD  carvedilol (COREG) 12.5 MG tablet TAKE 1 TABLET BY MOUTH TWICE DAILY 04/23/16  Yes Jolaine Artist, MD  losartan (COZAAR) 50 MG tablet Take 75 mg by mouth daily.   Yes Historical Provider, MD  spironolactone (ALDACTONE) 25 MG tablet Take 1 tablet (25 mg total) by mouth daily. 09/15/16  Yes Jolaine Artist, MD  warfarin (COUMADIN) 5 MG tablet TAKE AS DIRECTED BY COUMADIN CLINIC  09/17/16  Yes [name], MD    Scheduled Meds: . aspirin EC  81 mg Oral Daily  . carvedilol  12.5 mg Oral BID WC  . losartan  75 mg Oral Daily  . pantoprazole  40 mg Oral Q0600  . spironolactone  25 mg Oral Daily   Continuous Infusions: PRN Meds:.  Allergies as of 10/04/2016 - Review Complete 10/04/2016  Allergen Reaction Noted  . Morphine and related Nausea Only 03/28/2016    Family History  Problem Relation Age of Onset  . Heart failure Father   . Coronary artery disease Father     CABG    Social History   Social History  . Marital status: Single    Spouse name: N/A  . Number of children: N/A  . Years of education: N/A   Occupational History  . Not on file.   Social History Main Topics  . Smoking status: Former Smoker    Packs/day: 1.50    Years: 20.00    Quit date: 09/25/2009  . Smokeless tobacco: Never Used  . Alcohol use Yes     Comment: 25 drinks/week - occasional in 2 months.only 1 drink in past 2 months-09/23/13  . Drug use: No  . Sexual activity: Not on file   Other Topics Concern  . Not on file   Social History Narrative  . No narrative on file    Review of Systems: All negative except as stated above in HPI. Review of Systems  Constitutional: Positive for malaise/fatigue. Negative for chills and fever.  HENT: Negative for ear pain, hearing loss and tinnitus.   Eyes: Negative for photophobia, pain and discharge.  Respiratory: Negative for cough, hemoptysis and sputum production.   Cardiovascular: Negative for chest pain and palpitations.  Gastrointestinal: Positive for blood in stool. Negative for abdominal pain, constipation, heartburn, nausea and vomiting.  Genitourinary: Negative for dysuria and urgency.  Musculoskeletal: Negative for myalgias and neck pain.  Skin: Negative for itching and rash.  Neurological: Positive for weakness. Negative for dizziness, speech change and focal weakness.  Endo/Heme/Allergies: Bruises/bleeds easily.   Psychiatric/Behavioral: Negative for hallucinations and suicidal ideas.    Physical Exam: Vital signs: Vitals:   10/05/16 0154 10/05/16 0430  BP: (!) 142/79 (!) 146/78  Pulse: 62 (!) 55  Resp: 16 16  Temp: 98.8 F (37.1 C) 98.7 F (37.1 C)     Physical Exam  Constitutional: He is oriented to person, place, and time and well-developed, well-nourished, and in no distress. No distress.  HENT:  Head: Normocephalic and atraumatic.  Mouth/Throat: Oropharynx is clear and moist.  Eyes: EOM are normal. No scleral icterus.  Neck: Normal range of motion. Neck supple. No thyromegaly present.  Cardiovascular: Normal rate and regular rhythm.  Exam reveals no friction rub.   Mechanical click noted  Pulmonary/Chest: Effort normal and breath sounds normal. No respiratory distress. He has no wheezes. He exhibits no tenderness.  Abdominal: Soft. Bowel sounds are normal. He exhibits no distension. There is no tenderness. There is no rebound and no guarding.  Musculoskeletal: Normal range of  motion. He exhibits no edema.  Neurological: He is alert and oriented to person, place, and time.  Skin: Skin is warm. No erythema.  Psychiatric: Mood, memory, affect and judgment normal.    GI:  Lab Results:  Recent Labs  10/04/16 1751  WBC 6.7  HGB 6.1*  HCT 21.5*  PLT 268   BMET  Recent Labs  10/04/16 1751  NA 136  K 4.9  CL 100*  CO2 27  GLUCOSE 104*  BUN 15  CREATININE 1.00  CALCIUM 9.0   LFT No results for input(s): PROT, ALBUMIN, AST, ALT, ALKPHOS, BILITOT, BILIDIR, IBILI in the last 72 hours. PT/INR  Recent Labs  10/04/16 1905  LABPROT 24.7*  INR 2.19     Studies/Results: Dg Chest 2 View  Result Date: 10/04/2016 CLINICAL DATA:  Fatigue dizziness and lightheadedness. EXAM: CHEST  2 VIEW COMPARISON:  10/17/2013 FINDINGS: Postsurgical changes from aortic valve replacement and left atrial appendage clamp are stable. The cardiac silhouette is enlarged, stable. Mediastinal  contours appear intact. Tortuosity of the aorta. There is no evidence of focal airspace consolidation, pleural effusion or pneumothorax. Osseous structures are without acute abnormality. Soft tissues are grossly normal. IMPRESSION: Enlarged cardiac silhouette which may represent cardiomegaly or pericardial effusion. No pulmonary edema or airspace consolidation. Electronically Signed   By: Fidela Salisbury M.D.   On: 10/04/2016 18:32    Impression/Plan: - Bright red blood per rectum with each bowel movement since last several years. Most likely from hemorrhoids - Acute blood loss anemia with hemoglobin of 6.1 on admission - History of mechanical aortic wall. Was on Coumadin. Last dose yesterday. INR 2.19 on admission - History of paroxysmal atrial fibrillation. Currently in sinus rhythm  Recommendations ------------------------- - Monitor H&H and INR. - Patient will need colonoscopy for further evaluation. Risk benefits and alternatives discussed with the patient. Verbalized understanding. - INR needs to be less than 1.7 before we can start prep for colonoscopy. - Clear liquid diet for today. Nothing by mouth past midnight. - GI will follow. Thank you for consultation    LOS: 0 days   Otis Brace  MD, FACP 10/05/2016, 8:54 AM  Pager [phone] If no answer or after 5 PM call [phone]

## 2016-10-05 NOTE — Progress Notes (Signed)
[facility]                                                                              Patient Demographics  [name] Hinton, is a 46 y.o. male, DOB - 1971/03/30, SWF:[id number]  Admit date - 10/04/2016   Admitting Physician Edwin Dada, MD  Outpatient Primary MD for the patient is [facility], MD  Outpatient specialists:   LOS - 0  days    Chief Complaint  Patient presents with  . Chest Pain  . Dizziness       Brief summary    [name] Hinton is a 46 y.o. male with a past medical history significant for bicuspid aortic valve and dilated aortic root s/p Bentall and mechanical aortic valve in 2015 on warfarin, systolic CHF with recovered EF, and pAF who presented with weakness for one month. Patient noted slowly progressive intermittent usually exertional dyspnea and dizziness over the last 1 month. On the day of admission he had episode of chest tightness. Patient also reported that he had bright red blood with every bowel movement for a long time, probably the last 10 years denied any NSAID use Hemoglobin 6.1 at the time of admission, patient was ordered 2 units packed RBCs and admitted for further workup   Assessment & Plan    Principal Problem: Symptomatic anemia:  Suspect lower source. Likely hemorrhoids given chronicity vs diverticulosis - Patient given 2 units of packed RBCs, hemoglobin 8.3 today - GI consulted, warfarin currently on hold - Iron panel showed ferritin low 7, iron 24, TIBC 533  Active problems Chronic systolic CHF:  - Previous EF as low as 25%. 2-D echo in 03/2016 showed EF of 55-60%, followed in CHF clinic by Dr. Benjamine Mola  - compensated at present, euvolemic. -Continue ARB, spironolactone, beta blocker, aspirin  Atrial fibrillation:  - Paroxysmal, CHADS2-VASc 1. -Continue beta blocker, on Coumadin, currently on hold  Mechanical valve:  S/P Bentall aortic root replacement with mechanical valve conduit and maze  procedure - Hold warfarin for now - d/w Dr Aundra Dubin, will obtain PT/INR stat, if less than 2, will start on heparin drip without bolus   History of TIA - Continue aspirin 81 mg daily  Code Status: full DVT Prophylaxis:  SCD's Family Communication: Discussed in detail with the patient, all imaging results, lab results explained to the patient and wife    Disposition Plan:   Time Spent in minutes  25 minutes  Procedures:    Consultants:   - Cardiology -Gastroenterology  Antimicrobials:   None    Medications  Scheduled Meds: . aspirin EC  81 mg Oral Daily  . carvedilol  12.5 mg Oral BID WC  . losartan  75 mg Oral Daily  . pantoprazole  40 mg Oral Q0600  . spironolactone  25 mg Oral Daily   Continuous Infusions: PRN Meds:.   Antibiotics   Anti-infectives    None        Subjective:   Arman Loy was seen and examined today.  No repeat bleeding since yesterday, no BM yet. Patient denies dizziness, chest pain, shortness of breath, abdominal pain, N/V/D/C, new weakness, numbess,  tingling. No acute events overnight.    Objective:   Vitals:   10/05/16 0139 10/05/16 0154 10/05/16 0430 10/05/16 0700  BP: 133/78 (!) 142/79 (!) 146/78   Pulse: (!) 59 62 (!) 55   Resp: 16 16 16    Temp: 99.5 F (37.5 C) 98.8 F (37.1 C) 98.7 F (37.1 C)   TempSrc: Oral Oral Oral   SpO2: 100% 99% 98%   Weight:    74.6 kg (164 lb 6.4 oz)  Height:        Intake/Output Summary (Last 24 hours) at 10/05/16 1014 Last data filed at 10/05/16 0954  Gross per 24 hour  Intake             1275 ml  Output             1000 ml  Net              275 ml     Wt Readings from Last 3 Encounters:  10/05/16 74.6 kg (164 lb 6.4 oz)  03/28/16 76.2 kg (168 lb)  09/24/15 71.7 kg (158 lb)     Exam  General: Alert and oriented x 3, NAD  HEENT:     Neck: Supple, no JVD, no masses  Cardiovascular: S1 S2 auscultated, Mechanical click, Regular rate and rhythm.  Respiratory: Clear to  auscultation bilaterally, no wheezing, rales or rhonchi  Gastrointestinal: Soft, nontender, nondistended, + bowel sounds  Ext: no cyanosis clubbing or edema  Neuro: AAOx3, Cr N's II- XII. Strength 5/5 upper and lower extremities bilaterally  Skin: No rashes  Psych: Normal affect and demeanor, alert and oriented x3    Data Reviewed:  I have personally reviewed following labs and imaging studies  Micro Results No results found for this or any previous visit (from the past 240 hour(s)).  Radiology Reports Dg Chest 2 View  Result Date: 10/04/2016 CLINICAL DATA:  Fatigue dizziness and lightheadedness. EXAM: CHEST  2 VIEW COMPARISON:  10/17/2013 FINDINGS: Postsurgical changes from aortic valve replacement and left atrial appendage clamp are stable. The cardiac silhouette is enlarged, stable. Mediastinal contours appear intact. Tortuosity of the aorta. There is no evidence of focal airspace consolidation, pleural effusion or pneumothorax. Osseous structures are without acute abnormality. Soft tissues are grossly normal. IMPRESSION: Enlarged cardiac silhouette which may represent cardiomegaly or pericardial effusion. No pulmonary edema or airspace consolidation. Electronically Signed   By: Fidela Salisbury M.D.   On: 10/04/2016 18:32    Lab Data:  CBC:  Recent Labs Lab 10/04/16 1751 10/05/16 0824  WBC 6.7 6.8  HGB 6.1* 8.3*  HCT 21.5* 27.6*  MCV 68.9* 71.1*  PLT 268 196   Basic Metabolic Panel:  Recent Labs Lab 10/04/16 1751 10/05/16 0824  NA 136 135  K 4.9 4.0  CL 100* 105  CO2 27 23  GLUCOSE 104* 140*  BUN 15 11  CREATININE 1.00 1.01  CALCIUM 9.0 8.6*   GFR: Estimated Creatinine Clearance: 91.2 mL/min (by C-G formula based on SCr of 1.01 mg/dL). Liver Function Tests: No results for input(s): AST, ALT, ALKPHOS, BILITOT, PROT, ALBUMIN in the last 168 hours. No results for input(s): LIPASE, AMYLASE in the last 168 hours. No results for input(s): AMMONIA in the last  168 hours. Coagulation Profile:  Recent Labs Lab 10/04/16 1905 10/05/16 0823  INR 2.19 2.26   Cardiac Enzymes: No results for input(s): CKTOTAL, CKMB, CKMBINDEX, TROPONINI in the last 168 hours. BNP (last 3 results) No results for input(s): PROBNP in the last  8760 hours. HbA1C: No results for input(s): HGBA1C in the last 72 hours. CBG: No results for input(s): GLUCAP in the last 168 hours. Lipid Profile: No results for input(s): CHOL, HDL, LDLCALC, TRIG, CHOLHDL, LDLDIRECT in the last 72 hours. Thyroid Function Tests: No results for input(s): TSH, T4TOTAL, FREET4, T3FREE, THYROIDAB in the last 72 hours. Anemia Panel:  Recent Labs  10/05/16 0824  FERRITIN 7*  TIBC 533*  IRON 24*  RETICCTPCT 1.2   Urine analysis:    Component Value Date/Time   COLORURINE YELLOW 09/26/2013 0838   APPEARANCEUR CLEAR 09/26/2013 0838   LABSPEC 1.019 09/26/2013 0838   PHURINE 5.0 09/26/2013 0838   GLUCOSEU NEGATIVE 09/26/2013 0838   HGBUR NEGATIVE 09/26/2013 0838   BILIRUBINUR NEGATIVE 09/26/2013 0838   KETONESUR NEGATIVE 09/26/2013 0838   PROTEINUR NEGATIVE 09/26/2013 0838   UROBILINOGEN 0.2 09/26/2013 0838   NITRITE NEGATIVE 09/26/2013 0838   LEUKOCYTESUR NEGATIVE 09/26/2013 4327     RAI,RIPUDEEP M.D. [facility] 10/05/2016, 10:14 AM  Pager: [phone] Between 7am to 7pm - call Pager - [phone]  After 7pm go to [email or web address] - password [email or web address]  Call night coverage person covering after 7pm

## 2016-10-05 NOTE — Consult Note (Signed)
CARDIOLOGY CONSULT NOTE   Patient ID: [name] Hinton MRN: [id number] DOB/AGE: 1971/02/17 46 y.o.  Admit date: 10/04/2016  Primary Physician   Merrilee Seashore, MD Primary Cardiologist   Dr Aundra Dubin 03/28/2016 Reason for Consultation   Anemia on coumadin w/ Mech AoV Requesting MD: Dr Tana Coast  [name] Hinton is a 46 y.o. year old male with a history of Bentall Hinton w/ Ao Root replacement 2nd bicuspid AoV w/ severe AI, ETOH abuse, parox A flutter/fib s/p TEE/DCCV and subsequent amio for recurrence, s/p MAZE Hinton, NICM w/ EF prev 30-35%, up to 55% 03/2016 echo. On coumadin  10/04/2016, pt admitted w/ weakness x 1 month. H&H 6.1/21.5. INR had been 3.4 on 02/28, was 2.19 on admission. Cardiology asked to evaluate him. He has been transfused and GI has seen. INR less than 1.7, then prepped can be started for colonoscopy.  Symptoms progressed over time, became so much worse yesterday that he came in. Sx were DOE, fatigue, weakness.   Pt states the bleeding started several years ago, before his surgery. It had gradually gotten worse over time. His last labs in the system were 03/2016 and his H&H were 12.0/39. His PCP drew labs more recently, not in the system.   He has been compliant with coumadin, followed by our Clinic.  No other issues or concerns.   Past Medical History:  Diagnosis Date  . Dysrhythmia   . ETOH abuse   . Heart murmur   . 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 Non-ischemic cardiomyopathy ([place])   . PAF (paroxysmal atrial fibrillation) ([place])    a. Dx 07/2013, s/p TEE/DCCV.  . Paroxysmal atrial flutter ([place])    a. Dx 07/2013.  [name] Hinton 09/27/2013   29 mm Sorin Carbomedics Carbo-seal Valsalve mechanical valve conduit  . S/P Maze operation for atrial fibrillation 09/27/2013   Complete bilateral atrial lesion set using bipolar radiofrequency and cryothermy ablation with  clipping of LA appendage  . Severe aortic insufficiency    a. Bicuspid aortic valve with severe AI, dilated ascending aorta dx 07/2013.  [name] Systolic CHF ([place])    a. Dx 07/2013: EF 25%, felt likely related to combo of EtOH + tachy-mediated + AI. LHC pending.     Past Surgical History:  Hinton Laterality Date  . ASCENDING AORTIC ROOT REPLACEMENT N/A 09/27/2013   Hinton: ASCENDING AORTIC ROOT REPLACEMENT;  Surgeon: [name], MD;  Location: [place];  Service: Open Heart Surgery;  Laterality: N/A;  . BENTALL Hinton N/A 09/27/2013   Hinton: BENTALL Hinton;  Surgeon: [name], MD;  Location: [place];  Service: Open Heart Surgery;  Laterality: N/A;  . CARDIAC CATHETERIZATION    . CARDIOVERSION N/A 08/01/2013   Hinton: CARDIOVERSION;  Surgeon: [name], MD;  Location: [place];  Service: Cardiovascular;  Laterality: N/A;  . INTRAOPERATIVE TRANSESOPHAGEAL ECHOCARDIOGRAM N/A 09/27/2013   Hinton: INTRAOPERATIVE TRANSESOPHAGEAL ECHOCARDIOGRAM;  Surgeon: [name], MD;  Location: [place];  Service: Open Heart Surgery;  Laterality: N/A;  . LEFT AND RIGHT HEART CATHETERIZATION WITH CORONARY ANGIOGRAM N/A 09/05/2013   Hinton: LEFT AND RIGHT HEART CATHETERIZATION WITH CORONARY ANGIOGRAM;  Surgeon: [name], MD;  Location: [facility] CATH LAB;  Service: Cardiovascular;  Laterality: N/A;  . MAZE N/A 09/27/2013   Hinton: MAZE;  Surgeon: [name], MD;  Location: [place];  Service: Open Heart Surgery;  Laterality: N/A;  .  NO PAST SURGERIES    . TEE WITHOUT CARDIOVERSION N/A 08/01/2013   Hinton: TRANSESOPHAGEAL ECHOCARDIOGRAM (TEE);  Surgeon: [name], MD;  Location: [place];  Service: Cardiovascular;  Laterality: N/A;    Allergies  Allergen Reactions  . Morphine And Related Nausea Only    I have reviewed the patient's current medications . aspirin EC  81 mg Oral Daily  . carvedilol  12.5 mg Oral BID WC  . losartan  75 mg Oral Daily  .  pantoprazole  40 mg Oral Q0600  . spironolactone  25 mg Oral Daily      Prior to Admission medications   Medication Sig Start Date End Date Taking? Authorizing Provider  aspirin EC 81 MG tablet Take 1 tablet (81 mg total) by mouth daily. 10/27/13  Yes [name], MD  carvedilol (COREG) 12.5 MG tablet TAKE 1 TABLET BY MOUTH TWICE DAILY 04/23/16  Yes Jolaine Artist, MD  losartan (COZAAR) 50 MG tablet Take 75 mg by mouth daily.   Yes Historical Provider, MD  spironolactone (ALDACTONE) 25 MG tablet Take 1 tablet (25 mg total) by mouth daily. 09/15/16  Yes Jolaine Artist, MD  warfarin (COUMADIN) 5 MG tablet TAKE AS DIRECTED BY COUMADIN CLINIC 09/17/16  Yes [name], MD     Social History   Social History  . Marital status: Single    Spouse name: N/A  . Number of children: N/A  . Years of education: N/A   Occupational History  . Restaurant work at [employer]  . Smoking status: Former Smoker    Packs/day: 1.50    Years: 20.00    Quit date: 09/25/2009  . Smokeless tobacco: Never Used  . Alcohol use Yes     Comment: was 25 drinks/week - now 2-3 glasses of wine or cocktails/week  . Drug use: No  . Sexual activity: Not on file   Other Topics Concern  . Not on file   Social History Narrative   Lives with wife.    Family Status  Relation Status  . Father Alive   CAD, S/P CABG  . Mother Alive   Family History  Problem Relation Age of Onset  . Heart failure Father   . Coronary artery disease Father     CABG     ROS:  Full 14 point review of systems complete and found to be negative unless listed above.  Physical Exam: Blood pressure (!) 146/78, pulse (!) 55, temperature 98.7 F (37.1 C), temperature source Oral, resp. rate 16, height 5\' 8"  (1.727 m), weight 164 lb 6.4 oz (74.6 kg), SpO2 98 %.  General: Well developed, well nourished, male in no acute distress Head: Eyes PERRLA, No xanthomas.   Normocephalic and atraumatic,  oropharynx without edema or exudate. Dentition: good Lungs: few rales bases Heart: HRRR S1 S2, no rub/gallop, crisp valve click and systolic murmur. pulses are 2+ all 4 extrem.   Neck: No carotid bruits. No lymphadenopathy.  JVD elevated w/ strong V wave Abdomen: Bowel sounds present, abdomen soft and non-tender without masses or hernias noted. Msk:  No spine or cva tenderness. No weakness, no joint deformities or effusions. Extremities: No clubbing or cyanosis. No edema.  Neuro: Alert and oriented X 3. No focal deficits noted. Psych:  Good affect, responds appropriately Skin: No rashes or lesions noted.  Labs:   Lab Results  Component Value Date   WBC 6.8 10/05/2016   HGB 8.3 (L)  10/05/2016   HCT 27.6 (L) 10/05/2016   MCV 71.1 (L) 10/05/2016   PLT 250 10/05/2016    Recent Labs  10/05/16 0823  INR 2.26     Recent Labs Lab 10/05/16 0824  NA 135  K 4.0  CL 105  CO2 23  BUN 11  CREATININE 1.01  CALCIUM 8.6*  GLUCOSE 140*    Recent Labs  10/04/16 1833  TROPIPOC 0.01   Ferritin  Date/Time Value Ref Range Status  10/05/2016 08:24 AM 7 (L) 24 - 336 ng/mL Final   TIBC  Date/Time Value Ref Range Status  10/05/2016 08:24 AM 533 (H) 250 - 450 ug/dL Final   Iron  Date/Time Value Ref Range Status  10/05/2016 08:24 AM 24 (L) 45 - 182 ug/dL Final   Retic Ct Pct  Date/Time Value Ref Range Status  10/05/2016 08:24 AM 1.2 0.4 - 3.1 % Final    Echo: 03/31/2016 - Left ventricle: The cavity size was normal. Wall thickness was   increased in a pattern of mild LVH. Systolic function was normal.   The estimated ejection fraction was in the range of 55% to 60%.   Left ventricular diastolic function parameters were normal. - Aortic valve: AV prosthesis is difficult to see well Peak and   mean gradients through the valve are 25 and 13 mm Hg respectively   This is unchanged from report of 2015. There was trivial   regurgitation. - Left atrium: The atrium was mildly  dilated.  ECG:  10/04/2016 Sinus rhythm with lateral T-wave changes different from ECG dated 03/28/2016.  Cath: R/L heart cath to 16 2015 Procedural Findings: Hemodynamics (mmHg) RA mean 5 RV 69/10 PA 62/25, mean 39 PCWP mean 22 LV 115/35 AO 111/50 Oxygen saturations: PA 59% AO 99% Cardiac Output (Fick) 3.28  Cardiac Index (Fick) 1.8 PVR 5.2 WU     Coronary angiography: Coronary dominance: right Left mainstem: No significant disease.  Left anterior descending (LAD): Luminal irregularities in the distal LAD.  Left circumflex (LCx): There was a large ramus.  No significant disease in the LCx system.  Right coronary artery (RCA): Difficult to engage due to location and to catheter whip with severe aortic insufficiency.  There was no significant CAD.  Left ventriculography: Not done.  LVEDP was very high with severe AI.  For the same reason, aortic root shot was not done.  Final Conclusions:  No significant coronary disease.  PCWP and LVEDP elevated in the setting of severe AI with moderate pulmonary hypertension (suspect mostly pulmonary venous hypertension but with PVR 5.1 WU there may be a component of vascular remodeling).  EF 35-40% on last echo, somewhat improved. CI was measured as low, not sure accuracy given how well patient is doing symptomatically.  Today, I will increase Lasix back to 40 mg bid.  I will see him back in the CHF clinic in 10-14 days.  He will followup with Dr. Roxy Manns for AVR/ascending aorta repair.    Radiology:  Dg Chest 2 View Result Date: 10/04/2016 CLINICAL DATA:  Fatigue dizziness and lightheadedness. EXAM: CHEST  2 VIEW COMPARISON:  10/17/2013 FINDINGS: Postsurgical changes from aortic valve replacement and left atrial appendage clamp are stable. The cardiac silhouette is enlarged, stable. Mediastinal contours appear intact. Tortuosity of the aorta. There is no evidence of focal airspace consolidation, pleural effusion or pneumothorax. Osseous  structures are without acute abnormality. Soft tissues are grossly normal. IMPRESSION: Enlarged cardiac silhouette which may represent cardiomegaly or pericardial effusion. No pulmonary edema  or airspace consolidation. Electronically Signed   By: Fidela Salisbury M.D.   On: 10/04/2016 18:32    ASSESSMENT AND PLAN:   The patient was seen today by Dr Aundra Dubin, the patient evaluated and the data reviewed.   Principal Problem: 1.  Symptomatic anemia - per IM/GI  Active Problems: 2.  Chronic systolic CHF (congestive heart failure) (HCC) - EF had normalized at last echo. - recheck echo - no chest pain but ECG a little different, MD to review  3.  AF (paroxysmal atrial fibrillation) (H[facility]) - in SR   4.  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 Chronic anticoagulation - coumadin on hold - would avoid Vt K if possible with mech valve - follow  Signed: Rosaria Ferries, PA-C 10/05/2016 10:46 AM Beeper [phone]  Co-Sign MD  Patient seen with PA, agree with the above note.  1. GI bleeding: Suspect lower GI bleeding.  Has long history of BRBPR, may be chronic hemorrhoidal bleeding.  Slow onset dyspnea/fatigue over the last month with hemoglobin down to 6.1 at admission and profound iron deficiency.  - Much improved symptomatically after 2 unit transfusion.  - Will give IV Fe. - Hold warfarin, colonoscopy when INR < 1.7.  2. Chronic systolic CHF: Nonischemic cardiomyopathy.  Tachy-mediated CMP versus ETOH CMP versus long-standing aortic insufficiency or (most likely) a combination of all 3 possible causes.  He is no longer drinking and has had mechanical AVR. Echo 9/17 with EF up to 55-60%. He is not volume overloaded on exam.  BP stable.    - Continue Coreg to 12.5 mg bid.  - Continue losartan to 75 mg bid. Intolerant ace due to cough.  - Continue spironolactone 25 mg daily.   3. Paroxysmal  atrial fibrillation/flutter: s/p TEE/DCCV 08/01/13. Had Maze/LAA obliteration with Bentall.  Post-op atypical flutter. Remaining in SR. No longer on amiodarone.   4. Bicuspid aortic valve disorder with severe AI and severely dilated ascending aorta (5.1 cm on CTA).  Status post mechanical AVR + ascending aorta replacement.  The valve looked good on 9/17 echo. Episode of possible amaurosis fugax so INR goal was increased to 2.5-3.5.  He is on ASA 81.  - With GI bleeding and need for c-scope, hold warfarin.  Would use heparin gtt when INR < 2 given history of TIA.   [name] 10/05/2016 1:44 PM

## 2016-10-06 ENCOUNTER — Other Ambulatory Visit (HOSPITAL_COMMUNITY): Payer: Self-pay

## 2016-10-06 ENCOUNTER — Telehealth: Payer: Self-pay | Admitting: Pharmacist

## 2016-10-06 LAB — BASIC METABOLIC PANEL
Anion gap: 6 (ref 5–15)
BUN: 8 mg/dL (ref 6–20)
CO2: 26 mmol/L (ref 22–32)
Calcium: 9.1 mg/dL (ref 8.9–10.3)
Chloride: 107 mmol/L (ref 101–111)
Creatinine, Ser: 0.92 mg/dL (ref 0.61–1.24)
GFR calc Af Amer: >60 mL/min (ref >60)
GFR calc non Af Amer: >60 mL/min (ref >60)
Glucose, Bld: 82 mg/dL (ref 65–99)
Potassium: 4 mmol/L (ref 3.5–5.1)
Sodium: 139 mmol/L (ref 135–145)

## 2016-10-06 LAB — TYPE AND SCREEN
ABO/RH(D): B POS
Antibody Screen: NEGATIVE
Unit division: 0
Unit division: 0

## 2016-10-06 LAB — BPAM RBC
Blood Product Expiration Date: 201804102359
Blood Product Expiration Date: 201804102359
ISSUE DATE / TIME: 201803172041
ISSUE DATE / TIME: 201803180132
Unit Type and Rh: 7300
Unit Type and Rh: 7300

## 2016-10-06 LAB — CBC
HCT: 29.7 % — ABNORMAL LOW (ref 39.0–52.0)
Hemoglobin: 8.7 g/dL — ABNORMAL LOW (ref 13.0–17.0)
MCH: 21 pg — ABNORMAL LOW (ref 26.0–34.0)
MCHC: 29.3 g/dL — ABNORMAL LOW (ref 30.0–36.0)
MCV: 71.7 fL — ABNORMAL LOW (ref 78.0–100.0)
Platelets: 260 10*3/uL (ref 150–400)
RBC: 4.14 MIL/uL — ABNORMAL LOW (ref 4.22–5.81)
RDW: 16.7 % — ABNORMAL HIGH (ref 11.5–15.5)
WBC: 6.4 10*3/uL (ref 4.0–10.5)

## 2016-10-06 LAB — PROTIME-INR
INR: 2.22
INR: 2.1
Prothrombin Time: 25 seconds — ABNORMAL HIGH (ref 11.4–15.2)
Prothrombin Time: 23.9 seconds — ABNORMAL HIGH (ref 11.4–15.2)

## 2016-10-06 MED ORDER — ENOXAPARIN (LOVENOX) PATIENT EDUCATION KIT
PACK | Freq: Once | Status: AC
  Administered 2016-10-06: 13:00:00
  Filled 2016-10-06: qty 1

## 2016-10-06 MED ORDER — ENOXAPARIN SODIUM 80 MG/0.8ML ~~LOC~~ SOLN: 70.0000 mg | Freq: Two times a day (BID) | SUBCUTANEOUS | 0 refills | Status: DC

## 2016-10-06 MED ORDER — ENOXAPARIN SODIUM 80 MG/0.8ML ~~LOC~~ SOLN: 70.0000 mg | Freq: Once | SUBCUTANEOUS | Status: DC

## 2016-10-06 MED ORDER — WARFARIN SODIUM 5 MG PO TABS: ORAL_TABLET | ORAL | 2 refills | Status: DC

## 2016-10-06 MED ORDER — ENOXAPARIN SODIUM 80 MG/0.8ML ~~LOC~~ SOLN
70.0000 mg | Freq: Once | SUBCUTANEOUS | Status: AC
  Administered 2016-10-06: 70 mg via SUBCUTANEOUS
  Filled 2016-10-06: qty 0.8

## 2016-10-06 MED ORDER — PANTOPRAZOLE SODIUM 40 MG PO TBEC: 40.0000 mg | DELAYED_RELEASE_TABLET | Freq: Every day | ORAL | 1 refills | Status: DC

## 2016-10-06 MED ORDER — VITAMIN K1 10 MG/ML IJ SOLN
5.0000 mg | Freq: Once | INTRAMUSCULAR | Status: AC
  Administered 2016-10-06: 5 mg via SUBCUTANEOUS
  Filled 2016-10-06: qty 0.5

## 2016-10-06 NOTE — Progress Notes (Signed)
Subjective: No further bleeding. No abdominal pain.  Objective: Vital signs in last 24 hours: Temp:  [98 F (36.7 C)-98.7 F (37.1 C)] 98.7 F (37.1 C) (03/19 0622) Pulse Rate:  [54-58] 58 (03/19 0622) Resp:  [14-20] 14 (03/19 0622) BP: (114-128)/(66-78) 124/67 (03/19 0622) SpO2:  [94 %-100 %] 94 % (03/19 0622) Weight:  [73.3 kg (161 lb 9.6 oz)] 73.3 kg (161 lb 9.6 oz) (03/19 0622) Weight change: -1.134 kg (-2 lb 8 oz) Last BM Date: 10/05/16  PE: GEN:  NAD  Lab Results: CBC    Component Value Date/Time   WBC 6.4 10/06/2016 0440   RBC 4.14 (L) 10/06/2016 0440   HGB 8.7 (L) 10/06/2016 0440   HCT 29.7 (L) 10/06/2016 0440   PLT 260 10/06/2016 0440   MCV 71.7 (L) 10/06/2016 0440   MCH 21.0 (L) 10/06/2016 0440   MCHC 29.3 (L) 10/06/2016 0440   RDW 16.7 (H) 10/06/2016 0440   LYMPHSABS 1.4 09/01/2013 1251   MONOABS 0.6 09/01/2013 1251   EOSABS 0.2 09/01/2013 1251   BASOSABS 0.0 09/01/2013 1251   CMP     Component Value Date/Time   NA 139 10/06/2016 0440   K 4.0 10/06/2016 0440   CL 107 10/06/2016 0440   CO2 26 10/06/2016 0440   GLUCOSE 82 10/06/2016 0440   BUN 8 10/06/2016 0440   CREATININE 0.92 10/06/2016 0440   CALCIUM 9.1 10/06/2016 0440   PROT 7.5 12/22/2013 1052   ALBUMIN 4.3 12/22/2013 1052   AST 33 12/22/2013 1052   ALT 30 12/22/2013 1052   ALKPHOS 78 12/22/2013 1052   BILITOT 0.4 12/22/2013 1052   GFRNONAA >60 10/06/2016 0440   GFRAA >60 10/06/2016 0440   INR 2.22  Assessment:  1.  Protracted hematochezia, several years, pre- and post- anticoagulation. 2.  Bicuspid aortic valve, with mechanical aortic valve replacement. 3.  Anemia, likely acute blood loss.  Hgb stable for the past couple days.  Plan:  1.  Patient couldn't have colonoscopy for at least another couple days, due to his current INR.  He wants to go home and have procedure as outpatient.  I have discussed with Dr. Tana Coast [facility]) and Dr. Aundra Dubin (cardiology), and we have all agreed that we can  do this colonoscopy as outpatient, with warfarin held but Lovenox given as outpatient.  Patient is scheduled for 10 am on Friday 10/10/16 at [facility] for outpatient colonoscopy.   2.  Dr. Aundra Dubin and his team will coordinate Lovenox dosing as outpatient; would like to have INR checked Thursday if possible to make sure INR is </=1.8 pre-procedure Friday morning.  If all this is acceptable, my plan would be to have patient receive Lovenox through Thursday 10/09/16, and hold Friday Lovenox until colonoscopy is done (and restart thereafter unless active bleeding site identified). 3.  Howie Ill office will coordinate colonoscopy prep instructions and diet instructions for his procedure Friday 10/10/16.  But until then, it's ok from GI perspective to resume regular diet. 4.  Howie Ill will sign-off; please call with questions; thank you for the consultation.   [name] 10/06/2016, 10:52 AM   Pager [phone] If no answer or after 5 PM call [phone]

## 2016-10-06 NOTE — Progress Notes (Signed)
Pt has orders to be discharged. Pt will be discharged home on lovenox SQ and have outpatient Colonoscopy on Friday 10/10/16. Pt to go for bloodwork prior to procedure on Thursday 10/09/16.   Discharge instructions given and pt has no additional questions at this time. Medication regimen reviewed and pt educated. Pt verbalized understanding and has no additional questions. Telemetry box removed. IV removed and site in good condition. Pt stable and waiting for transportation.   Maurene Capes RN

## 2016-10-06 NOTE — Telephone Encounter (Signed)
-----   Message from Cammie Mcgee, [facility] sent at 10/06/2016  2:03 PM EDT ----- This guy has Hx mechanical valve and Afib - warf on hold Needs colonoscopy on Friday INR 2.26 and Vit K 5mg  given in hospital in hopes of colonoscopy tomorrow but no room  D/c from hospital today with enox 70mg  bid and INR with you on Thur  He will need bridge post procedure as well thanks

## 2016-10-06 NOTE — Telephone Encounter (Signed)
Pt to come in for INR check and Lovenox instructions on 3/22 for colonoscopy 3/23. Was in the hospital with GI bleed and given Vitamin K, no openings for colonoscopy earlier. Discharged on 3/19 on Lovenox 70mg  Maria Antonia BID, INR 2.1. Will check INR Thursday to make sure INR has dropped post-vitamin K since INR needs to be 1.7 or less for colonoscopy. Will give pt Lovenox instructions with last injection 24 hours prior to colonoscopy on Friday and post-op Lovenox instructions.

## 2016-10-06 NOTE — Discharge Summary (Signed)
Physician Discharge Summary   Patient ID: [name] Hinton MRN: [id number] DOB/AGE: 1970-12-05 46 y.o.  Admit date: 10/04/2016 Discharge date: 10/06/2016  Primary Care Physician:  Merrilee Seashore, MD  Discharge Diagnoses:    . Symptomatic anemia    Lower GI bleed . Chronic systolic CHF (congestive heart failure) ([place]) . AF (paroxysmal atrial fibrillation) ([facility])   Mechanical aortic valve    History of TIA   Consults:   Gastroenterology, Dr Paulita Fujita Cardiology, DR Aundra Dubin  Recommendations for Outpatient Follow-up:  1. PT/INR on 3/22 2. Outpatient coloscopy scheduled on 3/23 3. Patient will continue Lovenox until 3/22 and then check PT/INR. Colonoscopy prep will be coordinated by [facility] gastroenterology office   DIET: Heart healthy diet    Allergies:   Allergies  Allergen Reactions  . Morphine And Related Nausea Only     DISCHARGE MEDICATIONS: Current Discharge Medication List    START taking these medications   Details  enoxaparin (LOVENOX) 80 MG/0.8ML injection Inject 0.7 mLs (70 mg total) into the skin every 12 (twelve) hours. Qty: 20 Syringe, Refills: 0    pantoprazole (PROTONIX) 40 MG tablet Take 1 tablet (40 mg total) by mouth daily. Qty: 30 tablet, Refills: 1      CONTINUE these medications which have CHANGED   Details  warfarin (COUMADIN) 5 MG tablet HOLD UNTIL FURTHER DIRECTIONS FROM DR [facility] OR COUMADIN CLINIC Qty: 45 tablet, Refills: 2      CONTINUE these medications which have NOT CHANGED   Details  aspirin EC 81 MG tablet Take 1 tablet (81 mg total) by mouth daily. Qty: 90 tablet, Refills: 3    carvedilol (COREG) 12.5 MG tablet TAKE 1 TABLET BY MOUTH TWICE DAILY Qty: 60 tablet, Refills: 3    losartan (COZAAR) 50 MG tablet Take 75 mg by mouth daily.    spironolactone (ALDACTONE) 25 MG tablet Take 1 tablet (25 mg total) by mouth daily. Qty: 90 tablet, Refills: 3         Brief H and P: For complete details please refer to admission H  and P, but in brief [name] Hinton a 45 y.o.malewith a past medical history significant for bicuspid aortic valve and dilated aortic root s/p Bentall and mechanical aortic valve in 2015 on warfarin, systolic CHF with recovered EF, and pAFwho presented with weakness for one month. Patient noted slowly progressive intermittent usually exertional dyspnea and dizziness over the last 1 month. On the day of admission he had episode of chest tightness. Patient also reported that he had bright red blood with every bowel movement for a long time, probably the last 10 years denied any NSAID use Hemoglobin 6.1 at the time of admission, patient was ordered 2 units packed RBCs and admitted for further workup  Hospital Course:   Symptomatic anemia:Secondary to lower GI bleed Suspect lower source. Likely hemorrhoids given chronicity vs diverticulosis - Patient given 2 units of packed RBCs, hemoglobin 8.7  - GI was consulted, warfarin currently on hold - Iron panel showed ferritin low 7, iron 24, TIBC 533, patient received IV iron - Patient was scheduled for colonoscopy however INR was still 2.2. Hence the patient requested for possibility of outpatient colonoscopy as he could not have closely for at least another couple of days due to his current INR. GI and cardiology coordinated this by placing him on Lovenox, rechecking INR on 3/22 and outpatient coloscopy scheduled on 3/23. Please see instructions below. Coumadin currently on hold.   Chronic systolic CHF: -Previous EF as low  as 25%. 2-D echo in 03/2016 showed EF of 55-60%, followed in CHF clinic by Dr. Benjamine Mola  - compensated at present, euvolemic. -Continue ARB, spironolactone, beta blocker, aspirin  Atrial fibrillation: - Paroxysmal, CHADS2-VASc 1. -Continue beta blocker, on Coumadin, currently on hold  Mechanical valve: S/P Bentall aortic root replacement with mechanical valve conduit and maze procedure - Hold warfarin for now - Placed  on Lovenox until colonoscopy is completed  History of TIA - Continue aspirin 81 mg daily  Day of Discharge BP 124/67 (BP Location: Left Arm)   Pulse (!) 58   Temp 98.7 F (37.1 C) (Oral)   Resp 14   Ht 5\' 8"  (1.727 m)   Wt 73.3 kg (161 lb 9.6 oz) Comment: b scale  SpO2 94%   BMI 24.57 kg/m   Physical Exam: General: Alert and awake oriented x3 not in any acute distress. HEENT: anicteric sclera, pupils reactive to light and accommodation CVS: G2-I9 clear, click noted Chest: clear to auscultation bilaterally, no wheezing rales or rhonchi Abdomen: soft nontender, nondistended, normal bowel sounds Extremities: no cyanosis, clubbing or edema noted bilaterally Neuro: Cranial nerves II-XII intact, no focal neurological deficits   The results of significant diagnostics from this hospitalization (including imaging, microbiology, ancillary and laboratory) are listed below for reference.    LAB RESULTS: Basic Metabolic Panel:  Recent Labs Lab 10/05/16 0824 10/06/16 0440  NA 135 139  K 4.0 4.0  CL 105 107  CO2 23 26  GLUCOSE 140* 82  BUN 11 8  CREATININE 1.01 0.92  CALCIUM 8.6* 9.1   Liver Function Tests: No results for input(s): AST, ALT, ALKPHOS, BILITOT, PROT, ALBUMIN in the last 168 hours. No results for input(s): LIPASE, AMYLASE in the last 168 hours. No results for input(s): AMMONIA in the last 168 hours. CBC:  Recent Labs Lab 10/05/16 0824 10/06/16 0440  WBC 6.8 6.4  HGB 8.3* 8.7*  HCT 27.6* 29.7*  MCV 71.1* 71.7*  PLT 250 260   Cardiac Enzymes: No results for input(s): CKTOTAL, CKMB, CKMBINDEX, TROPONINI in the last 168 hours. BNP: Invalid input(s): POCBNP CBG: No results for input(s): GLUCAP in the last 168 hours.  Significant Diagnostic Studies:  Dg Chest 2 View  Result Date: 10/04/2016 CLINICAL DATA:  Fatigue dizziness and lightheadedness. EXAM: CHEST  2 VIEW COMPARISON:  10/17/2013 FINDINGS: Postsurgical changes from aortic valve replacement and  left atrial appendage clamp are stable. The cardiac silhouette is enlarged, stable. Mediastinal contours appear intact. Tortuosity of the aorta. There is no evidence of focal airspace consolidation, pleural effusion or pneumothorax. Osseous structures are without acute abnormality. Soft tissues are grossly normal. IMPRESSION: Enlarged cardiac silhouette which may represent cardiomegaly or pericardial effusion. No pulmonary edema or airspace consolidation. Electronically Signed   By: Fidela Salisbury M.D.   On: 10/04/2016 18:32    2D ECHO:   Disposition and Follow-up: Discharge Instructions    Diet - low sodium heart healthy    Complete by:  As directed    Discharge instructions    Complete by:  As directed    1. You are scheduled for 10 am on Friday 10/10/16 at [facility] for outpatient colonoscopy.   2.  Dr. Aundra Dubin will coordinate Lovenox dosing as outpatient; would like to have INR checked Thursday 3/22 to make sure INR is </=1.8 pre-procedure Friday morning.  Hold Friday 3/23 Lovenox until colonoscopy is done (and restart thereafter unless active bleeding site identified), please follow recommendations from GI physician regarding Lovenox/Coumadin. 3.  [facility]  [place] office will coordinate colonoscopy prep instructions and diet instructions for the procedure on Friday 10/10/16.   Increase activity slowly    Complete by:  As directed        DISPOSITION: home    [place] Office Follow up on 10/09/2016.   Specialty:  Cardiology Why:  at 10:45am for INR check at the [facility]. office.  Contact information: [address], [address] [place]       [name], MD Follow up on 10/29/2016.   Specialty:  Cardiology Why:  at 2:20pm for hospital follow up.  Contact information: [address] [place] [address] [phone]            Time spent on  Discharge: 33mins  Signed:   RAI,RIPUDEEP M.D. [facility] 10/06/2016, 11:26 AM Pager: [phone]

## 2016-10-06 NOTE — Progress Notes (Signed)
ANTICOAGULATION CONSULT NOTE - Initial Consult  Pharmacy Consult for Enoxaparin Indication: atrial fibrillation  Allergies  Allergen Reactions  . Morphine And Related Nausea Only    Patient Measurements: Height: 5\' 8"  (172.7 cm) Weight: 161 lb 9.6 oz (73.3 kg) (b scale) IBW/kg (Calculated) : 68.4   Vital Signs: Temp: 98.7 F (37.1 C) (03/19 0622) Temp Source: Oral (03/19 0622) BP: 124/67 (03/19 0622) Pulse Rate: 58 (03/19 0622)  Labs:  Recent Labs  10/04/16 1751 10/04/16 1905 10/05/16 0823 10/05/16 0824 10/06/16 0440  HGB 6.1*  --   --  8.3* 8.7*  HCT 21.5*  --   --  27.6* 29.7*  PLT 268  --   --  250 260  LABPROT  --  24.7* 25.3*  --  25.0*  INR  --  2.19 2.26  --  2.22  CREATININE 1.00  --   --  1.01 0.92    Estimated Creatinine Clearance: 100.2 mL/min (by C-G formula based on SCr of 0.92 mg/dL).   Medical History: Past Medical History:  Diagnosis Date  . Dysrhythmia   . ETOH abuse   . Heart murmur   . 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 Non-ischemic cardiomyopathy ([place])   . PAF (paroxysmal atrial fibrillation) ([place])    a. Dx 07/2013, s/p TEE/DCCV.  . Paroxysmal atrial flutter ([place])    a. Dx 07/2013.  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 S/P Maze operation for atrial fibrillation 09/27/2013   Complete bilateral atrial lesion set using bipolar radiofrequency and cryothermy ablation with clipping of LA appendage  . Severe aortic insufficiency    a. Bicuspid aortic valve with severe AI, dilated ascending aorta dx 07/2013.  [name] Systolic CHF ([place])    a. Dx 07/2013: EF 25%, felt likely related to combo of EtOH + tachy-mediated + AI. LHC pending.      Assessment: 45yom with Hx Afib, mechanical AVR and possible TIA with transient blindness - now presents with GIB and planned colonoscopy when INR < 1.7.  INR 2.2 and vit K  5mg  po given 3/19.    Goal of Therapy:  Anti-Xa level 0.6-1 units/ml 4hrs after LMWH dose given INR 2.5-3.5 Monitor platelets by anticoagulation protocol: Yes   Plan:  Enoxaparin 1mg /kg = 70mg  q12hr - begin this evening INR check at [facility] 3/22 prior to colonoscopy Fri.   Bonnita Nasuti Pharm.D. CPP, BCPS Clinical Pharmacist [phone] 10/06/2016 10:55 AM

## 2016-10-06 NOTE — Progress Notes (Signed)
Advanced Heart Failure Rounding Note  PCP: Merrilee Seashore, MD Primary Cardiologist: Dr. Aundra Dubin   Subjective:    Admitted 3/17 with fatigue and weakness. Anemic, likely lower GI bleed. Required 2 units PRBC's. Has history of NICM, last EF was 55-60% in 03/2016. Warfarin on hold for c-scope, waiting for INR to be <2 to do GI study.   Says he feels great, no orthopnea or PND. HAs had 2 BM's since admission, no blood noted. Appears euvolemic.   Objective:   Weight Range: 161 lb 9.6 oz (73.3 kg) Body mass index is 24.57 kg/m.   Vital Signs:   Temp:  [98 F (36.7 C)-98.7 F (37.1 C)] 98.7 F (37.1 C) (03/19 0622) Pulse Rate:  [54-58] 58 (03/19 0622) Resp:  [14-20] 14 (03/19 0622) BP: (114-128)/(66-78) 124/67 (03/19 0622) SpO2:  [94 %-100 %] 94 % (03/19 0622) Weight:  [161 lb 9.6 oz (73.3 kg)] 161 lb 9.6 oz (73.3 kg) (03/19 0622) Last BM Date: 10/05/16  Weight change: Filed Weights   10/04/16 2304 10/05/16 0700 10/06/16 0622  Weight: 164 lb 1.6 oz (74.4 kg) 164 lb 6.4 oz (74.6 kg) 161 lb 9.6 oz (73.3 kg)    Intake/Output:   Intake/Output Summary (Last 24 hours) at 10/06/16 0842 Last data filed at 10/06/16 0622  Gross per 24 hour  Intake             1137 ml  Output             3200 ml  Net            -2063 ml     Physical Exam: General:  NAD, resting in bed.  HEENT: normal, atraumatic.  Neck: supple. No JVP . Carotids 2+ bilat; no bruits. No lymphadenopathy or thyromegaly appreciated. Cor: PMI nondisplaced. Regular rate & rhythm, valve click appreciated.  No rubs, gallops or murmurs. Lungs: clear in all lobes.  Abdomen: soft, nontender, nondistended. No hepatosplenomegaly. No bruits or masses. Good bowel sounds. Extremities: no cyanosis, clubbing, rash, no peripheral edema Neuro: alert & orientedx3, cranial nerves grossly intact. moves all 4 extremities w/o difficulty. Affect pleasant   Telemetry: NSR, sinus brady at times with rates in the upper 50's.  Personally reviewed.   Labs: CBC  Recent Labs  10/05/16 0824 10/06/16 0440  WBC 6.8 6.4  HGB 8.3* 8.7*  HCT 27.6* 29.7*  MCV 71.1* 71.7*  PLT 250 510   Basic Metabolic Panel  Recent Labs  10/05/16 0824 10/06/16 0440  NA 135 139  K 4.0 4.0  CL 105 107  CO2 23 26  GLUCOSE 140* 82  BUN 11 8  CREATININE 1.01 0.92  CALCIUM 8.6* 9.1    Imaging/Studies: Echo: 03/31/2016 - Left ventricle: The cavity size was normal. Wall thickness was increased in a pattern of mild LVH. Systolic function was normal. The estimated ejection fraction was in the range of 55% to 60%. Left ventricular diastolic function parameters were normal. - Aortic valve: AV prosthesis is difficult to see well Peak and mean gradients through the valve are 25 and 13 mm Hg respectively This is unchanged from report of 2015. There was trivial regurgitation. - Left atrium: The atrium was mildly dilated.  Cath: R/L heart cath to 16 2015 Procedural Findings: Hemodynamics (mmHg) RA mean 5 RV 69/10 PA 62/25, mean 39 PCWP mean 22 LV 115/35 AO 111/50 Oxygen saturations: PA 59% AO 99% Cardiac Output (Fick) 3.28  Cardiac Index (Fick) 1.8 PVR 5.2 WU Coronary angiography: Coronary dominance: right  Left mainstem: No significant disease.  Left anterior descending (LAD): Luminal irregularities in the distal LAD.  Left circumflex (LCx): There was a large ramus. No significant disease in the LCx system.  Right coronary artery (RCA): Difficult to engage due to location and to catheter whip with severe aortic insufficiency. There was no significant CAD.  Left ventriculography: Not done. LVEDP was very high with severe AI. For the same reason, aortic root shot was not done.    Medications:     Scheduled Medications: . aspirin EC  81 mg Oral Daily  . carvedilol  12.5 mg Oral BID WC  . ferumoxytol  510 mg Intravenous Weekly  . losartan  75 mg Oral Daily  . pantoprazole  40 mg Oral  Q0600  . phytonadione  5 mg Subcutaneous Once  . spironolactone  25 mg Oral Daily        Assessment/Plan   1. GI bleeding: Suspect lower GI bleeding.  Has long history of BRBPR, may be chronic hemorrhoidal bleeding. Slow onset dyspnea/fatigue over the last month with hemoglobin down to 6.1 at admission and profound iron deficiency.  - Much improved symptomatically after 2 unit transfusion. Hgb 6.1->8.3->8.7 - Got IV Fe yesterday.  - Hold warfarin, colonoscopy when INR < 1.7.  - INR Is 2.22 2. Chronic systolic CHF: Nonischemic cardiomyopathy, NYHA II.Tachy-mediated CMP versus ETOH CMP versus long-standing aortic insufficiency or (most likely) a combination of all 3 possible causes. He is no longer drinking and has had mechanical AVR. Echo 9/17 with EF up to 55-60%.  - Appears euvolemic today. - Continue Coreg to 12.5 mg bid.  - Continue losartan to 75 mg bid. Intolerant ace due to cough.  - Continue spironolactone 25 mg daily.  3. Paroxysmal atrial fibrillation/flutter: s/p TEE/DCCV 08/01/13. Had Maze/LAA obliteration with Bentall. Post-op atypical flutter. Remaining in SR. No longer on amiodarone.  4. Bicuspid aortic valve disorder with severe AI and severely dilated ascending aorta (5.1 cm on CTA). Status post mechanical AVR + ascending aorta replacement. The valve looked good on 9/17 echo. Episode of possible amaurosis fugax so INR goal was increased to 2.5-3.5.  He is on ASA 81.  - With GI bleeding and need for c-scope, hold warfarin.  Would use heparin gtt when INR < 2 given history of TIA.   Length of Stay: [place], NP  10/06/2016, 8:42 AM  Arbutus Leas, NP-C Advanced Heart Failure Team  Pager [phone] M-F 7am-4pm.  Please contact [facility] for night-coverage after hours (4p -7a ) and weekends on [email or web address]  Patient seen with NP, agree with the above note.  I discussed the situation with Dr. Paulita Fujita.  Hemoglobin higher today.  Had BM without blood.  Will  start Lovenox bid, plan for colonoscopy when INR < 1.8.  He will have INR check on Thursday, colonoscopy on Friday.  Restart warfarin with Lovenox bridge post-colonoscopy.  Will coordinate all this with coumadin clinic.  He will followup with me in a few weeks.   [name] 10/06/2016 1:52 PM

## 2016-10-09 ENCOUNTER — Ambulatory Visit (INDEPENDENT_AMBULATORY_CARE_PROVIDER_SITE_OTHER): Payer: 59 | Admitting: *Deleted

## 2016-10-09 DIAGNOSIS — I351 Nonrheumatic aortic (valve) insufficiency: Secondary | ICD-10-CM | POA: Diagnosis not present

## 2016-10-09 DIAGNOSIS — Z9889 Other specified postprocedural states: Secondary | ICD-10-CM | POA: Diagnosis not present

## 2016-10-09 DIAGNOSIS — Z8679 Personal history of other diseases of the circulatory system: Secondary | ICD-10-CM

## 2016-10-09 DIAGNOSIS — I359 Nonrheumatic aortic valve disorder, unspecified: Secondary | ICD-10-CM | POA: Diagnosis not present

## 2016-10-09 LAB — POCT INR: INR: 1.1

## 2016-10-09 NOTE — Patient Instructions (Addendum)
  10/10/16: Procedure Day - No Lovenox - Resume Coumadin in the evening or as directed by doctor (take an extra half tablet with usual dose for 2 days then resume normal dose).  10/11/16: Resume Lovenox 80mg   inject in the fatty tissue every 12 hours and take Coumadin.  10/12/16: Inject Lovenox 80mg  in the fatty tissue every 12 hours and take Coumadin.  10/13/16: Inject Lovenox 80mg  in the fatty tissue every 12 hours and take Coumadin.  10/14/16: Inject Lovenox 80mg  in the fatty tissue every 12 hours and take Coumadin.  10/15/16: Inject Lovenox 80mg   in the fatty tissue every 12 hours and take Coumadin.  10/16/16: Inject Lovenox 80mg   in the fatty tissue at 9a & report to Coumadin appt to check INR.

## 2016-10-09 NOTE — Progress Notes (Signed)
I was unable to reach patient by phone.  I left  A message on voice mail.  I instructed the patient to arrive at [facility] entrance at 0830 , nothing to eat or drink after midnight.   I instructed the patient to take the following medications in the am with just enough water to get them down: Carvedilol and Pantoprazole.  I instructed patient to call endoscopy after 0600 to ask if he should take LOvenox injection. I asked patient to not wear any lotions, powders, cologne, jewelry, piercing, make-up or nail polish.  I asked the patient to call [phone] in the am if there were any questions or problems.

## 2016-10-10 ENCOUNTER — Encounter (HOSPITAL_COMMUNITY): Payer: Self-pay | Admitting: *Deleted

## 2016-10-10 ENCOUNTER — Encounter (HOSPITAL_COMMUNITY)
Admission: RE | Disposition: A | Discharge status: Home / Self Care | Payer: Self-pay | Source: Ambulatory Visit | Attending: Gastroenterology

## 2016-10-10 ENCOUNTER — Ambulatory Visit (HOSPITAL_COMMUNITY)
Admission: RE | Admit: 2016-10-10 | Discharge: 2016-10-10 | Disposition: A | Discharge status: Home / Self Care | Payer: 59 | Source: Ambulatory Visit | Attending: Gastroenterology | Admitting: Gastroenterology

## 2016-10-10 ENCOUNTER — Other Ambulatory Visit (HOSPITAL_COMMUNITY): Payer: Self-pay | Admitting: Internal Medicine

## 2016-10-10 ENCOUNTER — Ambulatory Visit (HOSPITAL_COMMUNITY): Payer: 59 | Admitting: Certified Registered Nurse Anesthetist

## 2016-10-10 DIAGNOSIS — I429 Cardiomyopathy, unspecified: Secondary | ICD-10-CM | POA: Insufficient documentation

## 2016-10-10 DIAGNOSIS — K921 Melena: Secondary | ICD-10-CM | POA: Insufficient documentation

## 2016-10-10 DIAGNOSIS — K642 Third degree hemorrhoids: Secondary | ICD-10-CM | POA: Diagnosis not present

## 2016-10-10 DIAGNOSIS — R011 Cardiac murmur, unspecified: Secondary | ICD-10-CM | POA: Insufficient documentation

## 2016-10-10 DIAGNOSIS — I5020 Unspecified systolic (congestive) heart failure: Secondary | ICD-10-CM | POA: Insufficient documentation

## 2016-10-10 DIAGNOSIS — Z87891 Personal history of nicotine dependence: Secondary | ICD-10-CM | POA: Insufficient documentation

## 2016-10-10 DIAGNOSIS — Z7982 Long term (current) use of aspirin: Secondary | ICD-10-CM | POA: Insufficient documentation

## 2016-10-10 DIAGNOSIS — Z952 Presence of prosthetic heart valve: Secondary | ICD-10-CM | POA: Insufficient documentation

## 2016-10-10 DIAGNOSIS — I480 Paroxysmal atrial fibrillation: Secondary | ICD-10-CM | POA: Insufficient documentation

## 2016-10-10 DIAGNOSIS — Z7902 Long term (current) use of antithrombotics/antiplatelets: Secondary | ICD-10-CM | POA: Diagnosis not present

## 2016-10-10 DIAGNOSIS — Z79899 Other long term (current) drug therapy: Secondary | ICD-10-CM | POA: Insufficient documentation

## 2016-10-10 DIAGNOSIS — D125 Benign neoplasm of sigmoid colon: Secondary | ICD-10-CM | POA: Insufficient documentation

## 2016-10-10 HISTORY — PX: COLONOSCOPY WITH PROPOFOL: SHX5780

## 2016-10-10 SURGERY — COLONOSCOPY WITH PROPOFOL
Anesthesia: Monitor Anesthesia Care | Laterality: Left

## 2016-10-10 MED ORDER — WARFARIN SODIUM 5 MG PO TABS: ORAL_TABLET | ORAL | 2 refills | Status: DC

## 2016-10-10 MED ORDER — EPHEDRINE SULFATE 50 MG/ML IJ SOLN
INTRAMUSCULAR | Status: DC | PRN
  Administered 2016-10-10: 10 mg via INTRAVENOUS

## 2016-10-10 MED ORDER — LIDOCAINE 2% (20 MG/ML) 5 ML SYRINGE
INTRAMUSCULAR | Status: DC | PRN
  Administered 2016-10-10: 40 mg via INTRAVENOUS

## 2016-10-10 MED ORDER — PROPOFOL 500 MG/50ML IV EMUL
INTRAVENOUS | Status: DC | PRN
  Administered 2016-10-10: 150 ug/kg/min via INTRAVENOUS
  Administered 2016-10-10: 10:00:00 via INTRAVENOUS

## 2016-10-10 MED ORDER — PROPOFOL 10 MG/ML IV BOLUS
INTRAVENOUS | Status: DC | PRN
  Administered 2016-10-10: 20 mg via INTRAVENOUS

## 2016-10-10 MED ORDER — LACTATED RINGERS IV SOLN: INTRAVENOUS | Status: DC

## 2016-10-10 NOTE — Interval H&P Note (Signed)
History and Physical Interval Note:  10/10/2016 10:01 AM  [name] Hinton  has presented today for surgery, with the diagnosis of hematochezia  The various methods of treatment have been discussed with the patient and family. After consideration of risks, benefits and other options for treatment, the patient has consented to  Procedure(s): COLONOSCOPY WITH PROPOFOL (Left) as a surgical intervention .  The patient's history has been reviewed, patient examined, no change in status, stable for surgery.  I have reviewed the patient's chart and labs.  Questions were answered to the patient's satisfaction.     [name]

## 2016-10-10 NOTE — Anesthesia Postprocedure Evaluation (Signed)
Anesthesia Post Note  Patient: [name] Hinton  Procedure(s) Performed: Procedure(s) (LRB): COLONOSCOPY WITH PROPOFOL (Left)  Patient location during evaluation: Endoscopy Anesthesia Type: MAC Level of consciousness: awake and alert Pain management: pain level controlled Vital Signs Assessment: post-procedure vital signs reviewed and stable Respiratory status: spontaneous breathing, nonlabored ventilation, respiratory function stable and patient connected to nasal cannula oxygen Cardiovascular status: stable and blood pressure returned to baseline Anesthetic complications: no       Last Vitals:  Vitals:   10/10/16 1100 10/10/16 1110  BP: (!) 148/76   Pulse: (!) 50 (!) 51  Resp: (!) 5 (!) 21  Temp:      Last Pain:  Vitals:   10/10/16 1045  TempSrc: Oral                 Reuven Braver,JAMES TERRILL     

## 2016-10-10 NOTE — H&P (View-Only) (Signed)
Referring Provider:   [place] Primary Care Physician:  Merrilee Seashore, MD Primary Gastroenterologist:  Althia Forts  Reason for Consultation:  Lower GI bleed  HPI: [name] Hinton is a 46 y.o. 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 Patient was found to have anemia with hemoglobin of 6.1. GI is consulted for further evaluation.  Patient seen and examined. According to patient he has been seen small amount of bright blood on the tissue paper as well as in the commode since more than 5 years. Described as blood with each bowel movement. Usually one to 2 bowel movements per day. He was doing fine until a month ago when he started feeling weakness and fatigue with minimal exertion. Denied any black stool. Denied any upper GI symptoms.  No family history of colon cancer. No previous colonoscopy.    Past Medical History:  Diagnosis Date  . Dysrhythmia   . ETOH abuse   . Heart murmur   . 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 Non-ischemic cardiomyopathy ([place])   . PAF (paroxysmal atrial fibrillation) ([place])    a. Dx 07/2013, s/p TEE/DCCV.  . Paroxysmal atrial flutter ([place])    a. Dx 07/2013.  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 S/P Maze operation for atrial fibrillation 09/27/2013   Complete bilateral atrial lesion set using bipolar radiofrequency and cryothermy ablation with clipping of LA appendage  . Severe aortic insufficiency    a. Bicuspid aortic valve with severe AI, dilated ascending aorta dx 07/2013.  [name] Systolic CHF ([place])    a. Dx 07/2013: EF 25%, felt likely related to combo of EtOH + tachy-mediated + AI. LHC pending.    Past Surgical History:  Procedure Laterality Date  . ASCENDING AORTIC ROOT REPLACEMENT N/A  09/27/2013   Procedure: ASCENDING AORTIC ROOT REPLACEMENT;  Surgeon: [name], MD;  Location: [place] [facility];  Service: Open Heart Surgery;  Laterality: N/A;  . BENTALL PROCEDURE N/A 09/27/2013   Procedure: BENTALL PROCEDURE;  Surgeon: [name], MD;  Location: [place];  Service: Open Heart Surgery;  Laterality: N/A;  . CARDIAC CATHETERIZATION    . CARDIOVERSION N/A 08/01/2013   Procedure: CARDIOVERSION;  Surgeon: [name], MD;  Location: [place];  Service: Cardiovascular;  Laterality: N/A;  . INTRAOPERATIVE TRANSESOPHAGEAL ECHOCARDIOGRAM N/A 09/27/2013   Procedure: INTRAOPERATIVE TRANSESOPHAGEAL ECHOCARDIOGRAM;  Surgeon: [name], MD;  Location: [place];  Service: Open Heart Surgery;  Laterality: N/A;  . LEFT AND RIGHT HEART CATHETERIZATION WITH CORONARY ANGIOGRAM N/A 09/05/2013   Procedure: LEFT AND RIGHT HEART CATHETERIZATION WITH CORONARY ANGIOGRAM;  Surgeon: [name], MD;  Location: [facility] CATH LAB;  Service: Cardiovascular;  Laterality: N/A;  . MAZE N/A 09/27/2013   Procedure: MAZE;  Surgeon: [name], MD;  Location: [place];  Service: Open Heart Surgery;  Laterality: N/A;  . NO PAST SURGERIES    . TEE WITHOUT CARDIOVERSION N/A 08/01/2013   Procedure: TRANSESOPHAGEAL ECHOCARDIOGRAM (TEE);  Surgeon: [name], MD;  Location: [place];  Service: Cardiovascular;  Laterality: N/A;    Prior to Admission medications   Medication Sig Start Date End Date Taking? Authorizing Provider  aspirin EC 81 MG tablet Take 1 tablet (81 mg total) by mouth daily. 10/27/13  Yes [name], MD  carvedilol (COREG) 12.5 MG tablet TAKE 1 TABLET BY MOUTH TWICE DAILY 04/23/16  Yes Jolaine Artist, MD  losartan (COZAAR) 50 MG tablet Take 75 mg by mouth daily.   Yes Historical Provider, MD  spironolactone (ALDACTONE) 25 MG tablet Take 1 tablet (25 mg total) by mouth daily. 09/15/16  Yes Jolaine Artist, MD  warfarin (COUMADIN) 5 MG tablet TAKE AS DIRECTED BY COUMADIN CLINIC  09/17/16  Yes [name], MD    Scheduled Meds: . aspirin EC  81 mg Oral Daily  . carvedilol  12.5 mg Oral BID WC  . losartan  75 mg Oral Daily  . pantoprazole  40 mg Oral Q0600  . spironolactone  25 mg Oral Daily   Continuous Infusions: PRN Meds:.  Allergies as of 10/04/2016 - Review Complete 10/04/2016  Allergen Reaction Noted  . Morphine and related Nausea Only 03/28/2016    Family History  Problem Relation Age of Onset  . Heart failure Father   . Coronary artery disease Father     CABG    Social History   Social History  . Marital status: Single    Spouse name: N/A  . Number of children: N/A  . Years of education: N/A   Occupational History  . Not on file.   Social History Main Topics  . Smoking status: Former Smoker    Packs/day: 1.50    Years: 20.00    Quit date: 09/25/2009  . Smokeless tobacco: Never Used  . Alcohol use Yes     Comment: 25 drinks/week - occasional in 2 months.only 1 drink in past 2 months-09/23/13  . Drug use: No  . Sexual activity: Not on file   Other Topics Concern  . Not on file   Social History Narrative  . No narrative on file    Review of Systems: All negative except as stated above in HPI. Review of Systems  Constitutional: Positive for malaise/fatigue. Negative for chills and fever.  HENT: Negative for ear pain, hearing loss and tinnitus.   Eyes: Negative for photophobia, pain and discharge.  Respiratory: Negative for cough, hemoptysis and sputum production.   Cardiovascular: Negative for chest pain and palpitations.  Gastrointestinal: Positive for blood in stool. Negative for abdominal pain, constipation, heartburn, nausea and vomiting.  Genitourinary: Negative for dysuria and urgency.  Musculoskeletal: Negative for myalgias and neck pain.  Skin: Negative for itching and rash.  Neurological: Positive for weakness. Negative for dizziness, speech change and focal weakness.  Endo/Heme/Allergies: Bruises/bleeds easily.   Psychiatric/Behavioral: Negative for hallucinations and suicidal ideas.    Physical Exam: Vital signs: Vitals:   10/05/16 0154 10/05/16 0430  BP: (!) 142/79 (!) 146/78  Pulse: 62 (!) 55  Resp: 16 16  Temp: 98.8 F (37.1 C) 98.7 F (37.1 C)     Physical Exam  Constitutional: He is oriented to person, place, and time and well-developed, well-nourished, and in no distress. No distress.  HENT:  Head: Normocephalic and atraumatic.  Mouth/Throat: Oropharynx is clear and moist.  Eyes: EOM are normal. No scleral icterus.  Neck: Normal range of motion. Neck supple. No thyromegaly present.  Cardiovascular: Normal rate and regular rhythm.  Exam reveals no friction rub.   Mechanical click noted  Pulmonary/Chest: Effort normal and breath sounds normal. No respiratory distress. He has no wheezes. He exhibits no tenderness.  Abdominal: Soft. Bowel sounds are normal. He exhibits no distension. There is no tenderness. There is no rebound and no guarding.  Musculoskeletal: Normal range of  motion. He exhibits no edema.  Neurological: He is alert and oriented to person, place, and time.  Skin: Skin is warm. No erythema.  Psychiatric: Mood, memory, affect and judgment normal.    GI:  Lab Results:  Recent Labs  10/04/16 1751  WBC 6.7  HGB 6.1*  HCT 21.5*  PLT 268   BMET  Recent Labs  10/04/16 1751  NA 136  K 4.9  CL 100*  CO2 27  GLUCOSE 104*  BUN 15  CREATININE 1.00  CALCIUM 9.0   LFT No results for input(s): PROT, ALBUMIN, AST, ALT, ALKPHOS, BILITOT, BILIDIR, IBILI in the last 72 hours. PT/INR  Recent Labs  10/04/16 1905  LABPROT 24.7*  INR 2.19     Studies/Results: Dg Chest 2 View  Result Date: 10/04/2016 CLINICAL DATA:  Fatigue dizziness and lightheadedness. EXAM: CHEST  2 VIEW COMPARISON:  10/17/2013 FINDINGS: Postsurgical changes from aortic valve replacement and left atrial appendage clamp are stable. The cardiac silhouette is enlarged, stable. Mediastinal  contours appear intact. Tortuosity of the aorta. There is no evidence of focal airspace consolidation, pleural effusion or pneumothorax. Osseous structures are without acute abnormality. Soft tissues are grossly normal. IMPRESSION: Enlarged cardiac silhouette which may represent cardiomegaly or pericardial effusion. No pulmonary edema or airspace consolidation. Electronically Signed   By: Fidela Salisbury M.D.   On: 10/04/2016 18:32    Impression/Plan: - Bright red blood per rectum with each bowel movement since last several years. Most likely from hemorrhoids - Acute blood loss anemia with hemoglobin of 6.1 on admission - History of mechanical aortic wall. Was on Coumadin. Last dose yesterday. INR 2.19 on admission - History of paroxysmal atrial fibrillation. Currently in sinus rhythm  Recommendations ------------------------- - Monitor H&H and INR. - Patient will need colonoscopy for further evaluation. Risk benefits and alternatives discussed with the patient. Verbalized understanding. - INR needs to be less than 1.7 before we can start prep for colonoscopy. - Clear liquid diet for today. Nothing by mouth past midnight. - GI will follow. Thank you for consultation    LOS: 0 days   Otis Brace  MD, FACP 10/05/2016, 8:54 AM  Pager [phone] If no answer or after 5 PM call [phone]

## 2016-10-10 NOTE — Transfer of Care (Signed)
Immediate Anesthesia Transfer of Care Note  Patient: [name] Hinton  Procedure(s) Performed: Procedure(s): COLONOSCOPY WITH PROPOFOL (Left)  Patient Location: Endoscopy Unit  Anesthesia Type:MAC  Level of Consciousness: sedated  Airway & Oxygen Therapy: Patient Spontanous Breathing  Post-op Assessment: Report given to RN and Post -op Vital signs reviewed and stable  Post vital signs: Reviewed and stable  Last Vitals:  Vitals:   10/10/16 0940 10/10/16 1045  BP: 134/64 (!) 80/56  Pulse:  (!) 46  Resp:  16  Temp:  36.4 C    Last Pain:  Vitals:   10/10/16 1045  TempSrc: Oral         Complications: No apparent anesthesia complications

## 2016-10-10 NOTE — Discharge Instructions (Signed)

## 2016-10-10 NOTE — Anesthesia Preprocedure Evaluation (Addendum)
Anesthesia Evaluation  Patient identified by MRN, date of birth, ID band Patient awake    Reviewed: Allergy & Precautions, NPO status , Patient's Chart, lab work & pertinent test results  Airway Mallampati: I  TM Distance: >3 FB Neck ROM: Full    Dental  (+) Teeth Intact   Pulmonary former smoker,    breath sounds clear to auscultation       Cardiovascular + dysrhythmias + Valvular Problems/Murmurs  Rhythm:Regular Rate:Normal  Bentall procedure years ago    Neuro/Psych    GI/Hepatic Gi bleed   Endo/Other    Renal/GU      Musculoskeletal   Abdominal   Peds  Hematology  (+) anemia ,   Anesthesia Other Findings   Reproductive/Obstetrics                            Anesthesia Physical Anesthesia Plan  ASA: II  Anesthesia Plan: MAC   Post-op Pain Management:    Induction: Intravenous  Airway Management Planned: Natural Airway and Simple Face Mask  Additional Equipment:   Intra-op Plan:   Post-operative Plan:   Informed Consent: I have reviewed the patients History and Physical, chart, labs and discussed the procedure including the risks, benefits and alternatives for the proposed anesthesia with the patient or authorized representative who has indicated his/her understanding and acceptance.     Plan Discussed with: CRNA  Anesthesia Plan Comments:         Anesthesia Quick Evaluation

## 2016-10-10 NOTE — Op Note (Signed)
[facility] Patient Name: [name] Hinton Procedure Date : 10/10/2016 MRN: [id number] Attending MD: Arta Silence , MD Date of Birth: [birth year] CSN: [id number] Age: 46 Admit Type: Outpatient Procedure:                Colonoscopy Indications:              This is the patient's first colonoscopy,                            Hematochezia Providers:                Arta Silence, MD, Vista Lawman, RN, Cletis Athens,                            Technician Referring MD:             Merrilee Seashore, MD Medicines:                Monitored Anesthesia Care Complications:            No immediate complications. Estimated Blood Loss:     Estimated blood loss: none. Procedure:                Pre-Anesthesia Assessment:                           - Prior to the procedure, a History and Physical                            was performed, and patient medications and                            allergies were reviewed. The patient's tolerance of                            previous anesthesia was also reviewed. The risks                            and benefits of the procedure and the sedation                            options and risks were discussed with the patient.                            All questions were answered, and informed consent                            was obtained. Prior Anticoagulants: The patient has                            taken Lovenox (enoxaparin), last dose was 1 day                            prior to procedure. ASA Grade Assessment: II - A  patient with mild systemic disease. After reviewing                            the risks and benefits, the patient was deemed in                            satisfactory condition to undergo the procedure.                           After obtaining informed consent, the colonoscope                            was passed under direct vision. Throughout the                            procedure, the  patient's blood pressure, pulse, and                            oxygen saturations were monitored continuously. The                            EC-[id number] ([id number]) scope was introduced through                            the anus and advanced to the the cecum, identified                            by appendiceal orifice and ileocecal valve. The                            ileocecal valve, appendiceal orifice, and rectum                            were photographed. The entire colon was examined.                            The colonoscopy was performed without difficulty.                            The patient tolerated the procedure well. The                            quality of the bowel preparation was good. Scope In: 10:14:29 AM Scope Out: 10:37:43 AM Scope Withdrawal Time: 0 hours 20 minutes 4 seconds  Total Procedure Duration: 0 hours 23 minutes 14 seconds  Findings:      The perianal exam findings include internal hemorrhoids that prolapse       with straining, but require manual replacement into the anal canal       (Grade III).      Internal hemorrhoids were found during retroflexion. The hemorrhoids       were moderate.      A 8 mm polyp was found in the sigmoid colon. The polyp was pedunculated.       The polyp was removed with a  hot snare. Resection and retrieval were       complete. To prevent bleeding after the polypectomy, one hemostatic clip       was successfully placed. There was no bleeding at the end of the       procedure.      Colon otherwise normal; no other polyps, masses, vascular ectasias, or       inflammatory changes were seen.      No additional abnormalities were found on retroflexion. Impression:               - Internal hemorrhoids that prolapse with                            straining, but require manual replacement into the                            anal canal (Grade III) found on perianal exam.                           - Internal hemorrhoids.                            - One 8 mm polyp in the sigmoid colon, removed with                            a hot snare. Resected and retrieved. Clip was                            placed.                           - The examination was otherwise normal. Moderate Sedation:      None Recommendation:           - Patient has a contact number available for                            emergencies. The signs and symptoms of potential                            delayed complications were discussed with the                            patient. Return to normal activities tomorrow.                            Written discharge instructions were provided to the                            patient.                           - Discharge patient to home (via wheelchair).                           - Resume previous diet today.                           -  Continue present medications.                           - Resume Lovenox (enoxaparin) at prior dose now.                           - Resume Coumadin (warfarin) at prior dose tomorrow.                           - Await pathology results.                           - Repeat colonoscopy in 5 years for surveillance                            based on pathology results.                           - Topical therapies (e.g., Preparation-H),                            avoidance of constipation/straining, liberal water                            intake, for management of hemorrhoids.                           - Return to GI clinic PRN.                           - Return to referring physician PRN. Procedure Code(s):        --- Professional ---                           [phone], Colonoscopy, flexible; with removal of                            tumor(s), polyp(s), or other lesion(s) by snare                            technique Diagnosis Code(s):        --- Professional ---                           K64.2, Third degree hemorrhoids                           D12.5, Benign neoplasm  of sigmoid colon                           K92.1, Melena (includes Hematochezia) CPT copyright 2016 [employer] Association. All rights reserved. The codes documented in this report are preliminary and upon coder review may  be revised to meet current compliance requirements. Arta Silence, MD 10/10/2016 10:49:34 AM This report has been signed electronically. Number of Addenda: 0

## 2016-10-15 NOTE — Anesthesia Postprocedure Evaluation (Addendum)
Anesthesia Post Note  Patient: [name] Hinton  Procedure(s) Performed: Procedure(s) (LRB): COLONOSCOPY WITH PROPOFOL (Left)  Patient location during evaluation: Endoscopy Anesthesia Type: MAC Level of consciousness: awake and alert Pain management: pain level controlled Vital Signs Assessment: post-procedure vital signs reviewed and stable Respiratory status: spontaneous breathing, nonlabored ventilation, respiratory function stable and patient connected to nasal cannula oxygen Cardiovascular status: stable and blood pressure returned to baseline Anesthetic complications: no       Last Vitals:  Vitals:   10/10/16 1100 10/10/16 1110  BP: (!) 148/76   Pulse: (!) 50 (!) 51  Resp: (!) 5 (!) 21  Temp:      Last Pain:  Vitals:   10/10/16 1045  TempSrc: Oral                 [name],JAMES TERRILL

## 2016-10-16 ENCOUNTER — Ambulatory Visit (INDEPENDENT_AMBULATORY_CARE_PROVIDER_SITE_OTHER): Payer: 59 | Admitting: Pharmacist

## 2016-10-16 DIAGNOSIS — I359 Nonrheumatic aortic valve disorder, unspecified: Secondary | ICD-10-CM

## 2016-10-16 DIAGNOSIS — Z9889 Other specified postprocedural states: Secondary | ICD-10-CM | POA: Diagnosis not present

## 2016-10-16 DIAGNOSIS — I351 Nonrheumatic aortic (valve) insufficiency: Secondary | ICD-10-CM | POA: Diagnosis not present

## 2016-10-16 DIAGNOSIS — Z8679 Personal history of other diseases of the circulatory system: Secondary | ICD-10-CM | POA: Diagnosis not present

## 2016-10-16 LAB — POCT INR: INR: 1.7

## 2016-10-16 MED ORDER — ENOXAPARIN SODIUM 80 MG/0.8ML ~~LOC~~ SOLN: 70.0000 mg | Freq: Two times a day (BID) | SUBCUTANEOUS | 0 refills | Status: DC

## 2016-10-17 ENCOUNTER — Emergency Department (HOSPITAL_COMMUNITY)
Admission: EM | Admit: 2016-10-17 | Discharge: 2016-10-17 | Disposition: A | Discharge status: Home / Self Care | Payer: 59 | Attending: Emergency Medicine | Admitting: Emergency Medicine

## 2016-10-17 ENCOUNTER — Telehealth: Payer: Self-pay | Admitting: Student

## 2016-10-17 ENCOUNTER — Encounter (HOSPITAL_COMMUNITY): Payer: Self-pay

## 2016-10-17 DIAGNOSIS — Z7901 Long term (current) use of anticoagulants: Secondary | ICD-10-CM | POA: Insufficient documentation

## 2016-10-17 DIAGNOSIS — Z7982 Long term (current) use of aspirin: Secondary | ICD-10-CM | POA: Insufficient documentation

## 2016-10-17 DIAGNOSIS — I5022 Chronic systolic (congestive) heart failure: Secondary | ICD-10-CM | POA: Insufficient documentation

## 2016-10-17 DIAGNOSIS — Z87891 Personal history of nicotine dependence: Secondary | ICD-10-CM | POA: Insufficient documentation

## 2016-10-17 DIAGNOSIS — Z79899 Other long term (current) drug therapy: Secondary | ICD-10-CM | POA: Insufficient documentation

## 2016-10-17 DIAGNOSIS — K625 Hemorrhage of anus and rectum: Secondary | ICD-10-CM | POA: Diagnosis not present

## 2016-10-17 LAB — COMPREHENSIVE METABOLIC PANEL
ALT: 95 U/L — ABNORMAL HIGH (ref 17–63)
AST: 50 U/L — ABNORMAL HIGH (ref 15–41)
Albumin: 4.4 g/dL (ref 3.5–5.0)
Alkaline Phosphatase: 44 U/L (ref 38–126)
Anion gap: 9 (ref 5–15)
BUN: 13 mg/dL (ref 6–20)
CO2: 25 mmol/L (ref 22–32)
Calcium: 9.1 mg/dL (ref 8.9–10.3)
Chloride: 108 mmol/L (ref 101–111)
Creatinine, Ser: 0.8 mg/dL (ref 0.61–1.24)
GFR calc Af Amer: >60 mL/min (ref >60)
GFR calc non Af Amer: >60 mL/min (ref >60)
Glucose, Bld: 98 mg/dL (ref 65–99)
Potassium: 4.4 mmol/L (ref 3.5–5.1)
Sodium: 142 mmol/L (ref 135–145)
Total Bilirubin: 0.6 mg/dL (ref 0.3–1.2)
Total Protein: 6.9 g/dL (ref 6.5–8.1)

## 2016-10-17 LAB — CBC
HCT: 34.3 % — ABNORMAL LOW (ref 39.0–52.0)
Hemoglobin: 10.2 g/dL — ABNORMAL LOW (ref 13.0–17.0)
MCH: 23.2 pg — ABNORMAL LOW (ref 26.0–34.0)
MCHC: 29.7 g/dL — ABNORMAL LOW (ref 30.0–36.0)
MCV: 78 fL (ref 78.0–100.0)
Platelets: 275 10*3/uL (ref 150–400)
RBC: 4.4 MIL/uL (ref 4.22–5.81)
RDW: 24.6 % — ABNORMAL HIGH (ref 11.5–15.5)
WBC: 7.2 10*3/uL (ref 4.0–10.5)

## 2016-10-17 LAB — PROTIME-INR
INR: 1.87
Prothrombin Time: 21.8 seconds — ABNORMAL HIGH (ref 11.4–15.2)

## 2016-10-17 LAB — TYPE AND SCREEN
ABO/RH(D): B POS
Antibody Screen: NEGATIVE

## 2016-10-17 NOTE — Telephone Encounter (Signed)
   Patient called reporting he is having significant rectal bleeding. Spoke with GI earlier who recommended he come to the ER as he reports associated dizziness. He called the Cardiology after-hours service line just to make Dr. Aundra Dubin aware. Will forward to him. Advised patient to continue with plans to proceed to the ER with his continued bleeding and worsening dizziness.   Signed, Erma Heritage, PA-C 10/17/2016, 6:05 PM

## 2016-10-17 NOTE — Discharge Instructions (Signed)
Take miralax daily to soften stools. Use hemorrhoid creams provided. Follow up with gastroenterologist Monday morning for blood work repeat. Return to emergency department if bleeding is heavier, if become more dizzy, weak, lightheaded, or any new concerning symptoms.

## 2016-10-17 NOTE — ED Triage Notes (Signed)
Pt endorses rectal bleeding and weakness x 5 days. Pt had colonoscopy last Friday for the same. Bleeding stopped for 2 days and then restarted on Monday which also is when the patient restarted taking his coumadin. Pt states "I wouldn't be worried about it if it wasn't significant blood loss." VSS.

## 2016-10-17 NOTE — ED Provider Notes (Signed)
[facility] DEPT Provider Note   CSN: [id number] Arrival date & time: 10/17/16  [place]     History   Chief Complaint Chief Complaint  Patient presents with  . Rectal Bleeding    HPI [name] Hinton is a 46 y.o. male.  HPI [name] Hinton is a 46 y.o. male with history of arrhythmia, paroxysmal A. 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 Patient states that he was discharged from the hospital for the same 11 days ago. He had colonoscopy one week ago which showed internal hemorrhoids. Patient states that he start bleeding again about 3 days ago. He states small amount of bright red blood in the stool. He states that during last admission he did require transfusion, hemoglobin went down to 6.1. He states today he was not feeling well, states he does not feel dizzy but states "just don't feel very good." He called his gastroenterologist who told him to come to the ED for evaluation. Patient denies any abdominal pain. No rectal pain. Denies any other associated symptoms.  Past Medical History:  Diagnosis Date  . Dysrhythmia   . ETOH abuse   . Heart murmur   . 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 Non-ischemic cardiomyopathy ([place])   . PAF (paroxysmal atrial fibrillation) ([place])    a. Dx 07/2013, s/p TEE/DCCV.  . Paroxysmal atrial flutter ([place])    a. Dx 07/2013.  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 S/P Maze operation for atrial fibrillation 09/27/2013   Complete bilateral atrial lesion set using bipolar radiofrequency and cryothermy ablation with clipping of LA appendage  . Severe aortic insufficiency    a. Bicuspid aortic valve with severe AI, dilated ascending aorta dx 07/2013.  [name] Systolic CHF ([place])    a. Dx 07/2013: EF 25%, felt likely related to combo of  EtOH + tachy-mediated + AI. LHC pending.    Patient Active Problem List   Diagnosis Date Noted  . Lower GI bleed   . Symptomatic anemia 10/04/2016  . Amaurosis fugax 10/29/2013  . Aortic valve disorders 10/05/2013  . 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 S/P Maze operation for atrial fibrillation 09/27/2013  . 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 Non-ischemic cardiomyopathy ([place])   . Chronic systolic CHF (congestive heart failure) ([place]) 08/10/2013  . Bicuspid aortic valve 08/10/2013  . AF (paroxysmal atrial fibrillation) ([place]) 08/10/2013    Past Surgical History:  Procedure Laterality Date  . ASCENDING AORTIC ROOT REPLACEMENT N/A 09/27/2013   Procedure: ASCENDING AORTIC ROOT REPLACEMENT;  Surgeon: [name], MD;  Location: [facility];  Service: Open Heart Surgery;  Laterality: N/A;  . BENTALL PROCEDURE N/A 09/27/2013   Procedure: BENTALL PROCEDURE;  Surgeon: [name], MD;  Location: [place] [facility];  Service: Open Heart Surgery;  Laterality: N/A;  . CARDIAC CATHETERIZATION    . CARDIOVERSION N/A 08/01/2013   Procedure: CARDIOVERSION;  Surgeon: [name], MD;  Location: [place];  Service: Cardiovascular;  Laterality: N/A;  . COLONOSCOPY WITH PROPOFOL Left 10/10/2016   Procedure: COLONOSCOPY WITH PROPOFOL;  Surgeon: Arta Silence, MD;  Location[facility] ENDOSCOPY;  Service: Endoscopy;  Laterality: Left;  . INTRAOPERATIVE TRANSESOPHAGEAL ECHOCARDIOGRAM N/A 09/27/2013   Procedure: INTRAOPERATIVE TRANSESOPHAGEAL ECHOCARDIOGRAM;  Surgeon: [name],  MD;  Location: [facility] OR;  Service: Open Heart Surgery;  Laterality: N/A;  . LEFT AND RIGHT HEART CATHETERIZATION WITH CORONARY ANGIOGRAM N/A 09/05/2013   Procedure: LEFT AND RIGHT HEART CATHETERIZATION WITH CORONARY ANGIOGRAM;  Surgeon: [name], MD;  Location: [facility] CATH LAB;  Service: Cardiovascular;  Laterality: N/A;  . MAZE N/A 09/27/2013   Procedure: MAZE;   Surgeon: [name], MD;  Location: [place];  Service: Open Heart Surgery;  Laterality: N/A;  . NO PAST SURGERIES    . TEE WITHOUT CARDIOVERSION N/A 08/01/2013   Procedure: TRANSESOPHAGEAL ECHOCARDIOGRAM (TEE);  Surgeon: [name], MD;  Location: [place];  Service: Cardiovascular;  Laterality: N/A;       Home Medications    Prior to Admission medications   Medication Sig Start Date End Date Taking? Authorizing Provider  aspirin EC 81 MG tablet Take 1 tablet (81 mg total) by mouth daily. 10/27/13  Yes [name], MD  carvedilol (COREG) 12.5 MG tablet TAKE 1 TABLET BY MOUTH TWICE DAILY 04/23/16  Yes Jolaine Artist, MD  enoxaparin (LOVENOX) 80 MG/0.8ML injection Inject 0.7 mLs (70 mg total) into the skin every 12 (twelve) hours. 10/16/16 10/21/16 Yes [name], MD  losartan (COZAAR) 50 MG tablet TAKE 1 AND 1/2 TABLETS BY MOUTH TWICE DAILY 10/10/16  Yes Jolaine Artist, MD  pantoprazole (PROTONIX) 40 MG tablet Take 1 tablet (40 mg total) by mouth daily. 10/06/16  Yes Ripudeep Krystal Eaton, MD  spironolactone (ALDACTONE) 25 MG tablet Take 1 tablet (25 mg total) by mouth daily. 09/15/16  Yes Jolaine Artist, MD  warfarin (COUMADIN) 5 MG tablet HOLD UNTIL FURTHER DIRECTIONS FROM DR [facility] OR COUMADIN CLINIC Patient taking differently: Take 5-7.5 mg by mouth daily at 6 PM. 7.5mg  on Sun Tue Wed Thur Sat 5mg  Mon Fri 10/11/16  Yes Arta Silence, MD    Family History Family History  Problem Relation Age of Onset  . Heart failure Father   . Coronary artery disease Father     CABG    Social History Social History  Substance Use Topics  . Smoking status: Former Smoker    Packs/day: 1.50    Years: 20.00    Quit date: 09/25/2009  . Smokeless tobacco: Never Used  . Alcohol use Yes     Comment: glass of wine every night     Allergies   Morphine and related   Review of Systems Review of Systems  Constitutional: Positive for fatigue. Negative for chills and fever.    Respiratory: Negative for cough, chest tightness and shortness of breath.   Cardiovascular: Negative for chest pain, palpitations and leg swelling.  Gastrointestinal: Positive for blood in stool. Negative for abdominal distention, abdominal pain, diarrhea, nausea and vomiting.  Musculoskeletal: Negative for arthralgias, myalgias, neck pain and neck stiffness.  Skin: Negative for rash.  Allergic/Immunologic: Negative for immunocompromised state.  Neurological: Positive for weakness. Negative for dizziness, light-headedness, numbness and headaches.  All other systems reviewed and are negative.    Physical Exam Updated Vital Signs BP 126/80   Pulse 62   Temp 98.3 F (36.8 C) (Oral)   Resp 19   Ht 5\' 8"  (1.727 m)   Wt 73.5 kg   SpO2 99%   BMI 24.63 kg/m   Physical Exam  Constitutional: He appears well-developed and well-nourished. No distress.  HENT:  Head: Normocephalic and atraumatic.  Eyes: Conjunctivae are normal.  Neck: Neck supple.  Cardiovascular: Normal rate, regular rhythm and normal  heart sounds.   Pulmonary/Chest: Effort normal. No respiratory distress. He has no wheezes. He has no rales.  Abdominal: Soft. Bowel sounds are normal. He exhibits no distension. There is no tenderness. There is no rebound.  Musculoskeletal: He exhibits no edema.  Neurological: He is alert.  Skin: Skin is warm and dry.  Nursing note and vitals reviewed.    ED Treatments / Results  Labs (all labs ordered are listed, but only abnormal results are displayed) Labs Reviewed  COMPREHENSIVE METABOLIC PANEL - Abnormal; Notable for the following:       Result Value   AST 50 (*)    ALT 95 (*)    All other components within normal limits  CBC - Abnormal; Notable for the following:    Hemoglobin 10.2 (*)    HCT 34.3 (*)    MCH 23.2 (*)    MCHC 29.7 (*)    RDW 24.6 (*)    All other components within normal limits  PROTIME-INR - Abnormal; Notable for the following:    Prothrombin Time  21.8 (*)    All other components within normal limits  TYPE AND SCREEN    EKG  EKG Interpretation None       Radiology No results found.  Procedures Procedures (including critical care time)  Medications Ordered in ED Medications - No data to display   Initial Impression / Assessment and Plan / ED Course  I have reviewed the triage vital signs and the nursing notes.  Pertinent labs & imaging results that were available during my care of the patient were reviewed by me and considered in my medical decision making (see chart for details).     Pt with bright red blood in stool for 3-4 days. Hx of the same. Colonoscopy 1 week ago showing internal hemorrhoids and polypectomy. Will recheck labs, H&H and INR.   Pt's HGB is 10.2, actually higher than his dc hgb after transfusion. VS normal, not orthostatic. No hemorrhaging. Will consult GI. Discussed with Dr. Oleta Mouse, agrees.     10:34 PM Discussed with Dr. Oletta Lamas, GI, plan to DC home with stool softner, hemorrhoid treatment. He will follow up Monday morning in their clinic for H&H check. Return if worsening. PT agrees. Stable for dc home. Normal VS.   Vitals:   10/17/16 2145 10/17/16 2200 10/17/16 2215 10/17/16 2230  BP: 119/71 113/71 110/66 119/70  Pulse: (!) 58 61 61 64  Resp: (!) 23 16 (!) 24 16  Temp:      TempSrc:      SpO2: 97% 98% 92% 98%  Weight:      Height:          Final Clinical Impressions(s) / ED Diagnoses   Final diagnoses:  Rectal bleeding    New Prescriptions Discharge Medication List as of 10/17/2016 10:37 PM       Jeannett Senior, PA-C 10/18/16 0032    Forde Dandy, MD 10/18/16 1157

## 2016-10-17 NOTE — ED Notes (Signed)
Pt verbalized understanding of d/c instructions and has no further questions. Pt stable and NAD. VSS. Pt is to follow up with GI Monday morning to recheck hgb.

## 2016-10-21 ENCOUNTER — Ambulatory Visit (INDEPENDENT_AMBULATORY_CARE_PROVIDER_SITE_OTHER): Payer: 59 | Admitting: *Deleted

## 2016-10-21 DIAGNOSIS — Z9889 Other specified postprocedural states: Secondary | ICD-10-CM | POA: Diagnosis not present

## 2016-10-21 DIAGNOSIS — Z8679 Personal history of other diseases of the circulatory system: Secondary | ICD-10-CM | POA: Diagnosis not present

## 2016-10-21 DIAGNOSIS — I359 Nonrheumatic aortic valve disorder, unspecified: Secondary | ICD-10-CM | POA: Diagnosis not present

## 2016-10-21 DIAGNOSIS — I351 Nonrheumatic aortic (valve) insufficiency: Secondary | ICD-10-CM | POA: Diagnosis not present

## 2016-10-21 LAB — POCT INR: INR: 3.8

## 2016-10-29 ENCOUNTER — Encounter (HOSPITAL_COMMUNITY): Payer: Self-pay

## 2016-11-07 ENCOUNTER — Ambulatory Visit (HOSPITAL_COMMUNITY)
Admission: RE | Admit: 2016-11-07 | Discharge: 2016-11-07 | Disposition: A | Discharge status: Home / Self Care | Payer: 59 | Source: Ambulatory Visit | Attending: Cardiology | Admitting: Cardiology

## 2016-11-07 VITALS — BP 116/66 | HR 68 | Wt 166.2 lb

## 2016-11-07 DIAGNOSIS — Z952 Presence of prosthetic heart valve: Secondary | ICD-10-CM | POA: Diagnosis not present

## 2016-11-07 DIAGNOSIS — K922 Gastrointestinal hemorrhage, unspecified: Secondary | ICD-10-CM | POA: Insufficient documentation

## 2016-11-07 DIAGNOSIS — Z87891 Personal history of nicotine dependence: Secondary | ICD-10-CM | POA: Insufficient documentation

## 2016-11-07 DIAGNOSIS — I110 Hypertensive heart disease with heart failure: Secondary | ICD-10-CM | POA: Diagnosis not present

## 2016-11-07 DIAGNOSIS — Z7982 Long term (current) use of aspirin: Secondary | ICD-10-CM | POA: Insufficient documentation

## 2016-11-07 DIAGNOSIS — I4892 Unspecified atrial flutter: Secondary | ICD-10-CM | POA: Insufficient documentation

## 2016-11-07 DIAGNOSIS — Z7901 Long term (current) use of anticoagulants: Secondary | ICD-10-CM | POA: Insufficient documentation

## 2016-11-07 DIAGNOSIS — Q231 Congenital insufficiency of aortic valve: Secondary | ICD-10-CM | POA: Insufficient documentation

## 2016-11-07 DIAGNOSIS — Z8249 Family history of ischemic heart disease and other diseases of the circulatory system: Secondary | ICD-10-CM | POA: Diagnosis not present

## 2016-11-07 DIAGNOSIS — F1011 Alcohol abuse, in remission: Secondary | ICD-10-CM | POA: Insufficient documentation

## 2016-11-07 DIAGNOSIS — I359 Nonrheumatic aortic valve disorder, unspecified: Secondary | ICD-10-CM | POA: Diagnosis not present

## 2016-11-07 DIAGNOSIS — I5022 Chronic systolic (congestive) heart failure: Secondary | ICD-10-CM | POA: Insufficient documentation

## 2016-11-07 DIAGNOSIS — I712 Thoracic aortic aneurysm, without rupture: Secondary | ICD-10-CM | POA: Diagnosis not present

## 2016-11-07 LAB — CBC
HCT: 28.5 % — ABNORMAL LOW (ref 39.0–52.0)
Hemoglobin: 8.4 g/dL — ABNORMAL LOW (ref 13.0–17.0)
MCH: 22.3 pg — ABNORMAL LOW (ref 26.0–34.0)
MCHC: 29.5 g/dL — ABNORMAL LOW (ref 30.0–36.0)
MCV: 75.6 fL — ABNORMAL LOW (ref 78.0–100.0)
Platelets: 263 10*3/uL (ref 150–400)
RBC: 3.77 MIL/uL — ABNORMAL LOW (ref 4.22–5.81)
RDW: 21.3 % — ABNORMAL HIGH (ref 11.5–15.5)
WBC: 5.7 10*3/uL (ref 4.0–10.5)

## 2016-11-07 LAB — IRON AND TIBC
Iron: 16 ug/dL — ABNORMAL LOW (ref 45–182)
Saturation Ratios: 3 % — ABNORMAL LOW (ref 17.9–39.5)
TIBC: 533 ug/dL — ABNORMAL HIGH (ref 250–450)
UIBC: 517 ug/dL

## 2016-11-07 LAB — BASIC METABOLIC PANEL
Anion gap: 7 (ref 5–15)
BUN: 12 mg/dL (ref 6–20)
CO2: 28 mmol/L (ref 22–32)
Calcium: 9.3 mg/dL (ref 8.9–10.3)
Chloride: 105 mmol/L (ref 101–111)
Creatinine, Ser: 0.88 mg/dL (ref 0.61–1.24)
GFR calc Af Amer: >60 mL/min (ref >60)
GFR calc non Af Amer: >60 mL/min (ref >60)
Glucose, Bld: 117 mg/dL — ABNORMAL HIGH (ref 65–99)
Potassium: 4.2 mmol/L (ref 3.5–5.1)
Sodium: 140 mmol/L (ref 135–145)

## 2016-11-07 LAB — FERRITIN: Ferritin: 13 ng/mL — ABNORMAL LOW (ref 24–336)

## 2016-11-07 MED ORDER — HYDROCORTISONE 2.5 % RE CREA: 1.0000 "application " | TOPICAL_CREAM | Freq: Two times a day (BID) | RECTAL | 0 refills | Status: DC

## 2016-11-07 MED ORDER — DOCUSATE SODIUM 100 MG PO CAPS: 100.0000 mg | ORAL_CAPSULE | Freq: Two times a day (BID) | ORAL | 0 refills | Status: DC

## 2016-11-07 NOTE — Patient Instructions (Signed)
Labs today  Use Anusol cream as ordered by GI  Take Colace 100 mg daily, can get this over the counter  We will contact you in 3 months to schedule your next appointment.

## 2016-11-08 NOTE — [place] Notes (Signed)
Patient ID: [name] Hinton, male   DOB: 30-Jun-1971, 46 y.o.   MRN: [id number] PCP: Dr. Ashby Dawes Cardiology: Dr. Aundra Dubin  Mr Schiffman is a 46 year old with a history of alcohol abuse and former smoker (quit  2011). Hospitalized 1/15 with increased dyspnea-->ECHO performed and showed EF 25%, global hypokinesis, RV mildly dilated,severe aortic insufficiency.  Patient had new-onset acute systolic CHF and atrial flutter. Started on cardizem drip and Xarelto. Initially in A flutter--> became A fib.   He had TEE and DC-CV on 08/01/13.  Dr Roxy Manns provided consultation due to ascending aortic aneurysm and bicuspid valve with severe AI. He was discharged on ACEI, beta blocker, spironolactone, and xarelto. Discharge weight 150 pounds.    Had cough with lisinopril and switched to losartan.  LHC/RHC was done 2/15 showing no coronary disease and elevated PCWP with pulmonary hypertension.   In 3/15, he had replacement of ascending aortic and aortic valve with Bentall procedure with mechanical aortic valve.  He also had Maze procedure and LAA obliteration.  He was noted to have gone into atypical atrial flutter by the time of his 3/30 followup with Dr. Roxy Manns.  He was started on amiodarone and went back into NSR. He is back at work.  Echo in 4/15 showed normally functioning mechanical aortic valve, but EF remained 30-35%.   Echo in 4/15 showed normally functioning mechanical aortic valve, but EF remained 30-35%.  ECHO 01/2014  EF up to 45% with normal mechanical aortic valve.  Echo 9/17 with increase in EF to 55-60% with mean gradient 13 mmHg across mechanical AoV.    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  No other neurological symptoms.   In 3/18, he was admitted with lower GI bleeding/symptomatic anemia.  He received 2 units PRBCs.  Suspected to be hemorrhoidal.  He had a colonoscopy confirming hemorrhoids and showing 1 polyp that was removed.  He was told to start a stool softener and Anusol but  has not been taking regularly. Still has occasional episodes of BRBPR.  No exertional dyspnea but feels a bit more fatigued generally than his baseline. No lightheadedness.   Labs (1/15): K 5.2, creatinine 1.3, AST 40, ALT 58 Labs (2/15): K 4.7, creatinine 1.1 Labs (4/15): K 4.9, creatinine 1.0, LFTs normal, TSH normal Labs (5/15): K 4.6, creatinine 1.1, BNP 134 Labs (6/15): K 4.8  creatinine 1.0. INR 2.4 Labs (03/01/14): K 4.7 Creatinine 1.16  Labs (11/15): K 4.8, creatinine 0.79 Labs (3/18): K 4.4, creatinine 0.8, hgb 10.2  PMH:  1. A fib/Aflutter: Initially flutter during 1/15 admission, degenerated to fibrillation --> S/P TEE DC-CV to NSR. Atypical atrial flutter recurred post-op in 3/15. 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  2. 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  TEE (1/15) with EF 25%, moderate LV dilation, bicuspid aortic valve with severe eccentric AI and no AS, ascending aorta 5.0 cm, moderately decreased RV systolic function.  CTA chest (1/15) with 5.1 cm ascending aortic aneurysm. Patient had Bentall replacement of ascending aorta with mechanical aortic valve and Maze with LAA obliteration in 3/15.  3. Cardiomyopathy with systolic CHF: As above, TEE with EF 25% and moderately dilated LV, moderately decreased RV systolic function.  Tachy-mediated CMP versus ETOH CMP versus long-standing AI versus a combination of the three.  LHC/RHC (2/15): no coronary disease, mean RA 5, PA 62/25, mean PCWP 22, CI 1.8, PVR 5.2 WU, LVEDP 35 (severe AI).  Echo (2/15):  EF 35-40%, moderately dilated LV, bicuspid aortic valve with severe AI.  Echo (4/15) with mildly dilated LV, mild LVH, EF 30-35%, normal RV size and systolic function, mechanical aortic valve appeared to function normally, IVC small.  Echo (7/15) with EF 45%, normal mechanical aortic valve, normal RV size with mildly decreased systolic function.  - Echo (9/17) with EF 55-60%,  mechanical aortic valve with mean gradient 13 mmHg.  4. Former smoker quit 2011 5. Alcohol abuse but has cut back considerably.  6. ACEI cough 7. Palpitations: Holter monitor (12/15) with occasional PVCs and PACs.   FH: Father- Heart Failure, Grandfather CAD CABG   SH: Quit smoking 2011. Heavy ETOH until 1/15.  Engaged.  Works as Freight forwarder at [employer].   ROS: All systems negative except as listed in HPI, PMH and Problem List.  Current Outpatient Prescriptions  Medication Sig Dispense Refill  . aspirin EC 81 MG tablet Take 1 tablet (81 mg total) by mouth daily. 90 tablet 3  . carvedilol (COREG) 12.5 MG tablet TAKE 1 TABLET BY MOUTH TWICE DAILY 60 tablet 3  . losartan (COZAAR) 50 MG tablet TAKE 1 AND 1/2 TABLETS BY MOUTH TWICE DAILY 90 tablet 11  . pantoprazole (PROTONIX) 40 MG tablet Take 1 tablet (40 mg total) by mouth daily. 30 tablet 1  . spironolactone (ALDACTONE) 25 MG tablet Take 1 tablet (25 mg total) by mouth daily. 90 tablet 3  . warfarin (COUMADIN) 5 MG tablet HOLD UNTIL FURTHER DIRECTIONS FROM DR [facility] OR COUMADIN CLINIC (Patient taking differently: Take 5-7.5 mg by mouth daily at 6 PM. 7.5mg  on Sun Tue Wed Thur Sat 5mg  Mon Fri) 45 tablet 2  . docusate sodium (COLACE) 100 MG capsule Take 1 capsule (100 mg total) by mouth 2 (two) times daily. 10 capsule 0  . hydrocortisone (ANUSOL-HC) 2.5 % rectal cream Place 1 application rectally 2 (two) times daily. 30 g 0   No current facility-administered medications for this encounter.      PHYSICAL EXAM: Vitals:   11/07/16 1050  BP: 116/66  Pulse: 68  SpO2: 98%  Weight: 166 lb 4 oz (75.4 kg)   General:  Well appearing. No resp difficulty. HEENT: normal Neck: supple. JVP not elevated. Carotids 2+ bilaterally; no bruits. No lymphadenopathy or thryomegaly appreciated. Cor: PMI normal. Regular rate & rhythm. Mechanical S2.  No rubs, gallops. 1/6 SEM RUSB. Lungs: clear to auscultation bilaterally.  Abdomen: soft, nontender,  nondistended. No hepatosplenomegaly. No bruits or masses. Good bowel sounds. Extremities: no cyanosis, clubbing, rash, edema Neuro: alert & orientedx3, cranial nerves grossly intact. Moves all 4 extremities w/o difficulty. Affect pleasant.  ASSESSMENT & PLAN: 1. Chronic systolic CHF: Nonischemic cardiomyopathy.  Tachy-mediated CMP versus ETOH CMP versus long-standing aortic insufficiency or (most likely) a combination of all 3 possible causes.  He is no longer drinking and has had mechanical AVR.  Last echo in 9/17 showed improvement in EF to 55-60%.      - Continue Coreg to 12.5 mg bid.  - Continue losartan to 75 mg bid. Intolerant ace due to cough.  - Continue spironolactone 25 mg daily.   - BMET today (should get at least every 3 months).  2. Paroxysmal atrial fibrillation/flutter: s/p TEE/DCCV 08/01/13. Had Maze/LAA obliteration with Bentall.  Post-op atypical flutter.  Remaining in SR. No longer on amiodarone.   3. Bicuspid aortic valve disorder with severe AI and severely dilated ascending aorta (5.1 cm on CTA).  Status post mechanical AVR + ascending aorta replacement.  The  valve looked good on 7/15 echo. Episode of possible amaurosis fugax about a year ago.  He is now on ASA 81 mg daily (which will be continued) and on warfarin with goal INR 2.5-3.5. INR managed [facility].  - Reinforced need for SBE prophylaxis - CBC today.  4. ETOH abuse: Occasional ETOH, not heavy as in the past.   5. HTN: Stable. Continue current regimen. 6. Lower GI bleeding: Thought to be hemorrhoidal.  He must continue warfarin.  He is not particularly compliant with treatment regimen for hemorrhoids. I asked him to make sure to take the Anusol and stool softeners as suggested by GI.  If he continues to have bleeding, will need to consider banding.  - He can stop ASA if bleeding continues with proper treatment of hemorrhoids.  - Needs followup with GI.  - CBC today.  - Check iron studies, given feraheme if low.    Followup 3 months with me.   Martika Egler French Ana 11/08/2016

## 2016-11-13 ENCOUNTER — Telehealth (HOSPITAL_COMMUNITY): Payer: Self-pay

## 2016-11-13 NOTE — Telephone Encounter (Signed)
Iron Binding Cap (TIBC)  Order: [id number]  Status:  Final result Visible to patient:  Yes (MyChart) Dx:  Lower GI bleed; Chronic systolic CHF ...  Notes recorded by Effie Berkshire, RN on 11/13/2016 at 9:44 AM EDT Patient left VM on triage returning call. Attempted to return call to patient, no answer. ------  Notes recorded by Scarlette Calico, RN on 11/07/2016 at 5:00 PM EDT Left message to call back ------  Notes recorded by [name], MD on 11/07/2016 at 2:46 PM EDT Hemoglobin low again. He needs Feraheme infusion again, please arrange. He needs repeat CBC next week. Must use Anusol and stool softener as ordered by GI. Ask him to followup with GI.

## 2016-11-14 NOTE — Telephone Encounter (Signed)
Pt returned call to set up infusions. I informed him our nurses were in a conference and someone would contact him.    Message routed to [facility] and [place]

## 2016-11-18 ENCOUNTER — Other Ambulatory Visit (HOSPITAL_COMMUNITY): Payer: Self-pay | Admitting: *Deleted

## 2016-11-18 ENCOUNTER — Other Ambulatory Visit (HOSPITAL_COMMUNITY): Payer: Self-pay

## 2016-11-18 DIAGNOSIS — D508 Other iron deficiency anemias: Secondary | ICD-10-CM

## 2016-11-18 MED ORDER — SODIUM CHLORIDE 0.9 % IV SOLN: 510.0000 mg | Freq: Once | INTRAVENOUS | Status: DC

## 2016-11-18 NOTE — Telephone Encounter (Signed)
Patient called back today regarding Feraheme infusion.  Patient has been scheduled for infusion tomorrow at 9 AM in short stay and orders have been placed.  Patient is aware and no further questions at this time.

## 2016-11-19 ENCOUNTER — Ambulatory Visit (HOSPITAL_COMMUNITY)
Admission: RE | Admit: 2016-11-19 | Discharge: 2016-11-19 | Disposition: A | Discharge status: Home / Self Care | Payer: 59 | Source: Ambulatory Visit | Attending: Cardiology | Admitting: Cardiology

## 2016-11-19 DIAGNOSIS — D509 Iron deficiency anemia, unspecified: Secondary | ICD-10-CM | POA: Insufficient documentation

## 2016-11-19 MED ORDER — SODIUM CHLORIDE 0.9 % IV SOLN
510.0000 mg | Freq: Once | INTRAVENOUS | Status: AC
  Administered 2016-11-19: 510 mg via INTRAVENOUS
  Filled 2016-11-19: qty 17

## 2016-11-19 NOTE — Discharge Instructions (Signed)

## 2016-11-26 ENCOUNTER — Other Ambulatory Visit: Payer: Self-pay | Admitting: Internal Medicine

## 2016-12-19 NOTE — Addendum Note (Signed)
Addendum  created 12/19/16 1254 by Rica Koyanagi, MD   Sign clinical note

## 2017-01-02 ENCOUNTER — Other Ambulatory Visit: Payer: Self-pay | Admitting: Cardiology

## 2017-01-02 ENCOUNTER — Ambulatory Visit (INDEPENDENT_AMBULATORY_CARE_PROVIDER_SITE_OTHER): Payer: 59 | Admitting: *Deleted

## 2017-01-02 DIAGNOSIS — I351 Nonrheumatic aortic (valve) insufficiency: Secondary | ICD-10-CM

## 2017-01-02 DIAGNOSIS — I359 Nonrheumatic aortic valve disorder, unspecified: Secondary | ICD-10-CM | POA: Diagnosis not present

## 2017-01-02 DIAGNOSIS — Z9889 Other specified postprocedural states: Secondary | ICD-10-CM

## 2017-01-02 DIAGNOSIS — Z8679 Personal history of other diseases of the circulatory system: Secondary | ICD-10-CM | POA: Diagnosis not present

## 2017-01-02 LAB — POCT INR: INR: 3.2

## 2017-01-02 MED ORDER — WARFARIN SODIUM 5 MG PO TABS: 5.0000 mg | ORAL_TABLET | ORAL | 1 refills | Status: DC

## 2017-02-06 ENCOUNTER — Encounter (HOSPITAL_COMMUNITY): Payer: Self-pay

## 2017-02-06 ENCOUNTER — Ambulatory Visit (HOSPITAL_COMMUNITY)
Admission: RE | Admit: 2017-02-06 | Discharge: 2017-02-06 | Disposition: A | Discharge status: Home / Self Care | Payer: 59 | Source: Ambulatory Visit | Attending: Cardiology | Admitting: Cardiology

## 2017-02-06 ENCOUNTER — Ambulatory Visit (INDEPENDENT_AMBULATORY_CARE_PROVIDER_SITE_OTHER): Payer: 59 | Admitting: *Deleted

## 2017-02-06 VITALS — BP 153/88 | HR 67 | Wt 165.4 lb

## 2017-02-06 DIAGNOSIS — K922 Gastrointestinal hemorrhage, unspecified: Secondary | ICD-10-CM

## 2017-02-06 DIAGNOSIS — Z952 Presence of prosthetic heart valve: Secondary | ICD-10-CM | POA: Insufficient documentation

## 2017-02-06 DIAGNOSIS — I5022 Chronic systolic (congestive) heart failure: Secondary | ICD-10-CM | POA: Diagnosis present

## 2017-02-06 DIAGNOSIS — Z9889 Other specified postprocedural states: Secondary | ICD-10-CM

## 2017-02-06 DIAGNOSIS — Z87891 Personal history of nicotine dependence: Secondary | ICD-10-CM | POA: Diagnosis not present

## 2017-02-06 DIAGNOSIS — Z7901 Long term (current) use of anticoagulants: Secondary | ICD-10-CM | POA: Diagnosis not present

## 2017-02-06 DIAGNOSIS — I359 Nonrheumatic aortic valve disorder, unspecified: Secondary | ICD-10-CM | POA: Diagnosis not present

## 2017-02-06 DIAGNOSIS — Z79899 Other long term (current) drug therapy: Secondary | ICD-10-CM | POA: Insufficient documentation

## 2017-02-06 DIAGNOSIS — Z7982 Long term (current) use of aspirin: Secondary | ICD-10-CM | POA: Diagnosis not present

## 2017-02-06 DIAGNOSIS — I428 Other cardiomyopathies: Secondary | ICD-10-CM | POA: Insufficient documentation

## 2017-02-06 DIAGNOSIS — F1010 Alcohol abuse, uncomplicated: Secondary | ICD-10-CM | POA: Insufficient documentation

## 2017-02-06 DIAGNOSIS — Z95828 Presence of other vascular implants and grafts: Secondary | ICD-10-CM | POA: Diagnosis not present

## 2017-02-06 DIAGNOSIS — I351 Nonrheumatic aortic (valve) insufficiency: Secondary | ICD-10-CM | POA: Diagnosis not present

## 2017-02-06 DIAGNOSIS — I110 Hypertensive heart disease with heart failure: Secondary | ICD-10-CM | POA: Diagnosis not present

## 2017-02-06 DIAGNOSIS — I480 Paroxysmal atrial fibrillation: Secondary | ICD-10-CM

## 2017-02-06 DIAGNOSIS — Z8679 Personal history of other diseases of the circulatory system: Secondary | ICD-10-CM | POA: Diagnosis not present

## 2017-02-06 DIAGNOSIS — Z8249 Family history of ischemic heart disease and other diseases of the circulatory system: Secondary | ICD-10-CM | POA: Diagnosis not present

## 2017-02-06 LAB — CBC
HCT: 31.6 % — ABNORMAL LOW (ref 39.0–52.0)
Hemoglobin: 9.8 g/dL — ABNORMAL LOW (ref 13.0–17.0)
MCH: 24.7 pg — ABNORMAL LOW (ref 26.0–34.0)
MCHC: 31 g/dL (ref 30.0–36.0)
MCV: 79.6 fL (ref 78.0–100.0)
Platelets: 238 10*3/uL (ref 150–400)
RBC: 3.97 MIL/uL — ABNORMAL LOW (ref 4.22–5.81)
RDW: 18.7 % — ABNORMAL HIGH (ref 11.5–15.5)
WBC: 4.8 10*3/uL (ref 4.0–10.5)

## 2017-02-06 LAB — BASIC METABOLIC PANEL
Anion gap: 6 (ref 5–15)
BUN: 11 mg/dL (ref 6–20)
CO2: 26 mmol/L (ref 22–32)
Calcium: 9 mg/dL (ref 8.9–10.3)
Chloride: 109 mmol/L (ref 101–111)
Creatinine, Ser: 0.86 mg/dL (ref 0.61–1.24)
GFR calc Af Amer: >60 mL/min (ref >60)
GFR calc non Af Amer: >60 mL/min (ref >60)
Glucose, Bld: 100 mg/dL — ABNORMAL HIGH (ref 65–99)
Potassium: 4.2 mmol/L (ref 3.5–5.1)
Sodium: 141 mmol/L (ref 135–145)

## 2017-02-06 LAB — POCT INR: INR: 2.4

## 2017-02-06 NOTE — Patient Instructions (Signed)
Routine lab work today. Will notify you of abnormal results  Follow up and Echo with Dr.McLean in 3 months

## 2017-02-06 NOTE — [facility] Notes (Signed)
Patient ID: [name] Hinton, male   DOB: Jun 10, 1971, 46 y.o.   MRN: [id number] PCP: Dr. Ashby Dawes Cardiology: Dr. Aundra Dubin GI: Dr Paulita Fujita  [name] Hinton is a 46 year old with a history of alcohol abuse and former smoker (quit  2011). Hospitalized 1/15 with increased dyspnea-->ECHO performed and showed EF 25%, global hypokinesis, RV mildly dilated,severe aortic insufficiency.  Patient had new-onset acute systolic CHF and atrial flutter. Started on cardizem drip and Xarelto. Initially in A flutter--> became A fib.   He had TEE and DC-CV on 08/01/13.  Dr Roxy Manns provided consultation due to ascending aortic aneurysm and bicuspid valve with severe AI. He was discharged on ACEI, beta blocker, spironolactone, and xarelto. Discharge weight 150 pounds.    Had cough with lisinopril and switched to losartan.  LHC/RHC was done 2/15 showing no coronary disease and elevated PCWP with pulmonary hypertension.   In 3/15, he had replacement of ascending aortic and aortic valve with Bentall procedure with mechanical aortic valve.  He also had Maze procedure and LAA obliteration.  He was noted to have gone into atypical atrial flutter by the time of his 3/30 followup with Dr. Roxy Manns.  He was started on amiodarone and went back into NSR. He is back at work.  Echo in 4/15 showed normally functioning mechanical aortic valve, but EF remained 30-35%.   Echo in 4/15 showed normally functioning mechanical aortic valve, but EF remained 30-35%.  ECHO 01/2014  EF up to 45% with normal mechanical aortic valve.  Echo 9/17 with increase in EF to 55-60% with mean gradient 13 mmHg across mechanical AoV.    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  No other neurological symptoms.   In 3/18, he was admitted with lower GI bleeding/symptomatic anemia.  He received 2 units PRBCs.  Suspected to be hemorrhoidal.  He had a colonoscopy confirming hemorrhoids and showing 1 polyp that was removed.  He was told to start a stool softener  and Anusol but has not been taking regularly. Still has occasional episodes of BRBPR.  No exertional dyspnea but feels a bit more fatigued generally than his baseline. No lightheadedness.   He returns for HF follow up. Feeling ok. Started having rectal bleeding again. Trying to get follow up with GI. Denies SOB/PND/Orthopnea. Did not take meds this morning because he was running late fr his appointment. Rarely drinking alcohol.  Working full time.   Labs (1/15): K 5.2, creatinine 1.3, AST 40, ALT 58 Labs (2/15): K 4.7, creatinine 1.1 Labs (4/15): K 4.9, creatinine 1.0, LFTs normal, TSH normal Labs (5/15): K 4.6, creatinine 1.1, BNP 134 Labs (6/15): K 4.8  creatinine 1.0. INR 2.4 Labs (03/01/14): K 4.7 Creatinine 1.16  Labs (11/15): K 4.8, creatinine 0.79 Labs (3/18): K 4.4, creatinine 0.8, hgb 10.2  PMH:  1. A fib/Aflutter: Initially flutter during 1/15 admission, degenerated to fibrillation --> S/P TEE DC-CV to NSR. Atypical atrial flutter recurred post-op in 3/15. 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  2. 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  TEE (1/15) with EF 25%, moderate LV dilation, bicuspid aortic valve with severe eccentric AI and no AS, ascending aorta 5.0 cm, moderately decreased RV systolic function.  CTA chest (1/15) with 5.1 cm ascending aortic aneurysm. Patient had Bentall replacement of ascending aorta with mechanical aortic valve and Maze with LAA obliteration in 3/15.  3. Cardiomyopathy with systolic CHF: As above, TEE with EF 25% and moderately  dilated LV, moderately decreased RV systolic function.  Tachy-mediated CMP versus ETOH CMP versus long-standing AI versus a combination of the three.  LHC/RHC (2/15): no coronary disease, mean RA 5, PA 62/25, mean PCWP 22, CI 1.8, PVR 5.2 WU, LVEDP 35 (severe AI).  Echo (2/15): EF 35-40%, moderately dilated LV, bicuspid aortic valve with severe AI.  Echo (4/15) with mildly  dilated LV, mild LVH, EF 30-35%, normal RV size and systolic function, mechanical aortic valve appeared to function normally, IVC small.  Echo (7/15) with EF 45%, normal mechanical aortic valve, normal RV size with mildly decreased systolic function.  - Echo (9/17) with EF 55-60%, mechanical aortic valve with mean gradient 13 mmHg.  4. Former smoker quit 2011 5. Alcohol abuse but has cut back considerably.  6. ACEI cough 7. Palpitations: Holter monitor (12/15) with occasional PVCs and PACs.   FH: Father- Heart Failure, Grandfather CAD CABG   SH: Quit smoking 2011. Heavy ETOH until 1/15.  Engaged.  Works as Freight forwarder at [employer].   ROS: All systems negative except as listed in HPI, PMH and Problem List.  Current Outpatient Prescriptions  Medication Sig Dispense Refill  . aspirin EC 81 MG tablet Take 1 tablet (81 mg total) by mouth daily. 90 tablet 3  . carvedilol (COREG) 12.5 MG tablet TAKE 1 TABLET BY MOUTH TWICE DAILY 60 tablet 3  . docusate sodium (COLACE) 100 MG capsule Take 1 capsule (100 mg total) by mouth 2 (two) times daily. 10 capsule 0  . hydrocortisone (ANUSOL-HC) 2.5 % rectal cream Place 1 application rectally 2 (two) times daily. 30 g 0  . losartan (COZAAR) 50 MG tablet TAKE 1 AND 1/2 TABLETS BY MOUTH TWICE DAILY 90 tablet 11  . pantoprazole (PROTONIX) 40 MG tablet Take 1 tablet (40 mg total) by mouth daily. 30 tablet 1  . spironolactone (ALDACTONE) 25 MG tablet Take 1 tablet (25 mg total) by mouth daily. 90 tablet 3  . warfarin (COUMADIN) 5 MG tablet Take 1 tablet (5 mg total) by mouth as directed. 45 tablet 1   No current facility-administered medications for this encounter.      PHYSICAL EXAM: Vitals:   02/06/17 1037  BP: (!) 153/88  Pulse: 67  SpO2: 100%  Weight: 165 lb 6 oz (75 kg)   Filed Weights   02/06/17 1037  Weight: 165 lb 6 oz (75 kg)   General:  Well appearing. No resp difficulty. HEENT: normal Neck: supple. JVP not elevated. Carotids 2+ bilaterally; no  bruits. No lymphadenopathy or thryomegaly appreciated. Cor: PMI normal. Regular rate & rhythm. Mechanical S2.  No rubs, gallops. 1/6 SEM RUSB. Lungs: clear to auscultation bilaterally.  Abdomen: soft, nontender, nondistended. No hepatosplenomegaly. No bruits or masses. Good bowel sounds. Extremities: no cyanosis, clubbing, rash, edema Neuro: alert & orientedx3, cranial nerves grossly intact. Moves all 4 extremities w/o difficulty. Affect pleasant.  ASSESSMENT & PLAN: 1. Chronic systolic CHF: Nonischemic cardiomyopathy.  Tachy-mediated CMP versus ETOH CMP versus long-standing aortic insufficiency or (most likely) a combination of all 3 possible causes.  He is no longer drinking and has had mechanical AVR.  Last echo in 9/17 showed improvement in EF to 55-60%.    Repeat ECHO next visit.  Volume status stable. Continue current regimen. SBP at home in the 120s. I suspect SBP elevated because he didn't take his medications.  - Continue Coreg to 12.5 mg bid.  - Continue losartan to 75 mg bid. Intolerant ace due to cough.  - Continue spironolactone 25  mg daily.   2. Paroxysmal atrial fibrillation/flutter: s/p TEE/DCCV 08/01/13. Had Maze/LAA obliteration with Bentall.  Post-op atypical flutter.  Regular pulse.    3. Bicuspid aortic valve disorder with severe AI and severely dilated ascending aorta (5.1 cm on CTA).  Status post mechanical AVR + ascending aorta replacement.  The valve looked good on 7/15 echo. Episode of possible amaurosis fugax about a year ago.  He is now on ASA 81 mg daily (which will be continued) and on warfarin with goal INR 2.5-3.5. INR managed [facility]. - Reinforced need for SBE prophylaxis  4. ETOH abuse: Occasional ETOH, not heavy as in the past.  Reinfoced to limit alcohol  5. HTN: elevated but has not had meds this morning. Continue current regimen 6. . Lower GI bleeding: Thought to be hemorrhoidal.  He must continue warfarin.   Recently with some bleeding. He has  contacted GI for follow up. CBC today.   Follow up in 3 months with Dr Aundra Dubin. CBC and BMET today. ECHO next visit.       Amy Clegg NP-C  02/06/2017

## 2017-02-17 ENCOUNTER — Telehealth (HOSPITAL_COMMUNITY): Payer: Self-pay | Admitting: Cardiology

## 2017-02-17 NOTE — Telephone Encounter (Signed)
I am not sure there is much we can do to expedite this. Please have him ask his PCP.

## 2017-02-17 NOTE — Telephone Encounter (Signed)
Patient called triage line with a a request to expedite GI surgeon referral. Patient reports he is still having bleeding from hemorrhoids and is concerns his Hg will get low in the meantime, (9.8 on 02/07/16/8)   appt for surgeon is late August   Advised normally cardiology would not be able to expedite, can follow up with PCP regarding management of Hg in the meantime, however I would be more than happy to run by providers for further evaluation if needed.

## 2017-02-18 NOTE — Telephone Encounter (Signed)
Detailed message left with instructions.

## 2017-03-06 ENCOUNTER — Ambulatory Visit (INDEPENDENT_AMBULATORY_CARE_PROVIDER_SITE_OTHER): Payer: 59 | Admitting: *Deleted

## 2017-03-06 DIAGNOSIS — I359 Nonrheumatic aortic valve disorder, unspecified: Secondary | ICD-10-CM

## 2017-03-06 DIAGNOSIS — Z8679 Personal history of other diseases of the circulatory system: Secondary | ICD-10-CM | POA: Diagnosis not present

## 2017-03-06 DIAGNOSIS — Z9889 Other specified postprocedural states: Secondary | ICD-10-CM

## 2017-03-06 DIAGNOSIS — I351 Nonrheumatic aortic (valve) insufficiency: Secondary | ICD-10-CM | POA: Diagnosis not present

## 2017-03-06 LAB — POCT INR: INR: 4.3

## 2017-03-10 ENCOUNTER — Other Ambulatory Visit: Payer: Self-pay | Admitting: Cardiology

## 2017-03-20 ENCOUNTER — Ambulatory Visit (INDEPENDENT_AMBULATORY_CARE_PROVIDER_SITE_OTHER): Payer: 59 | Admitting: *Deleted

## 2017-03-20 DIAGNOSIS — I359 Nonrheumatic aortic valve disorder, unspecified: Secondary | ICD-10-CM | POA: Diagnosis not present

## 2017-03-20 DIAGNOSIS — Z8679 Personal history of other diseases of the circulatory system: Secondary | ICD-10-CM

## 2017-03-20 DIAGNOSIS — I351 Nonrheumatic aortic (valve) insufficiency: Secondary | ICD-10-CM | POA: Diagnosis not present

## 2017-03-20 DIAGNOSIS — Z9889 Other specified postprocedural states: Secondary | ICD-10-CM | POA: Diagnosis not present

## 2017-03-20 LAB — POCT INR: INR: 3.2

## 2017-04-15 ENCOUNTER — Telehealth (HOSPITAL_COMMUNITY): Payer: Self-pay | Admitting: Cardiology

## 2017-04-15 NOTE — Telephone Encounter (Signed)
Pt needs f/u appt with Dr. Aundra Dubin for October, left message for patient to call our office to schedule.

## 2017-04-17 ENCOUNTER — Ambulatory Visit (INDEPENDENT_AMBULATORY_CARE_PROVIDER_SITE_OTHER): Payer: 59

## 2017-04-17 DIAGNOSIS — Z8679 Personal history of other diseases of the circulatory system: Secondary | ICD-10-CM

## 2017-04-17 DIAGNOSIS — I359 Nonrheumatic aortic valve disorder, unspecified: Secondary | ICD-10-CM

## 2017-04-17 DIAGNOSIS — I351 Nonrheumatic aortic (valve) insufficiency: Secondary | ICD-10-CM

## 2017-04-17 DIAGNOSIS — Z9889 Other specified postprocedural states: Secondary | ICD-10-CM | POA: Diagnosis not present

## 2017-04-17 LAB — POCT INR: INR: 2.9

## 2017-04-29 ENCOUNTER — Encounter (HOSPITAL_COMMUNITY): Payer: Self-pay | Admitting: Cardiology

## 2017-04-29 NOTE — Telephone Encounter (Signed)
Patient has not returned call to schedule appt.  Letter mailed to patient asking him to contact our office.

## 2017-05-05 ENCOUNTER — Other Ambulatory Visit (HOSPITAL_COMMUNITY): Payer: Self-pay | Admitting: *Deleted

## 2017-05-05 MED ORDER — WARFARIN SODIUM 5 MG PO TABS: ORAL_TABLET | ORAL | 3 refills | Status: DC

## 2017-05-15 ENCOUNTER — Ambulatory Visit (INDEPENDENT_AMBULATORY_CARE_PROVIDER_SITE_OTHER): Payer: 59 | Admitting: *Deleted

## 2017-05-15 DIAGNOSIS — Z5181 Encounter for therapeutic drug level monitoring: Secondary | ICD-10-CM

## 2017-05-15 DIAGNOSIS — Z8679 Personal history of other diseases of the circulatory system: Secondary | ICD-10-CM | POA: Diagnosis not present

## 2017-05-15 DIAGNOSIS — Z9889 Other specified postprocedural states: Secondary | ICD-10-CM | POA: Diagnosis not present

## 2017-05-15 DIAGNOSIS — I351 Nonrheumatic aortic (valve) insufficiency: Secondary | ICD-10-CM | POA: Diagnosis not present

## 2017-05-15 DIAGNOSIS — I359 Nonrheumatic aortic valve disorder, unspecified: Secondary | ICD-10-CM

## 2017-05-15 LAB — POCT INR: INR: 4.1

## 2017-06-05 ENCOUNTER — Ambulatory Visit (INDEPENDENT_AMBULATORY_CARE_PROVIDER_SITE_OTHER): Payer: 59 | Admitting: Pharmacist

## 2017-06-05 DIAGNOSIS — I359 Nonrheumatic aortic valve disorder, unspecified: Secondary | ICD-10-CM | POA: Diagnosis not present

## 2017-06-05 DIAGNOSIS — Z9889 Other specified postprocedural states: Secondary | ICD-10-CM | POA: Diagnosis not present

## 2017-06-05 DIAGNOSIS — I351 Nonrheumatic aortic (valve) insufficiency: Secondary | ICD-10-CM

## 2017-06-05 DIAGNOSIS — Z8679 Personal history of other diseases of the circulatory system: Secondary | ICD-10-CM

## 2017-06-05 LAB — POCT INR: INR: 5.8

## 2017-06-05 NOTE — Patient Instructions (Signed)
Skip your Coumadin today and tomorrow, then start taking 1.5 tablets every day except for 1 on Mondays, Wednesday and Fridays. Recheck INR in 2 weeks. [facility] [phone].

## 2017-06-19 ENCOUNTER — Encounter: Payer: Self-pay | Admitting: *Deleted

## 2017-06-19 ENCOUNTER — Ambulatory Visit (INDEPENDENT_AMBULATORY_CARE_PROVIDER_SITE_OTHER): Payer: 59 | Admitting: *Deleted

## 2017-06-19 DIAGNOSIS — Z9889 Other specified postprocedural states: Secondary | ICD-10-CM | POA: Diagnosis not present

## 2017-06-19 DIAGNOSIS — Z5181 Encounter for therapeutic drug level monitoring: Secondary | ICD-10-CM | POA: Diagnosis not present

## 2017-06-19 DIAGNOSIS — I359 Nonrheumatic aortic valve disorder, unspecified: Secondary | ICD-10-CM | POA: Diagnosis not present

## 2017-06-19 DIAGNOSIS — I351 Nonrheumatic aortic (valve) insufficiency: Secondary | ICD-10-CM

## 2017-06-19 DIAGNOSIS — Z8679 Personal history of other diseases of the circulatory system: Secondary | ICD-10-CM | POA: Diagnosis not present

## 2017-06-19 LAB — POCT INR: INR: 2.5

## 2017-06-19 NOTE — Patient Instructions (Signed)
Continue  taking 1.5 tablets every day except for 1 on Mondays, Wednesday and Fridays. Recheck INR in 2 weeks. [facility] [phone].

## 2017-08-31 ENCOUNTER — Other Ambulatory Visit: Payer: Self-pay | Admitting: Internal Medicine

## 2017-10-16 ENCOUNTER — Other Ambulatory Visit: Payer: Self-pay | Admitting: *Deleted

## 2017-10-18 ENCOUNTER — Other Ambulatory Visit (HOSPITAL_COMMUNITY): Payer: Self-pay | Admitting: Internal Medicine

## 2017-10-18 ENCOUNTER — Other Ambulatory Visit (HOSPITAL_COMMUNITY): Payer: Self-pay | Admitting: Cardiology

## 2017-10-21 ENCOUNTER — Ambulatory Visit (INDEPENDENT_AMBULATORY_CARE_PROVIDER_SITE_OTHER): Payer: Self-pay | Admitting: *Deleted

## 2017-10-21 DIAGNOSIS — I359 Nonrheumatic aortic valve disorder, unspecified: Secondary | ICD-10-CM

## 2017-10-21 DIAGNOSIS — Z8679 Personal history of other diseases of the circulatory system: Secondary | ICD-10-CM

## 2017-10-21 DIAGNOSIS — Z5181 Encounter for therapeutic drug level monitoring: Secondary | ICD-10-CM

## 2017-10-21 DIAGNOSIS — I480 Paroxysmal atrial fibrillation: Secondary | ICD-10-CM

## 2017-10-21 DIAGNOSIS — I351 Nonrheumatic aortic (valve) insufficiency: Secondary | ICD-10-CM

## 2017-10-21 DIAGNOSIS — Z954 Presence of other heart-valve replacement: Secondary | ICD-10-CM

## 2017-10-21 DIAGNOSIS — Z9889 Other specified postprocedural states: Secondary | ICD-10-CM

## 2017-10-21 LAB — POCT INR: INR: 2.2

## 2017-10-21 MED ORDER — WARFARIN SODIUM 5 MG PO TABS: ORAL_TABLET | ORAL | 0 refills | Status: DC

## 2017-10-21 NOTE — Patient Instructions (Signed)
Description   Today April 3rd take 1 and 1/2 tablets then continue  taking 1and 1/2 tablets every day except for 1 on Mondays, Wednesday and Fridays. Recheck INR in 2 weeks. [facility] [phone].

## 2017-10-23 ENCOUNTER — Telehealth (HOSPITAL_COMMUNITY): Payer: Self-pay | Admitting: Cardiology

## 2017-10-23 ENCOUNTER — Other Ambulatory Visit (HOSPITAL_COMMUNITY): Payer: Self-pay | Admitting: *Deleted

## 2017-10-23 MED ORDER — CARVEDILOL 12.5 MG PO TABS: 12.5000 mg | ORAL_TABLET | Freq: Two times a day (BID) | ORAL | 0 refills | Status: DC

## 2017-10-23 NOTE — Telephone Encounter (Signed)
Called patient and left message to call back.  Per staff message from Georgeanna Lea, RN pt needs f/u appt for refills.

## 2017-10-30 NOTE — Telephone Encounter (Signed)
Patient has not called back to schedule appt.  Left 2nd VM for patient to call back to schedule f/u appt for refills.

## 2017-11-10 ENCOUNTER — Encounter (HOSPITAL_COMMUNITY): Payer: Self-pay | Admitting: Cardiology

## 2017-11-26 ENCOUNTER — Other Ambulatory Visit: Payer: Self-pay | Admitting: *Deleted

## 2017-12-03 ENCOUNTER — Ambulatory Visit (INDEPENDENT_AMBULATORY_CARE_PROVIDER_SITE_OTHER): Payer: Self-pay | Admitting: *Deleted

## 2017-12-03 DIAGNOSIS — I359 Nonrheumatic aortic valve disorder, unspecified: Secondary | ICD-10-CM

## 2017-12-03 DIAGNOSIS — I351 Nonrheumatic aortic (valve) insufficiency: Secondary | ICD-10-CM

## 2017-12-03 DIAGNOSIS — Z954 Presence of other heart-valve replacement: Secondary | ICD-10-CM

## 2017-12-03 DIAGNOSIS — Z5181 Encounter for therapeutic drug level monitoring: Secondary | ICD-10-CM

## 2017-12-03 DIAGNOSIS — I480 Paroxysmal atrial fibrillation: Secondary | ICD-10-CM

## 2017-12-03 DIAGNOSIS — Z9889 Other specified postprocedural states: Secondary | ICD-10-CM

## 2017-12-03 DIAGNOSIS — Z8679 Personal history of other diseases of the circulatory system: Secondary | ICD-10-CM

## 2017-12-03 LAB — POCT INR: INR: 4.4

## 2017-12-03 MED ORDER — WARFARIN SODIUM 5 MG PO TABS: ORAL_TABLET | ORAL | 0 refills | Status: DC

## 2017-12-03 NOTE — Patient Instructions (Signed)
Description   Pt has already take 5mg  today May 16th so instructed not to take remaining 1/2 tablet today and then Hold coumadin tomorrow May 17th then continue on same dose 1and 1/2 tablets every day except for 1 on Mondays, Wednesday and Fridays. Recheck INR in 2 weeks. [facility] [phone].

## 2017-12-21 ENCOUNTER — Ambulatory Visit (INDEPENDENT_AMBULATORY_CARE_PROVIDER_SITE_OTHER): Payer: Self-pay | Admitting: *Deleted

## 2017-12-21 DIAGNOSIS — Z9889 Other specified postprocedural states: Secondary | ICD-10-CM

## 2017-12-21 DIAGNOSIS — Z8679 Personal history of other diseases of the circulatory system: Secondary | ICD-10-CM

## 2017-12-21 DIAGNOSIS — I480 Paroxysmal atrial fibrillation: Secondary | ICD-10-CM

## 2017-12-21 DIAGNOSIS — I351 Nonrheumatic aortic (valve) insufficiency: Secondary | ICD-10-CM

## 2017-12-21 DIAGNOSIS — I359 Nonrheumatic aortic valve disorder, unspecified: Secondary | ICD-10-CM

## 2017-12-21 DIAGNOSIS — Z954 Presence of other heart-valve replacement: Secondary | ICD-10-CM

## 2017-12-21 DIAGNOSIS — Z5181 Encounter for therapeutic drug level monitoring: Secondary | ICD-10-CM

## 2017-12-21 LAB — POCT INR: INR: 5.7 — AB (ref 2.0–3.0)

## 2017-12-21 NOTE — Patient Instructions (Signed)
Description   PT has taken coumadin today June 3rd instructed to hold coumadin on June 4th and no coumadin on June 5th then change coumadin dose to 1 tablet daily except 1 and  1/2 tablets on Sundays Tuesdays and Thursdays  Recheck INR in 1 week. [facility] [phone]. Monitor for any bleeding and go to ER if seen Do good serving of greens today then do 2 servings of dark leafy greens a week

## 2018-01-08 ENCOUNTER — Other Ambulatory Visit: Payer: Self-pay | Admitting: *Deleted

## 2018-01-08 MED ORDER — WARFARIN SODIUM 5 MG PO TABS: ORAL_TABLET | ORAL | 0 refills | Status: DC

## 2018-01-12 ENCOUNTER — Ambulatory Visit (INDEPENDENT_AMBULATORY_CARE_PROVIDER_SITE_OTHER): Payer: Self-pay | Admitting: *Deleted

## 2018-01-12 DIAGNOSIS — I359 Nonrheumatic aortic valve disorder, unspecified: Secondary | ICD-10-CM

## 2018-01-12 DIAGNOSIS — Z9889 Other specified postprocedural states: Secondary | ICD-10-CM

## 2018-01-12 DIAGNOSIS — Z8679 Personal history of other diseases of the circulatory system: Secondary | ICD-10-CM

## 2018-01-12 DIAGNOSIS — Z954 Presence of other heart-valve replacement: Secondary | ICD-10-CM

## 2018-01-12 DIAGNOSIS — Z5181 Encounter for therapeutic drug level monitoring: Secondary | ICD-10-CM

## 2018-01-12 DIAGNOSIS — I351 Nonrheumatic aortic (valve) insufficiency: Secondary | ICD-10-CM

## 2018-01-12 DIAGNOSIS — I480 Paroxysmal atrial fibrillation: Secondary | ICD-10-CM

## 2018-01-12 LAB — POCT INR: INR: 4.9 — AB (ref 2.0–3.0)

## 2018-01-12 MED ORDER — WARFARIN SODIUM 5 MG PO TABS: ORAL_TABLET | ORAL | 1 refills | Status: DC

## 2018-01-12 NOTE — Patient Instructions (Signed)
Description   Skip today's dose, then change your dose to 5mg  daily except 7.5mg  on Mondays and Fridays. Recheck INR in 10 days.  [facility] [phone]. Monitor for any bleeding and go to ER if seen Do good serving of greens today then do 2 servings of dark leafy greens a week

## 2018-01-22 ENCOUNTER — Other Ambulatory Visit (HOSPITAL_COMMUNITY): Payer: Self-pay | Admitting: Internal Medicine

## 2018-01-25 ENCOUNTER — Ambulatory Visit (INDEPENDENT_AMBULATORY_CARE_PROVIDER_SITE_OTHER): Payer: Self-pay | Admitting: *Deleted

## 2018-01-25 DIAGNOSIS — Z954 Presence of other heart-valve replacement: Secondary | ICD-10-CM

## 2018-01-25 DIAGNOSIS — Z5181 Encounter for therapeutic drug level monitoring: Secondary | ICD-10-CM

## 2018-01-25 DIAGNOSIS — Z9889 Other specified postprocedural states: Secondary | ICD-10-CM

## 2018-01-25 DIAGNOSIS — I351 Nonrheumatic aortic (valve) insufficiency: Secondary | ICD-10-CM

## 2018-01-25 DIAGNOSIS — I480 Paroxysmal atrial fibrillation: Secondary | ICD-10-CM

## 2018-01-25 DIAGNOSIS — Z8679 Personal history of other diseases of the circulatory system: Secondary | ICD-10-CM

## 2018-01-25 DIAGNOSIS — I359 Nonrheumatic aortic valve disorder, unspecified: Secondary | ICD-10-CM

## 2018-01-25 LAB — POCT INR: INR: 2.5 (ref 2.0–3.0)

## 2018-01-25 NOTE — Patient Instructions (Signed)
Description   Continue same  dose of coumadin 5mg  daily except 7.5mg  on Mondays and Fridays. Recheck INR in 3 weeks  Call with any changes [facility] [phone]. 2 servings of dark leafy greens a week

## 2018-02-15 ENCOUNTER — Ambulatory Visit (INDEPENDENT_AMBULATORY_CARE_PROVIDER_SITE_OTHER): Payer: Self-pay | Admitting: *Deleted

## 2018-02-15 DIAGNOSIS — I480 Paroxysmal atrial fibrillation: Secondary | ICD-10-CM

## 2018-02-15 DIAGNOSIS — I359 Nonrheumatic aortic valve disorder, unspecified: Secondary | ICD-10-CM

## 2018-02-15 DIAGNOSIS — I351 Nonrheumatic aortic (valve) insufficiency: Secondary | ICD-10-CM

## 2018-02-15 DIAGNOSIS — Z8679 Personal history of other diseases of the circulatory system: Secondary | ICD-10-CM

## 2018-02-15 DIAGNOSIS — Z9889 Other specified postprocedural states: Secondary | ICD-10-CM

## 2018-02-15 DIAGNOSIS — Z954 Presence of other heart-valve replacement: Secondary | ICD-10-CM

## 2018-02-15 DIAGNOSIS — Z5181 Encounter for therapeutic drug level monitoring: Secondary | ICD-10-CM

## 2018-02-15 LAB — POCT INR: INR: 2.3 (ref 2.0–3.0)

## 2018-02-15 NOTE — Patient Instructions (Addendum)
Description   Today take 10mg  then continue same  dose of coumadin 5mg  daily except 7.5mg  on Mondays and Fridays. Recheck INR in 3 weeks  Call with any changes [facility] [phone]. 2 servings of dark leafy greens a week

## 2018-02-22 ENCOUNTER — Other Ambulatory Visit (HOSPITAL_COMMUNITY): Payer: Self-pay | Admitting: Internal Medicine

## 2018-03-08 ENCOUNTER — Ambulatory Visit (INDEPENDENT_AMBULATORY_CARE_PROVIDER_SITE_OTHER): Payer: Self-pay | Admitting: Pharmacist

## 2018-03-08 DIAGNOSIS — I480 Paroxysmal atrial fibrillation: Secondary | ICD-10-CM

## 2018-03-08 DIAGNOSIS — Z954 Presence of other heart-valve replacement: Secondary | ICD-10-CM

## 2018-03-08 DIAGNOSIS — Z8679 Personal history of other diseases of the circulatory system: Secondary | ICD-10-CM

## 2018-03-08 DIAGNOSIS — I359 Nonrheumatic aortic valve disorder, unspecified: Secondary | ICD-10-CM

## 2018-03-08 DIAGNOSIS — I351 Nonrheumatic aortic (valve) insufficiency: Secondary | ICD-10-CM

## 2018-03-08 DIAGNOSIS — Z9889 Other specified postprocedural states: Secondary | ICD-10-CM

## 2018-03-08 DIAGNOSIS — Z5181 Encounter for therapeutic drug level monitoring: Secondary | ICD-10-CM

## 2018-03-08 LAB — POCT INR: INR: 3.4 — AB (ref 2.0–3.0)

## 2018-03-08 NOTE — Patient Instructions (Signed)
Description   Continue same  dose of coumadin 5mg  daily except 7.5mg  on Mondays and Fridays. Recheck INR in 4 weeks  Call with any changes [facility] [phone]. 2 servings of dark leafy greens a week

## 2018-03-25 ENCOUNTER — Other Ambulatory Visit: Payer: Self-pay | Admitting: Cardiology

## 2018-04-05 ENCOUNTER — Other Ambulatory Visit (HOSPITAL_COMMUNITY): Payer: Self-pay | Admitting: Cardiology

## 2018-04-05 MED ORDER — CARVEDILOL 12.5 MG PO TABS: 12.5000 mg | ORAL_TABLET | Freq: Two times a day (BID) | ORAL | 0 refills | Status: DC

## 2018-04-05 MED ORDER — SPIRONOLACTONE 25 MG PO TABS: ORAL_TABLET | ORAL | 0 refills | Status: DC

## 2018-04-05 MED ORDER — LOSARTAN POTASSIUM 50 MG PO TABS: 75.0000 mg | ORAL_TABLET | Freq: Two times a day (BID) | ORAL | 0 refills | Status: DC

## 2018-04-27 DIAGNOSIS — Z0000 Encounter for general adult medical examination without abnormal findings: Secondary | ICD-10-CM | POA: Diagnosis not present

## 2018-05-06 ENCOUNTER — Other Ambulatory Visit: Payer: Self-pay | Admitting: Cardiology

## 2018-05-10 ENCOUNTER — Other Ambulatory Visit: Payer: Self-pay | Admitting: Pharmacist

## 2018-05-10 MED ORDER — WARFARIN SODIUM 5 MG PO TABS: ORAL_TABLET | ORAL | 0 refills | Status: DC

## 2018-05-10 NOTE — Telephone Encounter (Signed)
LMOM to schedule f/u as overdue.

## 2018-05-11 ENCOUNTER — Ambulatory Visit (INDEPENDENT_AMBULATORY_CARE_PROVIDER_SITE_OTHER): Payer: BLUE CROSS/BLUE SHIELD | Admitting: *Deleted

## 2018-05-11 DIAGNOSIS — Z8679 Personal history of other diseases of the circulatory system: Secondary | ICD-10-CM | POA: Diagnosis not present

## 2018-05-11 DIAGNOSIS — I351 Nonrheumatic aortic (valve) insufficiency: Secondary | ICD-10-CM | POA: Diagnosis not present

## 2018-05-11 DIAGNOSIS — Z5181 Encounter for therapeutic drug level monitoring: Secondary | ICD-10-CM | POA: Diagnosis not present

## 2018-05-11 DIAGNOSIS — Z9889 Other specified postprocedural states: Secondary | ICD-10-CM

## 2018-05-11 DIAGNOSIS — I480 Paroxysmal atrial fibrillation: Secondary | ICD-10-CM | POA: Diagnosis not present

## 2018-05-11 DIAGNOSIS — I359 Nonrheumatic aortic valve disorder, unspecified: Secondary | ICD-10-CM

## 2018-05-11 DIAGNOSIS — Z954 Presence of other heart-valve replacement: Secondary | ICD-10-CM

## 2018-05-11 LAB — POCT INR: INR: 2.2 (ref 2.0–3.0)

## 2018-05-12 DIAGNOSIS — I10 Essential (primary) hypertension: Secondary | ICD-10-CM | POA: Diagnosis not present

## 2018-05-12 DIAGNOSIS — N529 Male erectile dysfunction, unspecified: Secondary | ICD-10-CM | POA: Diagnosis not present

## 2018-05-12 DIAGNOSIS — Z0000 Encounter for general adult medical examination without abnormal findings: Secondary | ICD-10-CM | POA: Diagnosis not present

## 2018-05-12 DIAGNOSIS — R5383 Other fatigue: Secondary | ICD-10-CM | POA: Diagnosis not present

## 2018-05-24 ENCOUNTER — Other Ambulatory Visit (HOSPITAL_COMMUNITY): Payer: Self-pay | Admitting: Internal Medicine

## 2018-06-01 ENCOUNTER — Ambulatory Visit (INDEPENDENT_AMBULATORY_CARE_PROVIDER_SITE_OTHER): Payer: BLUE CROSS/BLUE SHIELD | Admitting: *Deleted

## 2018-06-01 DIAGNOSIS — I359 Nonrheumatic aortic valve disorder, unspecified: Secondary | ICD-10-CM | POA: Diagnosis not present

## 2018-06-01 DIAGNOSIS — I480 Paroxysmal atrial fibrillation: Secondary | ICD-10-CM

## 2018-06-01 DIAGNOSIS — Z954 Presence of other heart-valve replacement: Secondary | ICD-10-CM

## 2018-06-01 DIAGNOSIS — Z5181 Encounter for therapeutic drug level monitoring: Secondary | ICD-10-CM | POA: Diagnosis not present

## 2018-06-01 DIAGNOSIS — I351 Nonrheumatic aortic (valve) insufficiency: Secondary | ICD-10-CM | POA: Diagnosis not present

## 2018-06-01 DIAGNOSIS — Z9889 Other specified postprocedural states: Secondary | ICD-10-CM

## 2018-06-01 DIAGNOSIS — Z8679 Personal history of other diseases of the circulatory system: Secondary | ICD-10-CM

## 2018-06-01 LAB — POCT INR: INR: 4 — AB (ref 2.0–3.0)

## 2018-06-01 NOTE — Patient Instructions (Signed)
Description   Do not take any Coumadin tomorrow then continue same  dose of coumadin 5mg  daily except 7.5mg  on Mondays, Wednesdays  and Fridays. Recheck INR in 3 weeks  Call with any changes [facility] [phone]. 2 servings of dark leafy greens a week

## 2018-06-02 ENCOUNTER — Telehealth (HOSPITAL_COMMUNITY): Payer: Self-pay

## 2018-06-02 DIAGNOSIS — I5022 Chronic systolic (congestive) heart failure: Secondary | ICD-10-CM

## 2018-06-02 NOTE — Telephone Encounter (Signed)
Opened in error

## 2018-06-07 ENCOUNTER — Ambulatory Visit (HOSPITAL_COMMUNITY)
Admission: RE | Admit: 2018-06-07 | Discharge: 2018-06-07 | Disposition: A | Discharge status: Home / Self Care | Payer: BLUE CROSS/BLUE SHIELD | Source: Ambulatory Visit | Attending: Cardiology | Admitting: Cardiology

## 2018-06-07 ENCOUNTER — Other Ambulatory Visit: Payer: Self-pay

## 2018-06-07 ENCOUNTER — Ambulatory Visit (HOSPITAL_BASED_OUTPATIENT_CLINIC_OR_DEPARTMENT_OTHER)
Admission: RE | Admit: 2018-06-07 | Discharge: 2018-06-07 | Disposition: A | Discharge status: Home / Self Care | Payer: BLUE CROSS/BLUE SHIELD | Source: Ambulatory Visit | Attending: Cardiology | Admitting: Cardiology

## 2018-06-07 VITALS — BP 130/86 | HR 62 | Wt 178.6 lb

## 2018-06-07 DIAGNOSIS — Z9889 Other specified postprocedural states: Secondary | ICD-10-CM

## 2018-06-07 DIAGNOSIS — I4891 Unspecified atrial fibrillation: Secondary | ICD-10-CM | POA: Insufficient documentation

## 2018-06-07 DIAGNOSIS — Z952 Presence of prosthetic heart valve: Secondary | ICD-10-CM | POA: Diagnosis not present

## 2018-06-07 DIAGNOSIS — I5022 Chronic systolic (congestive) heart failure: Secondary | ICD-10-CM | POA: Insufficient documentation

## 2018-06-07 DIAGNOSIS — I359 Nonrheumatic aortic valve disorder, unspecified: Secondary | ICD-10-CM | POA: Diagnosis not present

## 2018-06-07 DIAGNOSIS — R001 Bradycardia, unspecified: Secondary | ICD-10-CM | POA: Insufficient documentation

## 2018-06-07 LAB — COMPREHENSIVE METABOLIC PANEL
ALT: 18 U/L (ref 0–44)
AST: 20 U/L (ref 15–41)
Albumin: 4.1 g/dL (ref 3.5–5.0)
Alkaline Phosphatase: 47 U/L (ref 38–126)
Anion gap: 4 — ABNORMAL LOW (ref 5–15)
BUN: 12 mg/dL (ref 6–20)
CO2: 26 mmol/L (ref 22–32)
Calcium: 8.9 mg/dL (ref 8.9–10.3)
Chloride: 108 mmol/L (ref 98–111)
Creatinine, Ser: 0.98 mg/dL (ref 0.61–1.24)
GFR calc Af Amer: >60 mL/min (ref >60)
GFR calc non Af Amer: >60 mL/min (ref >60)
Glucose, Bld: 107 mg/dL — ABNORMAL HIGH (ref 70–99)
Potassium: 4.3 mmol/L (ref 3.5–5.1)
Sodium: 138 mmol/L (ref 135–145)
Total Bilirubin: 0.8 mg/dL (ref 0.3–1.2)
Total Protein: 6.7 g/dL (ref 6.5–8.1)

## 2018-06-07 LAB — CBC
HCT: 36.1 % — ABNORMAL LOW (ref 39.0–52.0)
Hemoglobin: 10.5 g/dL — ABNORMAL LOW (ref 13.0–17.0)
MCH: 24.4 pg — ABNORMAL LOW (ref 26.0–34.0)
MCHC: 29.1 g/dL — ABNORMAL LOW (ref 30.0–36.0)
MCV: 83.8 fL (ref 80.0–100.0)
Platelets: 218 10*3/uL (ref 150–400)
RBC: 4.31 MIL/uL (ref 4.22–5.81)
RDW: 18.6 % — ABNORMAL HIGH (ref 11.5–15.5)
WBC: 6.7 10*3/uL (ref 4.0–10.5)
nRBC: 0 % (ref 0.0–0.2)

## 2018-06-07 MED ORDER — CARVEDILOL 12.5 MG PO TABS: 12.5000 mg | ORAL_TABLET | Freq: Two times a day (BID) | ORAL | 6 refills | Status: DC

## 2018-06-07 MED ORDER — SPIRONOLACTONE 25 MG PO TABS: ORAL_TABLET | ORAL | 6 refills | Status: DC

## 2018-06-07 MED ORDER — LOSARTAN POTASSIUM 50 MG PO TABS: 75.0000 mg | ORAL_TABLET | Freq: Two times a day (BID) | ORAL | 6 refills | Status: DC

## 2018-06-07 NOTE — [facility] Notes (Signed)
  Echocardiogram 2D Echocardiogram has been performed.  [name] Hinton 06/07/2018, 1:48 PM

## 2018-06-07 NOTE — Patient Instructions (Signed)
Labs today We will only contact you if something comes back abnormal or we need to make some changes. Otherwise no news is good news!  Your physician recommends that you have lab work completed every 3 months.   Your physician recommends that you schedule a follow-up appointment in: 6 months. CALL IN MARCH TO SCHEDULE AN APPOINTMENT

## 2018-06-08 NOTE — [facility] Notes (Signed)
Patient ID: [name] Hinton, male   DOB: [birth year], 47 y.o.   MRN: [id number] PCP: Dr. Ashby Dawes Cardiology: Dr. Aundra Dubin  [name] Hinton is a 47 y.o.with a prior history of alcohol abuse and former smoker (quit  2011). Hospitalized 1/15 with increased dyspnea-->ECHO performed and showed EF 25%, global hypokinesis, RV mildly dilated,severe aortic insufficiency.  Patient had new-onset acute systolic CHF and atrial flutter. Started on cardizem drip and Xarelto. Initially in A flutter--> became A fib.   He had TEE and DC-CV on 08/01/13.  Dr Roxy Manns provided consultation due to ascending aortic aneurysm and bicuspid valve with severe AI. He was discharged on ACEI, beta blocker, spironolactone, and xarelto. Discharge weight 150 pounds.    Had cough with lisinopril and switched to losartan.  LHC/RHC was done 2/15 showing no coronary disease and elevated PCWP with pulmonary hypertension.   In 3/15, he had replacement of ascending aortic and aortic valve with Bentall procedure with mechanical aortic valve.  He also had Maze procedure and LAA obliteration.  He was noted to have gone into atypical atrial flutter by the time of his 3/30 followup with Dr. Roxy Manns.  He was started on amiodarone and went back into NSR. He is back at work.  Echo in 4/15 showed normally functioning mechanical aortic valve, but EF remained 30-35%.   Echo in 4/15 showed normally functioning mechanical aortic valve, but EF remained 30-35%.  ECHO 01/2014  EF up to 45% with normal mechanical aortic valve.  Echo 9/17 with increase in EF to 55-60% with mean gradient 13 mmHg across mechanical AoV.    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  No other neurological symptoms.   In 3/18, he was admitted with lower GI bleeding/symptomatic anemia.  He received 2 units PRBCs.  Suspected to be hemorrhoidal.  He had a colonoscopy confirming hemorrhoids and showing 1 polyp that was removed.    Echo was done today and reviewed. EF 55% with  mild LVH, normal RV size with mildly decreased systolic function, normal mechanical aortic valve.   Symptomatically, he has been doing quite well.  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  No exertional dyspnea or chest pain.  Not getting much exercise due to work. No lightheadedness or syncope.  No palpitations.  Weight has gone up 13 lbs over the last year.    ECG (Personally reviewed): NSR, nonspecific T wave flattening  Labs (1/15): K 5.2, creatinine 1.3, AST 40, ALT 58 Labs (2/15): K 4.7, creatinine 1.1 Labs (4/15): K 4.9, creatinine 1.0, LFTs normal, TSH normal Labs (5/15): K 4.6, creatinine 1.1, BNP 134 Labs (6/15): K 4.8  creatinine 1.0. INR 2.4 Labs (03/01/14): K 4.7 Creatinine 1.16  Labs (11/15): K 4.8, creatinine 0.79 Labs (3/18): K 4.4, creatinine 0.8, hgb 10.2 Labs (7/18): K 4.2, creatinine 0.86, hgb 9.8  PMH:  1. A fib/Aflutter: Initially flutter during 1/15 admission, degenerated to fibrillation --> S/P TEE DC-CV to NSR. Atypical atrial flutter recurred post-op in 3/15. 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  2. 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  TEE (1/15) with EF 25%, moderate LV dilation, bicuspid aortic valve with severe eccentric AI and no AS, ascending aorta 5.0 cm, moderately decreased RV systolic function.  CTA chest (1/15) with 5.1 cm ascending aortic aneurysm. Patient had Bentall replacement of ascending aorta with mechanical aortic valve and Maze with LAA obliteration in 3/15.  3. Cardiomyopathy with systolic CHF: As above, TEE with  EF 25% and moderately dilated LV, moderately decreased RV systolic function.  Tachy-mediated CMP versus ETOH CMP versus long-standing AI versus a combination of the three.  LHC/RHC (2/15): no coronary disease, mean RA 5, PA 62/25, mean PCWP 22, CI 1.8, PVR 5.2 WU, LVEDP 35 (severe AI).  Echo (2/15): EF 35-40%, moderately dilated LV, bicuspid aortic valve with severe AI.  Echo  (4/15) with mildly dilated LV, mild LVH, EF 30-35%, normal RV size and systolic function, mechanical aortic valve appeared to function normally, IVC small.  Echo (7/15) with EF 45%, normal mechanical aortic valve, normal RV size with mildly decreased systolic function.  - Echo (9/17) with EF 55-60%, mechanical aortic valve with mean gradient 13 mmHg.  - Echo (11/19) with EF 55% with mild LVH, normal RV size with mildly decreased systolic function, normal mechanical aortic valve.  4. Former smoker quit 2011 5. Alcohol abuse but has cut back considerably.  6. ACEI cough 7. Palpitations: Holter monitor (12/15) with occasional PVCs and PACs.   FH: Father- Heart Failure, Grandfather CAD CABG   SH: Quit smoking 2011. Heavy ETOH until 1/15.  Engaged.  Works as Freight forwarder at [employer].   ROS: All systems negative except as listed in HPI, PMH and Problem List.  Current Outpatient Medications  Medication Sig Dispense Refill  . aspirin EC 81 MG tablet Take 1 tablet (81 mg total) by mouth daily. 90 tablet 3  . carvedilol (COREG) 12.5 MG tablet Take 1 tablet (12.5 mg total) by mouth 2 (two) times daily. NEEDS OFFICE VISIT FOR FURTHER REFILLS [phone] 60 tablet 6  . docusate sodium (COLACE) 100 MG capsule Take 1 capsule (100 mg total) by mouth 2 (two) times daily. 10 capsule 0  . losartan (COZAAR) 50 MG tablet Take 1.5 tablets (75 mg total) by mouth 2 (two) times daily. 90 tablet 6  . spironolactone (ALDACTONE) 25 MG tablet TAKE 1 TABLET(25 MG) BY MOUTH DAILY 30 tablet 6  . warfarin (COUMADIN) 5 MG tablet TAKE AS DIRECTED BY COUMADIN CLINIC 40 tablet 1  . warfarin (COUMADIN) 5 MG tablet Take 1 to 1.5 tablets daily as directed by Coumadin clinic 5 tablet 0  . hydrocortisone (ANUSOL-HC) 2.5 % rectal cream Place 1 application rectally 2 (two) times daily. 30 g 0   No current facility-administered medications for this encounter.    PHYSICAL EXAM: Vitals:   06/07/18 1348  BP: 130/86  Pulse: 62  SpO2: 99%   Weight: 81 kg (178 lb 9.6 oz)   General: NAD Neck: No JVD, no thyromegaly or thyroid nodule.  Lungs: Clear to auscultation bilaterally with normal respiratory effort. CV: Nondisplaced PMI.  Heart regular S1/S2 with mechanical S2, no S3/S4, 1/6 SEM RUSB.  No peripheral edema.  No carotid bruit.  Normal pedal pulses.  Abdomen: Soft, nontender, no hepatosplenomegaly, no distention.  Skin: Intact without lesions or rashes.  Neurologic: Alert and oriented x 3.  Psych: Normal affect. Extremities: No clubbing or cyanosis.  HEENT: Normal.   ASSESSMENT & PLAN: 1. Chronic systolic CHF: Nonischemic cardiomyopathy.  Tachy-mediated CMP versus ETOH CMP versus long-standing aortic insufficiency or (most likely) a combination of all 3 possible causes.  He is no longer drinking and has had mechanical AVR.  Echo was done today and reviewed, EF is normal at 55%.       - Continue Coreg to 12.5 mg bid.  - Continue losartan to 75 mg bid. Intolerant ace due to cough.  - Continue spironolactone 25 mg daily.   -  BMET today (should get at least every 3 months, will arrange).  2. Paroxysmal atrial fibrillation/flutter: s/p TEE/DCCV 08/01/13. Had Maze/LAA obliteration with Bentall.  Post-op atypical flutter.  Remaining in NSR. No longer on amiodarone.   3. Bicuspid aortic valve disorder with severe AI and severely dilated ascending aorta (5.1 cm on CTA).  Status post mechanical AVR + ascending aorta replacement.  The valve looked good on 7/15 echo. Episode of possible amaurosis fugax about a year ago.  He is now on ASA 81 mg daily (which will be continued) and on warfarin with goal INR 2.5-3.5. INR managed [facility].  - Reinforced need for SBE prophylaxis - CBC today.  4. ETOH abuse: Occasional ETOH, not heavy as in the past.   5. HTN: Stable. Continue current regimen. 6. Lower GI bleeding: Thought to be hemorrhoidal.  Minimal recently.   Followup 6 months with me.   Madalyne Husk,MD 06/08/2018

## 2018-06-22 ENCOUNTER — Ambulatory Visit (INDEPENDENT_AMBULATORY_CARE_PROVIDER_SITE_OTHER): Payer: BLUE CROSS/BLUE SHIELD | Admitting: *Deleted

## 2018-06-22 DIAGNOSIS — I351 Nonrheumatic aortic (valve) insufficiency: Secondary | ICD-10-CM

## 2018-06-22 DIAGNOSIS — Z954 Presence of other heart-valve replacement: Secondary | ICD-10-CM

## 2018-06-22 DIAGNOSIS — I480 Paroxysmal atrial fibrillation: Secondary | ICD-10-CM

## 2018-06-22 DIAGNOSIS — Z5181 Encounter for therapeutic drug level monitoring: Secondary | ICD-10-CM

## 2018-06-22 DIAGNOSIS — I359 Nonrheumatic aortic valve disorder, unspecified: Secondary | ICD-10-CM | POA: Diagnosis not present

## 2018-06-22 DIAGNOSIS — Z9889 Other specified postprocedural states: Secondary | ICD-10-CM

## 2018-06-22 DIAGNOSIS — Z8679 Personal history of other diseases of the circulatory system: Secondary | ICD-10-CM

## 2018-06-22 LAB — POCT INR: INR: 4.6 — AB (ref 2.0–3.0)

## 2018-06-22 NOTE — Patient Instructions (Signed)
Description   Do not take any Coumadin tomorrow then start taking 5mg  daily except 7.5mg  on Mondays and Fridays. Recheck INR in 2 weeks  Call with any changes [facility] [phone]. 2 servings of dark leafy greens a week

## 2018-07-02 ENCOUNTER — Other Ambulatory Visit (HOSPITAL_COMMUNITY): Payer: Self-pay | Admitting: Cardiology

## 2018-07-15 DIAGNOSIS — R5383 Other fatigue: Secondary | ICD-10-CM | POA: Diagnosis not present

## 2018-07-20 ENCOUNTER — Ambulatory Visit (INDEPENDENT_AMBULATORY_CARE_PROVIDER_SITE_OTHER): Payer: BLUE CROSS/BLUE SHIELD | Admitting: *Deleted

## 2018-07-20 ENCOUNTER — Other Ambulatory Visit (HOSPITAL_COMMUNITY): Payer: Self-pay | Admitting: Internal Medicine

## 2018-07-20 DIAGNOSIS — I480 Paroxysmal atrial fibrillation: Secondary | ICD-10-CM | POA: Diagnosis not present

## 2018-07-20 DIAGNOSIS — Z9889 Other specified postprocedural states: Secondary | ICD-10-CM | POA: Diagnosis not present

## 2018-07-20 DIAGNOSIS — Z8679 Personal history of other diseases of the circulatory system: Secondary | ICD-10-CM

## 2018-07-20 DIAGNOSIS — Z954 Presence of other heart-valve replacement: Secondary | ICD-10-CM | POA: Diagnosis not present

## 2018-07-20 DIAGNOSIS — Z5181 Encounter for therapeutic drug level monitoring: Secondary | ICD-10-CM

## 2018-07-20 DIAGNOSIS — I359 Nonrheumatic aortic valve disorder, unspecified: Secondary | ICD-10-CM

## 2018-07-20 DIAGNOSIS — I351 Nonrheumatic aortic (valve) insufficiency: Secondary | ICD-10-CM | POA: Diagnosis not present

## 2018-07-20 LAB — POCT INR: INR: 4 — AB (ref 2.0–3.0)

## 2018-07-20 MED ORDER — WARFARIN SODIUM 5 MG PO TABS: ORAL_TABLET | ORAL | 1 refills | Status: DC

## 2018-07-20 NOTE — Patient Instructions (Signed)
Description   Do not take any Coumadin today's  then start taking 5mg  daily except 7.5mg  on Mondays. Recheck INR in 2 weeks  Call with any changes [facility] [phone]. 2 servings of dark leafy greens a week

## 2018-07-22 ENCOUNTER — Other Ambulatory Visit (HOSPITAL_COMMUNITY): Payer: Self-pay

## 2018-07-22 DIAGNOSIS — R5383 Other fatigue: Secondary | ICD-10-CM | POA: Diagnosis not present

## 2018-07-22 DIAGNOSIS — I10 Essential (primary) hypertension: Secondary | ICD-10-CM | POA: Diagnosis not present

## 2018-07-22 DIAGNOSIS — Z952 Presence of prosthetic heart valve: Secondary | ICD-10-CM | POA: Diagnosis not present

## 2018-07-22 DIAGNOSIS — D500 Iron deficiency anemia secondary to blood loss (chronic): Secondary | ICD-10-CM | POA: Diagnosis not present

## 2018-07-22 MED ORDER — LOSARTAN POTASSIUM 50 MG PO TABS: 75.0000 mg | ORAL_TABLET | Freq: Two times a day (BID) | ORAL | 6 refills | Status: DC

## 2018-07-27 ENCOUNTER — Telehealth (HOSPITAL_COMMUNITY): Payer: Self-pay

## 2018-07-27 NOTE — Telephone Encounter (Signed)
Pt called to report SOB and fatigue. Pt states his PCP did a CBC on 07/22/2018 and his hemoglobin was 9.7. Pt also states he is anemic and his PCP recommended that he call our office. No other symptoms to report. Please advise.

## 2018-07-27 NOTE — Telephone Encounter (Signed)
SOB and fatigue may be related to his anemia. His EF was normal in November and he has not required diuretics in the past. His hemoglobin is not that low, but he has not had labs here in a while so I don't have anything to compare to for a baseline. His last INR was elevated to 4.0 and should have been adjusted by coumadin clinic. Is he bleeding anywhere? Anemia is something his PCP needs to address. If he needs coumadin adjustment, he should call coumadin clinic. Thanks.

## 2018-07-27 NOTE — Telephone Encounter (Signed)
Pt.notified

## 2018-07-28 DIAGNOSIS — K642 Third degree hemorrhoids: Secondary | ICD-10-CM | POA: Diagnosis not present

## 2018-08-26 ENCOUNTER — Ambulatory Visit (INDEPENDENT_AMBULATORY_CARE_PROVIDER_SITE_OTHER): Payer: BLUE CROSS/BLUE SHIELD | Admitting: *Deleted

## 2018-08-26 DIAGNOSIS — I359 Nonrheumatic aortic valve disorder, unspecified: Secondary | ICD-10-CM

## 2018-08-26 DIAGNOSIS — Z8679 Personal history of other diseases of the circulatory system: Secondary | ICD-10-CM | POA: Diagnosis not present

## 2018-08-26 DIAGNOSIS — I480 Paroxysmal atrial fibrillation: Secondary | ICD-10-CM | POA: Diagnosis not present

## 2018-08-26 DIAGNOSIS — Z954 Presence of other heart-valve replacement: Secondary | ICD-10-CM

## 2018-08-26 DIAGNOSIS — Z9889 Other specified postprocedural states: Secondary | ICD-10-CM | POA: Diagnosis not present

## 2018-08-26 DIAGNOSIS — I351 Nonrheumatic aortic (valve) insufficiency: Secondary | ICD-10-CM | POA: Diagnosis not present

## 2018-08-26 DIAGNOSIS — Z5181 Encounter for therapeutic drug level monitoring: Secondary | ICD-10-CM

## 2018-08-26 LAB — POCT INR: INR: 4.4 — AB (ref 2.0–3.0)

## 2018-08-26 NOTE — Patient Instructions (Addendum)
Description   Do not take any Coumadin tomorrow then start taking 5mg  daily. Recheck INR in 2 weeks. Call with any changes [facility] [phone]. 2 servings of dark leafy greens a week

## 2018-09-07 ENCOUNTER — Ambulatory Visit (HOSPITAL_COMMUNITY)
Admission: RE | Admit: 2018-09-07 | Discharge: 2018-09-07 | Disposition: A | Discharge status: Home / Self Care | Payer: BLUE CROSS/BLUE SHIELD | Source: Ambulatory Visit | Attending: Cardiology | Admitting: Cardiology

## 2018-09-07 DIAGNOSIS — I5022 Chronic systolic (congestive) heart failure: Secondary | ICD-10-CM | POA: Diagnosis not present

## 2018-09-07 LAB — BASIC METABOLIC PANEL
Anion gap: 9 (ref 5–15)
BUN: 14 mg/dL (ref 6–20)
CO2: 23 mmol/L (ref 22–32)
Calcium: 9.3 mg/dL (ref 8.9–10.3)
Chloride: 105 mmol/L (ref 98–111)
Creatinine, Ser: 0.89 mg/dL (ref 0.61–1.24)
GFR calc Af Amer: >60 mL/min (ref >60)
GFR calc non Af Amer: >60 mL/min (ref >60)
Glucose, Bld: 115 mg/dL — ABNORMAL HIGH (ref 70–99)
Potassium: 5 mmol/L (ref 3.5–5.1)
Sodium: 137 mmol/L (ref 135–145)

## 2018-09-22 ENCOUNTER — Encounter (HOSPITAL_COMMUNITY): Payer: Self-pay

## 2018-10-07 ENCOUNTER — Other Ambulatory Visit: Payer: Self-pay | Admitting: Pharmacist

## 2018-10-07 ENCOUNTER — Telehealth: Payer: Self-pay

## 2018-10-07 NOTE — Telephone Encounter (Signed)

## 2018-10-07 NOTE — Telephone Encounter (Signed)
New Message    *STAT* If patient is at the pharmacy, call can be transferred to refill team.   1. Which medications need to be refilled? (please list name of each medication and dose if known) Warfarin 5mg   2. Which pharmacy/location (including street and city if local pharmacy) is medication to be sent to? [facility] and [address]  3. Do they need a 30 day or 90 day supply? 90 day supply

## 2018-10-07 NOTE — Telephone Encounter (Signed)
Pt overdue for follow-up last seen 08/27/18, missed f/u appt on 09/06/18.  Attempted to contact pt, LMOM to call back for COVID-19 prescreening questions and also to acknowledge we received refill rx, but since he is overdue for f/u we will refill at [place] tomorrow.

## 2018-10-08 ENCOUNTER — Other Ambulatory Visit: Payer: Self-pay

## 2018-10-08 ENCOUNTER — Ambulatory Visit (INDEPENDENT_AMBULATORY_CARE_PROVIDER_SITE_OTHER): Payer: BLUE CROSS/BLUE SHIELD | Admitting: Pharmacist

## 2018-10-08 DIAGNOSIS — Z5181 Encounter for therapeutic drug level monitoring: Secondary | ICD-10-CM

## 2018-10-08 DIAGNOSIS — Z954 Presence of other heart-valve replacement: Secondary | ICD-10-CM

## 2018-10-08 DIAGNOSIS — I351 Nonrheumatic aortic (valve) insufficiency: Secondary | ICD-10-CM

## 2018-10-08 DIAGNOSIS — Z8679 Personal history of other diseases of the circulatory system: Secondary | ICD-10-CM | POA: Diagnosis not present

## 2018-10-08 DIAGNOSIS — Z9889 Other specified postprocedural states: Secondary | ICD-10-CM

## 2018-10-08 DIAGNOSIS — I359 Nonrheumatic aortic valve disorder, unspecified: Secondary | ICD-10-CM | POA: Diagnosis not present

## 2018-10-08 DIAGNOSIS — I480 Paroxysmal atrial fibrillation: Secondary | ICD-10-CM | POA: Diagnosis not present

## 2018-10-08 LAB — POCT INR: INR: 1.9 — AB (ref 2.0–3.0)

## 2018-10-08 MED ORDER — WARFARIN SODIUM 5 MG PO TABS: ORAL_TABLET | ORAL | 1 refills | Status: DC

## 2018-10-08 NOTE — Telephone Encounter (Signed)
Patient came to appointment and refill sent in

## 2018-10-08 NOTE — Patient Instructions (Signed)
Take 1.5 tablets tonight and tomorrow then continue taking 5mg  daily. Recheck INR in 3 weeks. Call with any changes [facility] [phone]. 2 servings of dark leafy greens a week

## 2018-10-13 ENCOUNTER — Encounter (HOSPITAL_COMMUNITY): Payer: Self-pay | Admitting: Emergency Medicine

## 2018-10-13 ENCOUNTER — Emergency Department (HOSPITAL_COMMUNITY)
Admission: EM | Admit: 2018-10-13 | Discharge: 2018-10-13 | Disposition: A | Discharge status: Home / Self Care | Payer: BLUE CROSS/BLUE SHIELD | Attending: Emergency Medicine | Admitting: Emergency Medicine

## 2018-10-13 ENCOUNTER — Other Ambulatory Visit: Payer: Self-pay

## 2018-10-13 DIAGNOSIS — R410 Disorientation, unspecified: Secondary | ICD-10-CM | POA: Diagnosis present

## 2018-10-13 DIAGNOSIS — Z7901 Long term (current) use of anticoagulants: Secondary | ICD-10-CM | POA: Diagnosis not present

## 2018-10-13 DIAGNOSIS — I480 Paroxysmal atrial fibrillation: Secondary | ICD-10-CM | POA: Diagnosis not present

## 2018-10-13 DIAGNOSIS — Z7982 Long term (current) use of aspirin: Secondary | ICD-10-CM | POA: Insufficient documentation

## 2018-10-13 DIAGNOSIS — I5022 Chronic systolic (congestive) heart failure: Secondary | ICD-10-CM | POA: Insufficient documentation

## 2018-10-13 DIAGNOSIS — F10220 Alcohol dependence with intoxication, uncomplicated: Secondary | ICD-10-CM | POA: Diagnosis not present

## 2018-10-13 DIAGNOSIS — F10920 Alcohol use, unspecified with intoxication, uncomplicated: Secondary | ICD-10-CM

## 2018-10-13 DIAGNOSIS — F17200 Nicotine dependence, unspecified, uncomplicated: Secondary | ICD-10-CM | POA: Insufficient documentation

## 2018-10-13 DIAGNOSIS — Z79899 Other long term (current) drug therapy: Secondary | ICD-10-CM | POA: Diagnosis not present

## 2018-10-13 LAB — CBC WITH DIFFERENTIAL/PLATELET
Abs Immature Granulocytes: 0.03 10*3/uL (ref 0.00–0.07)
Basophils Absolute: 0.1 10*3/uL (ref 0.0–0.1)
Basophils Relative: 1 %
Eosinophils Absolute: 0.2 10*3/uL (ref 0.0–0.5)
Eosinophils Relative: 3 %
HCT: 46.1 % (ref 39.0–52.0)
Hemoglobin: 13.9 g/dL (ref 13.0–17.0)
Immature Granulocytes: 0 %
Lymphocytes Relative: 21 %
Lymphs Abs: 1.9 10*3/uL (ref 0.7–4.0)
MCH: 25 pg — ABNORMAL LOW (ref 26.0–34.0)
MCHC: 30.2 g/dL (ref 30.0–36.0)
MCV: 82.8 fL (ref 80.0–100.0)
Monocytes Absolute: 0.8 10*3/uL (ref 0.1–1.0)
Monocytes Relative: 9 %
Neutro Abs: 6.2 10*3/uL (ref 1.7–7.7)
Neutrophils Relative %: 66 %
Platelets: 277 10*3/uL (ref 150–400)
RBC: 5.57 MIL/uL (ref 4.22–5.81)
RDW: 19 % — ABNORMAL HIGH (ref 11.5–15.5)
WBC: 9.2 10*3/uL (ref 4.0–10.5)
nRBC: 0 % (ref 0.0–0.2)

## 2018-10-13 LAB — COMPREHENSIVE METABOLIC PANEL
ALT: 21 U/L (ref 0–44)
AST: 24 U/L (ref 15–41)
Albumin: 4.7 g/dL (ref 3.5–5.0)
Alkaline Phosphatase: 71 U/L (ref 38–126)
Anion gap: 12 (ref 5–15)
BUN: 16 mg/dL (ref 6–20)
CO2: 19 mmol/L — ABNORMAL LOW (ref 22–32)
Calcium: 9.5 mg/dL (ref 8.9–10.3)
Chloride: 107 mmol/L (ref 98–111)
Creatinine, Ser: 0.84 mg/dL (ref 0.61–1.24)
GFR calc Af Amer: >60 mL/min (ref >60)
GFR calc non Af Amer: >60 mL/min (ref >60)
Glucose, Bld: 86 mg/dL (ref 70–99)
Potassium: 3.6 mmol/L (ref 3.5–5.1)
Sodium: 138 mmol/L (ref 135–145)
Total Bilirubin: 1 mg/dL (ref 0.3–1.2)
Total Protein: 7.7 g/dL (ref 6.5–8.1)

## 2018-10-13 LAB — TROPONIN I: Troponin I: <0.03 ng/mL (ref <0.03)

## 2018-10-13 LAB — PROTIME-INR
INR: 2.8 — ABNORMAL HIGH (ref 0.8–1.2)
Prothrombin Time: 29.2 seconds — ABNORMAL HIGH (ref 11.4–15.2)

## 2018-10-13 LAB — ETHANOL: Alcohol, Ethyl (B): 252 mg/dL — ABNORMAL HIGH (ref <10)

## 2018-10-13 NOTE — ED Provider Notes (Signed)
[facility] EMERGENCY DEPARTMENT Provider Note   CSN: [id number] Arrival date & time: 10/13/18  0117    History   Chief Complaint Chief Complaint  Patient presents with  . Diaphoretic    HPI [name] Hinton is a 48 y.o. male.     HPI  48 year old with history of paroxysmal A. fib, nonischemic cardiomyopathy, valvular disorder status post valve replacement on Coumadin comes in with chief complaint of sweating and confusion.  Patient is not a good historian and is hesitant at providing meaningful history.  He did allow [place] to speak with his wife [name] Hinton. I spoke to patient's wife while I was in his room.  She reported that patient just lost his job about a week ago.  He has been out of it the whole time since then and she has just thought that maybe he was depressed.  Today he came up to her when she was at her neighbor's place and stated that he was feeling sweaty.  Patient appeared pale at that time.  She went home to attend to him and asked him not to cook anything, but 5 or 10 minutes later when she came back patient was cooking hotdogs and laying in the couch.  Patient told her that he had no recollection of putting hot dogs on the stove.  Additionally he was mumbling and stuttering.  Patient has had history of severe anemia which required transfusion and she got concerned that he was anemic again.  Upon further questioning she states that patient had drank alcohol earlier in the day.  When I discussed patient's wife's concern with him, patient admitted to drinking 2 shots.  He stated that his wife was very loving and caring in nature and was concerned about his wellbeing.  He thinks she was just paranoid with everything going on in media.  He has no complaints and would rather go home.  Past Medical History:  Diagnosis Date  . Dysrhythmia   . ETOH abuse   . Heart murmur   . 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 Non-ischemic cardiomyopathy ([place])   . PAF  (paroxysmal atrial fibrillation) ([place])    a. Dx 07/2013, s/p TEE/DCCV.  . Paroxysmal atrial flutter ([place])    a. Dx 07/2013.  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 S/P Maze operation for atrial fibrillation 09/27/2013   Complete bilateral atrial lesion set using bipolar radiofrequency and cryothermy ablation with clipping of LA appendage  . Severe aortic insufficiency    a. Bicuspid aortic valve with severe AI, dilated ascending aorta dx 07/2013.  [name] Systolic CHF ([place])    a. Dx 07/2013: EF 25%, felt likely related to combo of EtOH + tachy-mediated + AI. LHC pending.    Patient Active Problem List   Diagnosis Date Noted  . Lower GI bleed   . Symptomatic anemia 10/04/2016  . Amaurosis fugax 10/29/2013  . Aortic valve disorders 10/05/2013  . 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 S/P Maze operation for atrial fibrillation 09/27/2013  . 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 Non-ischemic cardiomyopathy ([place])   . Chronic systolic CHF (congestive heart failure) ([place]) 08/10/2013  . Bicuspid aortic valve 08/10/2013  . AF (paroxysmal atrial fibrillation) ([place]) 08/10/2013    Past Surgical History:  Procedure Laterality Date  .  ASCENDING AORTIC ROOT REPLACEMENT N/A 09/27/2013   Procedure: ASCENDING AORTIC ROOT REPLACEMENT;  Surgeon: [name], MD;  Location: [facility];  Service: Open Heart Surgery;  Laterality: N/A;  . BENTALL PROCEDURE N/A 09/27/2013   Procedure: BENTALL PROCEDURE;  Surgeon: [name], MD;  Location: [facility];  Service: Open Heart Surgery;  Laterality: N/A;  . CARDIAC CATHETERIZATION    . CARDIOVERSION N/A 08/01/2013   Procedure: CARDIOVERSION;  Surgeon: [name], MD;  Location: [facility];  Service: Cardiovascular;  Laterality: N/A;  . COLONOSCOPY WITH PROPOFOL Left  10/10/2016   Procedure: COLONOSCOPY WITH PROPOFOL;  Surgeon: Arta Silence, MD;  [facility] END[facility]COPY;  Service: Endoscopy;  Laterality: Left;  . INTRAOPERATIVE TRANSESOPHAGEAL ECHOCARDIOGRAM N/A 09/27/2013   Procedure: INTRAOPERATIVE TRANSESOPHAGEAL ECHOCARDIOGRAM;  Surgeon: [name], MD;  Location: [facility];  Service: Open Heart Surgery;  Laterality: N/A;  . LEFT AND RIGHT HEART CATHETERIZATION WITH CORONARY ANGIOGRAM N/A 09/05/2013   Procedure: LEFT AND RIGHT HEART CATHETERIZATION WITH CORONARY ANGIOGRAM;  Surgeon: [name], MD;  Location: [facility] CATH LAB;  Service: Cardiovascular;  Laterality: N/A;  . MAZE N/A 09/27/2013   Procedure: MAZE;  Surgeon: [name], MD;  Location: [facility];  Service: Open Heart Surgery;  Laterality: N/A;  . NO PAST SURGERIES    . TEE WITHOUT CARDIOVERSION N/A 08/01/2013   Procedure: TRANSESOPHAGEAL ECHOCARDIOGRAM (TEE);  Surgeon: [name], MD;  Location: [facility];  Service: Cardiovascular;  Laterality: N/A;        Home Medications    Prior to Admission medications   Medication Sig Start Date End Date Taking? Authorizing Provider  aspirin EC 81 MG tablet Take 1 tablet (81 mg total) by mouth daily. 10/27/13  Yes [name], MD  carvedilol (COREG) 12.5 MG tablet TAKE 1 TABLET BY MOUTH TWICE DAILY Patient taking differently: Take 12.5 mg by mouth 2 (two) times daily with a meal.  07/20/18  Yes [name], MD  losartan (COZAAR) 50 MG tablet Take 1.5 tablets (75 mg total) by mouth 2 (two) times daily. 07/22/18  Yes [name], MD  spironolactone (ALDACTONE) 25 MG tablet TAKE 1 TABLET(25 MG) BY MOUTH DAILY Patient taking differently: Take 25 mg by mouth daily.  06/07/18  Yes [name], MD  warfarin (COUMADIN) 5 MG tablet TAKE AS DIRECTED BY COUMADIN CLINIC Patient taking differently: Take 5 mg by mouth daily.  10/08/18  Yes [name], MD  docusate sodium (COLACE) 100 MG capsule Take 1 capsule (100 mg total) by mouth 2  (two) times daily. Patient not taking: Reported on 10/13/2018 11/07/16   [name], MD  hydrocortisone (ANUSOL-HC) 2.5 % rectal cream Place 1 application rectally 2 (two) times daily. Patient not taking: Reported on 10/13/2018 11/07/16   [name], MD    Family History Family History  Problem Relation Age of Onset  . Heart failure Father   . Coronary artery disease Father        CABG    Social History Social History   Tobacco Use  . Smoking status: Current Every Day Smoker    Packs/day: 0.50    Years: 20.00    Pack years: 10.00    Last attempt to quit: 09/25/2009    Years since quitting: 9.0  . Smokeless tobacco: Never Used  Substance Use Topics  . Alcohol use: Yes    Comment: Bourbon Every Night   . Drug use: No     Allergies  Morphine and related   Review of Systems Review of Systems  Constitutional: Positive for activity change and diaphoresis.  Respiratory: Negative for cough and shortness of breath.   Cardiovascular: Negative for chest pain.  Gastrointestinal: Negative for nausea and vomiting.  Neurological: Negative for seizures and weakness.  Psychiatric/Behavioral: Positive for confusion.  All other systems reviewed and are negative.    Physical Exam Updated Vital Signs BP 129/88   Pulse 86   Temp 98.2 F (36.8 C) (Oral)   Resp (!) 21   Ht 5\' 8"  (1.727 m)   Wt 78.5 kg   SpO2 97%   BMI 26.30 kg/m   Physical Exam Vitals signs and nursing note reviewed.  Constitutional:      Appearance: He is well-developed.  HENT:     Head: Atraumatic.  Neck:     Musculoskeletal: Neck supple.  Cardiovascular:     Rate and Rhythm: Normal rate.  Pulmonary:     Effort: Pulmonary effort is normal.  Skin:    General: Skin is warm.  Neurological:     Mental Status: He is alert and oriented to person, place, and time.     Cranial Nerves: No cranial nerve deficit.     Sensory: No sensory deficit.     Motor: No weakness.     Coordination:  Coordination normal.     Comments: Patient is slightly sluggish with his speech.  I do not appreciate any slurring.      ED Treatments / Results  Labs (all labs ordered are listed, but only abnormal results are displayed) Labs Reviewed  COMPREHENSIVE METABOLIC PANEL - Abnormal; Notable for the following components:      Result Value   CO2 19 (*)    All other components within normal limits  CBC WITH DIFFERENTIAL/PLATELET - Abnormal; Notable for the following components:   MCH 25.0 (*)    RDW 19.0 (*)    All other components within normal limits  PROTIME-INR - Abnormal; Notable for the following components:   Prothrombin Time 29.2 (*)    INR 2.8 (*)    All other components within normal limits  ETHANOL - Abnormal; Notable for the following components:   Alcohol, Ethyl (B) 252 (*)    All other components within normal limits  TROPONIN I    EKG EKG Interpretation  Date/Time:  Wednesday October 13 2018 01:32:30 EDT Ventricular Rate:  86 PR Interval:    QRS Duration: 111 QT Interval:  360 QTC Calculation: 431 R Axis:   -14 Text Interpretation:  Sinus rhythm Probable anterior infarct, age indeterminate Lateral leads are also involved TWI in the lateral leads are new Nonspecific ST abnormality new changes noted Confirmed by Varney Biles [phone]) on 10/13/2018 1:57:57 AM   Radiology No results found.  Procedures Procedures (including critical care time)  Medications Ordered in ED Medications - No data to display   Initial Impression / Assessment and Plan / ED Course  I have reviewed the triage vital signs and the nursing notes.  Pertinent labs & imaging results that were available during my care of the patient were reviewed by me and considered in my medical decision making (see chart for details).        Patient sent to the ER by his wife with chief complaint of slurred speech, confusion. He has multiple medical problems including A. fib, valvular disorder and is  on Coumadin.  At this time he is not slurring.  He does appear to be sluggish with  his speech.  I do not have any focal neurologic deficit.  Patient finally admits to drinking only 2 shots of vodka, however I think he might have ingested more alcohol.  I informed her that with his medical history and with his wife reporting slurred speech and confusion that has now resolved, TIA is in differential.  The other possibility is alcohol intoxication.  Patient wanted to leave AMA, however agreed to stay and wait for the lab work-up to come back.  In the interim, RN had informed the patient's wife that she can come pick him up.  Patient's alcohol level is elevated -therefore now pretty sure his symptoms were secondary to his alcohol intoxication.  Final Clinical Impressions(s) / ED Diagnoses   Final diagnoses:  Alcoholic intoxication without complication [facility])    ED Discharge Orders    None       Varney Biles, MD 10/13/18 [phone]

## 2018-10-13 NOTE — ED Triage Notes (Signed)
C/O of episode of diaphoresis at home and "feeling off." Pt states he "recently" had a heart valve replaced (5-6 years ago) and was worried it was related. Denies N/V, Pain, LOC, Weakness, Dizziness or any other symptoms.

## 2018-10-13 NOTE — ED Notes (Signed)
Pt stated he is ready to go. Lab work reviewed with provider. Pt stated he did not want his paperwork. This nurse removed the pt's IV and called pt's wife to pick him up from waiting room. Pt left without paperwork and refused discharge vital signs.

## 2018-10-13 NOTE — ED Notes (Addendum)
Wife reports pt looks "drunk" and has not been making sense over the last 20 minutes.  States he left a pot of boiling water with hotdogs on the stove and he went back to sleep.  States he has been in bed for 1 week since he lost his job 1 week ago.  Not taking showers.  States pt is slurring words.  No arm drift.  Pt denies weakness/numbness.  Wife is Anderson Malta # [phone].  States pt was combative and didn't want to come to ED because he felt he would never see her again.  Wife reports similar symptoms when hemoglobin was low.  Pt concerned that he is going to die if he comes to hospital.

## 2018-10-13 NOTE — ED Notes (Signed)
Pt stating he is wanting to leave and not wanting to wait around. EDP notified and pt decided to wait until labs resulted.

## 2018-10-13 NOTE — ED Notes (Signed)
EKG done

## 2018-10-14 ENCOUNTER — Telehealth (HOSPITAL_COMMUNITY): Payer: Self-pay | Admitting: Cardiology

## 2018-10-14 DIAGNOSIS — I5022 Chronic systolic (congestive) heart failure: Secondary | ICD-10-CM

## 2018-10-14 NOTE — Telephone Encounter (Signed)
LOSARTAN IS NOT AVAILABLE RIGHT NOW.  OK TO SWITCH TO VALSARTAN OR IRBESARTAN?  ALSO PROVIDE DOSE- THANKS

## 2018-10-14 NOTE — Telephone Encounter (Signed)
Start with valsartan 80 mg bid to replace losartan. BMET 10 days.

## 2018-10-15 MED ORDER — SPIRONOLACTONE 25 MG PO TABS: 25.0000 mg | ORAL_TABLET | Freq: Every day | ORAL | 3 refills | Status: DC

## 2018-10-15 MED ORDER — ASPIRIN EC 81 MG PO TBEC: 81.0000 mg | DELAYED_RELEASE_TABLET | Freq: Every day | ORAL | 3 refills | Status: AC

## 2018-10-15 MED ORDER — CARVEDILOL 12.5 MG PO TABS: 12.5000 mg | ORAL_TABLET | Freq: Two times a day (BID) | ORAL | 3 refills | Status: DC

## 2018-10-15 MED ORDER — VALSARTAN 80 MG PO TABS: 80.0000 mg | ORAL_TABLET | Freq: Two times a day (BID) | ORAL | 3 refills | Status: DC

## 2018-10-15 NOTE — Telephone Encounter (Signed)
Detailed message left for patient that current pharmacy is unable to fill LOSATRAN, per Dr Aundra Dubin ok to change to VALSARTAN 80 BID and recheck bmet in 10.   Patient could also switch to another pharmacy  Will follow up with patient later

## 2018-10-15 NOTE — Telephone Encounter (Signed)
Pt would like to change meds to valsartan  refills on other meds sent as well.  labs 4/6 @ 2

## 2018-10-25 ENCOUNTER — Ambulatory Visit (HOSPITAL_COMMUNITY)
Admission: RE | Admit: 2018-10-25 | Discharge: 2018-10-25 | Disposition: A | Discharge status: Home / Self Care | Payer: BLUE CROSS/BLUE SHIELD | Source: Ambulatory Visit | Attending: Cardiology | Admitting: Cardiology

## 2018-10-25 ENCOUNTER — Other Ambulatory Visit: Payer: Self-pay

## 2018-10-25 DIAGNOSIS — I5022 Chronic systolic (congestive) heart failure: Secondary | ICD-10-CM | POA: Insufficient documentation

## 2018-10-25 LAB — BASIC METABOLIC PANEL
Anion gap: 8 (ref 5–15)
BUN: 17 mg/dL (ref 6–20)
CO2: 24 mmol/L (ref 22–32)
Calcium: 9.2 mg/dL (ref 8.9–10.3)
Chloride: 103 mmol/L (ref 98–111)
Creatinine, Ser: 0.97 mg/dL (ref 0.61–1.24)
GFR calc Af Amer: >60 mL/min (ref >60)
GFR calc non Af Amer: >60 mL/min (ref >60)
Glucose, Bld: 109 mg/dL — ABNORMAL HIGH (ref 70–99)
Potassium: 4.6 mmol/L (ref 3.5–5.1)
Sodium: 135 mmol/L (ref 135–145)

## 2018-10-28 ENCOUNTER — Telehealth: Payer: Self-pay

## 2018-10-28 NOTE — Telephone Encounter (Signed)
lmom for prescreen/drive thru 

## 2018-10-28 NOTE — Telephone Encounter (Signed)

## 2018-11-21 IMAGING — DX DG CHEST 2V
2 series · 2 of 2 positions shown · non-contrast
Comparison: 10/17/2013

CLINICAL DATA: Fatigue dizziness and lightheadedness.

EXAM:
CHEST  2 VIEW

[w chest pa]
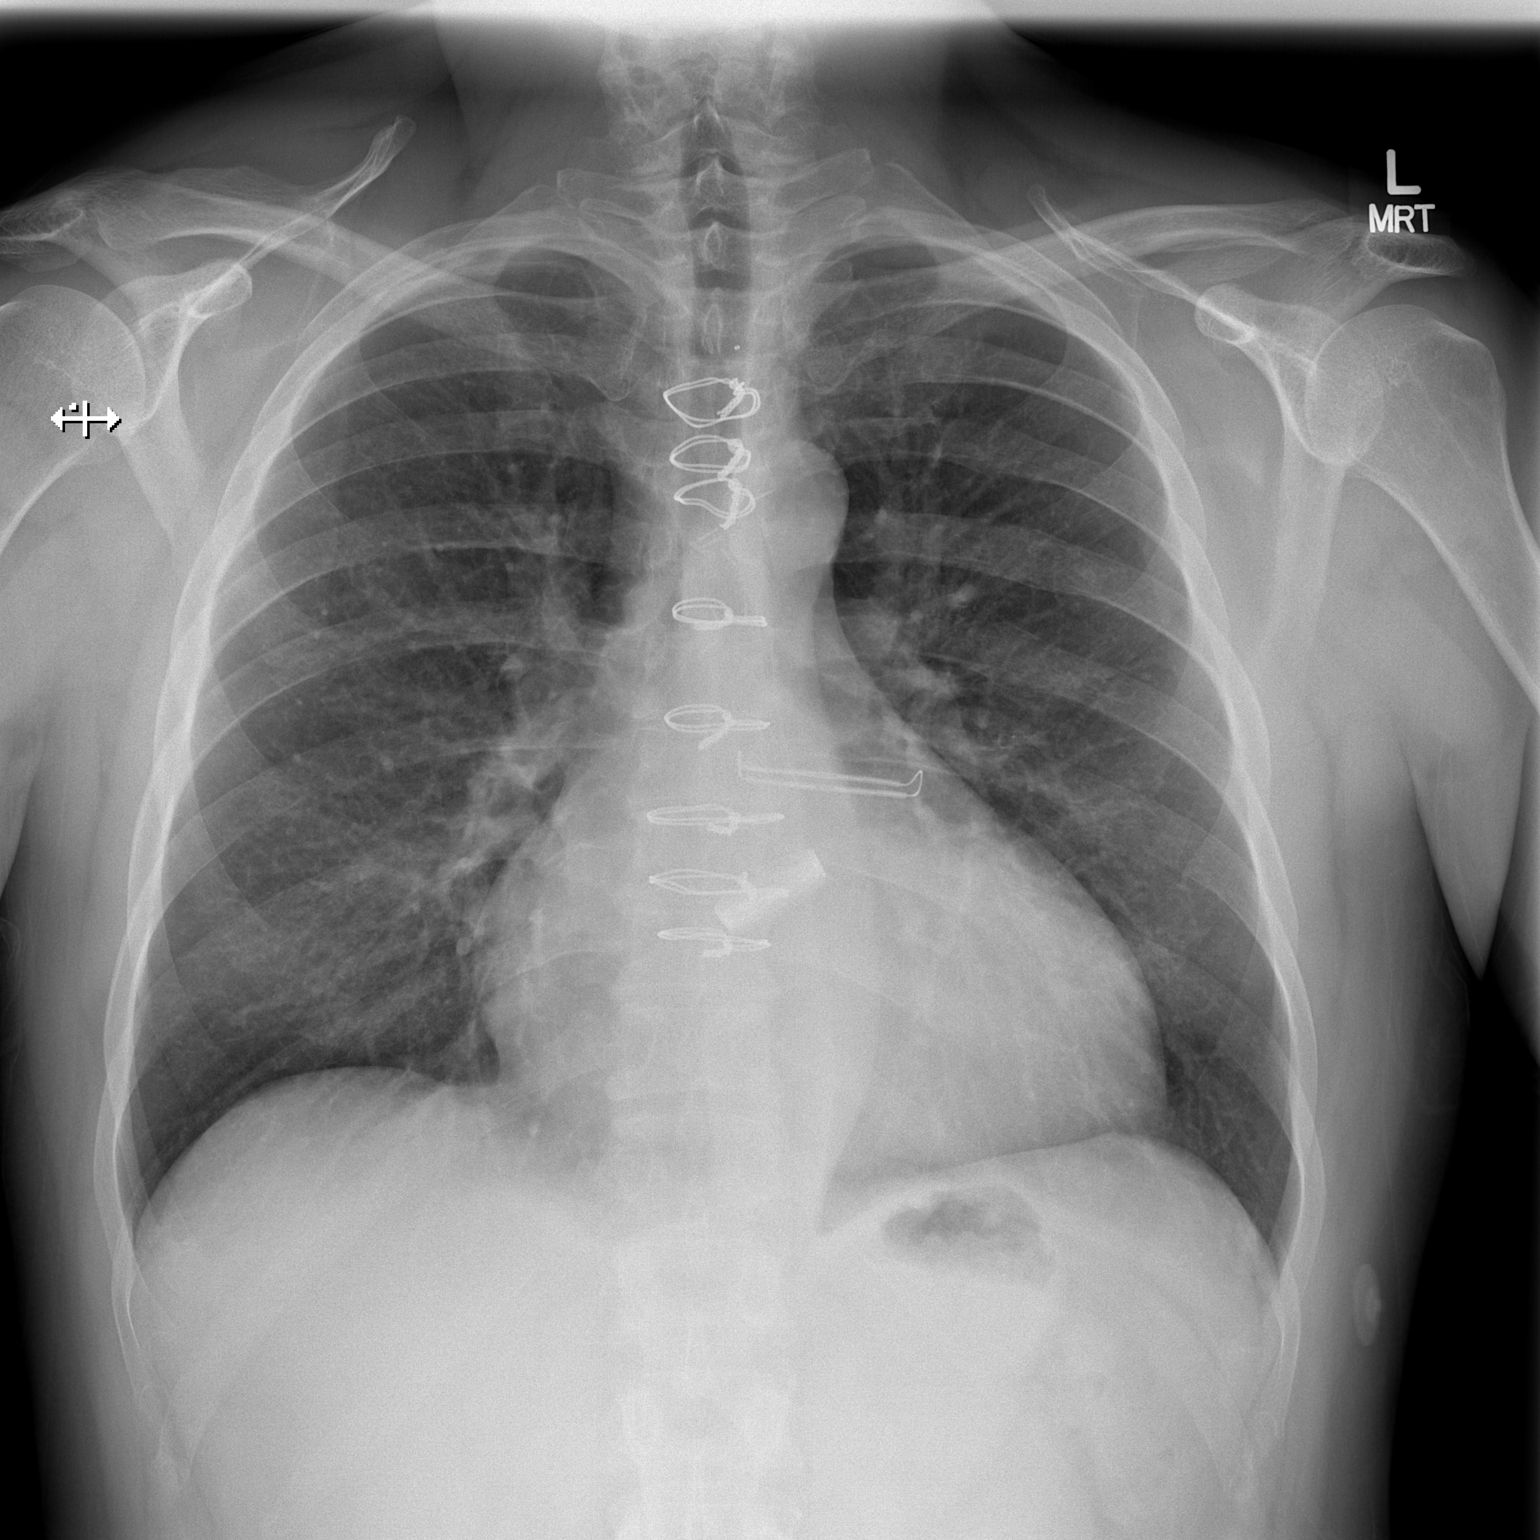

[w chest lat]
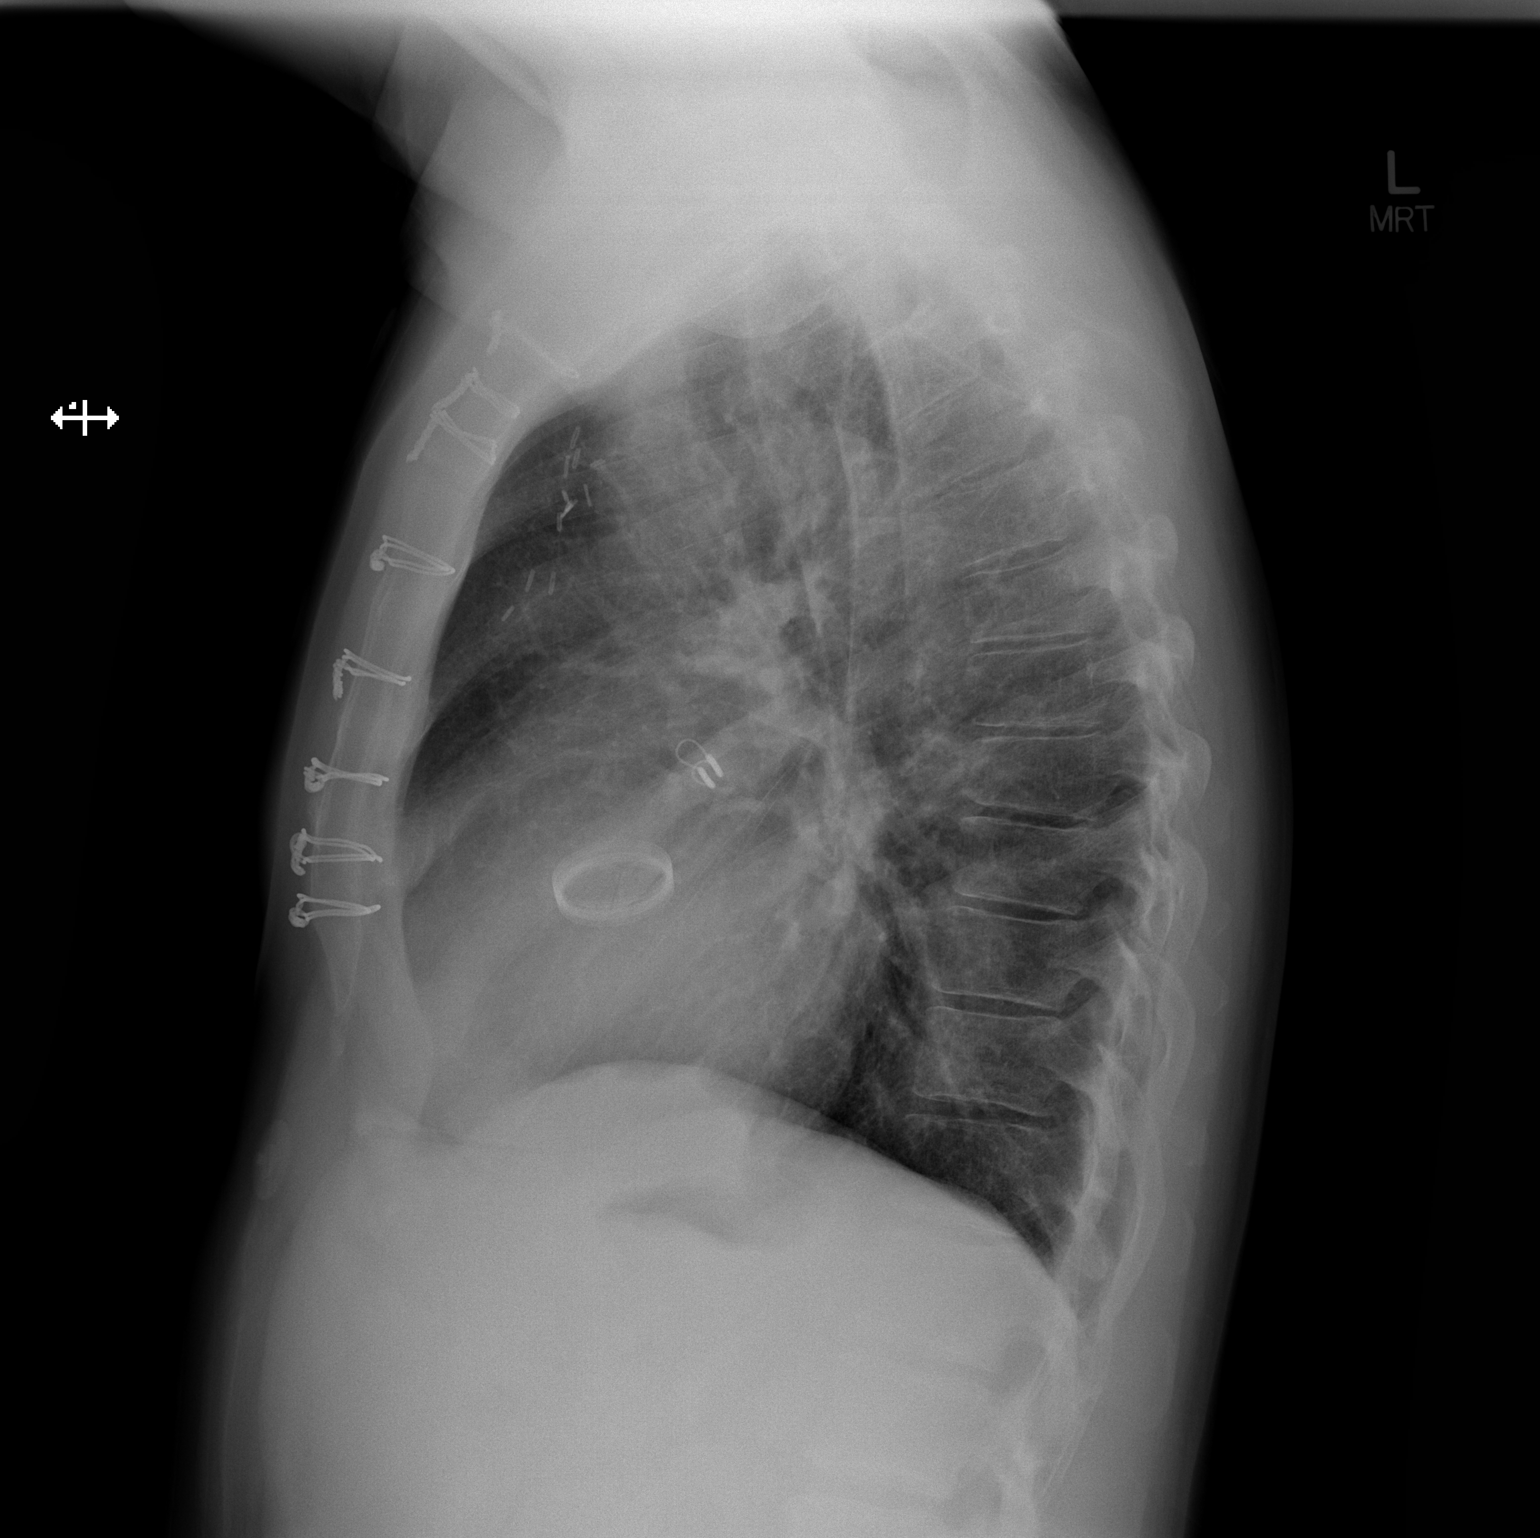

[2 of 2 positions shown; findings below may reference images not displayed]

FINDINGS: Postsurgical changes from aortic valve replacement and left atrial
appendage clamp are stable.

The cardiac silhouette is enlarged, stable. Mediastinal contours
appear intact. Tortuosity of the aorta.

There is no evidence of focal airspace consolidation, pleural
effusion or pneumothorax.

Osseous structures are without acute abnormality. Soft tissues are
grossly normal.
IMPRESSION: Enlarged cardiac silhouette which may represent cardiomegaly or
pericardial effusion.

No pulmonary edema or airspace consolidation.

## 2019-02-01 ENCOUNTER — Other Ambulatory Visit: Payer: Self-pay | Admitting: *Deleted

## 2019-02-01 NOTE — Telephone Encounter (Signed)
Prescription refill request for Coumadin received. Pt is overdue for an appointment. Left message for patient to call [facility] Clinic to schedule an appointment.

## 2019-02-04 NOTE — Telephone Encounter (Signed)
Called pt this morning and left message for pt to call [facility] Clinic back. Pt needs an appointment to refill coumadin.

## 2019-02-10 NOTE — Telephone Encounter (Signed)
Pt is overdue for an appointment. Called pt and left message for pt to call back to schedule an appointment.

## 2019-02-28 ENCOUNTER — Telehealth: Payer: Self-pay

## 2019-02-28 NOTE — Telephone Encounter (Signed)
lmom for overdue inr 

## 2019-03-10 ENCOUNTER — Telehealth: Payer: Self-pay | Admitting: *Deleted

## 2019-03-10 NOTE — Telephone Encounter (Addendum)
Prescription refill request received for Coumadin. Pt is over due for an INR check. Called pt a left him a message to call [facility]in Clinic back. Pt has a visit scheduled for tomorrow at the [facility]madin Clinic. Will refill medication when INR is checked.

## 2019-03-11 ENCOUNTER — Ambulatory Visit (INDEPENDENT_AMBULATORY_CARE_PROVIDER_SITE_OTHER): Payer: BC Managed Care – PPO | Admitting: *Deleted

## 2019-03-11 ENCOUNTER — Other Ambulatory Visit: Payer: Self-pay

## 2019-03-11 DIAGNOSIS — I480 Paroxysmal atrial fibrillation: Secondary | ICD-10-CM

## 2019-03-11 DIAGNOSIS — Z9889 Other specified postprocedural states: Secondary | ICD-10-CM | POA: Diagnosis not present

## 2019-03-11 DIAGNOSIS — Z8679 Personal history of other diseases of the circulatory system: Secondary | ICD-10-CM

## 2019-03-11 DIAGNOSIS — I351 Nonrheumatic aortic (valve) insufficiency: Secondary | ICD-10-CM

## 2019-03-11 DIAGNOSIS — Z5181 Encounter for therapeutic drug level monitoring: Secondary | ICD-10-CM

## 2019-03-11 DIAGNOSIS — Z954 Presence of other heart-valve replacement: Secondary | ICD-10-CM

## 2019-03-11 DIAGNOSIS — I359 Nonrheumatic aortic valve disorder, unspecified: Secondary | ICD-10-CM | POA: Diagnosis not present

## 2019-03-11 LAB — POCT INR: INR: 3.6 — AB (ref 2.0–3.0)

## 2019-03-11 MED ORDER — WARFARIN SODIUM 5 MG PO TABS: ORAL_TABLET | ORAL | 0 refills | Status: DC

## 2019-03-11 NOTE — Patient Instructions (Signed)
Description    Take 1/2 a tablet tomorrow, then continue taking 5mg  daily. Recheck INR in 3 weeks. Call with any changes [facility] [phone]. 2 servings of dark leafy greens a week

## 2019-04-01 ENCOUNTER — Ambulatory Visit (INDEPENDENT_AMBULATORY_CARE_PROVIDER_SITE_OTHER): Payer: BC Managed Care – PPO | Admitting: *Deleted

## 2019-04-01 ENCOUNTER — Other Ambulatory Visit: Payer: Self-pay

## 2019-04-01 DIAGNOSIS — I351 Nonrheumatic aortic (valve) insufficiency: Secondary | ICD-10-CM | POA: Diagnosis not present

## 2019-04-01 DIAGNOSIS — Z8679 Personal history of other diseases of the circulatory system: Secondary | ICD-10-CM | POA: Diagnosis not present

## 2019-04-01 DIAGNOSIS — I480 Paroxysmal atrial fibrillation: Secondary | ICD-10-CM

## 2019-04-01 DIAGNOSIS — I359 Nonrheumatic aortic valve disorder, unspecified: Secondary | ICD-10-CM

## 2019-04-01 DIAGNOSIS — Z5181 Encounter for therapeutic drug level monitoring: Secondary | ICD-10-CM | POA: Diagnosis not present

## 2019-04-01 DIAGNOSIS — Z9889 Other specified postprocedural states: Secondary | ICD-10-CM | POA: Diagnosis not present

## 2019-04-01 DIAGNOSIS — Z954 Presence of other heart-valve replacement: Secondary | ICD-10-CM

## 2019-04-01 LAB — POCT INR: INR: 2.3 (ref 2.0–3.0)

## 2019-04-01 NOTE — Patient Instructions (Addendum)
Description   Take an extra 1/2 tablet tomorrow, then continue taking 5mg  daily. Recheck INR in 3 weeks. Call with any changes [facility] [phone].

## 2019-04-19 ENCOUNTER — Other Ambulatory Visit (HOSPITAL_COMMUNITY): Payer: Self-pay

## 2019-04-25 ENCOUNTER — Other Ambulatory Visit (HOSPITAL_COMMUNITY): Payer: Self-pay | Admitting: *Deleted

## 2019-04-25 MED ORDER — WARFARIN SODIUM 5 MG PO TABS: ORAL_TABLET | ORAL | 0 refills | Status: DC

## 2019-04-26 ENCOUNTER — Other Ambulatory Visit: Payer: Self-pay

## 2019-04-26 ENCOUNTER — Ambulatory Visit (INDEPENDENT_AMBULATORY_CARE_PROVIDER_SITE_OTHER): Payer: BC Managed Care – PPO | Admitting: *Deleted

## 2019-04-26 DIAGNOSIS — I359 Nonrheumatic aortic valve disorder, unspecified: Secondary | ICD-10-CM

## 2019-04-26 DIAGNOSIS — Z9889 Other specified postprocedural states: Secondary | ICD-10-CM

## 2019-04-26 DIAGNOSIS — I351 Nonrheumatic aortic (valve) insufficiency: Secondary | ICD-10-CM

## 2019-04-26 DIAGNOSIS — I480 Paroxysmal atrial fibrillation: Secondary | ICD-10-CM | POA: Diagnosis not present

## 2019-04-26 DIAGNOSIS — Z5181 Encounter for therapeutic drug level monitoring: Secondary | ICD-10-CM | POA: Diagnosis not present

## 2019-04-26 DIAGNOSIS — Z8679 Personal history of other diseases of the circulatory system: Secondary | ICD-10-CM

## 2019-04-26 DIAGNOSIS — Z954 Presence of other heart-valve replacement: Secondary | ICD-10-CM

## 2019-04-26 LAB — POCT INR: INR: 1.8 — AB (ref 2.0–3.0)

## 2019-04-26 NOTE — Patient Instructions (Signed)
Description   Since you took 7.5 mg yesterday, take 7.5 mg today then continue taking 5mg  daily. Recheck INR in 3 weeks. Call with any changes [facility] Clinic [phone].

## 2019-05-11 ENCOUNTER — Other Ambulatory Visit: Payer: Self-pay

## 2019-05-11 ENCOUNTER — Ambulatory Visit (INDEPENDENT_AMBULATORY_CARE_PROVIDER_SITE_OTHER): Payer: BC Managed Care – PPO

## 2019-05-11 DIAGNOSIS — I359 Nonrheumatic aortic valve disorder, unspecified: Secondary | ICD-10-CM

## 2019-05-11 DIAGNOSIS — Z9889 Other specified postprocedural states: Secondary | ICD-10-CM | POA: Diagnosis not present

## 2019-05-11 DIAGNOSIS — I480 Paroxysmal atrial fibrillation: Secondary | ICD-10-CM

## 2019-05-11 DIAGNOSIS — Z5181 Encounter for therapeutic drug level monitoring: Secondary | ICD-10-CM

## 2019-05-11 DIAGNOSIS — Z8679 Personal history of other diseases of the circulatory system: Secondary | ICD-10-CM

## 2019-05-11 DIAGNOSIS — I351 Nonrheumatic aortic (valve) insufficiency: Secondary | ICD-10-CM | POA: Diagnosis not present

## 2019-05-11 DIAGNOSIS — Z954 Presence of other heart-valve replacement: Secondary | ICD-10-CM

## 2019-05-11 LAB — POCT INR: INR: 2.4 (ref 2.0–3.0)

## 2019-05-11 NOTE — Patient Instructions (Signed)
Description   Start taking 5mg  daily except 7.5mg  on Thursdays. Recheck INR in 3 weeks. Call with any changes [facility] [phone].

## 2019-06-01 ENCOUNTER — Other Ambulatory Visit: Payer: Self-pay

## 2019-06-01 ENCOUNTER — Ambulatory Visit (INDEPENDENT_AMBULATORY_CARE_PROVIDER_SITE_OTHER): Payer: BC Managed Care – PPO | Admitting: *Deleted

## 2019-06-01 DIAGNOSIS — Z5181 Encounter for therapeutic drug level monitoring: Secondary | ICD-10-CM | POA: Diagnosis not present

## 2019-06-01 DIAGNOSIS — Z8679 Personal history of other diseases of the circulatory system: Secondary | ICD-10-CM | POA: Diagnosis not present

## 2019-06-01 DIAGNOSIS — I480 Paroxysmal atrial fibrillation: Secondary | ICD-10-CM

## 2019-06-01 DIAGNOSIS — I351 Nonrheumatic aortic (valve) insufficiency: Secondary | ICD-10-CM | POA: Diagnosis not present

## 2019-06-01 DIAGNOSIS — I359 Nonrheumatic aortic valve disorder, unspecified: Secondary | ICD-10-CM

## 2019-06-01 DIAGNOSIS — Z9889 Other specified postprocedural states: Secondary | ICD-10-CM | POA: Diagnosis not present

## 2019-06-01 DIAGNOSIS — Z954 Presence of other heart-valve replacement: Secondary | ICD-10-CM

## 2019-06-01 LAB — POCT INR: INR: 5.1 — AB (ref 2.0–3.0)

## 2019-06-01 MED ORDER — WARFARIN SODIUM 5 MG PO TABS: ORAL_TABLET | ORAL | 1 refills | Status: DC

## 2019-06-01 NOTE — Patient Instructions (Addendum)
Description   Hold coumadin 11/12 and 11/13  then start taking 1 tablet daily. Coumadin Clinic [phone].

## 2019-06-08 ENCOUNTER — Telehealth: Payer: BC Managed Care – PPO | Admitting: Nurse Practitioner

## 2019-06-08 DIAGNOSIS — R059 Cough, unspecified: Secondary | ICD-10-CM

## 2019-06-08 DIAGNOSIS — Z20822 Contact with and (suspected) exposure to covid-19: Secondary | ICD-10-CM

## 2019-06-08 DIAGNOSIS — Z20828 Contact with and (suspected) exposure to other viral communicable diseases: Secondary | ICD-10-CM

## 2019-06-08 DIAGNOSIS — R05 Cough: Secondary | ICD-10-CM

## 2019-06-08 MED ORDER — BENZONATATE 100 MG PO CAPS: 100.0000 mg | ORAL_CAPSULE | Freq: Three times a day (TID) | ORAL | 0 refills | Status: DC | PRN

## 2019-06-08 NOTE — Progress Notes (Signed)
E-Visit for Corona Virus Screening   Your current symptoms could be consistent with the coronavirus.  Many health care providers can now test patients at their office but not all are.  [facility] has multiple testing sites. For information on our COVID testing locations and hours go to [email or web address]  Please quarantine yourself while awaiting your test results.  We are enrolling you in our MyChart Home [facility] for COVID19 . Daily you will receive a questionnaire within the MyChart website. Our COVID 19 response team willl be monitoriing your responses daily. Please continue good preventive care measures, including:  frequent hand-washing, avoid touching your face, cover coughs/sneezes, stay out of crowds and keep a 6 foot distance from others.    COVID-19 is a respiratory illness with symptoms that are similar to the flu. Symptoms are typically mild to moderate, but there have been cases of severe illness and death due to the virus. The following symptoms may appear 2-14 days after exposure: . Fever . Cough . Shortness of breath or difficulty breathing . Chills . Repeated shaking with chills . Muscle pain . Headache . Sore throat . New loss of taste or smell . Fatigue . Congestion or runny nose . Nausea or vomiting . Diarrhea  If you develop fever/cough/breathlessness, please stay home for 10 days with improving symptoms and until you have had 24 hours of no fever (without taking a fever reducer).  Go to the nearest hospital ED for assessment if fever/cough/breathlessness are severe or illness seems like a threat to life.  It is vitally important that if you feel that you have an infection such as this virus or any other virus that you stay home and away from places where you may spread it to others.  You should avoid contact with people age 65 and older.   You should wear a mask or cloth face covering over your nose and mouth if you must be around other  people or animals, including pets (even at home). Try to stay at least 6 feet away from other people. This will protect the people around you.  You can use medication such as A prescription cough medication called Tessalon Perles 100 mg. You may take 1-2 capsules every 8 hours as needed for cough  You may also take acetaminophen (Tylenol) as needed for fever.   Reduce your risk of any infection by using the same precautions used for avoiding the common cold or flu:  . Wash your hands often with soap and warm water for at least 20 seconds.  If soap and water are not readily available, use an alcohol-based hand sanitizer with at least 60% alcohol.  . If coughing or sneezing, cover your mouth and nose by coughing or sneezing into the elbow areas of your shirt or coat, into a tissue or into your sleeve (not your hands). . Avoid shaking hands with others and consider head nods or verbal greetings only. . Avoid touching your eyes, nose, or mouth with unwashed hands.  . Avoid close contact with people who are sick. . Avoid places or events with large numbers of people in one location, like concerts or sporting events. . Carefully consider travel plans you have or are making. . If you are planning any travel outside or inside the [place], visit the CDC's Travelers' Health webpage for the latest health notices. . If you have some symptoms but not all symptoms, continue to monitor at home and seek medical attention if your symptoms worsen. . If   you are having a medical emergency, call 911.  HOME CARE . Only take medications as instructed by your medical team. . Drink plenty of fluids and get plenty of rest. . A steam or ultrasonic humidifier can help if you have congestion.   GET HELP RIGHT AWAY IF YOU HAVE EMERGENCY WARNING SIGNS** FOR COVID-19. If you or someone is showing any of these signs seek emergency medical care immediately. Call 911 or proceed to your closest emergency facility if: . You develop  worsening high fever. . Trouble breathing . Bluish lips or face . Persistent pain or pressure in the chest . New confusion . Inability to wake or stay awake . You cough up blood. . Your symptoms become more severe  **This list is not all possible symptoms. Contact your medical provider for any symptoms that are sever or concerning to you.   MAKE SURE YOU   Understand these instructions.  Will watch your condition.  Will get help right away if you are not doing well or get worse.  Your e-visit answers were reviewed by a board certified advanced clinical practitioner to complete your personal care plan.  Depending on the condition, your plan could have included both over the counter or prescription medications.  If there is a problem please reply once you have received a response from your provider.  Your safety is important to us.  If you have drug allergies check your prescription carefully.    You can use MyChart to ask questions about today's visit, request a non-urgent call back, or ask for a work or school excuse for 24 hours related to this e-Visit. If it has been greater than 24 hours you will need to follow up with your provider, or enter a new e-Visit to address those concerns. You will get an e-mail in the next two days asking about your experience.  I hope that your e-visit has been valuable and will speed your recovery. Thank you for using e-visits.   5-10 minutes spent reviewing and documenting in chart.  

## 2019-06-09 ENCOUNTER — Ambulatory Visit (INDEPENDENT_AMBULATORY_CARE_PROVIDER_SITE_OTHER): Payer: BC Managed Care – PPO | Admitting: *Deleted

## 2019-06-09 ENCOUNTER — Other Ambulatory Visit: Payer: Self-pay

## 2019-06-09 DIAGNOSIS — I359 Nonrheumatic aortic valve disorder, unspecified: Secondary | ICD-10-CM

## 2019-06-09 DIAGNOSIS — Z5181 Encounter for therapeutic drug level monitoring: Secondary | ICD-10-CM | POA: Diagnosis not present

## 2019-06-09 DIAGNOSIS — I351 Nonrheumatic aortic (valve) insufficiency: Secondary | ICD-10-CM

## 2019-06-09 DIAGNOSIS — Z9889 Other specified postprocedural states: Secondary | ICD-10-CM | POA: Diagnosis not present

## 2019-06-09 DIAGNOSIS — Z954 Presence of other heart-valve replacement: Secondary | ICD-10-CM

## 2019-06-09 DIAGNOSIS — Z8679 Personal history of other diseases of the circulatory system: Secondary | ICD-10-CM

## 2019-06-09 DIAGNOSIS — I480 Paroxysmal atrial fibrillation: Secondary | ICD-10-CM

## 2019-06-09 LAB — POCT INR: INR: 2.8 (ref 2.0–3.0)

## 2019-06-09 NOTE — Patient Instructions (Addendum)
Description   Continue taking 1 tablet daily. Recheck INR in 2 weeks. Coumadin Clinic [phone], call with any changes in medication or up coming procedure.

## 2019-06-14 ENCOUNTER — Telehealth: Payer: BC Managed Care – PPO | Admitting: Physician Assistant

## 2019-06-14 DIAGNOSIS — Z20828 Contact with and (suspected) exposure to other viral communicable diseases: Secondary | ICD-10-CM

## 2019-06-14 DIAGNOSIS — Z20822 Contact with and (suspected) exposure to covid-19: Secondary | ICD-10-CM

## 2019-06-14 NOTE — Progress Notes (Signed)
E-Visit for [employer] Virus Screening  Many health care providers can now test patients at their office but not all are.  [facility] has multiple testing sites. For information on our COVID testing locations and hours go to [email or web address]  Please quarantine yourself while awaiting your test results.  We are enrolling you in our [place] for [place] . Daily you will receive a questionnaire within the [facility] website. Our COVID 19 response team willl be monitoriing your responses daily. Please continue good preventive care measures, including:  frequent hand-washing, avoid touching your face, cover coughs/sneezes, stay out of crowds and keep a 6 foot distance from others.    COVID-19 is a respiratory illness with symptoms that are similar to the flu. Symptoms are typically mild to moderate, but there have been cases of severe illness and death due to the virus. The following symptoms may appear 2-14 days after exposure: . Fever . Cough . Shortness of breath or difficulty breathing . Chills . Repeated shaking with chills . Muscle pain . Headache . Sore throat . New loss of taste or smell . Fatigue . Congestion or runny nose . Nausea or vomiting . Diarrhea  If you develop fever/cough/breathlessness, please stay home for 10 days with improving symptoms and until you have had 24 hours of no fever (without taking a fever reducer).  Go to the nearest hospital ED for assessment if fever/cough/breathlessness are severe or illness seems like a threat to life.  It is vitally important that if you feel that you have an infection such as this virus or any other virus that you stay home and away from places where you may spread it to others.  You should avoid contact with people age 46 and older.   You should wear a mask or cloth face covering over your nose and mouth if you must be around other people or animals, including pets (even at home). Try to stay at  least 6 feet away from other people. This will protect the people around you.    You may also take acetaminophen (Tylenol) as needed for fever.   Reduce your risk of any infection by using the same precautions used for avoiding the common cold or flu:  [name] Wash your hands often with soap and warm water for at least 20 seconds.  If soap and water are not readily available, use an alcohol-based hand sanitizer with at least 60% alcohol.  . If coughing or sneezing, cover your mouth and nose by coughing or sneezing into the elbow areas of your shirt or coat, into a tissue or into your sleeve (not your hands). . Avoid shaking hands with others and consider head nods or verbal greetings only. . Avoid touching your eyes, nose, or mouth with unwashed hands.  . Avoid close contact with people who are sick. . Avoid places or events with large numbers of people in one location, like concerts or sporting events. . Carefully consider travel plans you have or are making. . If you are planning any travel outside or inside the [place], visit the CDC's Travelers' Health webpage for the latest health notices. . If you have some symptoms but not all symptoms, continue to monitor at home and seek medical attention if your symptoms worsen. . If you are having a medical emergency, call 911.  HOME CARE . Only take medications as instructed by your medical team. . Drink plenty of fluids and get plenty of rest. . A steam or ultrasonic humidifier  can help if you have congestion.   GET HELP RIGHT AWAY IF YOU HAVE EMERGENCY WARNING SIGNS** FOR COVID-19. If you or someone is showing any of these signs seek emergency medical care immediately. Call 911 or proceed to your closest emergency facility if: . You develop worsening high fever. . Trouble breathing . Bluish lips or face . Persistent pain or pressure in the chest . New confusion . Inability to wake or stay awake . You cough up blood. . Your symptoms become more  severe  **This list is not all possible symptoms. Contact your medical provider for any symptoms that are sever or concerning to you.   MAKE SURE YOU   Understand these instructions.  Will watch your condition.  Will get help right away if you are not doing well or get worse.  Your e-visit answers were reviewed by a board certified advanced clinical practitioner to complete your personal care plan.  Depending on the condition, your plan could have included both over the counter or prescription medications.  If there is a problem please reply once you have received a response from your provider.  Your safety is important to [place].  If you have drug allergies check your prescription carefully.    You can use MyChart to ask questions about today's visit, request a non-urgent call back, or ask for a work or school excuse for 24 hours related to this e-Visit. If it has been greater than 24 hours you will need to follow up with your provider, or enter a new e-Visit to address those concerns. You will get an e-mail in the next two days asking about your experience.  I hope that your e-visit has been valuable and will speed your recovery. Thank you for using e-visits.  Greater than 5 minutes, yet less than 10 minutes of time have been spent researching, coordinating, and implementing care for this patient today

## 2019-06-23 ENCOUNTER — Ambulatory Visit (INDEPENDENT_AMBULATORY_CARE_PROVIDER_SITE_OTHER): Payer: BC Managed Care – PPO

## 2019-06-23 ENCOUNTER — Other Ambulatory Visit: Payer: Self-pay

## 2019-06-23 DIAGNOSIS — I359 Nonrheumatic aortic valve disorder, unspecified: Secondary | ICD-10-CM

## 2019-06-23 DIAGNOSIS — Z5181 Encounter for therapeutic drug level monitoring: Secondary | ICD-10-CM

## 2019-06-23 DIAGNOSIS — I480 Paroxysmal atrial fibrillation: Secondary | ICD-10-CM

## 2019-06-23 DIAGNOSIS — I351 Nonrheumatic aortic (valve) insufficiency: Secondary | ICD-10-CM

## 2019-06-23 DIAGNOSIS — Z8679 Personal history of other diseases of the circulatory system: Secondary | ICD-10-CM | POA: Diagnosis not present

## 2019-06-23 DIAGNOSIS — Z9889 Other specified postprocedural states: Secondary | ICD-10-CM

## 2019-06-23 DIAGNOSIS — Z954 Presence of other heart-valve replacement: Secondary | ICD-10-CM

## 2019-06-23 LAB — POCT INR: INR: 3.1 — AB (ref 2.0–3.0)

## 2019-06-23 NOTE — Patient Instructions (Signed)
Description   Continue taking 1 tablet daily. Recheck INR in 4 weeks. Coumadin Clinic [phone], call with any changes in medication or up coming procedure.

## 2019-07-27 ENCOUNTER — Ambulatory Visit (INDEPENDENT_AMBULATORY_CARE_PROVIDER_SITE_OTHER): Payer: BC Managed Care – PPO | Admitting: *Deleted

## 2019-07-27 ENCOUNTER — Other Ambulatory Visit: Payer: Self-pay

## 2019-07-27 DIAGNOSIS — I359 Nonrheumatic aortic valve disorder, unspecified: Secondary | ICD-10-CM

## 2019-07-27 DIAGNOSIS — Z5181 Encounter for therapeutic drug level monitoring: Secondary | ICD-10-CM | POA: Diagnosis not present

## 2019-07-27 DIAGNOSIS — I480 Paroxysmal atrial fibrillation: Secondary | ICD-10-CM

## 2019-07-27 DIAGNOSIS — Z954 Presence of other heart-valve replacement: Secondary | ICD-10-CM

## 2019-07-27 DIAGNOSIS — Z8679 Personal history of other diseases of the circulatory system: Secondary | ICD-10-CM | POA: Diagnosis not present

## 2019-07-27 DIAGNOSIS — I351 Nonrheumatic aortic (valve) insufficiency: Secondary | ICD-10-CM

## 2019-07-27 DIAGNOSIS — Z9889 Other specified postprocedural states: Secondary | ICD-10-CM

## 2019-07-27 LAB — POCT INR: INR: 3.2 — AB (ref 2.0–3.0)

## 2019-07-27 MED ORDER — WARFARIN SODIUM 5 MG PO TABS: ORAL_TABLET | ORAL | 1 refills | Status: DC

## 2019-07-27 NOTE — Patient Instructions (Signed)
Description   Continue taking 1 tablet daily. Recheck INR in 5 weeks. Coumadin Clinic [phone], call with any changes in medication or up coming procedure.

## 2019-09-29 ENCOUNTER — Ambulatory Visit: Payer: BC Managed Care – PPO | Attending: Internal Medicine

## 2019-09-29 ENCOUNTER — Ambulatory Visit (INDEPENDENT_AMBULATORY_CARE_PROVIDER_SITE_OTHER): Payer: BC Managed Care – PPO | Admitting: *Deleted

## 2019-09-29 ENCOUNTER — Other Ambulatory Visit: Payer: Self-pay

## 2019-09-29 DIAGNOSIS — Z954 Presence of other heart-valve replacement: Secondary | ICD-10-CM

## 2019-09-29 DIAGNOSIS — Z9889 Other specified postprocedural states: Secondary | ICD-10-CM | POA: Diagnosis not present

## 2019-09-29 DIAGNOSIS — Z8679 Personal history of other diseases of the circulatory system: Secondary | ICD-10-CM | POA: Diagnosis not present

## 2019-09-29 DIAGNOSIS — I480 Paroxysmal atrial fibrillation: Secondary | ICD-10-CM | POA: Diagnosis not present

## 2019-09-29 DIAGNOSIS — I359 Nonrheumatic aortic valve disorder, unspecified: Secondary | ICD-10-CM | POA: Diagnosis not present

## 2019-09-29 DIAGNOSIS — Z5181 Encounter for therapeutic drug level monitoring: Secondary | ICD-10-CM

## 2019-09-29 DIAGNOSIS — I351 Nonrheumatic aortic (valve) insufficiency: Secondary | ICD-10-CM

## 2019-09-29 DIAGNOSIS — Z23 Encounter for immunization: Secondary | ICD-10-CM

## 2019-09-29 LAB — POCT INR: INR: 4.5 — AB (ref 2.0–3.0)

## 2019-09-29 NOTE — Patient Instructions (Signed)
Description   Hold tomorrow and take 1/2 a tablet on Saturday, then continue taking 1 tablet daily. Recheck INR in 3 weeks. Coumadin Clinic [phone], call with any changes in medication or up coming procedure.

## 2019-09-29 NOTE — Progress Notes (Signed)
   Covid-19 Vaccination Clinic  Name:  [name] Hinton    MRN: [id number] DOB: 1971/06/02  09/29/2019  [name] was observed post Covid-19 immunization for 15 minutes without incident. He was provided with Vaccine Information Sheet and instruction to access the V-Safe system.   [name] was instructed to call 911 with any severe reactions post vaccine: [name] Difficulty breathing  . Swelling of face and throat  . A fast heartbeat  . A bad rash all over body  . Dizziness and weakness   Immunizations Administered    Name Date Dose VIS Date Route   Pfizer COVID-19 Vaccine 09/29/2019  8:27 AM 0.3 mL 07/01/2019 Intramuscular   Manufacturer: [place]   Lot: [id number]   [place]: [id number]

## 2019-10-24 ENCOUNTER — Ambulatory Visit: Payer: BC Managed Care – PPO | Attending: Internal Medicine

## 2019-10-24 DIAGNOSIS — Z23 Encounter for immunization: Secondary | ICD-10-CM

## 2019-10-24 NOTE — Progress Notes (Signed)
   Covid-19 Vaccination Clinic  Name:  [name] Hinton    MRN: [id number] DOB: 1971/01/20  10/24/2019  [name] was observed post Covid-19 immunization for 15 minutes without incident. He was provided with Vaccine Information Sheet and instruction to access the V-Safe system.   [name] was instructed to call 911 with any severe reactions post vaccine: [name] Difficulty breathing  . Swelling of face and throat  . A fast heartbeat  . A bad rash all over body  . Dizziness and weakness   Immunizations Administered    Name Date Dose VIS Date Route   Pfizer COVID-19 Vaccine 10/24/2019  3:25 PM 0.3 mL 07/01/2019 Intramuscular   Manufacturer: [place]   Lot: [id number]   [place]: [id number]

## 2019-10-26 ENCOUNTER — Other Ambulatory Visit: Payer: Self-pay

## 2019-10-27 NOTE — Telephone Encounter (Signed)
Left message again to r/s appt.

## 2019-11-02 NOTE — Telephone Encounter (Signed)
Pt returned the call and he has been having transportation issues.  He made an appt for tomorrow; pt does have enough Warfarin pills and will refill Warfarin refill at appt tomorrow.

## 2019-11-02 NOTE — Telephone Encounter (Signed)
Left a message for the pt to call back to schedule an appt.

## 2019-11-03 ENCOUNTER — Other Ambulatory Visit: Payer: Self-pay

## 2019-11-03 ENCOUNTER — Ambulatory Visit (INDEPENDENT_AMBULATORY_CARE_PROVIDER_SITE_OTHER): Payer: BC Managed Care – PPO | Admitting: *Deleted

## 2019-11-03 DIAGNOSIS — I480 Paroxysmal atrial fibrillation: Secondary | ICD-10-CM

## 2019-11-03 DIAGNOSIS — I359 Nonrheumatic aortic valve disorder, unspecified: Secondary | ICD-10-CM

## 2019-11-03 DIAGNOSIS — Z9889 Other specified postprocedural states: Secondary | ICD-10-CM

## 2019-11-03 DIAGNOSIS — Z8679 Personal history of other diseases of the circulatory system: Secondary | ICD-10-CM

## 2019-11-03 DIAGNOSIS — I351 Nonrheumatic aortic (valve) insufficiency: Secondary | ICD-10-CM

## 2019-11-03 DIAGNOSIS — Z5181 Encounter for therapeutic drug level monitoring: Secondary | ICD-10-CM | POA: Diagnosis not present

## 2019-11-03 DIAGNOSIS — Z954 Presence of other heart-valve replacement: Secondary | ICD-10-CM

## 2019-11-03 LAB — POCT INR: INR: 2.7 (ref 2.0–3.0)

## 2019-11-03 MED ORDER — WARFARIN SODIUM 5 MG PO TABS: ORAL_TABLET | ORAL | 0 refills | Status: DC

## 2019-11-03 NOTE — Patient Instructions (Signed)
Description   Continue taking 1 tablet daily. Recheck INR in 4 weeks. Coumadin Clinic [phone], call with any changes in medication or up coming procedure.

## 2019-11-15 ENCOUNTER — Other Ambulatory Visit (HOSPITAL_COMMUNITY): Payer: Self-pay

## 2019-11-15 MED ORDER — VALSARTAN 80 MG PO TABS: 80.0000 mg | ORAL_TABLET | Freq: Two times a day (BID) | ORAL | 0 refills | Status: DC

## 2019-11-16 ENCOUNTER — Other Ambulatory Visit (HOSPITAL_COMMUNITY): Payer: Self-pay | Admitting: *Deleted

## 2019-11-16 MED ORDER — VALSARTAN 80 MG PO TABS: 80.0000 mg | ORAL_TABLET | Freq: Two times a day (BID) | ORAL | 0 refills | Status: DC

## 2019-11-16 MED ORDER — SPIRONOLACTONE 25 MG PO TABS: 25.0000 mg | ORAL_TABLET | Freq: Every day | ORAL | 0 refills | Status: DC

## 2019-12-01 ENCOUNTER — Other Ambulatory Visit: Payer: Self-pay | Admitting: *Deleted

## 2019-12-01 ENCOUNTER — Ambulatory Visit (INDEPENDENT_AMBULATORY_CARE_PROVIDER_SITE_OTHER): Payer: BC Managed Care – PPO | Admitting: *Deleted

## 2019-12-01 ENCOUNTER — Other Ambulatory Visit: Payer: Self-pay

## 2019-12-01 DIAGNOSIS — I480 Paroxysmal atrial fibrillation: Secondary | ICD-10-CM | POA: Diagnosis not present

## 2019-12-01 DIAGNOSIS — I351 Nonrheumatic aortic (valve) insufficiency: Secondary | ICD-10-CM

## 2019-12-01 DIAGNOSIS — Z8679 Personal history of other diseases of the circulatory system: Secondary | ICD-10-CM | POA: Diagnosis not present

## 2019-12-01 DIAGNOSIS — Z954 Presence of other heart-valve replacement: Secondary | ICD-10-CM

## 2019-12-01 DIAGNOSIS — Z9889 Other specified postprocedural states: Secondary | ICD-10-CM

## 2019-12-01 DIAGNOSIS — I359 Nonrheumatic aortic valve disorder, unspecified: Secondary | ICD-10-CM

## 2019-12-01 DIAGNOSIS — Z5181 Encounter for therapeutic drug level monitoring: Secondary | ICD-10-CM | POA: Diagnosis not present

## 2019-12-01 LAB — POCT INR: INR: 2.8 (ref 2.0–3.0)

## 2019-12-01 MED ORDER — WARFARIN SODIUM 5 MG PO TABS: ORAL_TABLET | ORAL | 0 refills | Status: DC

## 2019-12-01 NOTE — Patient Instructions (Signed)
Description   Continue taking 1 tablet daily. Recheck INR in 5 weeks. Coumadin Clinic [phone], call with any changes in medication or up coming procedure.

## 2019-12-09 ENCOUNTER — Other Ambulatory Visit (HOSPITAL_COMMUNITY): Payer: Self-pay

## 2019-12-09 MED ORDER — CARVEDILOL 12.5 MG PO TABS: 12.5000 mg | ORAL_TABLET | Freq: Two times a day (BID) | ORAL | 0 refills | Status: DC

## 2020-01-04 ENCOUNTER — Other Ambulatory Visit: Payer: Self-pay

## 2020-01-04 MED ORDER — WARFARIN SODIUM 5 MG PO TABS: ORAL_TABLET | ORAL | 0 refills | Status: DC

## 2020-01-06 ENCOUNTER — Other Ambulatory Visit: Payer: Self-pay

## 2020-01-06 ENCOUNTER — Ambulatory Visit (INDEPENDENT_AMBULATORY_CARE_PROVIDER_SITE_OTHER): Payer: BC Managed Care – PPO | Admitting: *Deleted

## 2020-01-06 DIAGNOSIS — I351 Nonrheumatic aortic (valve) insufficiency: Secondary | ICD-10-CM

## 2020-01-06 DIAGNOSIS — Z8679 Personal history of other diseases of the circulatory system: Secondary | ICD-10-CM

## 2020-01-06 DIAGNOSIS — I359 Nonrheumatic aortic valve disorder, unspecified: Secondary | ICD-10-CM

## 2020-01-06 DIAGNOSIS — Z9889 Other specified postprocedural states: Secondary | ICD-10-CM | POA: Diagnosis not present

## 2020-01-06 DIAGNOSIS — I480 Paroxysmal atrial fibrillation: Secondary | ICD-10-CM | POA: Diagnosis not present

## 2020-01-06 DIAGNOSIS — Z5181 Encounter for therapeutic drug level monitoring: Secondary | ICD-10-CM | POA: Diagnosis not present

## 2020-01-06 DIAGNOSIS — Z954 Presence of other heart-valve replacement: Secondary | ICD-10-CM

## 2020-01-06 LAB — POCT INR: INR: 2.4 (ref 2.0–3.0)

## 2020-01-06 NOTE — Patient Instructions (Signed)
Description   Take 1.5 tablets today and then continue taking 1 tablet daily. Recheck INR in 4 weeks. Coumadin Clinic [phone], call with any changes in medication or up coming procedure.

## 2020-02-03 ENCOUNTER — Other Ambulatory Visit: Payer: Self-pay

## 2020-02-03 ENCOUNTER — Ambulatory Visit (INDEPENDENT_AMBULATORY_CARE_PROVIDER_SITE_OTHER): Payer: BC Managed Care – PPO

## 2020-02-03 DIAGNOSIS — Z5181 Encounter for therapeutic drug level monitoring: Secondary | ICD-10-CM | POA: Diagnosis not present

## 2020-02-03 DIAGNOSIS — I359 Nonrheumatic aortic valve disorder, unspecified: Secondary | ICD-10-CM

## 2020-02-03 DIAGNOSIS — I351 Nonrheumatic aortic (valve) insufficiency: Secondary | ICD-10-CM

## 2020-02-03 DIAGNOSIS — Z9889 Other specified postprocedural states: Secondary | ICD-10-CM | POA: Diagnosis not present

## 2020-02-03 DIAGNOSIS — Z8679 Personal history of other diseases of the circulatory system: Secondary | ICD-10-CM

## 2020-02-03 DIAGNOSIS — I480 Paroxysmal atrial fibrillation: Secondary | ICD-10-CM

## 2020-02-03 DIAGNOSIS — Z954 Presence of other heart-valve replacement: Secondary | ICD-10-CM

## 2020-02-03 LAB — POCT INR: INR: 1.6 — AB (ref 2.0–3.0)

## 2020-02-03 NOTE — Patient Instructions (Signed)
Take  2 tablets today and then start taking 1 tablet daily except 1.5 tablets on Wednesday. Recheck INR in 3 weeks. Coumadin Clinic [phone], call with any changes in medication or up coming procedure.

## 2020-02-07 ENCOUNTER — Ambulatory Visit (HOSPITAL_COMMUNITY)
Admission: RE | Admit: 2020-02-07 | Discharge: 2020-02-07 | Disposition: A | Discharge status: Home / Self Care | Payer: BC Managed Care – PPO | Source: Ambulatory Visit | Attending: Cardiology | Admitting: Cardiology

## 2020-02-07 ENCOUNTER — Other Ambulatory Visit: Payer: Self-pay

## 2020-02-07 ENCOUNTER — Encounter (HOSPITAL_COMMUNITY): Payer: Self-pay | Admitting: Cardiology

## 2020-02-07 VITALS — BP 132/88 | HR 76 | Wt 179.0 lb

## 2020-02-07 DIAGNOSIS — Q231 Congenital insufficiency of aortic valve: Secondary | ICD-10-CM | POA: Insufficient documentation

## 2020-02-07 DIAGNOSIS — F1010 Alcohol abuse, uncomplicated: Secondary | ICD-10-CM | POA: Insufficient documentation

## 2020-02-07 DIAGNOSIS — Z7982 Long term (current) use of aspirin: Secondary | ICD-10-CM | POA: Diagnosis not present

## 2020-02-07 DIAGNOSIS — Z87891 Personal history of nicotine dependence: Secondary | ICD-10-CM | POA: Insufficient documentation

## 2020-02-07 DIAGNOSIS — Z79899 Other long term (current) drug therapy: Secondary | ICD-10-CM | POA: Diagnosis not present

## 2020-02-07 DIAGNOSIS — I428 Other cardiomyopathies: Secondary | ICD-10-CM | POA: Diagnosis not present

## 2020-02-07 DIAGNOSIS — I110 Hypertensive heart disease with heart failure: Secondary | ICD-10-CM | POA: Insufficient documentation

## 2020-02-07 DIAGNOSIS — Z8249 Family history of ischemic heart disease and other diseases of the circulatory system: Secondary | ICD-10-CM | POA: Diagnosis not present

## 2020-02-07 DIAGNOSIS — Z954 Presence of other heart-valve replacement: Secondary | ICD-10-CM | POA: Diagnosis not present

## 2020-02-07 DIAGNOSIS — Z7901 Long term (current) use of anticoagulants: Secondary | ICD-10-CM | POA: Insufficient documentation

## 2020-02-07 DIAGNOSIS — I4892 Unspecified atrial flutter: Secondary | ICD-10-CM | POA: Diagnosis not present

## 2020-02-07 DIAGNOSIS — Z952 Presence of prosthetic heart valve: Secondary | ICD-10-CM | POA: Diagnosis not present

## 2020-02-07 DIAGNOSIS — I480 Paroxysmal atrial fibrillation: Secondary | ICD-10-CM | POA: Insufficient documentation

## 2020-02-07 DIAGNOSIS — I5022 Chronic systolic (congestive) heart failure: Secondary | ICD-10-CM | POA: Diagnosis not present

## 2020-02-07 LAB — CBC
HCT: 41.8 % (ref 39.0–52.0)
Hemoglobin: 13.3 g/dL (ref 13.0–17.0)
MCH: 32.3 pg (ref 26.0–34.0)
MCHC: 31.8 g/dL (ref 30.0–36.0)
MCV: 101.5 fL — ABNORMAL HIGH (ref 80.0–100.0)
Platelets: 216 10*3/uL (ref 150–400)
RBC: 4.12 MIL/uL — ABNORMAL LOW (ref 4.22–5.81)
RDW: 17.2 % — ABNORMAL HIGH (ref 11.5–15.5)
WBC: 6.3 10*3/uL (ref 4.0–10.5)
nRBC: 0 % (ref 0.0–0.2)

## 2020-02-07 LAB — BASIC METABOLIC PANEL
Anion gap: 9 (ref 5–15)
BUN: 15 mg/dL (ref 6–20)
CO2: 23 mmol/L (ref 22–32)
Calcium: 9 mg/dL (ref 8.9–10.3)
Chloride: 106 mmol/L (ref 98–111)
Creatinine, Ser: 0.92 mg/dL (ref 0.61–1.24)
GFR calc Af Amer: >60 mL/min (ref >60)
GFR calc non Af Amer: >60 mL/min (ref >60)
Glucose, Bld: 112 mg/dL — ABNORMAL HIGH (ref 70–99)
Potassium: 4.4 mmol/L (ref 3.5–5.1)
Sodium: 138 mmol/L (ref 135–145)

## 2020-02-07 LAB — LIPID PANEL
Cholesterol: 229 mg/dL — ABNORMAL HIGH (ref 0–200)
HDL: 76 mg/dL (ref >40)
LDL Cholesterol: 146 mg/dL — ABNORMAL HIGH (ref 0–99)
Total CHOL/HDL Ratio: 3 RATIO
Triglycerides: 34 mg/dL (ref <150)
VLDL: 7 mg/dL (ref 0–40)

## 2020-02-07 NOTE — [facility] Notes (Signed)
Patient ID: [name] Hinton, male   DOB: 03-25-1971, 49 y.o.   MRN: [id number] PCP: Dr. Ashby Hinton Cardiology: Dr. Aundra Hinton  [name] Hinton is a 49 y.o.with a prior history of alcohol abuse and former smoker (quit  2011). Hospitalized 1/15 with increased dyspnea-->ECHO performed and showed EF 25%, global hypokinesis, RV mildly dilated,severe aortic insufficiency.  Patient had new-onset acute systolic CHF and atrial flutter. Started on cardizem drip and Xarelto. Initially in A flutter--> became A fib.   He had TEE and DC-CV on 08/01/13.  Dr [name] Hinton provided consultation due to ascending aortic aneurysm and bicuspid valve with severe AI. He was discharged on ACEI, beta blocker, spironolactone, and xarelto. Discharge weight 150 pounds.    Had cough with lisinopril and switched to losartan.  LHC/RHC was done 2/15 showing no coronary disease and elevated PCWP with pulmonary hypertension.   In 3/15, he had replacement of ascending aortic and aortic valve with Bentall procedure with mechanical aortic valve.  He also had Maze procedure and LAA obliteration.  He was noted to have gone into atypical atrial flutter by the time of his 3/30 followup with Dr. Roxy Hinton.  He was started on amiodarone and went back into NSR. He is back at work.  Echo in 4/15 showed normally functioning mechanical aortic valve, but EF remained 30-35%.   Echo in 4/15 showed normally functioning mechanical aortic valve, but EF remained 30-35%.  ECHO 01/2014  EF up to 45% with normal mechanical aortic valve.  Echo 9/17 with increase in EF to 55-60% with mean gradient 13 mmHg across mechanical AoV.    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  No other neurological symptoms.   In 3/18, he was admitted with lower GI bleeding/symptomatic anemia.  He received 2 units PRBCs.  Suspected to be hemorrhoidal.  He had a colonoscopy confirming hemorrhoids and showing 1 polyp that was removed.    Echo in 11/19 showed EF 55% with mild LVH,  normal RV size with mildly decreased systolic function, normal mechanical aortic valve.   Symptomatically, he has been doing quite well still.  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  Weight is stable.  No exertional dyspnea or chest pain. No BRBPR/melena.   He has been compliant with warfarin followup. No lightheadedness.  No stroke-like symptoms.   ECG (Personally reviewed): NSR, inferolateral TWIs.   Labs (1/15): K 5.2, creatinine 1.3, AST 40, ALT 58 Labs (2/15): K 4.7, creatinine 1.1 Labs (4/15): K 4.9, creatinine 1.0, LFTs normal, TSH normal Labs (5/15): K 4.6, creatinine 1.1, BNP 134 Labs (6/15): K 4.8  creatinine 1.0. INR 2.4 Labs (03/01/14): K 4.7 Creatinine 1.16  Labs (11/15): K 4.8, creatinine 0.79 Labs (3/18): K 4.4, creatinine 0.8, hgb 10.2 Labs (7/18): K 4.2, creatinine 0.86, hgb 9.8  PMH:  1. A fib/Aflutter: Initially flutter during 1/15 admission, degenerated to fibrillation --> S/P TEE DC-CV to NSR. Atypical atrial flutter recurred post-op in 3/15. 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  2. 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  TEE (1/15) with EF 25%, moderate LV dilation, bicuspid aortic valve with severe eccentric AI and no AS, ascending aorta 5.0 cm, moderately decreased RV systolic function.  CTA chest (1/15) with 5.1 cm ascending aortic aneurysm. Patient had Bentall replacement of ascending aorta with mechanical aortic valve and Maze with LAA obliteration in 3/15.  3. Cardiomyopathy with systolic CHF: As above, TEE with EF 25% and moderately dilated LV, moderately decreased  RV systolic function.  Tachy-mediated CMP versus ETOH CMP versus long-standing AI versus a combination of the three.  LHC/RHC (2/15): no coronary disease, mean RA 5, PA 62/25, mean PCWP 22, CI 1.8, PVR 5.2 WU, LVEDP 35 (severe AI).  Echo (2/15): EF 35-40%, moderately dilated LV, bicuspid aortic valve with severe AI.  Echo (4/15) with mildly  dilated LV, mild LVH, EF 30-35%, normal RV size and systolic function, mechanical aortic valve appeared to function normally, IVC small.  Echo (7/15) with EF 45%, normal mechanical aortic valve, normal RV size with mildly decreased systolic function.  - Echo (9/17) with EF 55-60%, mechanical aortic valve with mean gradient 13 mmHg.  - Echo (11/19) with EF 55% with mild LVH, normal RV size with mildly decreased systolic function, normal mechanical aortic valve.  4. Former smoker quit 2011 5. Alcohol abuse but has cut back considerably.  6. ACEI cough 7. Palpitations: Holter monitor (12/15) with occasional PVCs and PACs.   FH: Father- Heart Failure, Grandfather CAD CABG   SH: Quit smoking 2011. Heavy ETOH until 1/15.  Engaged.  Works as Freight forwarder at [employer].   ROS: All systems negative except as listed in HPI, PMH and Problem List.  Current Outpatient Medications  Medication Sig Dispense Refill  . aspirin EC 81 MG tablet Take 1 tablet (81 mg total) by mouth daily. 90 tablet 3  . carvedilol (COREG) 12.5 MG tablet Take 1 tablet (12.5 mg total) by mouth 2 (two) times daily with a meal. Needs appt for further refills 180 tablet 0  . spironolactone (ALDACTONE) 25 MG tablet Take 1 tablet (25 mg total) by mouth daily. 90 tablet 0  . valsartan (DIOVAN) 80 MG tablet Take 1 tablet (80 mg total) by mouth 2 (two) times daily. 180 tablet 0  . warfarin (COUMADIN) 5 MG tablet TAKE AS DIRECTED BY COUMADIN CLINIC 40 tablet 0   No current facility-administered medications for this encounter.   PHYSICAL EXAM: Vitals:   02/07/20 1425  BP: 132/88  Pulse: 76  SpO2: 98%  Weight: 81.2 kg (179 lb)   General: NAD Neck: No JVD, no thyromegaly or thyroid nodule.  Lungs: Clear to auscultation bilaterally with normal respiratory effort. CV: Nondisplaced PMI.  Heart regular S1/S2 with mechanical S2, no S3/S4, 1/6 SEM RUSB.  No peripheral edema.  No carotid bruit.  Normal pedal pulses.  Abdomen: Soft, nontender, no  hepatosplenomegaly, no distention.  Skin: Intact without lesions or rashes.  Neurologic: Alert and oriented x 3.  Psych: Normal affect. Extremities: No clubbing or cyanosis.  HEENT: Normal.   ASSESSMENT & PLAN: 1. Chronic systolic CHF: Nonischemic cardiomyopathy.  Tachy-mediated CMP versus ETOH CMP versus long-standing aortic insufficiency or (most likely) a combination of all 3 possible causes.  He is no longer drinking and has had mechanical AVR.  Echo in 2019 showed EF back up to normal range. NYHA class I, no volume overload.   - Continue Coreg to 12.5 mg bid.  - Continue valsartan 80 mg bid. Intolerant ace due to cough.  - Continue spironolactone 25 mg daily.   - BMET today (should get at least every 3 months, will arrange).  - I will arrange for repeat echo to make sure EF remains normal and also to reassess the mechanical aortic valve.  2. Paroxysmal atrial fibrillation/flutter: s/p TEE/DCCV 08/01/13. Had Maze/LAA obliteration with Bentall.  Post-op atypical flutter.  Remaining in NSR. No longer on amiodarone.   3. Bicuspid aortic valve disorder with severe AI and severely dilated  ascending aorta (5.1 cm on CTA).  Status post mechanical AVR + ascending aorta replacement.  The valve looked good on 7/15 echo. Episode of possible amaurosis fugax in the past.  He is now on ASA 81 mg daily (which will be continued) and on warfarin with goal INR 2.5-3.5. INR managed Coumadin Clinic.  - Reinforced need for SBE prophylaxis - CBC today.  4. ETOH abuse: Occasional ETOH, not heavy as in the past.   5. HTN: Stable. Continue current regimen.  Followup 1 year.   [name] 02/07/2020

## 2020-02-07 NOTE — Patient Instructions (Signed)
Labs done today, your results will be available in MyChart, we will contact you for abnormal readings.  Labs needed in 3 months  Your physician has requested that you have an echocardiogram. Echocardiography is a painless test that uses sound waves to create images of your heart. It provides your doctor with information about the size and shape of your heart and how well your heart's chambers and valves are working. This procedure takes approximately one hour. There are no restrictions for this procedure.  Please call our office in 1 year to schedule your follow up appointment  If you have any questions or concerns before your next appointment please send [place] a message through [place] or call our office at [phone].    TO LEAVE A MESSAGE FOR THE NURSE SELECT OPTION 2, PLEASE LEAVE A MESSAGE INCLUDING: . YOUR NAME . DATE OF BIRTH . CALL BACK NUMBER . REASON FOR CALL**this is important as we prioritize the call backs  [place] AS LONG AS YOU CALL BEFORE 4:00 PM  At the [facility], you and your health needs are our priority. As part of our continuing mission to provide you with exceptional heart care, we have created designated Provider Care Teams. These Care Teams include your primary Cardiologist (physician) and Advanced Practice Providers (APPs- Physician Assistants and Nurse Practitioners) who all work together to provide you with the care you need, when you need it.   You may see any of the following providers on your designated Care Team at your next follow up: [name] Dr Glori Bickers . Dr [name] . Darrick Grinder, NP . Lyda Jester, PA . Audry Riles, PharmD   Please be sure to bring in all your medications bottles to every appointment.

## 2020-02-08 ENCOUNTER — Other Ambulatory Visit: Payer: Self-pay | Admitting: Cardiology

## 2020-02-11 ENCOUNTER — Other Ambulatory Visit (HOSPITAL_COMMUNITY): Payer: Self-pay | Admitting: Cardiology

## 2020-02-15 ENCOUNTER — Encounter (HOSPITAL_COMMUNITY): Payer: Self-pay | Admitting: *Deleted

## 2020-02-24 ENCOUNTER — Other Ambulatory Visit: Payer: Self-pay

## 2020-02-24 ENCOUNTER — Ambulatory Visit (INDEPENDENT_AMBULATORY_CARE_PROVIDER_SITE_OTHER): Payer: BC Managed Care – PPO | Admitting: Pharmacist

## 2020-02-24 DIAGNOSIS — I351 Nonrheumatic aortic (valve) insufficiency: Secondary | ICD-10-CM | POA: Diagnosis not present

## 2020-02-24 DIAGNOSIS — Z5181 Encounter for therapeutic drug level monitoring: Secondary | ICD-10-CM

## 2020-02-24 DIAGNOSIS — Z954 Presence of other heart-valve replacement: Secondary | ICD-10-CM

## 2020-02-24 DIAGNOSIS — I359 Nonrheumatic aortic valve disorder, unspecified: Secondary | ICD-10-CM

## 2020-02-24 DIAGNOSIS — I480 Paroxysmal atrial fibrillation: Secondary | ICD-10-CM | POA: Diagnosis not present

## 2020-02-24 DIAGNOSIS — Z9889 Other specified postprocedural states: Secondary | ICD-10-CM | POA: Diagnosis not present

## 2020-02-24 DIAGNOSIS — Z8679 Personal history of other diseases of the circulatory system: Secondary | ICD-10-CM

## 2020-02-24 LAB — POCT INR: INR: 2.2 (ref 2.0–3.0)

## 2020-02-24 NOTE — Patient Instructions (Signed)
Description   Take 1.5 tablets today, then start taking 1 tablet daily except 1.5 tablets on Sundays and Wednesday. Recheck INR in 2 weeks. Coumadin Clinic [phone], call with any changes in medication or up coming procedure.

## 2020-03-08 ENCOUNTER — Other Ambulatory Visit (HOSPITAL_COMMUNITY): Payer: Self-pay | Admitting: Cardiology

## 2020-03-12 ENCOUNTER — Ambulatory Visit (INDEPENDENT_AMBULATORY_CARE_PROVIDER_SITE_OTHER): Payer: BC Managed Care – PPO | Admitting: *Deleted

## 2020-03-12 ENCOUNTER — Other Ambulatory Visit: Payer: Self-pay

## 2020-03-12 DIAGNOSIS — Z5181 Encounter for therapeutic drug level monitoring: Secondary | ICD-10-CM

## 2020-03-12 DIAGNOSIS — Z8679 Personal history of other diseases of the circulatory system: Secondary | ICD-10-CM

## 2020-03-12 DIAGNOSIS — I359 Nonrheumatic aortic valve disorder, unspecified: Secondary | ICD-10-CM | POA: Diagnosis not present

## 2020-03-12 DIAGNOSIS — I351 Nonrheumatic aortic (valve) insufficiency: Secondary | ICD-10-CM

## 2020-03-12 DIAGNOSIS — I480 Paroxysmal atrial fibrillation: Secondary | ICD-10-CM | POA: Diagnosis not present

## 2020-03-12 DIAGNOSIS — Z9889 Other specified postprocedural states: Secondary | ICD-10-CM | POA: Diagnosis not present

## 2020-03-12 DIAGNOSIS — Z954 Presence of other heart-valve replacement: Secondary | ICD-10-CM

## 2020-03-12 LAB — POCT INR: INR: 2.9 (ref 2.0–3.0)

## 2020-03-12 NOTE — Patient Instructions (Signed)
Description   Continue taking 1 tablet daily except 1.5 tablets on Sundays and Wednesdays. Recheck INR in 4 weeks. Coumadin Clinic [phone], call with any changes in medication or up coming procedure.

## 2020-03-13 ENCOUNTER — Other Ambulatory Visit: Payer: Self-pay | Admitting: Cardiology

## 2020-04-09 ENCOUNTER — Other Ambulatory Visit: Payer: Self-pay

## 2020-04-09 ENCOUNTER — Ambulatory Visit (INDEPENDENT_AMBULATORY_CARE_PROVIDER_SITE_OTHER): Payer: BC Managed Care – PPO | Admitting: *Deleted

## 2020-04-09 DIAGNOSIS — Z9889 Other specified postprocedural states: Secondary | ICD-10-CM

## 2020-04-09 DIAGNOSIS — I359 Nonrheumatic aortic valve disorder, unspecified: Secondary | ICD-10-CM | POA: Diagnosis not present

## 2020-04-09 DIAGNOSIS — I351 Nonrheumatic aortic (valve) insufficiency: Secondary | ICD-10-CM

## 2020-04-09 DIAGNOSIS — Z5181 Encounter for therapeutic drug level monitoring: Secondary | ICD-10-CM

## 2020-04-09 DIAGNOSIS — Z8679 Personal history of other diseases of the circulatory system: Secondary | ICD-10-CM | POA: Diagnosis not present

## 2020-04-09 DIAGNOSIS — I480 Paroxysmal atrial fibrillation: Secondary | ICD-10-CM | POA: Diagnosis not present

## 2020-04-09 DIAGNOSIS — Z954 Presence of other heart-valve replacement: Secondary | ICD-10-CM

## 2020-04-09 LAB — POCT INR: INR: 2.4 (ref 2.0–3.0)

## 2020-04-09 NOTE — Patient Instructions (Addendum)
Description   Take 1.5 tablets of warfarin today, then continue taking 1 tablet daily except 1.5 tablets on Sundays and Wednesdays. Recheck INR in 3 weeks. Coumadin Clinic [phone], call with any changes in medication or up coming procedure.

## 2020-04-24 ENCOUNTER — Telehealth (HOSPITAL_COMMUNITY): Payer: Self-pay | Admitting: *Deleted

## 2020-04-24 NOTE — Telephone Encounter (Signed)
Pt left vm stating he was out of carvedilol for about a week and a half almost two weeks he restarted two days ago but feels he is in afib. Called pt to get more information he said he was slightly short of breath, a little dizzy, resting heart rate 85-90 bpm. Pt didn't know if he should restart amiodarone or not.   Routed to [place] for advice

## 2020-04-24 NOTE — Telephone Encounter (Signed)
Needs to come get ECG.

## 2020-04-24 NOTE — Telephone Encounter (Signed)
Left VM for pt to return call to schedule ecg.

## 2020-04-26 NOTE — Telephone Encounter (Signed)
Spoke with patient and he reports that he has restarted his medication already and feels fine. I advised patient that he should still have the ecg done just to be on the safe side. Patient stated that he cant come in until Monday to have it done due to his job. Nurse appt scheduled for 10/11 at 2pm.

## 2020-04-30 ENCOUNTER — Other Ambulatory Visit: Payer: Self-pay

## 2020-04-30 ENCOUNTER — Ambulatory Visit (INDEPENDENT_AMBULATORY_CARE_PROVIDER_SITE_OTHER): Payer: BC Managed Care – PPO | Admitting: *Deleted

## 2020-04-30 ENCOUNTER — Ambulatory Visit (HOSPITAL_COMMUNITY)
Admission: RE | Admit: 2020-04-30 | Discharge: 2020-04-30 | Disposition: A | Discharge status: Home / Self Care | Payer: BC Managed Care – PPO | Source: Ambulatory Visit | Attending: Internal Medicine | Admitting: Internal Medicine

## 2020-04-30 VITALS — HR 127

## 2020-04-30 DIAGNOSIS — Z9889 Other specified postprocedural states: Secondary | ICD-10-CM

## 2020-04-30 DIAGNOSIS — Z8679 Personal history of other diseases of the circulatory system: Secondary | ICD-10-CM | POA: Diagnosis not present

## 2020-04-30 DIAGNOSIS — I480 Paroxysmal atrial fibrillation: Secondary | ICD-10-CM | POA: Diagnosis not present

## 2020-04-30 DIAGNOSIS — I359 Nonrheumatic aortic valve disorder, unspecified: Secondary | ICD-10-CM | POA: Diagnosis not present

## 2020-04-30 DIAGNOSIS — Z954 Presence of other heart-valve replacement: Secondary | ICD-10-CM

## 2020-04-30 DIAGNOSIS — I351 Nonrheumatic aortic (valve) insufficiency: Secondary | ICD-10-CM | POA: Diagnosis not present

## 2020-04-30 DIAGNOSIS — Z5181 Encounter for therapeutic drug level monitoring: Secondary | ICD-10-CM

## 2020-04-30 LAB — POCT INR: INR: 1.7 — AB (ref 2.0–3.0)

## 2020-04-30 MED ORDER — AMIODARONE HCL 200 MG PO TABS
ORAL_TABLET | ORAL | 0 refills | Status: DC
Start: 2020-04-30 — End: 2020-05-25

## 2020-04-30 NOTE — [facility] Notes (Signed)
Patient seen in clinic for ecg per Dr.McLean. Pt felt as if he was in afib last week after being off of carvedilol for almost two weeks. Carvedilol was restarted and pt felt better but per Dr.McLean pt still needed an ecg. ECG today showed patient was in afib with RVR. Pt states he feels slightly dizzy. Per Dr.McLean pt needs DCCV but patient asked if could try amiodarone first. Pt stated this happened years ago and amiodarone worked and he ended up not needing DCCV. Per Dr.McLean take amiodarone 400mg  bid x 7days, then 200mg  bid x 7days, then take 200mg  daily. Dr.McLean does not want patient on amiodarone long term due to side effects that are worse than DCCV. Schedule repeat ecg next week. Hold on scheduling DCCV. Pt aware and agreeable with plan. Lab appt scheduled and amiodarone sent to pharmacy. Pt also advised to call the office if symptoms persist or worsen.

## 2020-04-30 NOTE — Patient Instructions (Signed)
Description   Take 1.5 tablets of warfarin today and tomorrow then continue taking 1 tablet daily except 1.5 tablets on Sundays and Wednesdays. Recheck INR in 2 weeks. Coumadin Clinic [phone], call with any changes in medication or up coming procedure.

## 2020-05-10 ENCOUNTER — Other Ambulatory Visit: Payer: Self-pay

## 2020-05-10 ENCOUNTER — Ambulatory Visit (HOSPITAL_COMMUNITY)
Admission: RE | Admit: 2020-05-10 | Discharge: 2020-05-10 | Disposition: A | Discharge status: Home / Self Care | Payer: BC Managed Care – PPO | Source: Ambulatory Visit | Attending: Cardiology | Admitting: Cardiology

## 2020-05-10 DIAGNOSIS — I480 Paroxysmal atrial fibrillation: Secondary | ICD-10-CM | POA: Diagnosis not present

## 2020-05-10 DIAGNOSIS — R9431 Abnormal electrocardiogram [ECG] [EKG]: Secondary | ICD-10-CM | POA: Insufficient documentation

## 2020-05-10 NOTE — [facility] Notes (Signed)
Pt seen today for repeat  ekg. Last ekg 04/30/20 ( see note below).  Per Darrick Grinder, NP-c EKG today shows NSR continue with Dr.McLean's plan. Pt aware and verbalized understanding.   Signed       Patient seen in clinic for ecg per Dr.McLean. Pt felt as if he was in afib last week after being off of carvedilol for almost two weeks. Carvedilol was restarted and pt felt better but per Dr.McLean pt still needed an ecg. ECG today showed patient was in afib with RVR. Pt states he feels slightly dizzy. Per Dr.McLean pt needs DCCV but patient asked if could try amiodarone first. Pt stated this happened years ago and amiodarone worked and he ended up not needing DCCV. Per Dr.McLean take amiodarone 400mg  bid x 7days, then 200mg  bid x 7days, then take 200mg  daily. Dr.McLean does not want patient on amiodarone long term due to side effects that are worse than DCCV. Schedule repeat ecg next week. Hold on scheduling DCCV. Pt aware and agreeable with plan. Lab appt scheduled and amiodarone sent to pharmacy. Pt also advised to call the office if symptoms persist or worsen.         Electronically signed by Harvie Junior, CMA at 04/30/2020 2:48 PM

## 2020-05-14 ENCOUNTER — Other Ambulatory Visit: Payer: Self-pay

## 2020-05-14 ENCOUNTER — Ambulatory Visit (INDEPENDENT_AMBULATORY_CARE_PROVIDER_SITE_OTHER): Payer: BC Managed Care – PPO | Admitting: *Deleted

## 2020-05-14 ENCOUNTER — Ambulatory Visit (HOSPITAL_COMMUNITY)
Admission: RE | Admit: 2020-05-14 | Discharge: 2020-05-14 | Disposition: A | Discharge status: Home / Self Care | Payer: BC Managed Care – PPO | Source: Ambulatory Visit | Attending: Cardiology | Admitting: Cardiology

## 2020-05-14 DIAGNOSIS — I4892 Unspecified atrial flutter: Secondary | ICD-10-CM | POA: Diagnosis not present

## 2020-05-14 DIAGNOSIS — I509 Heart failure, unspecified: Secondary | ICD-10-CM | POA: Insufficient documentation

## 2020-05-14 DIAGNOSIS — I359 Nonrheumatic aortic valve disorder, unspecified: Secondary | ICD-10-CM

## 2020-05-14 DIAGNOSIS — I480 Paroxysmal atrial fibrillation: Secondary | ICD-10-CM

## 2020-05-14 DIAGNOSIS — Z954 Presence of other heart-valve replacement: Secondary | ICD-10-CM

## 2020-05-14 DIAGNOSIS — I351 Nonrheumatic aortic (valve) insufficiency: Secondary | ICD-10-CM

## 2020-05-14 DIAGNOSIS — Z9889 Other specified postprocedural states: Secondary | ICD-10-CM | POA: Diagnosis not present

## 2020-05-14 DIAGNOSIS — I429 Cardiomyopathy, unspecified: Secondary | ICD-10-CM | POA: Diagnosis not present

## 2020-05-14 DIAGNOSIS — I4891 Unspecified atrial fibrillation: Secondary | ICD-10-CM | POA: Diagnosis not present

## 2020-05-14 DIAGNOSIS — Z5181 Encounter for therapeutic drug level monitoring: Secondary | ICD-10-CM | POA: Diagnosis not present

## 2020-05-14 DIAGNOSIS — Z952 Presence of prosthetic heart valve: Secondary | ICD-10-CM | POA: Diagnosis not present

## 2020-05-14 DIAGNOSIS — Z8679 Personal history of other diseases of the circulatory system: Secondary | ICD-10-CM | POA: Diagnosis not present

## 2020-05-14 LAB — POCT INR: INR: 5.7 — AB (ref 2.0–3.0)

## 2020-05-14 LAB — ECHOCARDIOGRAM COMPLETE
AR max vel: 4.56 cm2
AV Area VTI: 4.72 cm2
AV Area mean vel: 4.54 cm2
AV Mean grad: 6 mmHg
AV Peak grad: 10.9 mmHg
Ao pk vel: 1.65 m/s
Area-P 1/2: 3.91 cm2
P 1/2 time: 351 msec
S' Lateral: 4.2 cm

## 2020-05-14 NOTE — Patient Instructions (Addendum)
Description    Hold warfarin 10/26,10/27 and 10/28,  then start taking 1 tablet daily except for 1/2 a tablet on Thursdays. Recheck INR in 2 weeks. Coumadin Clinic [phone], call with any changes in medication or up coming procedure.

## 2020-05-14 NOTE — [facility] Notes (Signed)
  Echocardiogram 2D Echocardiogram has been performed.  Trulee Hamstra G Zidan Helget 05/14/2020, 3:59 PM

## 2020-05-15 ENCOUNTER — Encounter (HOSPITAL_COMMUNITY): Payer: Self-pay

## 2020-05-19 ENCOUNTER — Other Ambulatory Visit: Payer: Self-pay | Admitting: Cardiology

## 2020-05-25 ENCOUNTER — Other Ambulatory Visit (HOSPITAL_COMMUNITY): Payer: Self-pay | Admitting: Cardiology

## 2020-05-30 ENCOUNTER — Telehealth: Payer: Self-pay | Admitting: *Deleted

## 2020-05-30 NOTE — Telephone Encounter (Signed)
Pt is overdue to have INR checked. Called pt and LMOM for pt to call coumadin clinic back at [phone].

## 2020-06-04 ENCOUNTER — Telehealth: Payer: Self-pay | Admitting: *Deleted

## 2020-06-04 NOTE — Telephone Encounter (Signed)
Pt overdue to have INR checked. Called pt and LMOM for pt to call coumadin clinic back and schedule appointment.

## 2020-06-07 ENCOUNTER — Telehealth: Payer: Self-pay | Admitting: *Deleted

## 2020-06-07 NOTE — Telephone Encounter (Signed)
Pt is overdue for INR check. Called pt and LMOM.

## 2020-06-11 ENCOUNTER — Telehealth: Payer: Self-pay | Admitting: *Deleted

## 2020-06-11 NOTE — Telephone Encounter (Signed)
Called pt since he is overdue for Anticoagulation Appt; called pt and left a message for pt to call back to get rescheduled.

## 2020-06-19 ENCOUNTER — Other Ambulatory Visit: Payer: Self-pay

## 2020-06-19 ENCOUNTER — Ambulatory Visit (INDEPENDENT_AMBULATORY_CARE_PROVIDER_SITE_OTHER): Payer: BC Managed Care – PPO | Admitting: *Deleted

## 2020-06-19 DIAGNOSIS — Z9889 Other specified postprocedural states: Secondary | ICD-10-CM | POA: Diagnosis not present

## 2020-06-19 DIAGNOSIS — I351 Nonrheumatic aortic (valve) insufficiency: Secondary | ICD-10-CM

## 2020-06-19 DIAGNOSIS — Z954 Presence of other heart-valve replacement: Secondary | ICD-10-CM

## 2020-06-19 DIAGNOSIS — I480 Paroxysmal atrial fibrillation: Secondary | ICD-10-CM | POA: Diagnosis not present

## 2020-06-19 DIAGNOSIS — I359 Nonrheumatic aortic valve disorder, unspecified: Secondary | ICD-10-CM

## 2020-06-19 DIAGNOSIS — Z8679 Personal history of other diseases of the circulatory system: Secondary | ICD-10-CM

## 2020-06-19 DIAGNOSIS — Z5181 Encounter for therapeutic drug level monitoring: Secondary | ICD-10-CM

## 2020-06-19 LAB — POCT INR: INR: 2.2 (ref 2.0–3.0)

## 2020-06-19 NOTE — Patient Instructions (Addendum)
  Description   Today take 1.5 tablets taking then continue taking 1 tablet daily except for 1/2 tablet on Thursdays. Recheck INR in 3 weeks. Coumadin Clinic [phone], call with any changes in medication or up coming procedure.

## 2020-06-30 ENCOUNTER — Emergency Department (HOSPITAL_COMMUNITY)
Admission: EM | Admit: 2020-06-30 | Discharge: 2020-07-01 | Discharge status: Against Medical Advice | Payer: BC Managed Care – PPO

## 2020-06-30 NOTE — ED Notes (Signed)
No answer for triage x5 now

## 2020-06-30 NOTE — ED Notes (Signed)
No answer for triage x3 

## 2020-07-06 ENCOUNTER — Telehealth: Payer: Self-pay | Admitting: Internal Medicine

## 2020-07-06 NOTE — Telephone Encounter (Signed)
Mr. [name] Hinton called our triage tonight after testing positive for COVID-19.   He reports 5 or so days of mild symptoms of a viral illness, including fatigue, headache, congestion, and more recently a change in taste sensation. He took a home test today which was positive for COVID-19. He was fully vaccinated this past April. He works at [employer] and lives with his wife and two children, none with severe chronic illness. His wife recently received her booster and will be tested tomorrow by her PCP - her home test today was negative. The patient denies any significant dyspnea. I agreed that he does not seem to warrant ED evaluation at this point. We reviewed CDC isolation guidelines. He wishes to obtain a more definitive (PCR) test to confirm that he does need to stay home from work. I advised he do this through his PCP and will notify Dr. Aundra Dubin of his situation, as he has requested.

## 2020-07-08 DIAGNOSIS — J069 Acute upper respiratory infection, unspecified: Secondary | ICD-10-CM | POA: Diagnosis not present

## 2020-07-08 DIAGNOSIS — Z20822 Contact with and (suspected) exposure to covid-19: Secondary | ICD-10-CM | POA: Diagnosis not present

## 2020-07-16 ENCOUNTER — Other Ambulatory Visit: Payer: Self-pay

## 2020-07-16 ENCOUNTER — Ambulatory Visit (INDEPENDENT_AMBULATORY_CARE_PROVIDER_SITE_OTHER): Payer: BC Managed Care – PPO | Admitting: *Deleted

## 2020-07-16 DIAGNOSIS — Z8679 Personal history of other diseases of the circulatory system: Secondary | ICD-10-CM | POA: Diagnosis not present

## 2020-07-16 DIAGNOSIS — Z5181 Encounter for therapeutic drug level monitoring: Secondary | ICD-10-CM

## 2020-07-16 DIAGNOSIS — I480 Paroxysmal atrial fibrillation: Secondary | ICD-10-CM | POA: Diagnosis not present

## 2020-07-16 DIAGNOSIS — Z9889 Other specified postprocedural states: Secondary | ICD-10-CM | POA: Diagnosis not present

## 2020-07-16 DIAGNOSIS — I359 Nonrheumatic aortic valve disorder, unspecified: Secondary | ICD-10-CM | POA: Diagnosis not present

## 2020-07-16 DIAGNOSIS — I351 Nonrheumatic aortic (valve) insufficiency: Secondary | ICD-10-CM | POA: Diagnosis not present

## 2020-07-16 DIAGNOSIS — Z954 Presence of other heart-valve replacement: Secondary | ICD-10-CM

## 2020-07-16 LAB — POCT INR: INR: 3.6 — AB (ref 2.0–3.0)

## 2020-07-16 NOTE — Patient Instructions (Addendum)
  Description   Take 1/2 a tablet and then start taking 1 tablet daily. Recheck INR in 3 weeks. [facility] [phone], call with any changes in medication or up coming procedure.

## 2020-08-06 ENCOUNTER — Other Ambulatory Visit: Payer: Self-pay

## 2020-08-06 DIAGNOSIS — I351 Nonrheumatic aortic (valve) insufficiency: Secondary | ICD-10-CM

## 2020-08-06 DIAGNOSIS — Z954 Presence of other heart-valve replacement: Secondary | ICD-10-CM

## 2020-08-06 DIAGNOSIS — Z9889 Other specified postprocedural states: Secondary | ICD-10-CM

## 2020-08-06 DIAGNOSIS — I359 Nonrheumatic aortic valve disorder, unspecified: Secondary | ICD-10-CM

## 2020-08-06 DIAGNOSIS — Z8679 Personal history of other diseases of the circulatory system: Secondary | ICD-10-CM

## 2020-08-06 DIAGNOSIS — Z5181 Encounter for therapeutic drug level monitoring: Secondary | ICD-10-CM

## 2020-08-06 DIAGNOSIS — I480 Paroxysmal atrial fibrillation: Secondary | ICD-10-CM

## 2020-08-21 ENCOUNTER — Telehealth: Payer: Self-pay | Admitting: *Deleted

## 2020-08-21 NOTE — Telephone Encounter (Signed)
Called pt since he is 2 weeks overdue for his INR recheck and has not rescheduled his appt that he canceled at that time. There was no answer so had to leave a message for the pt to call back.

## 2020-08-27 ENCOUNTER — Telehealth: Payer: Self-pay | Admitting: *Deleted

## 2020-08-27 NOTE — Telephone Encounter (Signed)
Called pt since he is overdue for an Anticoagulation Appt and is requesting a refill on his Warfarin. The pt did not answer so left him a message to advise to call back to schedule an appt. Will await a call back from the pt.

## 2020-08-30 ENCOUNTER — Telehealth: Payer: Self-pay | Admitting: *Deleted

## 2020-08-30 NOTE — Telephone Encounter (Signed)
Called pt since he is overdue for an INR appt, the voicemail is full so unable to leave a message. Will try at a later date/time.

## 2020-09-18 ENCOUNTER — Other Ambulatory Visit (HOSPITAL_COMMUNITY): Payer: Self-pay

## 2020-09-18 MED ORDER — WARFARIN SODIUM 5 MG PO TABS: ORAL_TABLET | ORAL | 1 refills | Status: DC

## 2020-09-19 ENCOUNTER — Telehealth: Payer: Self-pay | Admitting: *Deleted

## 2020-09-19 NOTE — Telephone Encounter (Signed)
Attempted to call pt to make him aware that he was overdue to have his INR checked. Called and then the phone stopped ringing, no answer.

## 2020-09-26 ENCOUNTER — Telehealth: Payer: Self-pay | Admitting: *Deleted

## 2020-09-26 NOTE — Telephone Encounter (Signed)
Called pt since he is overdue for Anticoagulation Appt; left a message for the pt to call back. Will await.

## 2020-10-04 ENCOUNTER — Telehealth: Payer: Self-pay | Admitting: *Deleted

## 2020-10-04 NOTE — Telephone Encounter (Signed)
Pt is overdue to have INR checked. Called pt and LMOM to call coumadin clinic back to schedule an appointment.  

## 2020-10-15 ENCOUNTER — Telehealth: Payer: Self-pay

## 2020-10-15 NOTE — Telephone Encounter (Signed)
Attempted to contact pt.  Pt is overdue for INR check, left message on machine to call back for appt ASAP.  Last INR check 07/16/20, pt was due to retun on 08/06/20.

## 2020-10-29 ENCOUNTER — Other Ambulatory Visit (HOSPITAL_COMMUNITY): Payer: Self-pay

## 2020-10-29 MED ORDER — SPIRONOLACTONE 25 MG PO TABS: ORAL_TABLET | ORAL | 3 refills | Status: DC

## 2020-10-30 NOTE — Patient Instructions (Addendum)
  Description   Take an extra 1/2 tablet today and then start taking 1 tablet daily except 1.5 on Mondays. Recheck INR in 3 weeks. [facility] [phone], call with any changes in medication or up coming procedure.

## 2020-10-31 ENCOUNTER — Other Ambulatory Visit: Payer: Self-pay

## 2020-10-31 ENCOUNTER — Ambulatory Visit (INDEPENDENT_AMBULATORY_CARE_PROVIDER_SITE_OTHER): Payer: BC Managed Care – PPO

## 2020-10-31 DIAGNOSIS — Z9889 Other specified postprocedural states: Secondary | ICD-10-CM

## 2020-10-31 DIAGNOSIS — Z8679 Personal history of other diseases of the circulatory system: Secondary | ICD-10-CM

## 2020-10-31 DIAGNOSIS — I480 Paroxysmal atrial fibrillation: Secondary | ICD-10-CM | POA: Diagnosis not present

## 2020-10-31 DIAGNOSIS — Z5181 Encounter for therapeutic drug level monitoring: Secondary | ICD-10-CM

## 2020-10-31 DIAGNOSIS — I351 Nonrheumatic aortic (valve) insufficiency: Secondary | ICD-10-CM | POA: Diagnosis not present

## 2020-10-31 DIAGNOSIS — Z954 Presence of other heart-valve replacement: Secondary | ICD-10-CM

## 2020-10-31 DIAGNOSIS — I359 Nonrheumatic aortic valve disorder, unspecified: Secondary | ICD-10-CM

## 2020-10-31 LAB — POCT INR: INR: 1.9 — AB (ref 2.0–3.0)

## 2020-11-06 ENCOUNTER — Other Ambulatory Visit (HOSPITAL_COMMUNITY): Payer: Self-pay

## 2020-11-06 MED ORDER — VALSARTAN 80 MG PO TABS: 80.0000 mg | ORAL_TABLET | Freq: Two times a day (BID) | ORAL | 4 refills | Status: DC

## 2020-11-21 ENCOUNTER — Ambulatory Visit (INDEPENDENT_AMBULATORY_CARE_PROVIDER_SITE_OTHER): Payer: BC Managed Care – PPO

## 2020-11-21 ENCOUNTER — Other Ambulatory Visit: Payer: Self-pay

## 2020-11-21 DIAGNOSIS — Z9889 Other specified postprocedural states: Secondary | ICD-10-CM | POA: Diagnosis not present

## 2020-11-21 DIAGNOSIS — I359 Nonrheumatic aortic valve disorder, unspecified: Secondary | ICD-10-CM

## 2020-11-21 DIAGNOSIS — I351 Nonrheumatic aortic (valve) insufficiency: Secondary | ICD-10-CM

## 2020-11-21 DIAGNOSIS — Z954 Presence of other heart-valve replacement: Secondary | ICD-10-CM

## 2020-11-21 DIAGNOSIS — Z5181 Encounter for therapeutic drug level monitoring: Secondary | ICD-10-CM

## 2020-11-21 DIAGNOSIS — I480 Paroxysmal atrial fibrillation: Secondary | ICD-10-CM | POA: Diagnosis not present

## 2020-11-21 DIAGNOSIS — Z8679 Personal history of other diseases of the circulatory system: Secondary | ICD-10-CM

## 2020-11-21 LAB — POCT INR: INR: 3.5 — AB (ref 2.0–3.0)

## 2020-11-21 NOTE — Patient Instructions (Signed)
Description   Continue on same dosage 1 tablet daily except 1.5 on Mondays. Recheck INR in 4 weeks. [facility] [phone], call with any changes in medication or up coming procedure.       

## 2020-11-25 ENCOUNTER — Other Ambulatory Visit (HOSPITAL_COMMUNITY): Payer: Self-pay | Admitting: Cardiology

## 2020-12-21 ENCOUNTER — Ambulatory Visit (INDEPENDENT_AMBULATORY_CARE_PROVIDER_SITE_OTHER): Payer: BC Managed Care – PPO | Admitting: Pharmacist

## 2020-12-21 ENCOUNTER — Other Ambulatory Visit: Payer: Self-pay

## 2020-12-21 DIAGNOSIS — I480 Paroxysmal atrial fibrillation: Secondary | ICD-10-CM | POA: Diagnosis not present

## 2020-12-21 DIAGNOSIS — Z9889 Other specified postprocedural states: Secondary | ICD-10-CM

## 2020-12-21 DIAGNOSIS — Z5181 Encounter for therapeutic drug level monitoring: Secondary | ICD-10-CM | POA: Diagnosis not present

## 2020-12-21 DIAGNOSIS — I359 Nonrheumatic aortic valve disorder, unspecified: Secondary | ICD-10-CM | POA: Diagnosis not present

## 2020-12-21 DIAGNOSIS — I351 Nonrheumatic aortic (valve) insufficiency: Secondary | ICD-10-CM

## 2020-12-21 DIAGNOSIS — Z954 Presence of other heart-valve replacement: Secondary | ICD-10-CM

## 2020-12-21 DIAGNOSIS — Z8679 Personal history of other diseases of the circulatory system: Secondary | ICD-10-CM

## 2020-12-21 LAB — POCT INR: INR: 3.4 — AB (ref 2.0–3.0)

## 2020-12-21 NOTE — Patient Instructions (Addendum)
Continue on same dosage 1 tablet daily except 1.5 on Mondays. Recheck INR in 5 weeks. [facility] [phone], call with any changes in medication or up coming procedure.

## 2021-01-28 ENCOUNTER — Ambulatory Visit (INDEPENDENT_AMBULATORY_CARE_PROVIDER_SITE_OTHER): Payer: BC Managed Care – PPO | Admitting: *Deleted

## 2021-01-28 ENCOUNTER — Other Ambulatory Visit: Payer: Self-pay

## 2021-01-28 DIAGNOSIS — I351 Nonrheumatic aortic (valve) insufficiency: Secondary | ICD-10-CM | POA: Diagnosis not present

## 2021-01-28 DIAGNOSIS — I480 Paroxysmal atrial fibrillation: Secondary | ICD-10-CM | POA: Diagnosis not present

## 2021-01-28 DIAGNOSIS — Z954 Presence of other heart-valve replacement: Secondary | ICD-10-CM

## 2021-01-28 DIAGNOSIS — Z9889 Other specified postprocedural states: Secondary | ICD-10-CM | POA: Diagnosis not present

## 2021-01-28 DIAGNOSIS — Z8679 Personal history of other diseases of the circulatory system: Secondary | ICD-10-CM

## 2021-01-28 DIAGNOSIS — Z5181 Encounter for therapeutic drug level monitoring: Secondary | ICD-10-CM

## 2021-01-28 DIAGNOSIS — I359 Nonrheumatic aortic valve disorder, unspecified: Secondary | ICD-10-CM

## 2021-01-28 LAB — POCT INR: INR: 4.9 — AB (ref 2.0–3.0)

## 2021-01-28 NOTE — Patient Instructions (Signed)
Description   Do not take any Warfarin Tuesday (already taken today's dose) and No Warfarin Wednesday then continue on same dosage 1 tablet daily except 1.5 on Mondays. Recheck INR in 2 weeks. [facility] [phone], call with any changes in medication or up coming procedure.

## 2021-01-30 ENCOUNTER — Other Ambulatory Visit (HOSPITAL_COMMUNITY): Payer: Self-pay | Admitting: Cardiology

## 2021-01-30 NOTE — Telephone Encounter (Signed)
Prescription refill request received for warfarin Lov: 02/07/2020 Next INR check: 7/25 Warfarin tablet strength:  5mg 

## 2021-02-11 ENCOUNTER — Other Ambulatory Visit: Payer: Self-pay

## 2021-02-11 ENCOUNTER — Ambulatory Visit (INDEPENDENT_AMBULATORY_CARE_PROVIDER_SITE_OTHER): Payer: BC Managed Care – PPO

## 2021-02-11 DIAGNOSIS — Z5181 Encounter for therapeutic drug level monitoring: Secondary | ICD-10-CM

## 2021-02-11 DIAGNOSIS — I351 Nonrheumatic aortic (valve) insufficiency: Secondary | ICD-10-CM | POA: Diagnosis not present

## 2021-02-11 DIAGNOSIS — Z954 Presence of other heart-valve replacement: Secondary | ICD-10-CM

## 2021-02-11 DIAGNOSIS — Z9889 Other specified postprocedural states: Secondary | ICD-10-CM

## 2021-02-11 DIAGNOSIS — I359 Nonrheumatic aortic valve disorder, unspecified: Secondary | ICD-10-CM

## 2021-02-11 DIAGNOSIS — I480 Paroxysmal atrial fibrillation: Secondary | ICD-10-CM

## 2021-02-11 DIAGNOSIS — Z8679 Personal history of other diseases of the circulatory system: Secondary | ICD-10-CM

## 2021-02-11 LAB — POCT INR: INR: 3.1 — AB (ref 2.0–3.0)

## 2021-02-11 NOTE — Patient Instructions (Signed)
Description   Continue on same dosage 1 tablet daily except 1.5 on Mondays. Recheck INR in 3 weeks. [facility] [phone], call with any changes in medication or up coming procedure.

## 2021-03-05 ENCOUNTER — Other Ambulatory Visit: Payer: Self-pay

## 2021-03-05 ENCOUNTER — Ambulatory Visit (INDEPENDENT_AMBULATORY_CARE_PROVIDER_SITE_OTHER): Payer: BC Managed Care – PPO

## 2021-03-05 DIAGNOSIS — I351 Nonrheumatic aortic (valve) insufficiency: Secondary | ICD-10-CM | POA: Diagnosis not present

## 2021-03-05 DIAGNOSIS — Z5181 Encounter for therapeutic drug level monitoring: Secondary | ICD-10-CM

## 2021-03-05 DIAGNOSIS — I480 Paroxysmal atrial fibrillation: Secondary | ICD-10-CM

## 2021-03-05 DIAGNOSIS — Z954 Presence of other heart-valve replacement: Secondary | ICD-10-CM

## 2021-03-05 DIAGNOSIS — Z8679 Personal history of other diseases of the circulatory system: Secondary | ICD-10-CM

## 2021-03-05 DIAGNOSIS — I359 Nonrheumatic aortic valve disorder, unspecified: Secondary | ICD-10-CM | POA: Diagnosis not present

## 2021-03-05 DIAGNOSIS — Z9889 Other specified postprocedural states: Secondary | ICD-10-CM

## 2021-03-05 LAB — POCT INR: INR: 3.3 — AB (ref 2.0–3.0)

## 2021-03-05 NOTE — Patient Instructions (Signed)
Description   Continue on same dosage 1 tablet daily except 1.5 on Mondays. Recheck INR in 4 weeks. [facility] [phone], call with any changes in medication or up coming procedure.

## 2021-03-11 ENCOUNTER — Other Ambulatory Visit (HOSPITAL_COMMUNITY): Payer: Self-pay | Admitting: Cardiology

## 2021-04-01 ENCOUNTER — Other Ambulatory Visit: Payer: Self-pay

## 2021-04-01 ENCOUNTER — Ambulatory Visit (INDEPENDENT_AMBULATORY_CARE_PROVIDER_SITE_OTHER): Payer: BC Managed Care – PPO

## 2021-04-01 DIAGNOSIS — Z8679 Personal history of other diseases of the circulatory system: Secondary | ICD-10-CM | POA: Diagnosis not present

## 2021-04-01 DIAGNOSIS — I359 Nonrheumatic aortic valve disorder, unspecified: Secondary | ICD-10-CM

## 2021-04-01 DIAGNOSIS — I480 Paroxysmal atrial fibrillation: Secondary | ICD-10-CM

## 2021-04-01 DIAGNOSIS — Z954 Presence of other heart-valve replacement: Secondary | ICD-10-CM

## 2021-04-01 DIAGNOSIS — Z9889 Other specified postprocedural states: Secondary | ICD-10-CM | POA: Diagnosis not present

## 2021-04-01 DIAGNOSIS — I351 Nonrheumatic aortic (valve) insufficiency: Secondary | ICD-10-CM

## 2021-04-01 DIAGNOSIS — Z5181 Encounter for therapeutic drug level monitoring: Secondary | ICD-10-CM

## 2021-04-01 LAB — POCT INR: INR: 3.5 — AB (ref 2.0–3.0)

## 2021-04-01 NOTE — Patient Instructions (Signed)
Description   Continue on same dosage 1 tablet daily except 1.5 on Mondays. Recheck INR in 4 weeks. [facility] [phone], call with any changes in medication or up coming procedure.

## 2021-04-29 ENCOUNTER — Ambulatory Visit (INDEPENDENT_AMBULATORY_CARE_PROVIDER_SITE_OTHER): Payer: BC Managed Care – PPO

## 2021-04-29 ENCOUNTER — Other Ambulatory Visit: Payer: Self-pay

## 2021-04-29 DIAGNOSIS — Z9889 Other specified postprocedural states: Secondary | ICD-10-CM

## 2021-04-29 DIAGNOSIS — Z5181 Encounter for therapeutic drug level monitoring: Secondary | ICD-10-CM

## 2021-04-29 DIAGNOSIS — I359 Nonrheumatic aortic valve disorder, unspecified: Secondary | ICD-10-CM | POA: Diagnosis not present

## 2021-04-29 DIAGNOSIS — Z8679 Personal history of other diseases of the circulatory system: Secondary | ICD-10-CM

## 2021-04-29 DIAGNOSIS — I480 Paroxysmal atrial fibrillation: Secondary | ICD-10-CM | POA: Diagnosis not present

## 2021-04-29 DIAGNOSIS — I351 Nonrheumatic aortic (valve) insufficiency: Secondary | ICD-10-CM | POA: Diagnosis not present

## 2021-04-29 DIAGNOSIS — Z954 Presence of other heart-valve replacement: Secondary | ICD-10-CM

## 2021-04-29 LAB — POCT INR: INR: 3 (ref 2.0–3.0)

## 2021-04-29 NOTE — Patient Instructions (Signed)
Continue on same dosage 1 tablet daily except 1.5 on Mondays. Recheck INR in 5 weeks. [facility] [phone], call with any changes in medication or up coming procedure.

## 2021-05-28 ENCOUNTER — Other Ambulatory Visit (HOSPITAL_COMMUNITY): Payer: Self-pay

## 2021-05-28 MED ORDER — CARVEDILOL 12.5 MG PO TABS: ORAL_TABLET | ORAL | 1 refills | Status: DC

## 2021-06-03 ENCOUNTER — Encounter (HOSPITAL_COMMUNITY): Payer: BC Managed Care – PPO | Admitting: Cardiology

## 2021-06-04 ENCOUNTER — Other Ambulatory Visit (HOSPITAL_COMMUNITY): Payer: Self-pay | Admitting: Cardiology

## 2021-06-04 NOTE — Telephone Encounter (Signed)
Overdue for follow-up, had to cancel appt secondary to the Covid.  Rescheduled f/u appt for 06/17/21.  Will refill rx to get pt to upcoming appt.

## 2021-06-21 ENCOUNTER — Telehealth: Payer: Self-pay

## 2021-06-21 NOTE — Telephone Encounter (Signed)
INR overdue. Called, no answer. Unable to leave voicemail.

## 2021-07-04 ENCOUNTER — Encounter (HOSPITAL_COMMUNITY): Payer: Self-pay | Admitting: Cardiology

## 2021-07-04 ENCOUNTER — Ambulatory Visit (HOSPITAL_COMMUNITY)
Admission: RE | Admit: 2021-07-04 | Discharge: 2021-07-04 | Disposition: A | Discharge status: Home / Self Care | Payer: BC Managed Care – PPO | Source: Ambulatory Visit | Attending: Cardiology | Admitting: Cardiology

## 2021-07-04 ENCOUNTER — Other Ambulatory Visit: Payer: Self-pay

## 2021-07-04 VITALS — BP 120/78 | HR 77 | Wt 185.0 lb

## 2021-07-04 DIAGNOSIS — F1091 Alcohol use, unspecified, in remission: Secondary | ICD-10-CM | POA: Insufficient documentation

## 2021-07-04 DIAGNOSIS — I110 Hypertensive heart disease with heart failure: Secondary | ICD-10-CM | POA: Insufficient documentation

## 2021-07-04 DIAGNOSIS — I5022 Chronic systolic (congestive) heart failure: Secondary | ICD-10-CM | POA: Diagnosis not present

## 2021-07-04 DIAGNOSIS — I428 Other cardiomyopathies: Secondary | ICD-10-CM | POA: Insufficient documentation

## 2021-07-04 DIAGNOSIS — Q231 Congenital insufficiency of aortic valve: Secondary | ICD-10-CM | POA: Diagnosis not present

## 2021-07-04 DIAGNOSIS — I480 Paroxysmal atrial fibrillation: Secondary | ICD-10-CM | POA: Diagnosis not present

## 2021-07-04 DIAGNOSIS — Z8616 Personal history of COVID-19: Secondary | ICD-10-CM | POA: Insufficient documentation

## 2021-07-04 DIAGNOSIS — Z79899 Other long term (current) drug therapy: Secondary | ICD-10-CM | POA: Diagnosis not present

## 2021-07-04 DIAGNOSIS — E785 Hyperlipidemia, unspecified: Secondary | ICD-10-CM | POA: Insufficient documentation

## 2021-07-04 DIAGNOSIS — Z87891 Personal history of nicotine dependence: Secondary | ICD-10-CM | POA: Insufficient documentation

## 2021-07-04 DIAGNOSIS — Z952 Presence of prosthetic heart valve: Secondary | ICD-10-CM | POA: Diagnosis not present

## 2021-07-04 DIAGNOSIS — Z7901 Long term (current) use of anticoagulants: Secondary | ICD-10-CM | POA: Diagnosis not present

## 2021-07-04 DIAGNOSIS — I4892 Unspecified atrial flutter: Secondary | ICD-10-CM | POA: Diagnosis not present

## 2021-07-04 DIAGNOSIS — Z7982 Long term (current) use of aspirin: Secondary | ICD-10-CM | POA: Diagnosis not present

## 2021-07-04 LAB — PROTIME-INR
INR: 1.7 — ABNORMAL HIGH (ref 0.8–1.2)
Prothrombin Time: 20.4 seconds — ABNORMAL HIGH (ref 11.4–15.2)

## 2021-07-04 LAB — LIPID PANEL
Cholesterol: 212 mg/dL — ABNORMAL HIGH (ref 0–200)
HDL: 82 mg/dL (ref >40)
LDL Cholesterol: 116 mg/dL — ABNORMAL HIGH (ref 0–99)
Total CHOL/HDL Ratio: 2.6 RATIO
Triglycerides: 71 mg/dL (ref <150)
VLDL: 14 mg/dL (ref 0–40)

## 2021-07-04 LAB — COMPREHENSIVE METABOLIC PANEL
ALT: 19 U/L (ref 0–44)
AST: 24 U/L (ref 15–41)
Albumin: 4 g/dL (ref 3.5–5.0)
Alkaline Phosphatase: 43 U/L (ref 38–126)
Anion gap: 5 (ref 5–15)
BUN: 12 mg/dL (ref 6–20)
CO2: 29 mmol/L (ref 22–32)
Calcium: 9.2 mg/dL (ref 8.9–10.3)
Chloride: 104 mmol/L (ref 98–111)
Creatinine, Ser: 0.96 mg/dL (ref 0.61–1.24)
GFR, Estimated: >60 mL/min (ref >60)
Glucose, Bld: 93 mg/dL (ref 70–99)
Potassium: 4 mmol/L (ref 3.5–5.1)
Sodium: 138 mmol/L (ref 135–145)
Total Bilirubin: 0.7 mg/dL (ref 0.3–1.2)
Total Protein: 6.3 g/dL — ABNORMAL LOW (ref 6.5–8.1)

## 2021-07-04 LAB — CBC
HCT: 32.9 % — ABNORMAL LOW (ref 39.0–52.0)
Hemoglobin: 10.6 g/dL — ABNORMAL LOW (ref 13.0–17.0)
MCH: 35.1 pg — ABNORMAL HIGH (ref 26.0–34.0)
MCHC: 32.2 g/dL (ref 30.0–36.0)
MCV: 108.9 fL — ABNORMAL HIGH (ref 80.0–100.0)
Platelets: 289 10*3/uL (ref 150–400)
RBC: 3.02 MIL/uL — ABNORMAL LOW (ref 4.22–5.81)
RDW: 13.2 % (ref 11.5–15.5)
WBC: 6.1 10*3/uL (ref 4.0–10.5)
nRBC: 0 % (ref 0.0–0.2)

## 2021-07-04 NOTE — Patient Instructions (Signed)
It was great to see you today! No medication changes are needed at this time.  Labs today We will only contact you if something comes back abnormal or we need to make some changes. Otherwise no news is good news!  Labs needed in 3 months and then every 3 months until follow up in one year  Your physician recommends that you schedule a follow-up appointment in: 1 year with Dr Aundra Dubin and echo  Your physician has requested that you have an echocardiogram. Echocardiography is a painless test that uses sound waves to create images of your heart. It provides your doctor with information about the size and shape of your heart and how well your hearts chambers and valves are working. This procedure takes approximately one hour. There are no restrictions for this procedure.  Do the following things EVERYDAY: Weigh yourself in the morning before breakfast. Write it down and keep it in a log. Take your medicines as prescribed Eat low salt foods--Limit salt (sodium) to 2000 mg per day.  Stay as active as you can everyday Limit all fluids for the day to less than 2 liters  At the [facility], you and your health needs are our priority. As part of our continuing mission to provide you with exceptional heart care, we have created designated Provider Care Teams. These Care Teams include your primary Cardiologist (physician) and Advanced Practice Providers (APPs- Physician Assistants and Nurse Practitioners) who all work together to provide you with the care you need, when you need it.   You may see any of the following providers on your designated Care Team at your next follow up: Dr Glori Bickers Dr Haynes Kerns, NP Lyda Jester, [place] [facility] [place], [place] Audry Riles, PharmD   Please be sure to bring in all your medications bottles to every appointment.    If you have any questions or concerns before your next appointment please send [place] a  message through [place] or call our office at [phone].    TO LEAVE A MESSAGE FOR THE NURSE SELECT OPTION 2, PLEASE LEAVE A MESSAGE INCLUDING: YOUR NAME DATE OF BIRTH CALL BACK NUMBER REASON FOR CALL**this is important as we prioritize the call backs  YOU WILL RECEIVE A CALL BACK THE SAME DAY AS LONG AS YOU CALL BEFORE 4:00 PM

## 2021-07-05 ENCOUNTER — Ambulatory Visit (INDEPENDENT_AMBULATORY_CARE_PROVIDER_SITE_OTHER): Payer: BC Managed Care – PPO

## 2021-07-05 DIAGNOSIS — Z8679 Personal history of other diseases of the circulatory system: Secondary | ICD-10-CM

## 2021-07-05 DIAGNOSIS — I359 Nonrheumatic aortic valve disorder, unspecified: Secondary | ICD-10-CM | POA: Diagnosis not present

## 2021-07-05 DIAGNOSIS — I480 Paroxysmal atrial fibrillation: Secondary | ICD-10-CM | POA: Diagnosis not present

## 2021-07-05 DIAGNOSIS — Z9889 Other specified postprocedural states: Secondary | ICD-10-CM | POA: Diagnosis not present

## 2021-07-05 DIAGNOSIS — Z954 Presence of other heart-valve replacement: Secondary | ICD-10-CM | POA: Diagnosis not present

## 2021-07-06 NOTE — Progress Notes (Signed)
Patient ID: [name] Hinton, male   DOB: [birth year], 50 y.o.   MRN: [id number] PCP: Dr. Ashby Dawes Cardiology: Dr. Aundra Dubin  [name] is a 50 y.o.with a prior history of alcohol abuse and former smoker (quit  2011). Hospitalized 1/15 with increased dyspnea-->ECHO performed and showed EF 25%, global hypokinesis, RV mildly dilated,severe aortic insufficiency.  Patient had new-onset acute systolic CHF and atrial flutter. Started on cardizem drip and Xarelto. Initially in A flutter--> became A fib.   He had TEE and DC-CV on 08/01/13.  Dr Roxy Manns provided consultation due to ascending aortic aneurysm and bicuspid valve with severe AI. He was discharged on ACEI, beta blocker, spironolactone, and xarelto. Discharge weight 150 pounds.    Had cough with lisinopril and switched to losartan.  LHC/RHC was done 2/15 showing no coronary disease and elevated PCWP with pulmonary hypertension.   In 3/15, he had replacement of ascending aortic and aortic valve with Bentall procedure with mechanical aortic valve.  He also had Maze procedure and LAA obliteration.  He was noted to have gone into atypical atrial flutter by the time of his 3/30 followup with Dr. Roxy Manns.  He was started on amiodarone and went back into NSR. He is back at work.  Echo in 4/15 showed normally functioning mechanical aortic valve, but EF remained 30-35%.   Echo in 4/15 showed normally functioning mechanical aortic valve, but EF remained 30-35%.  ECHO 01/2014  EF up to 45% with normal mechanical aortic valve.  Echo 9/17 with increase in EF to 55-60% with mean gradient 13 mmHg across mechanical AoV.    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  No other neurological symptoms.   In 3/18, he was admitted with lower GI bleeding/symptomatic anemia.  He received 2 units PRBCs.  Suspected to be hemorrhoidal.  He had a colonoscopy confirming hemorrhoids and showing 1 polyp that was removed.    Echo in 11/19 showed EF 55% with mild LVH,  normal RV size with mildly decreased systolic function, normal mechanical aortic valve. Echo in 10/21 showed EF 50-55%, normal RV, moderate biatrial enlargement, mechanical aortic valve appeared to function normally.   He returns today for followup of mechanical AoV and atrial fibrillation.  He has been doing well.  Continues to manage [employer].  No significant exertional dyspnea.  No orthopnea/PND.  He had COVID-19 twice, most recently in 11/22. BP is controlled.  Weight is up again, not getting much exercise. No BRBPR/melena.   ECG (Personally reviewed): NSR, inferolateral TWIs (no change).   Labs (1/15): K 5.2, creatinine 1.3, AST 40, ALT 58 Labs (2/15): K 4.7, creatinine 1.1 Labs (4/15): K 4.9, creatinine 1.0, LFTs normal, TSH normal Labs (5/15): K 4.6, creatinine 1.1, BNP 134 Labs (6/15): K 4.8  creatinine 1.0. INR 2.4 Labs (03/01/14): K 4.7 Creatinine 1.16  Labs (11/15): K 4.8, creatinine 0.79 Labs (3/18): K 4.4, creatinine 0.8, hgb 10.2 Labs (7/18): K 4.2, creatinine 0.86, hgb 9.8 Labs (7/21): LDL 146, K 4.4, creatinine 0.92, hgb 13.5  PMH:  1. A fib/Aflutter: Initially flutter during 1/15 admission, degenerated to fibrillation --> S/P TEE DC-CV to NSR. Atypical atrial flutter recurred post-op in 3/15. 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  2. 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  TEE (1/15) with EF 25%, moderate LV dilation, bicuspid aortic valve with severe eccentric AI and no AS, ascending aorta 5.0 cm, moderately decreased RV systolic function.  CTA chest (1/15)  with 5.1 cm ascending aortic aneurysm. Patient had Bentall replacement of ascending aorta with mechanical aortic valve and Maze with LAA obliteration in 3/15.  3. Cardiomyopathy with systolic CHF: As above, TEE with EF 25% and moderately dilated LV, moderately decreased RV systolic function.  Tachy-mediated CMP versus ETOH CMP versus long-standing AI  versus a combination of the three.  LHC/RHC (2/15): no coronary disease, mean RA 5, PA 62/25, mean PCWP 22, CI 1.8, PVR 5.2 WU, LVEDP 35 (severe AI).  Echo (2/15): EF 35-40%, moderately dilated LV, bicuspid aortic valve with severe AI.  Echo (4/15) with mildly dilated LV, mild LVH, EF 30-35%, normal RV size and systolic function, mechanical aortic valve appeared to function normally, IVC small.  Echo (7/15) with EF 45%, normal mechanical aortic valve, normal RV size with mildly decreased systolic function.  - Echo (9/17) with EF 55-60%, mechanical aortic valve with mean gradient 13 mmHg.  - Echo (11/19) with EF 55% with mild LVH, normal RV size with mildly decreased systolic function, normal mechanical aortic valve.  - Echo (10/21): EF 50-55%, normal RV, moderate biatrial enlargement, mechanical aortic valve appeared to function normally. 4. Former smoker quit 2011 5. Alcohol abuse but has cut back considerably.  6. ACEI cough 7. Palpitations: Holter monitor (12/15) with occasional PVCs and PACs.  8. Hyperlipidemia  FH: Father- Heart Failure, Grandfather CAD CABG   SH: Quit smoking 2011. Heavy ETOH until 1/15.  Engaged.  Works as Freight forwarder at [employer].   ROS: All systems negative except as listed in HPI, PMH and Problem List.  Current Outpatient Medications  Medication Sig Dispense Refill   aspirin EC 81 MG tablet Take 1 tablet (81 mg total) by mouth daily. 90 tablet 3   carvedilol (COREG) 12.5 MG tablet TAKE 1 TABLET(12.5 MG) BY MOUTH TWICE DAILY WITH A MEAL 60 tablet 1   spironolactone (ALDACTONE) 25 MG tablet TAKE 1 TABLET(25 MG) BY MOUTH DAILY 90 tablet 3   valsartan (DIOVAN) 80 MG tablet Take 1 tablet (80 mg total) by mouth 2 (two) times daily. 180 tablet 4   warfarin (COUMADIN) 5 MG tablet TAKE 1 TO 1.5 TABLETS BY MOUTH AS DIRECTED BY COUMADIN CLINIC 40 tablet 0   No current facility-administered medications for this encounter.   PHYSICAL EXAM: Vitals:   07/04/21 1528  BP: 120/78   Pulse: 77  SpO2: 100%  Weight: 83.9 kg (185 lb)   General: NAD Neck: No JVD, no thyromegaly or thyroid nodule.  Lungs: Clear to auscultation bilaterally with normal respiratory effort. CV: Nondisplaced PMI.  Heart regular S1/S2 with mechanical S2, no S3/S4, 1/6 SEM RUSB.  No peripheral edema.  No carotid bruit.  Normal pedal pulses.  Abdomen: Soft, nontender, no hepatosplenomegaly, no distention.  Skin: Intact without lesions or rashes.  Neurologic: Alert and oriented x 3.  Psych: Normal affect. Extremities: No clubbing or cyanosis.  HEENT: Normal.   ASSESSMENT & PLAN: 1. Chronic systolic CHF: Nonischemic cardiomyopathy.  Tachy-mediated CMP versus ETOH CMP versus long-standing aortic insufficiency or (most likely) a combination of all 3 possible causes.  He is no longer drinking and has had mechanical AVR.  Echo in 10/21 showed EF up to low normal, 50-55%. NYHA class I, no volume overload.   - Continue Coreg to 12.5 mg bid.  - Continue valsartan 80 mg bid. Intolerant ace due to cough.  - Continue spironolactone 25 mg daily.   - BMET today (should get at least every 3 months, will arrange).  - Repeat echo  when he follows up in 1 year unless new symptoms develop.   2. Paroxysmal atrial fibrillation/flutter: s/p TEE/DCCV 08/01/13. Had Maze/LAA obliteration with Bentall.  Post-op atypical flutter.  Remaining in NSR. No longer on amiodarone.   3. Bicuspid aortic valve disorder with severe AI and severely dilated ascending aorta (5.1 cm on CTA).  Status post mechanical AVR + ascending aorta replacement.  The valve looked good on 7/15 echo. Episode of possible amaurosis fugax in the past.  He is now on ASA 81 mg daily (which will be continued) and on warfarin with goal INR 2.5-3.5. INR managed [facility].  - Reinforced need for SBE prophylaxis - CBC and INR today, send INR to coumadin clinic.  4. ETOH abuse: Occasional ETOH, not heavy as in the past.   5. HTN: Stable. Continue current  regimen. 6. Hyperlipidemia: Check lipids today.   Followup 1 year with echo.   [name] 07/06/2021

## 2021-07-08 NOTE — Patient Instructions (Signed)
Description   Called spoke with pt 07/08/21 INR checked at HF clinic on 07/05/21 INR 1.7, advised pt to take 1.5 tablets today and tomorrow, then resume same dosage 1 tablet daily except 1.5 on Mondays. Recheck INR in 2 weeks. Coumadin Clinic [phone], call with any changes in medication or up coming procedure.

## 2021-07-10 ENCOUNTER — Other Ambulatory Visit: Payer: Self-pay

## 2021-07-10 MED ORDER — WARFARIN SODIUM 5 MG PO TABS: ORAL_TABLET | ORAL | 0 refills | Status: DC

## 2021-07-10 NOTE — Telephone Encounter (Signed)
Prescription refill request received for warfarin Lov: 07/04/21 Aundra Dubin)  Next INR check: 07/22/21 Warfarin tablet strength: 5mg   Appropriate dose and refill sent to requested pharmacy.

## 2021-07-23 ENCOUNTER — Ambulatory Visit (INDEPENDENT_AMBULATORY_CARE_PROVIDER_SITE_OTHER): Payer: BLUE CROSS/BLUE SHIELD

## 2021-07-23 ENCOUNTER — Other Ambulatory Visit: Payer: Self-pay

## 2021-07-23 DIAGNOSIS — Z8679 Personal history of other diseases of the circulatory system: Secondary | ICD-10-CM

## 2021-07-23 DIAGNOSIS — I480 Paroxysmal atrial fibrillation: Secondary | ICD-10-CM

## 2021-07-23 DIAGNOSIS — I351 Nonrheumatic aortic (valve) insufficiency: Secondary | ICD-10-CM | POA: Diagnosis not present

## 2021-07-23 DIAGNOSIS — Z5181 Encounter for therapeutic drug level monitoring: Secondary | ICD-10-CM

## 2021-07-23 DIAGNOSIS — I359 Nonrheumatic aortic valve disorder, unspecified: Secondary | ICD-10-CM | POA: Diagnosis not present

## 2021-07-23 DIAGNOSIS — Z9889 Other specified postprocedural states: Secondary | ICD-10-CM | POA: Diagnosis not present

## 2021-07-23 DIAGNOSIS — Z954 Presence of other heart-valve replacement: Secondary | ICD-10-CM

## 2021-07-23 LAB — POCT INR: INR: 2.1 (ref 2.0–3.0)

## 2021-07-23 NOTE — Patient Instructions (Signed)
-   take extra 1/2 tablet tonight, then - START NEW DOSAGE of warfarin of 1 tablet daily except 1.5 on Mondays & Fridays. -  Recheck INR in 2 weeks.  Coumadin Clinic [phone], call with any changes in medication or up coming procedure.

## 2021-08-06 ENCOUNTER — Other Ambulatory Visit: Payer: Self-pay | Admitting: Cardiology

## 2021-08-06 ENCOUNTER — Ambulatory Visit (INDEPENDENT_AMBULATORY_CARE_PROVIDER_SITE_OTHER): Payer: BC Managed Care – PPO | Admitting: *Deleted

## 2021-08-06 ENCOUNTER — Other Ambulatory Visit: Payer: Self-pay

## 2021-08-06 DIAGNOSIS — Z954 Presence of other heart-valve replacement: Secondary | ICD-10-CM | POA: Diagnosis not present

## 2021-08-06 DIAGNOSIS — Z5181 Encounter for therapeutic drug level monitoring: Secondary | ICD-10-CM

## 2021-08-06 DIAGNOSIS — Z8679 Personal history of other diseases of the circulatory system: Secondary | ICD-10-CM | POA: Diagnosis not present

## 2021-08-06 DIAGNOSIS — I359 Nonrheumatic aortic valve disorder, unspecified: Secondary | ICD-10-CM

## 2021-08-06 DIAGNOSIS — I480 Paroxysmal atrial fibrillation: Secondary | ICD-10-CM

## 2021-08-06 DIAGNOSIS — I351 Nonrheumatic aortic (valve) insufficiency: Secondary | ICD-10-CM

## 2021-08-06 DIAGNOSIS — Z9889 Other specified postprocedural states: Secondary | ICD-10-CM

## 2021-08-06 LAB — POCT INR: INR: 2.4 (ref 2.0–3.0)

## 2021-08-06 NOTE — Patient Instructions (Signed)
Description   -Start taking warfarin 1 tablet daily except for 1.5 tablets on Monday, Wednesday and Friday.  -  Recheck INR in 2 weeks.  [facility]in Clinic [phone], call with any changes in medication or up coming procedure.

## 2021-08-06 NOTE — Telephone Encounter (Signed)
Pt pending appt today at 330pm; will evaluate for refill at visit due to history of noncompliant with appts.

## 2021-08-20 ENCOUNTER — Ambulatory Visit (INDEPENDENT_AMBULATORY_CARE_PROVIDER_SITE_OTHER): Payer: BC Managed Care – PPO

## 2021-08-20 ENCOUNTER — Other Ambulatory Visit: Payer: Self-pay

## 2021-08-20 DIAGNOSIS — Z8679 Personal history of other diseases of the circulatory system: Secondary | ICD-10-CM

## 2021-08-20 DIAGNOSIS — I359 Nonrheumatic aortic valve disorder, unspecified: Secondary | ICD-10-CM

## 2021-08-20 DIAGNOSIS — Z5181 Encounter for therapeutic drug level monitoring: Secondary | ICD-10-CM | POA: Diagnosis not present

## 2021-08-20 DIAGNOSIS — I480 Paroxysmal atrial fibrillation: Secondary | ICD-10-CM | POA: Diagnosis not present

## 2021-08-20 DIAGNOSIS — Z9889 Other specified postprocedural states: Secondary | ICD-10-CM | POA: Diagnosis not present

## 2021-08-20 DIAGNOSIS — I351 Nonrheumatic aortic (valve) insufficiency: Secondary | ICD-10-CM

## 2021-08-20 DIAGNOSIS — Z954 Presence of other heart-valve replacement: Secondary | ICD-10-CM

## 2021-08-20 LAB — POCT INR: INR: 3.9 — AB (ref 2.0–3.0)

## 2021-08-20 NOTE — Patient Instructions (Signed)
Description   Skip tomorrow's dosage of Warfarin, then start taking  warfarin 1 tablet daily except for 1.5 tablets on Mondays and Fridays.  Recheck INR in 2 weeks. Coumadin Clinic [phone], call with any changes in medication or up coming procedure.

## 2021-09-09 ENCOUNTER — Ambulatory Visit (INDEPENDENT_AMBULATORY_CARE_PROVIDER_SITE_OTHER): Payer: BC Managed Care – PPO

## 2021-09-09 DIAGNOSIS — Z5181 Encounter for therapeutic drug level monitoring: Secondary | ICD-10-CM

## 2021-09-09 DIAGNOSIS — I351 Nonrheumatic aortic (valve) insufficiency: Secondary | ICD-10-CM | POA: Diagnosis not present

## 2021-09-09 DIAGNOSIS — Z9889 Other specified postprocedural states: Secondary | ICD-10-CM | POA: Diagnosis not present

## 2021-09-09 DIAGNOSIS — I480 Paroxysmal atrial fibrillation: Secondary | ICD-10-CM | POA: Diagnosis not present

## 2021-09-09 DIAGNOSIS — Z954 Presence of other heart-valve replacement: Secondary | ICD-10-CM

## 2021-09-09 DIAGNOSIS — Z8679 Personal history of other diseases of the circulatory system: Secondary | ICD-10-CM

## 2021-09-09 DIAGNOSIS — I359 Nonrheumatic aortic valve disorder, unspecified: Secondary | ICD-10-CM | POA: Diagnosis not present

## 2021-09-09 LAB — POCT INR: INR: 2.9 (ref 2.0–3.0)

## 2021-09-09 NOTE — Patient Instructions (Signed)
Description   Continue on same dosage of Warfarin 1 tablet daily except for 1.5 tablets on Mondays and Fridays.  Recheck INR in 3 weeks. Cou[facility]in Clinic [phone], call with any changes in medication or up coming procedure.

## 2021-09-27 ENCOUNTER — Other Ambulatory Visit: Payer: Self-pay

## 2021-09-27 MED ORDER — WARFARIN SODIUM 5 MG PO TABS: ORAL_TABLET | ORAL | 0 refills | Status: DC

## 2021-09-30 ENCOUNTER — Ambulatory Visit (INDEPENDENT_AMBULATORY_CARE_PROVIDER_SITE_OTHER): Payer: BC Managed Care – PPO

## 2021-09-30 ENCOUNTER — Other Ambulatory Visit: Payer: Self-pay

## 2021-09-30 DIAGNOSIS — I351 Nonrheumatic aortic (valve) insufficiency: Secondary | ICD-10-CM | POA: Diagnosis not present

## 2021-09-30 DIAGNOSIS — I359 Nonrheumatic aortic valve disorder, unspecified: Secondary | ICD-10-CM

## 2021-09-30 DIAGNOSIS — I480 Paroxysmal atrial fibrillation: Secondary | ICD-10-CM | POA: Diagnosis not present

## 2021-09-30 DIAGNOSIS — Z954 Presence of other heart-valve replacement: Secondary | ICD-10-CM

## 2021-09-30 DIAGNOSIS — Z9889 Other specified postprocedural states: Secondary | ICD-10-CM

## 2021-09-30 DIAGNOSIS — Z8679 Personal history of other diseases of the circulatory system: Secondary | ICD-10-CM

## 2021-09-30 DIAGNOSIS — Z5181 Encounter for therapeutic drug level monitoring: Secondary | ICD-10-CM

## 2021-09-30 LAB — POCT INR: INR: 2 (ref 2.0–3.0)

## 2021-09-30 MED ORDER — WARFARIN SODIUM 5 MG PO TABS: ORAL_TABLET | ORAL | 1 refills | Status: DC

## 2021-09-30 NOTE — Patient Instructions (Signed)
Description   ?Take 2 tablets today and then take 1.5 tablets tomorrow, then resume same dosage of Warfarin 1 tablet daily except for 1.5 tablets on Mondays and Fridays.  Recheck INR in 3 weeks. Cou[facility]in Clinic [phone], call with any changes in medication or up coming procedure.  ?  ?  ?

## 2021-10-02 ENCOUNTER — Other Ambulatory Visit (HOSPITAL_COMMUNITY): Payer: BC Managed Care – PPO

## 2021-10-14 ENCOUNTER — Other Ambulatory Visit (HOSPITAL_COMMUNITY): Payer: BC Managed Care – PPO

## 2021-10-17 ENCOUNTER — Other Ambulatory Visit (HOSPITAL_COMMUNITY): Payer: Self-pay

## 2021-10-17 MED ORDER — CARVEDILOL 12.5 MG PO TABS: ORAL_TABLET | ORAL | 11 refills | Status: DC

## 2021-10-21 ENCOUNTER — Ambulatory Visit (HOSPITAL_COMMUNITY)
Admission: RE | Admit: 2021-10-21 | Discharge: 2021-10-21 | Disposition: A | Discharge status: Home / Self Care | Payer: BC Managed Care – PPO | Source: Ambulatory Visit | Attending: Internal Medicine | Admitting: Internal Medicine

## 2021-10-21 DIAGNOSIS — I5022 Chronic systolic (congestive) heart failure: Secondary | ICD-10-CM | POA: Diagnosis not present

## 2021-10-21 LAB — BASIC METABOLIC PANEL
Anion gap: 8 (ref 5–15)
BUN: 12 mg/dL (ref 6–20)
CO2: 26 mmol/L (ref 22–32)
Calcium: 9.3 mg/dL (ref 8.9–10.3)
Chloride: 103 mmol/L (ref 98–111)
Creatinine, Ser: 1 mg/dL (ref 0.61–1.24)
GFR, Estimated: >60 mL/min (ref >60)
Glucose, Bld: 104 mg/dL — ABNORMAL HIGH (ref 70–99)
Potassium: 4 mmol/L (ref 3.5–5.1)
Sodium: 137 mmol/L (ref 135–145)

## 2021-11-13 ENCOUNTER — Telehealth (HOSPITAL_COMMUNITY): Payer: Self-pay | Admitting: *Deleted

## 2021-11-13 NOTE — Telephone Encounter (Signed)
Pt called stating he felt like he was back in afib and asked to come in fir ekg. Per Dr.McLean schedule pt for a nurse visit to check ekg. Pt scheduled.  ?

## 2021-11-14 ENCOUNTER — Other Ambulatory Visit (HOSPITAL_COMMUNITY): Payer: Self-pay

## 2021-11-14 ENCOUNTER — Encounter (HOSPITAL_COMMUNITY): Payer: Self-pay | Admitting: *Deleted

## 2021-11-14 ENCOUNTER — Telehealth (HOSPITAL_COMMUNITY): Payer: Self-pay | Admitting: *Deleted

## 2021-11-14 ENCOUNTER — Ambulatory Visit (HOSPITAL_COMMUNITY)
Admission: RE | Admit: 2021-11-14 | Discharge: 2021-11-14 | Disposition: A | Discharge status: Home / Self Care | Payer: BC Managed Care – PPO | Source: Ambulatory Visit | Attending: Cardiology | Admitting: Cardiology

## 2021-11-14 VITALS — BP 126/74 | HR 139

## 2021-11-14 DIAGNOSIS — I480 Paroxysmal atrial fibrillation: Secondary | ICD-10-CM | POA: Diagnosis not present

## 2021-11-14 LAB — CBC
HCT: 40.9 % (ref 39.0–52.0)
Hemoglobin: 13.4 g/dL (ref 13.0–17.0)
MCH: 34.6 pg — ABNORMAL HIGH (ref 26.0–34.0)
MCHC: 32.8 g/dL (ref 30.0–36.0)
MCV: 105.7 fL — ABNORMAL HIGH (ref 80.0–100.0)
Platelets: 260 10*3/uL (ref 150–400)
RBC: 3.87 MIL/uL — ABNORMAL LOW (ref 4.22–5.81)
RDW: 14.1 % (ref 11.5–15.5)
WBC: 11.6 10*3/uL — ABNORMAL HIGH (ref 4.0–10.5)
nRBC: 0 % (ref 0.0–0.2)

## 2021-11-14 LAB — COMPREHENSIVE METABOLIC PANEL
ALT: 21 U/L (ref 0–44)
AST: 27 U/L (ref 15–41)
Albumin: 4.1 g/dL (ref 3.5–5.0)
Alkaline Phosphatase: 53 U/L (ref 38–126)
Anion gap: 7 (ref 5–15)
BUN: 10 mg/dL (ref 6–20)
CO2: 27 mmol/L (ref 22–32)
Calcium: 9.2 mg/dL (ref 8.9–10.3)
Chloride: 106 mmol/L (ref 98–111)
Creatinine, Ser: 0.89 mg/dL (ref 0.61–1.24)
GFR, Estimated: >60 mL/min (ref >60)
Glucose, Bld: 114 mg/dL — ABNORMAL HIGH (ref 70–99)
Potassium: 4 mmol/L (ref 3.5–5.1)
Sodium: 140 mmol/L (ref 135–145)
Total Bilirubin: 0.9 mg/dL (ref 0.3–1.2)
Total Protein: 6.5 g/dL (ref 6.5–8.1)

## 2021-11-14 LAB — PROTIME-INR
INR: 2.3 — ABNORMAL HIGH (ref 0.8–1.2)
Prothrombin Time: 24.8 seconds — ABNORMAL HIGH (ref 11.4–15.2)

## 2021-11-14 LAB — MAGNESIUM: Magnesium: 2.2 mg/dL (ref 1.7–2.4)

## 2021-11-14 LAB — TSH: TSH: 1.149 u[IU]/mL (ref 0.350–4.500)

## 2021-11-14 NOTE — Anesthesia Preprocedure Evaluation (Addendum)
Anesthesia Evaluation  ?Patient identified by MRN, date of birth, ID band ?Patient awake ? ? ? ?Reviewed: ?Allergy & Precautions, NPO status , Patient's Chart, lab work & pertinent test results, reviewed documented beta blocker date and time  ? ?History of Anesthesia Complications ?Negative for: history of anesthetic complications ? ?Airway ?Mallampati: II ? ?TM Distance: >3 FB ?Neck ROM: Full ? ? ? Dental ?no notable dental hx. ? ?  ?Pulmonary ?Current Smoker,  ?  ?Pulmonary exam normal ? ? ? ? ? ? ? Cardiovascular ?hypertension, Pt. on medications and Pt. on home beta blockers ?+CHF  ?Normal cardiovascular exam+ dysrhythmias Atrial Fibrillation  ? ?S/P Bentall 2015 ? ?TTE 2021: distal septal hypokinesis, EF 50-55%, moderate LAE/RAE, mechanical AVR with normal function   ?  ?Neuro/Psych ?negative neurological ROS ? negative psych ROS  ? GI/Hepatic ?negative GI ROS, (+)  ?  ? substance abuse ? alcohol use,   ?Endo/Other  ?negative endocrine ROS ? Renal/GU ?negative Renal ROS  ?negative genitourinary ?  ?Musculoskeletal ?negative musculoskeletal ROS ?(+)  ? Abdominal ?  ?Peds ? Hematology ?negative hematology ROS ?(+)   ?Anesthesia Other Findings ?Day of surgery medications reviewed with patient. ? Reproductive/Obstetrics ?negative OB ROS ? ?  ? ? ? ? ? ? ? ? ? ? ? ? ? ?  ?  ? ? ? ? ? ? ? ?Anesthesia Physical ?Anesthesia Plan ? ?ASA: 3 ? ?Anesthesia Plan: MAC  ? ?Post-op Pain Management: Minimal or no pain anticipated  ? ?Induction:  ? ?PONV Risk Score and Plan: Treatment may vary due to age or medical condition and Propofol infusion ? ?Airway Management Planned: Natural Airway and Nasal Cannula ? ?Additional Equipment: None ? ?Intra-op Plan:  ? ?Post-operative Plan:  ? ?Informed Consent: I have reviewed the patients History and Physical, chart, labs and discussed the procedure including the risks, benefits and alternatives for the proposed anesthesia with the patient or authorized  representative who has indicated his/her understanding and acceptance.  ? ? ? ? ? ?Plan Discussed with: CRNA ? ?Anesthesia Plan Comments:   ? ? ? ? ? ?Anesthesia Quick Evaluation ? ?

## 2021-11-14 NOTE — [place] Notes (Signed)
Patient seen for nurse visit/elg. Pt c/o rapid heart rate and dizziness. Ekg showed aflutter. Per Dr.McLean get lab CBC, CMET, TSH, INR, MG and schedule tee/dccv for tomorrow. Pt aware and agreeable. Pt scheduled and we reviewed instruction sheet. Pt also has printed copy of instructions.  ?

## 2021-11-15 ENCOUNTER — Ambulatory Visit (HOSPITAL_COMMUNITY)
Admission: RE | Admit: 2021-11-15 | Discharge: 2021-11-15 | Disposition: A | Discharge status: Home / Self Care | Payer: BC Managed Care – PPO | Source: Ambulatory Visit | Attending: Cardiology | Admitting: Cardiology

## 2021-11-15 ENCOUNTER — Ambulatory Visit (HOSPITAL_COMMUNITY): Payer: BC Managed Care – PPO | Admitting: Anesthesiology

## 2021-11-15 ENCOUNTER — Ambulatory Visit (HOSPITAL_BASED_OUTPATIENT_CLINIC_OR_DEPARTMENT_OTHER)
Admission: RE | Admit: 2021-11-15 | Discharge: 2021-11-15 | Disposition: A | Discharge status: Home / Self Care | Payer: BC Managed Care – PPO | Source: Ambulatory Visit | Attending: Cardiology | Admitting: Cardiology

## 2021-11-15 ENCOUNTER — Encounter (HOSPITAL_COMMUNITY): Payer: Self-pay | Admitting: Cardiology

## 2021-11-15 ENCOUNTER — Encounter (HOSPITAL_COMMUNITY)
Admission: RE | Disposition: A | Discharge status: Home / Self Care | Payer: Self-pay | Source: Ambulatory Visit | Attending: Cardiology

## 2021-11-15 ENCOUNTER — Other Ambulatory Visit: Payer: Self-pay

## 2021-11-15 DIAGNOSIS — I5020 Unspecified systolic (congestive) heart failure: Secondary | ICD-10-CM | POA: Insufficient documentation

## 2021-11-15 DIAGNOSIS — I509 Heart failure, unspecified: Secondary | ICD-10-CM | POA: Diagnosis not present

## 2021-11-15 DIAGNOSIS — I110 Hypertensive heart disease with heart failure: Secondary | ICD-10-CM | POA: Diagnosis not present

## 2021-11-15 DIAGNOSIS — I480 Paroxysmal atrial fibrillation: Secondary | ICD-10-CM

## 2021-11-15 DIAGNOSIS — I484 Atypical atrial flutter: Secondary | ICD-10-CM | POA: Insufficient documentation

## 2021-11-15 DIAGNOSIS — Z952 Presence of prosthetic heart valve: Secondary | ICD-10-CM | POA: Insufficient documentation

## 2021-11-15 DIAGNOSIS — I4891 Unspecified atrial fibrillation: Secondary | ICD-10-CM | POA: Diagnosis not present

## 2021-11-15 DIAGNOSIS — F17210 Nicotine dependence, cigarettes, uncomplicated: Secondary | ICD-10-CM | POA: Diagnosis not present

## 2021-11-15 DIAGNOSIS — I4892 Unspecified atrial flutter: Secondary | ICD-10-CM

## 2021-11-15 HISTORY — PX: TEE WITHOUT CARDIOVERSION: SHX5443

## 2021-11-15 HISTORY — PX: CARDIOVERSION: SHX1299

## 2021-11-15 SURGERY — ECHOCARDIOGRAM, TRANSESOPHAGEAL
Anesthesia: Monitor Anesthesia Care

## 2021-11-15 MED ORDER — SODIUM CHLORIDE 0.9 % IV SOLN: INTRAVENOUS | Status: DC

## 2021-11-15 MED ORDER — LIDOCAINE 2% (20 MG/ML) 5 ML SYRINGE
INTRAMUSCULAR | Status: DC | PRN
  Administered 2021-11-15: 100 mg via INTRAVENOUS

## 2021-11-15 MED ORDER — SODIUM CHLORIDE 0.9 % IV SOLN: INTRAVENOUS | Status: DC | PRN

## 2021-11-15 MED ORDER — PHENYLEPHRINE 80 MCG/ML (10ML) SYRINGE FOR IV PUSH (FOR BLOOD PRESSURE SUPPORT)
PREFILLED_SYRINGE | INTRAVENOUS | Status: DC | PRN
  Administered 2021-11-15 (×4): 160 ug via INTRAVENOUS

## 2021-11-15 MED ORDER — PROPOFOL 10 MG/ML IV BOLUS
INTRAVENOUS | Status: DC | PRN
  Administered 2021-11-15 (×3): 10 mg via INTRAVENOUS

## 2021-11-15 MED ORDER — PROPOFOL 500 MG/50ML IV EMUL
INTRAVENOUS | Status: DC | PRN
  Administered 2021-11-15: 100 ug/kg/min via INTRAVENOUS

## 2021-11-15 NOTE — H&P (Signed)
? ?Advanced Heart Failure Team History and Physical Note ?  ?PCP:  Merrilee Seashore, MD  ?PCP-Cardiology: None    ? ?Reason for Admission: atrial flutter ? ? ?HPI:   ? ?Patient developed atypical atrial flutter with RVR and palpitations, presented to clinic.  INR 2.3 yesterday evening. He comes today for TEE-DCCV.  ? ? ? ?Review of Systems: All systems reviewed and negative except as per HPI.  ? ? ?Home Medications ?Prior to Admission medications   ?Medication Sig Start Date End Date Taking? Authorizing Provider  ?aspirin EC 81 MG tablet Take 1 tablet (81 mg total) by mouth daily. 10/15/18  Yes [name], MD  ?carvedilol (COREG) 12.5 MG tablet TAKE 1 TABLET(12.5 MG) BY MOUTH TWICE DAILY WITH A MEAL 10/17/21  Yes [name], MD  ?Ferrous Sulfate (IRON PO) Take 130 mg by mouth daily.   Yes [provider]  ?spironolactone (ALDACTONE) 25 MG tablet TAKE 1 TABLET(25 MG) BY MOUTH DAILY 10/29/20  Yes [name], MD  ?valsartan (DIOVAN) 80 MG tablet Take 1 tablet (80 mg total) by mouth 2 (two) times daily. 11/06/20  Yes [name], MD  ?warfarin (COUMADIN) 5 MG tablet TAKE 1 TO 1.5 TABLETS BY MOUTH ONCE DAILY AS DIRECTED BY COUMADIN CLINIC ?Patient taking differently: Take 5-7.5 mg by mouth See admin instructions. TAKE 1 TO 1.5 TABLETS BY MOUTH ONCE DAILY AS DIRECTED BY COUMADIN CLINIC; 7.5 mg on Mon and Fri, 5 mg all other days 09/30/21  Yes [name], MD  ? ? ?Past Medical History: ?Past Medical History:  ?Diagnosis Date  ? Dysrhythmia   ? ETOH abuse   ? Heart murmur   ? Hx of repair of ascending aorta 09/27/2013  ? Non-ischemic cardiomyopathy ([place])   ? PAF (paroxysmal atrial fibrillation) ([place])   ? a. Dx 07/2013, s/p TEE/DCCV.  ? Paroxysmal atrial flutter ([place])   ? a. Dx 07/2013.  ? 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 29 mm Sorin Carbomedics Carbo-seal Valsalve mechanical valve conduit  ? S/P Maze operation for atrial  fibrillation 09/27/2013  ? Complete bilateral atrial lesion set using bipolar radiofrequency and cryothermy ablation with clipping of LA appendage  ? Severe aortic insufficiency   ? a. Bicuspid aortic valve with severe AI, dilated ascending aorta dx 07/2013.  ? Systolic CHF ([place])   ? a. Dx 07/2013: EF 25%, felt likely related to combo of EtOH + tachy-mediated + AI. LHC pending.  ? ? ?Past Surgical History: ?Past Surgical History:  ?Procedure Laterality Date  ? ASCENDING AORTIC ROOT REPLACEMENT N/A 09/27/2013  ? Procedure: ASCENDING AORTIC ROOT REPLACEMENT;  Surgeon: [name], MD;  Location: [place];  Service: Open Heart Surgery;  Laterality: N/A;  ? BENTALL PROCEDURE N/A 09/27/2013  ? Procedure: BENTALL PROCEDURE;  Surgeon: [name], MD;  Location: [place];  Service: Open Heart Surgery;  Laterality: N/A;  ? CARDIAC CATHETERIZATION    ? CARDIOVERSION N/A 08/01/2013  ? Procedure: CARDIOVERSION;  Surgeon: [name], MD;  Location: [place];  Service: Cardiovascular;  Laterality: N/A;  ? COLONOSCOPY WITH PROPOFOL Left 10/10/2016  ? Procedure: COLONOSCOPY WITH PROPOFOL;  Surgeon: Arta Silence, MD;  Location: [facility] ENDOSCOPY;  Service: Endoscopy;  Laterality: Left;  ? INTRAOPERATIVE TRANSESOPHAGEAL ECHOCARDIOGRAM N/A 09/27/2013  ? Procedure: INTRAOPERATIVE TRANSESOPHAGEAL ECHOCARDIOGRAM;  Surgeon: [name], MD;  Location: [place];  Service: Open Heart Surgery;  Laterality: N/A;  ? LEFT AND  RIGHT HEART CATHETERIZATION WITH CORONARY ANGIOGRAM N/A 09/05/2013  ? Procedure: LEFT AND RIGHT HEART CATHETERIZATION WITH CORONARY ANGIOGRAM;  Surgeon: [name], MD;  Location: [facility] C[facility] LAB;  Service: Cardiovascular;  Laterality: N/A;  ? MAZE N/A 09/27/2013  ? Procedure: MAZE;  Surgeon: [name], MD;  Location: [place];  Service: Open Heart Surgery;  Laterality: N/A;  ? NO PAST SURGERIES    ? TEE WITHOUT CARDIOVERSION N/A 08/01/2013  ? Procedure: TRANSESOPHAGEAL ECHOCARDIOGRAM (TEE);  Surgeon: [name],  MD;  Location: [place];  Service: Cardiovascular;  Laterality: N/A;  ? ? ?Family History:  ?Family History  ?Problem Relation Age of Onset  ? Heart failure Father   ? Coronary artery disease Father   ?     CABG  ? ? ?Social History: ?Social History  ? ?Socioeconomic History  ? Marital status: Married  ?  Spouse name: Not on file  ? Number of children: Not on file  ? Years of education: Not on file  ? Highest education level: Not on file  ?Occupational History  ? Occupation: Restaurant work at [employer]  ?Tobacco Use  ? Smoking status: Every Day  ?  Packs/day: 0.50  ?  Years: 20.00  ?  Pack years: 10.00  ?  Types: Cigarettes  ?  Last attempt to quit: 09/25/2009  ?  Years since quitting: 12.1  ? Smokeless tobacco: Never  ?Vaping Use  ? Vaping Use: Never used  ?Substance and Sexual Activity  ? Alcohol use: Yes  ?  Comment: [place] Every Night   ? Drug use: No  ? Sexual activity: Not on file  ?Other Topics Concern  ? Not on file  ?Social History Narrative  ? Lives with wife.  ? ?Social Determinants of Health  ? ?Financial Resource Strain: Not on file  ?Food Insecurity: Not on file  ?Transportation Needs: Not on file  ?Physical Activity: Not on file  ?Stress: Not on file  ?Social Connections: Not on file  ? ? ?Allergies:  ?Allergies  ?Allergen Reactions  ? Morphine And Related Nausea Only  ? ? ?Objective:   ? ?Vital Signs:   ?Temp:  [97.8 ?F (36.6 ?C)] 97.8 ?F (36.6 ?C) (04/28 7371) ?Pulse Rate:  [140] 140 (04/28 0748) ?Resp:  [18] 18 (04/28 0748) ?BP: (115)/(84) 115/84 (04/28 0748) ?SpO2:  [97 %] 97 % (04/28 0748) ?Weight:  [77.1 kg] 77.1 kg (04/28 0748) ?  ?Filed Weights  ? 11/15/21 0748  ?Weight: 77.1 kg  ? ? ? ?Physical Exam   ?  ?General:  Well appearing. No respiratory difficulty ?HEENT: Normal ?Neck: Supple. no JVD. Carotids 2+ bilat; no bruits. No lymphadenopathy or thyromegaly appreciated. ?Cor: PMI nondisplaced. Tachy, regular rate & rhythm. No rubs, gallops or murmurs. Mechanical S2 ?Lungs: Clear ?Abdomen:  Soft, nontender, nondistended. No hepatosplenomegaly. No bruits or masses. Good bowel sounds. ?Extremities: No cyanosis, clubbing, rash, edema ?Neuro: Alert & oriented x 3, cranial nerves grossly intact. moves all 4 extremities w/o difficulty. Affect pleasant. ? ? ?Telemetry  ? ?Atypical atrial flutter rate 130s ? ? ?Labs  ?  ? ?Basic Metabolic Panel: ?Recent Labs  ?Lab 11/14/21 ?1503  ?NA 140  ?K 4.0  ?CL 106  ?CO2 27  ?GLUCOSE 114*  ?BUN 10  ?CREATININE 0.89  ?CALCIUM 9.2  ?MG 2.2  ? ? ?Liver Function Tests: ?Recent Labs  ?Lab 11/14/21 ?1503  ?AST 27  ?ALT 21  ?ALKPHOS 53  ?BILITOT 0.9  ?PROT 6.5  ?ALBUMIN 4.1  ? ?  No results for input(s): LIPASE, AMYLASE in the last 168 hours. ?No results for input(s): AMMONIA in the last 168 hours. ? ?CBC: ?Recent Labs  ?Lab 11/14/21 ?1503  ?WBC 11.6*  ?HGB 13.4  ?HCT 40.9  ?MCV 105.7*  ?PLT 260  ? ? ?Cardiac Enzymes: ?No results for input(s): CKTOTAL, CKMB, CKMBINDEX, TROPONINI in the last 168 hours. ? ?BNP: ?BNP (last 3 results) ?No results for input(s): BNP in the last 8760 hours. ? ?ProBNP (last 3 results) ?No results for input(s): PROBNP in the last 8760 hours. ? ? ?CBG: ?No results for input(s): GLUCAP in the last 168 hours. ? ?Coagulation Studies: ?Recent Labs  ?  11/14/21 ?1503  ?LABPROT 24.8*  ?INR 2.3*  ? ? ?Imaging: ?No results found. ? ? ?Assessment/Plan  ? ?Will proceed with TEE-guided DCCV. Will need followup in office.  ? ? ?[name], MD ?11/15/2021, 8:24 AM ? ?Advanced Heart Failure Team ?Pager [phone] (M-F; 7a - 5p)  ?Please contact [facility] for night-coverage after hours (4p -7a ) and weekends on [email or web address] ?  ? ?

## 2021-11-15 NOTE — Anesthesia Procedure Notes (Signed)
Procedure Name: [place] ?Date/Time: 11/15/2021 7:56 AM ?Performed by: Harden Mo, CRNA ?Pre-anesthesia Checklist: Patient identified, Emergency Drugs available, Suction available and Patient being monitored ?Patient Re-evaluated:Patient Re-evaluated prior to induction ?Oxygen Delivery Method: Nasal cannula ?Preoxygenation: Pre-oxygenation with 100% oxygen ?Induction Type: IV induction ?Placement Confirmation: positive ETCO2 and breath sounds checked- equal and bilateral ?Dental Injury: Teeth and Oropharynx as per pre-operative assessment  ? ? ? ? ?

## 2021-11-15 NOTE — Anesthesia Postprocedure Evaluation (Signed)
Anesthesia Post Note ? ?Patient: [name] Hinton ? ?Procedure(s) Performed: TRANSESOPHAGEAL ECHOCARDIOGRAM (TEE) ?CARDIOVERSION ? ?  ? ?Patient location during evaluation: PACU ?Anesthesia Type: MAC ?Level of consciousness: awake and alert ?Pain management: pain level controlled ?Vital Signs Assessment: post-procedure vital signs reviewed and stable ?Respiratory status: spontaneous breathing, nonlabored ventilation and respiratory function stable ?Cardiovascular status: blood pressure returned to baseline ?Postop Assessment: no apparent nausea or vomiting ?Anesthetic complications: no ? ? ?No notable events documented. ? ?Last Vitals:  ?Vitals:  ? 11/15/21 0903 11/15/21 0906  ?BP: 94/63 102/68  ?Pulse: 76 76  ?Resp: 16 16  ?Temp:  37.2 ?C  ?SpO2: 95% 96%  ?  ?Last Pain:  ?Vitals:  ? 11/15/21 0906  ?TempSrc:   ?PainSc: 0-No pain  ? ? ?  ?  ?  ?  ?  ?  ? ?Marthenia Rolling ? ? ? ? ?

## 2021-11-15 NOTE — Progress Notes (Signed)
?  November 15, 2021 ? ?Patient: [name] Hinton  ?Date of Birth: 1971/05/09  ?Date of Visit: 11/14/2021  ? ? ?To Whom It May Concern: ? ?[name] was seen and treated in our endoscopy department or on 11/14/2021. TRISTEN LUCE  may return to work on 11/17/2021 per MD Aundra Dubin. ? ?Sincerely,  ?Jeanella Cara, RN  ? ?  ? ? ? ? ?

## 2021-11-15 NOTE — CV Procedure (Signed)
Procedure: TEE ? ?Sedation: Per anesthesiology ? ?Indication: Atypical atrial flutter.  ? ?Findings: Please see echo section for full report.  Normal LV size and wall thickness.  EF 50-55% range, no regional WMAs.  Normal RV size and systolic function.  Mild left atrial enlargement, no right atrial enlargement.  The LA appendage has been ligated, no thrombus in the LA.  No PFO/ASD by color doppler.  No significant TR.  Trivial MR.  Mechanical aortic valve without significant regurgitation, mean gradient 7 mmHg.  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  Normal caliber thoracic aorta with minimal plaque.  ? ?Impression: Proceed to DCCV.  ? ?[name] Hinton ?11/15/2021 ?8:28 AM ? ?

## 2021-11-15 NOTE — Procedures (Signed)
Electrical Cardioversion Procedure Note ?[name] Hinton ?[id number] ?Jan 02, 1971 ? ?Procedure: Electrical Cardioversion ?Indications:  Atrial Flutter ? ?Procedure Details ?Consent: Risks of procedure as well as the alternatives and risks of each were explained to the (patient/caregiver).  Consent for procedure obtained. ?Time Out: Verified patient identification, verified procedure, site/side was marked, verified correct patient position, special equipment/implants available, medications/allergies/relevent history reviewed, required imaging and test results available.  Performed ? ?Patient placed on cardiac monitor, pulse oximetry, supplemental oxygen as necessary.  ?Sedation given:  Propofol per anesthesiology ?Pacer pads placed anterior and posterior chest. ? ?Cardioverted 1 time(s).  ?Cardioverted at 200J. ? ?Evaluation ?Findings: Post procedure EKG shows: NSR ?Complications: None ?Patient did tolerate procedure well. ? ? ?[name] ?11/15/2021, 8:29 AM ? ? ? ?

## 2021-11-15 NOTE — Transfer of Care (Signed)
Immediate Anesthesia Transfer of Care Note ? ?Patient: [name] Hinton ? ?Procedure(s) Performed: TRANSESOPHAGEAL ECHOCARDIOGRAM (TEE) ?CARDIOVERSION ? ?Patient Location: PACU ? ?Anesthesia Type:MAC ? ?Level of Consciousness: awake, alert  and oriented ? ?Airway & Oxygen Therapy: Patient Spontanous Breathing ? ?Post-op Assessment: Report given to RN, Post -op Vital signs reviewed and stable and Patient moving all extremities X 4 ? ?Post vital signs: Reviewed and stable ? ?Last Vitals:  ?Vitals Value Taken Time  ?BP 88/59 11/15/21 0833  ?Temp    ?Pulse 72 11/15/21 0834  ?Resp 18 11/15/21 0834  ?SpO2 96 % 11/15/21 0834  ?Vitals shown include unvalidated device data. ? ?Last Pain:  ?Vitals:  ? 11/15/21 0748  ?TempSrc: Temporal  ?PainSc: 0-No pain  ?   ? ?  ? ?Complications: No notable events documented. ?

## 2021-11-18 NOTE — Progress Notes (Signed)
Patient ID: [name] Hinton, male   DOB: 1970-12-11, 51 y.o.   MRN: [id number] ?PCP: Dr. Ashby Dawes ?Cardiology: Dr. Aundra Dubin ? ?[name] Hinton is a 51 y.o.with a prior history of alcohol abuse and former smoker (quit  2011). Hospitalized 1/15 with increased dyspnea-->ECHO performed and showed EF 25%, global hypokinesis, RV mildly dilated,severe aortic insufficiency.  Patient had new-onset acute systolic CHF and atrial flutter. Started on cardizem drip and Xarelto. Initially in A flutter--> became A fib.   He had TEE and DC-CV on 08/01/13.  Dr Roxy Manns provided consultation due to ascending aortic aneurysm and bicuspid valve with severe AI. He was discharged on ACEI, beta blocker, spironolactone, and xarelto. Discharge weight 150 pounds.   ? ?Had cough with lisinopril and switched to losartan. ? ?LHC/RHC was done 2/15 showing no coronary disease and elevated PCWP with pulmonary hypertension.  ? ?In 3/15, he had replacement of ascending aortic and aortic valve with Bentall procedure with mechanical aortic valve.  He also had Maze procedure and LAA obliteration.  He was noted to have gone into atypical atrial flutter by the time of his 3/30 followup with Dr. Roxy Manns.  He was started on amiodarone and went back into NSR. He is back at work.  Echo in 4/15 showed normally functioning mechanical aortic valve, but EF remained 30-35%.  ? ?Echo in 4/15 showed normally functioning mechanical aortic valve, but EF remained 30-35%.  ECHO 01/2014  EF up to 45% with normal mechanical aortic valve.  Echo 9/17 with increase in EF to 55-60% with mean gradient 13 mmHg across mechanical AoV.   ? 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  No other neurological symptoms.  ? ?In 3/18, he was admitted with lower GI bleeding/symptomatic anemia.  He received 2 units PRBCs.  Suspected to be hemorrhoidal.  He had a colonoscopy confirming hemorrhoids and showing 1 polyp that was removed.   ? ?Echo in 11/19 showed EF 55% with mild LVH,  normal RV size with mildly decreased systolic function, normal mechanical aortic valve. Echo in 10/21 showed EF 50-55%, normal RV, moderate biatrial enlargement, mechanical aortic valve appeared to function normally.  ? ?Follow up 12/22, stable NYHA I symptoms and no longer drinking ETOH. Remained in NSR.  ? ?Back in AFL RVR 4/23. Underwent TEE and DCCV 11/15/21 --> NSR. EF 50-55%, normal RV, no LAA, AV ok, mean gradient 7 mmHg. ? ?Today he returns for post cardioversion follow up. Overall feeling much better. 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 Denies palpitations, abnormal bleeding, CP, dizziness, edema, or PND/Orthopnea. Appetite ok. No fever or chills. Weight at home 185 pounds. Taking all medications. Making dietary changes and starting to get back into the gym. No longer drinking ETOH, no tobacco use but wife smokes in the home. ? ?ECG (personally reviewed): NSR, inferior TWI.  ? ?Labs (1/15): K 5.2, creatinine 1.3, AST 40, ALT 58 ?Labs (2/15): K 4.7, creatinine 1.1 ?Labs (4/15): K 4.9, creatinine 1.0, LFTs normal, TSH normal ?Labs (5/15): K 4.6, creatinine 1.1, BNP 134 ?Labs (6/15): K 4.8  creatinine 1.0. INR 2.4 ?Labs (03/01/14): K 4.7 Creatinine 1.16  ?Labs (11/15): K 4.8, creatinine 0.79 ?Labs (3/18): K 4.4, creatinine 0.8, hgb 10.2 ?Labs (7/18): K 4.2, creatinine 0.86, hgb 9.8 ?Labs (7/21): LDL 146, K 4.4, creatinine 0.92, hgb 13.5 ?Labs (12/22): LDL 116, HDL 82 ?Labs (4/23): K 4.0, creatinine 0.89, hgb 13.4 ? ?PMH:  ?1. A fib/Aflutter: Initially flutter during  1/15 admission, degenerated to fibrillation --> S/P TEE DC-CV to NSR. Atypical atrial flutter recurred post-op in 3/15. 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  ?- s/p TEE with DCCV 4/23 --> NSR. ?2. 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  TEE (1/15) with EF 25%, moderate LV dilation, bicuspid aortic valve with severe eccentric AI and no AS,  ascending aorta 5.0 cm, moderately decreased RV systolic function.  CTA chest (1/15) with 5.1 cm ascending aortic aneurysm. Patient had Bentall replacement of ascending aorta with mechanical aortic valve and Maze with LAA obliteration in 3/15.  ?- TEE (4/23): mean gradient 7 mmHg. ?3. Cardiomyopathy with systolic CHF: As above, TEE with EF 25% and moderately dilated LV, moderately decreased RV systolic function.  Tachy-mediated CMP versus ETOH CMP versus long-standing AI versus a combination of the three.  LHC/RHC (2/15): no coronary disease, mean RA 5, PA 62/25, mean PCWP 22, CI 1.8, PVR 5.2 WU, LVEDP 35 (severe AI).  Echo (2/15): EF 35-40%, moderately dilated LV, bicuspid aortic valve with severe AI.  Echo (4/15) with mildly dilated LV, mild LVH, EF 30-35%, normal RV size and systolic function, mechanical aortic valve appeared to function normally, IVC small.  Echo (7/15) with EF 45%, normal mechanical aortic valve, normal RV size with mildly decreased systolic function.  ?- Echo (9/17) with EF 55-60%, mechanical aortic valve with mean gradient 13 mmHg.  ?- Echo (11/19) with EF 55% with mild LVH, normal RV size with mildly decreased systolic function, normal mechanical aortic valve.  ?- Echo (10/21): EF 50-55%, normal RV, moderate biatrial enlargement, mechanical aortic valve appeared to function normally. ?- TEE (4/23): EF 50-55%, no regional WMAs, normal RV, no LA thrombus, no PFO/ASD, mechanical valve without significant regurgitation, mean gradient 7 mmHg. ?4. Former smoker quit 2011. ?5. Alcohol abuse but has cut back considerably.  ?6. ACEI cough. ?7. Palpitations: Holter monitor (12/15) with occasional PVCs and PACs.  ?8. Hyperlipidemia ? ?FH: Father- Heart Failure, Grandfather CAD CABG  ? ?SH: Quit smoking 2011. Heavy ETOH until 1/15.  Engaged.  Works as Freight forwarder at [employer].  ? ?ROS: All systems negative except as listed in HPI, PMH and Problem List. ? ?Current Outpatient Medications  ?Medication Sig  Dispense Refill  ? aspirin EC 81 MG tablet Take 1 tablet (81 mg total) by mouth daily. 90 tablet 3  ? carvedilol (COREG) 12.5 MG tablet TAKE 1 TABLET(12.5 MG) BY MOUTH TWICE DAILY WITH A MEAL 60 tablet 11  ? Ferrous Sulfate (IRON PO) Take 130 mg by mouth daily.    ? spironolactone (ALDACTONE) 25 MG tablet TAKE 1 TABLET(25 MG) BY MOUTH DAILY 90 tablet 3  ? valsartan (DIOVAN) 80 MG tablet Take 1 tablet (80 mg total) by mouth 2 (two) times daily. 180 tablet 4  ? warfarin (COUMADIN) 5 MG tablet TAKE 1 TO 1.5 TABLETS BY MOUTH ONCE DAILY AS DIRECTED BY COUMADIN CLINIC (Patient taking differently: Take 5-7.5 mg by mouth See admin instructions. TAKE 1 TO 1.5 TABLETS BY MOUTH ONCE DAILY AS DIRECTED BY COUMADIN CLINIC; 7.5 mg on Mon and Fri, 5 mg all other days) 40 tablet 1  ? ?No current facility-administered medications for this encounter.  ? ?BP 110/68   Pulse 65   Wt 84.1 kg   SpO2 99%   BMI 28.19 kg/m?  ? ?Wt Readings from Last 3 Encounters:  ?11/20/21 84.1 kg  ?11/15/21 77.1 kg  ?07/04/21 83.9 kg  ? ?PHYSICAL EXAM: ?General:  NAD. No  resp difficulty ?HEENT: Normal ?Neck: Supple. No JVD. Carotids 2+ bilat; no bruits. No lymphadenopathy or thryomegaly appreciated. ?Cor: PMI nondisplaced. Regular rate & rhythm. No rubs, gallops or murmurs. + mechanical S2 ?Lungs: Clear ?Abdomen: Soft, nontender, nondistended. No hepatosplenomegaly. No bruits or masses. Good bowel sounds. ?Extremities: No cyanosis, clubbing, rash, edema ?Neuro: Alert & oriented x 3, cranial nerves grossly intact. Moves all 4 extremities w/o difficulty. Affect pleasant. ? ?ASSESSMENT & PLAN: ?1. Chronic systolic CHF: Nonischemic cardiomyopathy.  Tachy-mediated CMP versus ETOH CMP versus long-standing aortic insufficiency or (most likely) a combination of all 3 possible causes.  He is no longer drinking and has had mechanical AVR.  Echo in 10/21 showed EF up to low normal, 50-55%. TEE (4/23) EF 50-55%, normal RV. NYHA class I, he is not volume overloaded.    ?- Continue Coreg 12.5 mg bid.  ?- Continue valsartan 80 mg bid. Intolerant ace due to cough.  ?- Continue spironolactone 25 mg daily.   ?- BMET today (should get at least every 3 months, will arrange).  ?2. [place]

## 2021-11-20 ENCOUNTER — Ambulatory Visit (HOSPITAL_COMMUNITY)
Admit: 2021-11-20 | Discharge: 2021-11-20 | Disposition: A | Discharge status: Home / Self Care | Payer: BC Managed Care – PPO | Attending: Family Medicine | Admitting: Family Medicine

## 2021-11-20 ENCOUNTER — Encounter (HOSPITAL_COMMUNITY): Payer: Self-pay

## 2021-11-20 ENCOUNTER — Telehealth: Payer: Self-pay | Admitting: *Deleted

## 2021-11-20 ENCOUNTER — Ambulatory Visit (INDEPENDENT_AMBULATORY_CARE_PROVIDER_SITE_OTHER): Payer: BC Managed Care – PPO | Admitting: *Deleted

## 2021-11-20 ENCOUNTER — Telehealth (HOSPITAL_COMMUNITY): Payer: Self-pay | Admitting: Surgery

## 2021-11-20 VITALS — BP 110/68 | HR 65 | Wt 185.4 lb

## 2021-11-20 DIAGNOSIS — Z79899 Other long term (current) drug therapy: Secondary | ICD-10-CM | POA: Diagnosis not present

## 2021-11-20 DIAGNOSIS — I480 Paroxysmal atrial fibrillation: Secondary | ICD-10-CM | POA: Insufficient documentation

## 2021-11-20 DIAGNOSIS — Z7982 Long term (current) use of aspirin: Secondary | ICD-10-CM | POA: Diagnosis not present

## 2021-11-20 DIAGNOSIS — I110 Hypertensive heart disease with heart failure: Secondary | ICD-10-CM | POA: Diagnosis not present

## 2021-11-20 DIAGNOSIS — I4892 Unspecified atrial flutter: Secondary | ICD-10-CM | POA: Insufficient documentation

## 2021-11-20 DIAGNOSIS — Z87891 Personal history of nicotine dependence: Secondary | ICD-10-CM | POA: Diagnosis not present

## 2021-11-20 DIAGNOSIS — Z952 Presence of prosthetic heart valve: Secondary | ICD-10-CM | POA: Diagnosis not present

## 2021-11-20 DIAGNOSIS — I10 Essential (primary) hypertension: Secondary | ICD-10-CM

## 2021-11-20 DIAGNOSIS — Z954 Presence of other heart-valve replacement: Secondary | ICD-10-CM

## 2021-11-20 DIAGNOSIS — Z7901 Long term (current) use of anticoagulants: Secondary | ICD-10-CM | POA: Diagnosis not present

## 2021-11-20 DIAGNOSIS — I5022 Chronic systolic (congestive) heart failure: Secondary | ICD-10-CM | POA: Diagnosis not present

## 2021-11-20 DIAGNOSIS — I428 Other cardiomyopathies: Secondary | ICD-10-CM | POA: Diagnosis not present

## 2021-11-20 DIAGNOSIS — E785 Hyperlipidemia, unspecified: Secondary | ICD-10-CM | POA: Diagnosis not present

## 2021-11-20 DIAGNOSIS — F1011 Alcohol abuse, in remission: Secondary | ICD-10-CM

## 2021-11-20 DIAGNOSIS — Z9889 Other specified postprocedural states: Secondary | ICD-10-CM | POA: Diagnosis not present

## 2021-11-20 DIAGNOSIS — I7121 Aneurysm of the ascending aorta, without rupture: Secondary | ICD-10-CM | POA: Insufficient documentation

## 2021-11-20 LAB — BASIC METABOLIC PANEL
Anion gap: 8 (ref 5–15)
BUN: 11 mg/dL (ref 6–20)
CO2: 28 mmol/L (ref 22–32)
Calcium: 9.2 mg/dL (ref 8.9–10.3)
Chloride: 105 mmol/L (ref 98–111)
Creatinine, Ser: 0.99 mg/dL (ref 0.61–1.24)
GFR, Estimated: >60 mL/min (ref >60)
Glucose, Bld: 152 mg/dL — ABNORMAL HIGH (ref 70–99)
Potassium: 4.8 mmol/L (ref 3.5–5.1)
Sodium: 141 mmol/L (ref 135–145)

## 2021-11-20 LAB — PROTIME-INR
INR: 1.5 — ABNORMAL HIGH (ref 0.8–1.2)
Prothrombin Time: 17.7 seconds — ABNORMAL HIGH (ref 11.4–15.2)

## 2021-11-20 MED ORDER — SPIRONOLACTONE 25 MG PO TABS: ORAL_TABLET | ORAL | 3 refills | Status: DC

## 2021-11-20 MED ORDER — VALSARTAN 80 MG PO TABS: 80.0000 mg | ORAL_TABLET | Freq: Two times a day (BID) | ORAL | 3 refills | Status: DC

## 2021-11-20 NOTE — Telephone Encounter (Signed)
The message forwarded to [facility] Paramedic  ?

## 2021-11-20 NOTE — Patient Instructions (Signed)
Labs done today. We will contact you only if your labs are abnormal. ? ?No medication changes were made. Please continue all current medications as prescribed. ? ?Your physician recommends that you schedule a follow-up appointment in: 3 months for a lab only appointment and in 6 months for an appointment with Dr. Aundra Dubin.  ? ?If you have any questions or concerns before your next appointment please send [place] a message through [place] or call our office at [phone].   ? ?TO LEAVE A MESSAGE FOR THE NURSE SELECT OPTION 2, PLEASE LEAVE A MESSAGE INCLUDING: ?YOUR NAME ?DATE OF BIRTH ?CALL BACK NUMBER ?REASON FOR CALL**this is important as we prioritize the call backs ? ?YOU WILL RECEIVE A CALL BACK THE SAME DAY AS LONG AS YOU CALL BEFORE 4:00 PM ? ? ?Do the following things EVERYDAY: ?Weigh yourself in the morning before breakfast. Write it down and keep it in a log. ?Take your medicines as prescribed ?Eat low salt foods--Limit salt (sodium) to 2000 mg per day.  ?Stay as active as you can everyday ?Limit all fluids for the day to less than 2 liters ? ? ?At the [facility], you and your health needs are our priority. As part of our continuing mission to provide you with exceptional heart care, we have created designated Provider Care Teams. These Care Teams include your primary Cardiologist (physician) and Advanced Practice Providers (APPs- Physician Assistants and Nurse Practitioners) who all work together to provide you with the care you need, when you need it.  ? ?You may see any of the following providers on your designated Care Team at your next follow up: ?Dr Glori Bickers ?Dr [name] ?Darrick Grinder, NP ?Lyda Jester, PA ?Audry Riles, PharmD ? ? ?Please be sure to bring in all your medications bottles to every appointment.  ? ?

## 2021-11-20 NOTE — Telephone Encounter (Signed)
Called pt regarding the INR results that were done today [facility]; there was no answer so left a message for the pt to call back at [phone] to discuss and make an in office appt.  ?

## 2021-11-20 NOTE — Addendum Note (Signed)
Encounter addended by: Shonna Chock, CMA on: 02/22/8849 10:26 AM ? Actions taken: Pharmacy for encounter modified, Order list changed

## 2021-11-20 NOTE — Telephone Encounter (Signed)
-----   Message from Rafael Bihari, [place] sent at 11/20/2021 10:15 AM EDT ----- ?Labs stable. INR sub-therapeutic. Will forward to [facility]in Clinic (pt has been eating more leafy greens) ?

## 2021-11-20 NOTE — Telephone Encounter (Signed)
-----   Message from Rafael Bihari, [place] sent at 11/20/2021 10:15 AM EDT ----- ?Labs stable. INR sub-therapeutic. Will forward to [facility]in Clinic (pt has been eating more leafy greens) ?

## 2021-11-21 NOTE — Patient Instructions (Addendum)
Description   ?5/3-LM to call back-ch, 5/4-LM to call back- ab, 5/4 '@[phone]'$ -LM-ch ?Spoke with pt and instructed to take 1.5 tablets today and then take 2 tablets tomorrow, then resume same dosage of Warfarin 1 tablet daily except for 1.5 tablets on Mondays and Fridays. Recheck INR in 1 week. [facility] Clinic [phone], call with any changes in medication or up coming procedure.  ?  ?  ? ?

## 2021-11-28 ENCOUNTER — Ambulatory Visit (INDEPENDENT_AMBULATORY_CARE_PROVIDER_SITE_OTHER): Payer: BC Managed Care – PPO | Admitting: *Deleted

## 2021-11-28 DIAGNOSIS — Z5181 Encounter for therapeutic drug level monitoring: Secondary | ICD-10-CM | POA: Diagnosis not present

## 2021-11-28 DIAGNOSIS — Z9889 Other specified postprocedural states: Secondary | ICD-10-CM

## 2021-11-28 DIAGNOSIS — I480 Paroxysmal atrial fibrillation: Secondary | ICD-10-CM | POA: Diagnosis not present

## 2021-11-28 DIAGNOSIS — Z8679 Personal history of other diseases of the circulatory system: Secondary | ICD-10-CM

## 2021-11-28 DIAGNOSIS — Z954 Presence of other heart-valve replacement: Secondary | ICD-10-CM

## 2021-11-28 DIAGNOSIS — I359 Nonrheumatic aortic valve disorder, unspecified: Secondary | ICD-10-CM

## 2021-11-28 DIAGNOSIS — I351 Nonrheumatic aortic (valve) insufficiency: Secondary | ICD-10-CM

## 2021-11-28 LAB — POCT INR: INR: 1.9 — AB (ref 2.0–3.0)

## 2021-11-28 NOTE — Patient Instructions (Signed)
Description   ?Today take 1.5 tablets and then take 2 tablets tomorrow, then start Warfarin 1 tablet daily except for 1.5 tablets on Mondays, Wednesdays, and Fridays. Recheck INR in 2 weeks. Coumadin Clinic [phone], call with any changes in medication or up coming procedure.  ?  ?  ?

## 2021-12-12 ENCOUNTER — Ambulatory Visit (INDEPENDENT_AMBULATORY_CARE_PROVIDER_SITE_OTHER): Payer: BC Managed Care – PPO

## 2021-12-12 DIAGNOSIS — Z9889 Other specified postprocedural states: Secondary | ICD-10-CM

## 2021-12-12 DIAGNOSIS — Z5181 Encounter for therapeutic drug level monitoring: Secondary | ICD-10-CM

## 2021-12-12 DIAGNOSIS — I359 Nonrheumatic aortic valve disorder, unspecified: Secondary | ICD-10-CM

## 2021-12-12 DIAGNOSIS — Z8679 Personal history of other diseases of the circulatory system: Secondary | ICD-10-CM

## 2021-12-12 DIAGNOSIS — Z954 Presence of other heart-valve replacement: Secondary | ICD-10-CM

## 2021-12-12 DIAGNOSIS — I351 Nonrheumatic aortic (valve) insufficiency: Secondary | ICD-10-CM

## 2021-12-12 DIAGNOSIS — I480 Paroxysmal atrial fibrillation: Secondary | ICD-10-CM | POA: Diagnosis not present

## 2021-12-12 LAB — POCT INR: INR: 3.2 — AB (ref 2.0–3.0)

## 2021-12-12 NOTE — Patient Instructions (Signed)
Description   Continue on same dosage of Warfarin 1 tablet daily except for 1.5 tablets on Mondays, Wednesdays, and Fridays. Recheck INR in 3 weeks. [facility] [phone], call with any changes in medication or up coming procedure.

## 2021-12-16 LAB — ECHO TEE
AV Mean grad: 6 mmHg
AV Peak grad: 9.7 mmHg
Ao pk vel: 1.56 m/s

## 2022-01-06 ENCOUNTER — Ambulatory Visit (INDEPENDENT_AMBULATORY_CARE_PROVIDER_SITE_OTHER): Payer: BC Managed Care – PPO

## 2022-01-06 DIAGNOSIS — I359 Nonrheumatic aortic valve disorder, unspecified: Secondary | ICD-10-CM | POA: Diagnosis not present

## 2022-01-06 DIAGNOSIS — Z8679 Personal history of other diseases of the circulatory system: Secondary | ICD-10-CM | POA: Diagnosis not present

## 2022-01-06 DIAGNOSIS — I351 Nonrheumatic aortic (valve) insufficiency: Secondary | ICD-10-CM | POA: Diagnosis not present

## 2022-01-06 DIAGNOSIS — Z9889 Other specified postprocedural states: Secondary | ICD-10-CM

## 2022-01-06 DIAGNOSIS — I480 Paroxysmal atrial fibrillation: Secondary | ICD-10-CM | POA: Diagnosis not present

## 2022-01-06 DIAGNOSIS — Z5181 Encounter for therapeutic drug level monitoring: Secondary | ICD-10-CM | POA: Diagnosis not present

## 2022-01-06 DIAGNOSIS — Z954 Presence of other heart-valve replacement: Secondary | ICD-10-CM

## 2022-01-06 LAB — POCT INR: INR: 2.5 (ref 2.0–3.0)

## 2022-01-06 NOTE — Patient Instructions (Signed)
Continue on same dosage of Warfarin 1 tablet daily except for 1.5 tablets on Mondays, Wednesdays, and Fridays. Recheck INR in 6 weeks. [facility]in [facility] [phone], call with any changes in medication or up coming procedure.

## 2022-01-09 ENCOUNTER — Other Ambulatory Visit: Payer: Self-pay | Admitting: Cardiology

## 2022-01-09 DIAGNOSIS — I480 Paroxysmal atrial fibrillation: Secondary | ICD-10-CM

## 2022-01-09 DIAGNOSIS — Q231 Congenital insufficiency of aortic valve: Secondary | ICD-10-CM

## 2022-01-09 NOTE — Telephone Encounter (Signed)
Prescription refill request received for warfarin [place]: 11/20/21 Sherri Sear, NP) Next INR check: 02/17/22 Warfarin tablet strength: '5mg'$   Appropriate dose and refill sent to requested pharmacy.

## 2022-02-11 ENCOUNTER — Telehealth (HOSPITAL_COMMUNITY): Payer: Self-pay | Admitting: *Deleted

## 2022-02-11 NOTE — Telephone Encounter (Signed)
Pts wife called to report pt feels his heart is out of rhythm but does not think it is afib. She said pt has been tired and she feels " his color is off". She said he has no other complaints and is resting but she wants pt seen in office. I advised her that if he feels bad or any other symptoms starts he needs to go to the emergency room. She said if he had any changes she would take him to the ER but for now pt has been scheduled for tomorrow with APP clinic.

## 2022-02-12 ENCOUNTER — Ambulatory Visit (HOSPITAL_COMMUNITY)
Admission: RE | Admit: 2022-02-12 | Discharge: 2022-02-12 | Disposition: A | Discharge status: Home / Self Care | Payer: BC Managed Care – PPO | Source: Ambulatory Visit | Attending: Family Medicine | Admitting: Family Medicine

## 2022-02-12 ENCOUNTER — Other Ambulatory Visit (HOSPITAL_COMMUNITY): Payer: Self-pay

## 2022-02-12 ENCOUNTER — Telehealth (HOSPITAL_COMMUNITY): Payer: Self-pay

## 2022-02-12 ENCOUNTER — Encounter (HOSPITAL_COMMUNITY): Payer: Self-pay

## 2022-02-12 ENCOUNTER — Telehealth (HOSPITAL_COMMUNITY): Payer: Self-pay | Admitting: Surgery

## 2022-02-12 VITALS — BP 114/78 | HR 108 | Wt 183.6 lb

## 2022-02-12 DIAGNOSIS — Z952 Presence of prosthetic heart valve: Secondary | ICD-10-CM | POA: Insufficient documentation

## 2022-02-12 DIAGNOSIS — I4892 Unspecified atrial flutter: Secondary | ICD-10-CM | POA: Diagnosis not present

## 2022-02-12 DIAGNOSIS — I10 Essential (primary) hypertension: Secondary | ICD-10-CM

## 2022-02-12 DIAGNOSIS — I428 Other cardiomyopathies: Secondary | ICD-10-CM | POA: Diagnosis not present

## 2022-02-12 DIAGNOSIS — Q231 Congenital insufficiency of aortic valve: Secondary | ICD-10-CM | POA: Diagnosis not present

## 2022-02-12 DIAGNOSIS — Z87891 Personal history of nicotine dependence: Secondary | ICD-10-CM | POA: Diagnosis not present

## 2022-02-12 DIAGNOSIS — I480 Paroxysmal atrial fibrillation: Secondary | ICD-10-CM | POA: Insufficient documentation

## 2022-02-12 DIAGNOSIS — Z954 Presence of other heart-valve replacement: Secondary | ICD-10-CM

## 2022-02-12 DIAGNOSIS — R059 Cough, unspecified: Secondary | ICD-10-CM | POA: Insufficient documentation

## 2022-02-12 DIAGNOSIS — Z7901 Long term (current) use of anticoagulants: Secondary | ICD-10-CM | POA: Diagnosis not present

## 2022-02-12 DIAGNOSIS — I5022 Chronic systolic (congestive) heart failure: Secondary | ICD-10-CM | POA: Diagnosis not present

## 2022-02-12 DIAGNOSIS — Z7982 Long term (current) use of aspirin: Secondary | ICD-10-CM | POA: Insufficient documentation

## 2022-02-12 DIAGNOSIS — F1010 Alcohol abuse, uncomplicated: Secondary | ICD-10-CM | POA: Diagnosis not present

## 2022-02-12 DIAGNOSIS — F1011 Alcohol abuse, in remission: Secondary | ICD-10-CM

## 2022-02-12 DIAGNOSIS — Z79899 Other long term (current) drug therapy: Secondary | ICD-10-CM | POA: Diagnosis not present

## 2022-02-12 DIAGNOSIS — I110 Hypertensive heart disease with heart failure: Secondary | ICD-10-CM | POA: Diagnosis not present

## 2022-02-12 DIAGNOSIS — E785 Hyperlipidemia, unspecified: Secondary | ICD-10-CM

## 2022-02-12 LAB — BASIC METABOLIC PANEL
Anion gap: 4 — ABNORMAL LOW (ref 5–15)
BUN: 10 mg/dL (ref 6–20)
CO2: 27 mmol/L (ref 22–32)
Calcium: 9 mg/dL (ref 8.9–10.3)
Chloride: 108 mmol/L (ref 98–111)
Creatinine, Ser: 0.89 mg/dL (ref 0.61–1.24)
GFR, Estimated: >60 mL/min (ref >60)
Glucose, Bld: 116 mg/dL — ABNORMAL HIGH (ref 70–99)
Potassium: 4.2 mmol/L (ref 3.5–5.1)
Sodium: 139 mmol/L (ref 135–145)

## 2022-02-12 LAB — BRAIN NATRIURETIC PEPTIDE: B Natriuretic Peptide: 398.9 pg/mL — ABNORMAL HIGH (ref 0.0–100.0)

## 2022-02-12 LAB — PROTIME-INR
INR: 2.5 — ABNORMAL HIGH (ref 0.8–1.2)
Prothrombin Time: 27.2 seconds — ABNORMAL HIGH (ref 11.4–15.2)

## 2022-02-12 MED ORDER — POTASSIUM CHLORIDE CRYS ER 20 MEQ PO TBCR: 20.0000 meq | EXTENDED_RELEASE_TABLET | ORAL | 4 refills | Status: AC | PRN

## 2022-02-12 MED ORDER — FUROSEMIDE 20 MG PO TABS: 20.0000 mg | ORAL_TABLET | ORAL | 1 refills | Status: AC | PRN

## 2022-02-12 NOTE — Progress Notes (Signed)
ReDS Vest / Clip - 02/12/22 1000       ReDS Vest / Clip   Station Marker C    Ruler Value 28.5    ReDS Value Range Moderate volume overload    ReDS Actual Value 39

## 2022-02-12 NOTE — Patient Instructions (Signed)
No changes to your medications.  Labs done today, your results will be available in MyChart, we will contact you for abnormal readings.  You are scheduled for a Cardioversion on Friday July 28th with Dr. Aundra Dubin.  Please arrive at the [facility] (Main Entrance A) at [facility]: [place], [place] [address] at 12.00 pm. (1 hour prior to procedure)  DIET: Nothing to eat or drink after midnight except a sip of water with medications (see medication instructions below)  FYI: For your safety, and to allow [place] to monitor your vital signs accurately during the surgery/procedure we request that   if you have artificial nails, gel coating, SNS etc. Please have those removed prior to your surgery/procedure. Not having the nail coverings /polish removed may result in cancellation or delay of your surgery/procedure.   Medication Instructions: Hold Spironolactone Morning of Procedure   Continue your anticoagulant: Warfarin  You will need to continue your anticoagulant after your procedure until you  are told by your  Provider that it is safe to stop  You must have a responsible person to drive you home and stay in the waiting area during your procedure. Failure to do so could result in cancellation.  Bring your insurance cards.  *Special Note: Every effort is made to have your procedure done on time. Occasionally there are emergencies that occur at the hospital that may cause delays. Please be patient if a delay does occur.   You have been referred to the Cardiac Electrophysiologist, for an ablation. They will call you to arrange the appointment.    If you have any questions or concerns before your next appointment please send [place] a message through [place] or call our office at [phone].    TO LEAVE A MESSAGE FOR THE NURSE SELECT OPTION 2, PLEASE LEAVE A MESSAGE INCLUDING: YOUR NAME DATE OF BIRTH CALL BACK NUMBER REASON FOR CALL**this is important as we prioritize the call  backs  YOU WILL RECEIVE A CALL BACK THE SAME DAY AS LONG AS YOU CALL BEFORE 4:00 PM  At the [facility], you and your health needs are our priority. As part of our continuing mission to provide you with exceptional heart care, we have created designated Provider Care Teams. These Care Teams include your primary Cardiologist (physician) and Advanced Practice Providers (APPs- Physician Assistants and Nurse Practitioners) who all work together to provide you with the care you need, when you need it.   You may see any of the following providers on your designated Care Team at your next follow up: Dr Glori Bickers Dr Haynes Kerns, NP Lyda Jester, [place] [facility] [place], [place] Audry Riles, PharmD   Please be sure to bring in all your medications bottles to every appointment.

## 2022-02-12 NOTE — Telephone Encounter (Addendum)
Pt aware, agreeable, and verbalized understanding  Script sent in to pharmacy  ----- Message from Rafael Bihari, FNP sent at 02/12/2022  1:50 PM EDT ----- BNP elevated, suggesting fluid, likely from atrial flutter.  Please take Lasix 20 mg daily x 3 days, then can use PRN weight gain/swelling. He will need to take 20 KCL x 3 days while taking lasix.

## 2022-02-12 NOTE — Telephone Encounter (Signed)
-----   Message from Rafael Bihari, [name] sent at 02/12/2022  1:50 PM EDT ----- BNP elevated, suggesting fluid, likely from atrial flutter.  Please take Lasix 20 mg daily x 3 days, then can use PRN weight gain/swelling. He will need to take 20 KCL x 3 days while taking lasix.

## 2022-02-12 NOTE — Progress Notes (Signed)
Patient ID: [name] Hinton, male   DOB: 01/18/1971, 51 y.o.   MRN: [id number] PCP: [name] Hinton Cardiology: [name] Hinton  [name] Hinton is a 51 y.o.with a prior history of alcohol abuse and former smoker (quit  2011). Hospitalized 1/15 with increased dyspnea-->ECHO performed and showed EF 25%, global hypokinesis, RV mildly dilated,severe aortic insufficiency.  Patient had new-onset acute systolic CHF and atrial flutter. Started on cardizem drip and Xarelto. Initially in A flutter--> became A fib.   He had TEE and DC-CV on 08/01/13.  Dr [name] Hinton provided consultation due to ascending aortic aneurysm and bicuspid valve with severe AI. He was discharged on ACEI, beta blocker, spironolactone, and xarelto. Discharge weight 150 pounds.    Had cough with lisinopril and switched to losartan.  LHC/RHC was done 2/15 showing no coronary disease and elevated PCWP with pulmonary hypertension.   In 3/15, he had replacement of ascending aortic and aortic valve with Bentall procedure with mechanical aortic valve.  He also had Maze procedure and LAA obliteration.  He was noted to have gone into atypical atrial flutter by the time of his 3/30 followup with Dr. Roxy Hinton.  He was started on amiodarone and went back into NSR. He is back at work.  Echo in 4/15 showed normally functioning mechanical aortic valve, but EF remained 30-35%.   Echo in 4/15 showed normally functioning mechanical aortic valve, but EF remained 30-35%.  ECHO 01/2014  EF up to 45% with normal mechanical aortic valve.  Echo 9/17 with increase in EF to 55-60% with mean gradient 13 mmHg across mechanical AoV.    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  No other neurological symptoms.   In 3/18, he was admitted with lower GI bleeding/symptomatic anemia.  He received 2 units PRBCs.  Suspected to be hemorrhoidal.  He had a colonoscopy confirming hemorrhoids and showing 1 polyp that was removed.    Echo in 11/19 showed EF 55% with mild LVH,  normal RV size with mildly decreased systolic function, normal mechanical aortic valve. Echo in 10/21 showed EF 50-55%, normal RV, moderate biatrial enlargement, mechanical aortic valve appeared to function normally.   Follow up 12/22, stable NYHA I symptoms and no longer drinking ETOH. Remained in NSR.   Back in AFL RVR 4/23. Underwent TEE and DCCV 11/15/21 --> NSR. EF 50-55%, normal RV, no LAA, AV ok, mean gradient 7 mmHg.  Post cardioversion follow up, feeling well and remained in NSR.  Today he returns for an acute visit with complaints of palpitations. He felt like he went out of rhythm a couple days ago. Fatigued and "fuzzy-headed." Overall feeling bad. Denies SOB, CP, abnormal bleeding, edema, or PND/Orthopnea. Appetite ok. No fever or chills. Weight at home 180 pounds. Taking all medications. No longer drinking ETOH, no tobacco use but wife smokes in the home. 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 INR 2.4 Labs (03/01/14): K 4.7 Creatinine 1.16  Labs (11/15): K 4.8, creatinine 0.79 Labs (3/18): K 4.4, creatinine 0.8, hgb 10.2 Labs (7/18): K 4.2, creatinine 0.86, hgb 9.8 Labs (7/21): LDL 146, K 4.4, creatinine 0.92, hgb 13.5 Labs (12/22): LDL 116,  HDL 82 Labs (4/23): K 4.0, creatinine 0.89, hgb 13.4 Labs (5/23): K 4.8, creatinine 0.88  PMH:  1. A fib/Aflutter: Initially flutter during 1/15 admission, degenerated to fibrillation --> S/P TEE DC-CV to NSR. Atypical atrial flutter recurred post-op in 3/15. 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  - s/p TEE with DCCV 4/23 --> NSR. 2. Bicuspid aortic valve disorder: Bicuspid aortic valve with ascending aortic aneurysm and  aortic insufficiency.  TEE (1/15) with EF 25%, moderate LV dilation, bicuspid aortic valve with severe eccentric AI and no AS, ascending aorta 5.0 cm, moderately decreased RV systolic function.  CTA chest (1/15) with 5.1 cm ascending aortic aneurysm. Patient had Bentall replacement of ascending aorta with mechanical aortic valve and Maze with LAA obliteration in 3/15.  - TEE (4/23): mean gradient 7 mmHg. 3. Cardiomyopathy with systolic CHF: As above, TEE with EF 25% and moderately dilated LV, moderately decreased RV systolic function.  Tachy-mediated CMP versus ETOH CMP versus long-standing AI versus a combination of the three.  LHC/RHC (2/15): no coronary disease, mean RA 5, PA 62/25, mean PCWP 22, CI 1.8, PVR 5.2 WU, LVEDP 35 (severe AI).  Echo (2/15): EF 35-40%, moderately dilated LV, bicuspid aortic valve with severe AI.  Echo (4/15) with mildly dilated LV, mild LVH, EF 30-35%, normal RV size and systolic function, mechanical aortic valve appeared to function normally, IVC small.  Echo (7/15) with EF 45%, normal mechanical aortic valve, normal RV size with mildly decreased systolic function.  - Echo (9/17) with EF 55-60%, mechanical aortic valve with mean gradient 13 mmHg.  - Echo (11/19) with EF 55% with mild LVH, normal RV size with mildly decreased systolic function, normal mechanical aortic valve.  - Echo (10/21): EF 50-55%, normal RV, moderate biatrial enlargement, mechanical aortic valve appeared to function normally. - TEE (4/23): EF 50-55%, no regional WMAs, normal RV, no LA thrombus, no PFO/ASD, mechanical valve without significant regurgitation, mean gradient 7 mmHg. 4. Former smoker quit 2011. 5. Alcohol abuse but has cut back considerably.  6. ACEI cough. 7. Palpitations: Holter monitor (12/15) with occasional PVCs and PACs.  8. Hyperlipidemia  FH: Father- Heart Failure, Grandfather CAD CABG   SH: Quit smoking 2011. Heavy ETOH until 1/15.  Engaged.  Works as Freight forwarder at [employer].    ROS: All systems negative except as listed in HPI, PMH and Problem List.  Current Outpatient Medications  Medication Sig Dispense Refill   aspirin EC 81 MG tablet Take 1 tablet (81 mg total) by mouth daily. 90 tablet 3   carvedilol (COREG) 12.5 MG tablet TAKE 1 TABLET(12.5 MG) BY MOUTH TWICE DAILY WITH A MEAL 60 tablet 11   Ferrous Sulfate (IRON PO) Take 130 mg by mouth daily.     spironolactone (ALDACTONE) 25 MG tablet TAKE 1 TABLET(25 MG) BY MOUTH DAILY 90 tablet 3   valsartan (DIOVAN) 80 MG tablet Take 1 tablet (80 mg total) by mouth 2 (two) times daily. 180 tablet 3   warfarin (COUMADIN) 5 MG tablet TAKE 1-1 1/2 TABLETS DAILY AS DIRECTED 40 tablet 1   No current facility-administered medications for this encounter.   BP 114/78   Pulse (!) 108   Wt 83.3 kg (183 lb 9.6 oz)   SpO2 99%   BMI 27.92 kg/m   Wt Readings from Last 3 Encounters:  02/12/22 83.3 kg (183 lb 9.6 oz)  11/20/21 84.1 kg (185 lb 6.4 oz)  11/15/21 77.1 kg (170 lb)   PHYSICAL EXAM: General:  NAD. No resp difficulty, pale  and fatigued-appearing. HEENT: Normal Neck: Supple. No JVD. Carotids 2+ bilat; no bruits. No lymphadenopathy or thryomegaly appreciated. Cor: PMI nondisplaced. Tachy irregular rate & rhythm. No rubs, gallops or murmurs. + mechanical S2 Lungs: Clear Abdomen: Soft, nontender, nondistended. No hepatosplenomegaly. No bruits or masses. Good bowel sounds. Extremities: No cyanosis, clubbing, rash, edema Neuro: Alert & oriented x 3, cranial nerves grossly intact. Moves all 4 extremities w/o difficulty. Affect pleasant.  ASSESSMENT & PLAN: 1. Paroxysmal atrial fibrillation/flutter: s/p TEE/DCCV 08/01/13. Had Maze/LAA obliteration with Bentall.  Post-op atypical flutter.  No longer on amiodarone. - S/p DCCV 4/23 to NSR.    - Back in AFL w/ RVR on ECG today. From symptoms, likely went out of rhythm a couple days ago. - Discussed with [name] Hinton, arrange for DCCV and will refer to EP to discuss  options. Likely needs AFL ablation. - Hold off on re-starting amio for now. 2. Chronic systolic CHF: Nonischemic cardiomyopathy.  Tachy-mediated CMP versus ETOH CMP versus long-standing aortic insufficiency or (most likely) a combination of all 3 possible causes.  He is no longer drinking and has had mechanical AVR.  Echo in 10/21 showed EF up to low normal, 50-55%. TEE (4/23) EF 50-55%, normal RV. NYHA class III (due to #1), he is not volume overloaded.   - Continue Coreg 12.5 mg bid.  - Continue valsartan 80 mg bid. Intolerant ace due to cough.  - Continue spironolactone 25 mg daily.  BMET today 3. Bicuspid aortic valve disorder with severe AI and severely dilated ascending aorta (5.1 cm on CTA).  Status post mechanical AVR + ascending aorta replacement.  The valve looked good on 7/15 echo and TEE 4/23. Episode of possible amaurosis fugax in the past.  He is now on ASA 81 mg daily (which will be continued) and on warfarin with goal INR 2.5-3.5. INR managed [facility].  - Reinforced need for SBE prophylaxis. - Check INR today, will forward to Coumadin Clinic. 4. ETOH abuse: Completely stopped.  5. HTN: Stable. Continue current regimen. 6. Hyperlipidemia: Lipids ok 12/22.   Follow up after cardioversion and with [name] Hinton, as scheduled.  Maricela Bo [facility] FNP-BC 02/12/2022

## 2022-02-12 NOTE — H&P (View-Only) (Signed)
Patient ID: [name] Hinton, male   DOB: 1971/05/02, 51 y.o.   MRN: [id number] PCP: Dr. Ashby Dawes Cardiology: Dr. Aundra Dubin  [name] Hinton is a 51 y.o.with a prior history of alcohol abuse and former smoker (quit  2011). Hospitalized 1/15 with increased dyspnea-->ECHO performed and showed EF 25%, global hypokinesis, RV mildly dilated,severe aortic insufficiency.  Patient had new-onset acute systolic CHF and atrial flutter. Started on cardizem drip and Xarelto. Initially in A flutter--> became A fib.   He had TEE and DC-CV on 08/01/13.  Dr Roxy Manns provided consultation due to ascending aortic aneurysm and bicuspid valve with severe AI. He was discharged on ACEI, beta blocker, spironolactone, and xarelto. Discharge weight 150 pounds.    Had cough with lisinopril and switched to losartan.  LHC/RHC was done 2/15 showing no coronary disease and elevated PCWP with pulmonary hypertension.   In 3/15, he had replacement of ascending aortic and aortic valve with Bentall procedure with mechanical aortic valve.  He also had Maze procedure and LAA obliteration.  He was noted to have gone into atypical atrial flutter by the time of his 3/30 followup with Dr. Roxy Manns.  He was started on amiodarone and went back into NSR. He is back at work.  Echo in 4/15 showed normally functioning mechanical aortic valve, but EF remained 30-35%.   Echo in 4/15 showed normally functioning mechanical aortic valve, but EF remained 30-35%.  ECHO 01/2014  EF up to 45% with normal mechanical aortic valve.  Echo 9/17 with increase in EF to 55-60% with mean gradient 13 mmHg across mechanical AoV.    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  No other neurological symptoms.   In 3/18, he was admitted with lower GI bleeding/symptomatic anemia.  He received 2 units PRBCs.  Suspected to be hemorrhoidal.  He had a colonoscopy confirming hemorrhoids and showing 1 polyp that was removed.    Echo in 11/19 showed EF 55% with mild LVH,  normal RV size with mildly decreased systolic function, normal mechanical aortic valve. Echo in 10/21 showed EF 50-55%, normal RV, moderate biatrial enlargement, mechanical aortic valve appeared to function normally.   Follow up 12/22, stable NYHA I symptoms and no longer drinking ETOH. Remained in NSR.   Back in AFL RVR 4/23. Underwent TEE and DCCV 11/15/21 --> NSR. EF 50-55%, normal RV, no LAA, AV ok, mean gradient 7 mmHg.  Post cardioversion follow up, feeling well and remained in NSR.  Today he returns for an acute visit with complaints of palpitations. He felt like he went out of rhythm a couple days ago. Fatigued and "fuzzy-headed." Overall feeling bad. Denies SOB, CP, abnormal bleeding, edema, or PND/Orthopnea. Appetite ok. No fever or chills. Weight at home 180 pounds. Taking all medications. No longer drinking ETOH, no tobacco use but wife smokes in the home. 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 INR 2.4 Labs (03/01/14): K 4.7 Creatinine 1.16  Labs (11/15): K 4.8, creatinine 0.79 Labs (3/18): K 4.4, creatinine 0.8, hgb 10.2 Labs (7/18): K 4.2, creatinine 0.86, hgb 9.8 Labs (7/21): LDL 146, K 4.4, creatinine 0.92, hgb 13.5 Labs (12/22): LDL 116,  HDL 82 Labs (4/23): K 4.0, creatinine 0.89, hgb 13.4 Labs (5/23): K 4.8, creatinine 0.88  PMH:  1. A fib/Aflutter: Initially flutter during 1/15 admission, degenerated to fibrillation --> S/P TEE DC-CV to NSR. Atypical atrial flutter recurred post-op in 3/15. 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  - s/p TEE with DCCV 4/23 --> NSR. 2. Bicuspid aortic valve disorder: Bicuspid aortic valve with ascending aortic aneurysm and  aortic insufficiency.  TEE (1/15) with EF 25%, moderate LV dilation, bicuspid aortic valve with severe eccentric AI and no AS, ascending aorta 5.0 cm, moderately decreased RV systolic function.  CTA chest (1/15) with 5.1 cm ascending aortic aneurysm. Patient had Bentall replacement of ascending aorta with mechanical aortic valve and Maze with LAA obliteration in 3/15.  - TEE (4/23): mean gradient 7 mmHg. 3. Cardiomyopathy with systolic CHF: As above, TEE with EF 25% and moderately dilated LV, moderately decreased RV systolic function.  Tachy-mediated CMP versus ETOH CMP versus long-standing AI versus a combination of the three.  LHC/RHC (2/15): no coronary disease, mean RA 5, PA 62/25, mean PCWP 22, CI 1.8, PVR 5.2 WU, LVEDP 35 (severe AI).  Echo (2/15): EF 35-40%, moderately dilated LV, bicuspid aortic valve with severe AI.  Echo (4/15) with mildly dilated LV, mild LVH, EF 30-35%, normal RV size and systolic function, mechanical aortic valve appeared to function normally, IVC small.  Echo (7/15) with EF 45%, normal mechanical aortic valve, normal RV size with mildly decreased systolic function.  - Echo (9/17) with EF 55-60%, mechanical aortic valve with mean gradient 13 mmHg.  - Echo (11/19) with EF 55% with mild LVH, normal RV size with mildly decreased systolic function, normal mechanical aortic valve.  - Echo (10/21): EF 50-55%, normal RV, moderate biatrial enlargement, mechanical aortic valve appeared to function normally. - TEE (4/23): EF 50-55%, no regional WMAs, normal RV, no LA thrombus, no PFO/ASD, mechanical valve without significant regurgitation, mean gradient 7 mmHg. 4. Former smoker quit 2011. 5. Alcohol abuse but has cut back considerably.  6. ACEI cough. 7. Palpitations: Holter monitor (12/15) with occasional PVCs and PACs.  8. Hyperlipidemia  FH: Father- Heart Failure, Grandfather CAD CABG   SH: Quit smoking 2011. Heavy ETOH until 1/15.  Engaged.  Works as Freight forwarder at [employer].    ROS: All systems negative except as listed in HPI, PMH and Problem List.  Current Outpatient Medications  Medication Sig Dispense Refill   aspirin EC 81 MG tablet Take 1 tablet (81 mg total) by mouth daily. 90 tablet 3   carvedilol (COREG) 12.5 MG tablet TAKE 1 TABLET(12.5 MG) BY MOUTH TWICE DAILY WITH A MEAL 60 tablet 11   Ferrous Sulfate (IRON PO) Take 130 mg by mouth daily.     spironolactone (ALDACTONE) 25 MG tablet TAKE 1 TABLET(25 MG) BY MOUTH DAILY 90 tablet 3   valsartan (DIOVAN) 80 MG tablet Take 1 tablet (80 mg total) by mouth 2 (two) times daily. 180 tablet 3   warfarin (COUMADIN) 5 MG tablet TAKE 1-1 1/2 TABLETS DAILY AS DIRECTED 40 tablet 1   No current facility-administered medications for this encounter.   BP 114/78   Pulse (!) 108   Wt 83.3 kg (183 lb 9.6 oz)   SpO2 99%   BMI 27.92 kg/m   Wt Readings from Last 3 Encounters:  02/12/22 83.3 kg (183 lb 9.6 oz)  11/20/21 84.1 kg (185 lb 6.4 oz)  11/15/21 77.1 kg (170 lb)   PHYSICAL EXAM: General:  NAD. No resp difficulty, pale  and fatigued-appearing. HEENT: Normal Neck: Supple. No JVD. Carotids 2+ bilat; no bruits. No lymphadenopathy or thryomegaly appreciated. Cor: PMI nondisplaced. Tachy irregular rate & rhythm. No rubs, gallops or murmurs. + mechanical S2 Lungs: Clear Abdomen: Soft, nontender, nondistended. No hepatosplenomegaly. No bruits or masses. Good bowel sounds. Extremities: No cyanosis, clubbing, rash, edema Neuro: Alert & oriented x 3, cranial nerves grossly intact. Moves all 4 extremities w/o difficulty. Affect pleasant.  ASSESSMENT & PLAN: 1. Paroxysmal atrial fibrillation/flutter: s/p TEE/DCCV 08/01/13. Had Maze/LAA obliteration with Bentall.  Post-op atypical flutter.  No longer on amiodarone. - S/p DCCV 4/23 to NSR.    - Back in AFL w/ RVR on ECG today. From symptoms, likely went out of rhythm a couple days ago. - Discussed with Dr. Aundra Dubin, arrange for DCCV and will refer to EP to discuss  options. Likely needs AFL ablation. - Hold off on re-starting amio for now. 2. Chronic systolic CHF: Nonischemic cardiomyopathy.  Tachy-mediated CMP versus ETOH CMP versus long-standing aortic insufficiency or (most likely) a combination of all 3 possible causes.  He is no longer drinking and has had mechanical AVR.  Echo in 10/21 showed EF up to low normal, 50-55%. TEE (4/23) EF 50-55%, normal RV. NYHA class III (due to #1), he is not volume overloaded.   - Continue Coreg 12.5 mg bid.  - Continue valsartan 80 mg bid. Intolerant ace due to cough.  - Continue spironolactone 25 mg daily.  BMET today 3. Bicuspid aortic valve disorder with severe AI and severely dilated ascending aorta (5.1 cm on CTA).  Status post mechanical AVR + ascending aorta replacement.  The valve looked good on 7/15 echo and TEE 4/23. Episode of possible amaurosis fugax in the past.  He is now on ASA 81 mg daily (which will be continued) and on warfarin with goal INR 2.5-3.5. INR managed [facility].  - Reinforced need for SBE prophylaxis. - Check INR today, will forward to Coumadin Clinic. 4. ETOH abuse: Completely stopped.  5. HTN: Stable. Continue current regimen. 6. Hyperlipidemia: Lipids ok 12/22.   Follow up after cardioversion and with Dr. Aundra Dubin, as scheduled.  Maricela Bo [facility] FNP-BC 02/12/2022

## 2022-02-12 NOTE — Telephone Encounter (Signed)
I attempted to reach patient regarding results and recommendations per provider.  I left a message for a return call.

## 2022-02-14 ENCOUNTER — Encounter (HOSPITAL_COMMUNITY): Payer: Self-pay | Admitting: Cardiology

## 2022-02-14 ENCOUNTER — Ambulatory Visit (HOSPITAL_COMMUNITY): Payer: BC Managed Care – PPO | Admitting: Anesthesiology

## 2022-02-14 ENCOUNTER — Encounter (HOSPITAL_COMMUNITY)
Admission: RE | Disposition: A | Discharge status: Home / Self Care | Payer: Self-pay | Source: Home / Self Care | Attending: Cardiology

## 2022-02-14 ENCOUNTER — Ambulatory Visit (HOSPITAL_COMMUNITY)
Admission: RE | Admit: 2022-02-14 | Discharge: 2022-02-14 | Disposition: A | Discharge status: Home / Self Care | Payer: BC Managed Care – PPO | Attending: Cardiology | Admitting: Cardiology

## 2022-02-14 ENCOUNTER — Other Ambulatory Visit: Payer: Self-pay

## 2022-02-14 ENCOUNTER — Ambulatory Visit (INDEPENDENT_AMBULATORY_CARE_PROVIDER_SITE_OTHER): Payer: BC Managed Care – PPO | Admitting: *Deleted

## 2022-02-14 DIAGNOSIS — Z9889 Other specified postprocedural states: Secondary | ICD-10-CM

## 2022-02-14 DIAGNOSIS — I428 Other cardiomyopathies: Secondary | ICD-10-CM | POA: Diagnosis not present

## 2022-02-14 DIAGNOSIS — Q231 Congenital insufficiency of aortic valve: Secondary | ICD-10-CM | POA: Insufficient documentation

## 2022-02-14 DIAGNOSIS — Z954 Presence of other heart-valve replacement: Secondary | ICD-10-CM

## 2022-02-14 DIAGNOSIS — E785 Hyperlipidemia, unspecified: Secondary | ICD-10-CM | POA: Diagnosis not present

## 2022-02-14 DIAGNOSIS — I480 Paroxysmal atrial fibrillation: Secondary | ICD-10-CM

## 2022-02-14 DIAGNOSIS — I7121 Aneurysm of the ascending aorta, without rupture: Secondary | ICD-10-CM | POA: Diagnosis not present

## 2022-02-14 DIAGNOSIS — Z5181 Encounter for therapeutic drug level monitoring: Secondary | ICD-10-CM | POA: Diagnosis not present

## 2022-02-14 DIAGNOSIS — Z952 Presence of prosthetic heart valve: Secondary | ICD-10-CM | POA: Diagnosis not present

## 2022-02-14 DIAGNOSIS — I359 Nonrheumatic aortic valve disorder, unspecified: Secondary | ICD-10-CM | POA: Diagnosis not present

## 2022-02-14 DIAGNOSIS — Z79899 Other long term (current) drug therapy: Secondary | ICD-10-CM | POA: Diagnosis not present

## 2022-02-14 DIAGNOSIS — I4891 Unspecified atrial fibrillation: Secondary | ICD-10-CM | POA: Diagnosis not present

## 2022-02-14 DIAGNOSIS — Z7901 Long term (current) use of anticoagulants: Secondary | ICD-10-CM | POA: Insufficient documentation

## 2022-02-14 DIAGNOSIS — I110 Hypertensive heart disease with heart failure: Secondary | ICD-10-CM | POA: Insufficient documentation

## 2022-02-14 DIAGNOSIS — Z7982 Long term (current) use of aspirin: Secondary | ICD-10-CM | POA: Diagnosis not present

## 2022-02-14 DIAGNOSIS — F17210 Nicotine dependence, cigarettes, uncomplicated: Secondary | ICD-10-CM | POA: Diagnosis not present

## 2022-02-14 DIAGNOSIS — Z8679 Personal history of other diseases of the circulatory system: Secondary | ICD-10-CM

## 2022-02-14 DIAGNOSIS — I351 Nonrheumatic aortic (valve) insufficiency: Secondary | ICD-10-CM

## 2022-02-14 DIAGNOSIS — Z87891 Personal history of nicotine dependence: Secondary | ICD-10-CM | POA: Diagnosis not present

## 2022-02-14 DIAGNOSIS — I4892 Unspecified atrial flutter: Secondary | ICD-10-CM | POA: Diagnosis not present

## 2022-02-14 DIAGNOSIS — I509 Heart failure, unspecified: Secondary | ICD-10-CM | POA: Diagnosis not present

## 2022-02-14 DIAGNOSIS — I5022 Chronic systolic (congestive) heart failure: Secondary | ICD-10-CM | POA: Insufficient documentation

## 2022-02-14 HISTORY — PX: CARDIOVERSION: SHX1299

## 2022-02-14 LAB — POCT INR: INR: 3 (ref 2.0–3.0)

## 2022-02-14 SURGERY — CARDIOVERSION
Anesthesia: General

## 2022-02-14 MED ORDER — SODIUM CHLORIDE 0.9 % IV SOLN: INTRAVENOUS | Status: DC

## 2022-02-14 MED ORDER — LIDOCAINE 2% (20 MG/ML) 5 ML SYRINGE
INTRAMUSCULAR | Status: DC | PRN
  Administered 2022-02-14: 40 mg via INTRAVENOUS

## 2022-02-14 MED ORDER — PROPOFOL 10 MG/ML IV BOLUS
INTRAVENOUS | Status: DC | PRN
  Administered 2022-02-14: 60 mg via INTRAVENOUS

## 2022-02-14 NOTE — Discharge Instructions (Signed)

## 2022-02-14 NOTE — Procedures (Signed)
Electrical Cardioversion Procedure Note [name] Hinton [id number] Aug 19, 1970  Procedure: Electrical Cardioversion Indications:  Atrial Flutter  Procedure Details Consent: Risks of procedure as well as the alternatives and risks of each were explained to the (patient/caregiver).  Consent for procedure obtained. Time Out: Verified patient identification, verified procedure, site/side was marked, verified correct patient position, special equipment/implants available, medications/allergies/relevent history reviewed, required imaging and test results available.  Performed  Patient placed on cardiac monitor, pulse oximetry, supplemental oxygen as necessary.  Sedation given:  Propofol per anesthesiology Pacer pads placed anterior and posterior chest.  Cardioverted 1 time(s).  Cardioverted at [place].  Evaluation Findings: Post procedure EKG shows: NSR Complications: None Patient did tolerate procedure well.   [name] 02/14/2022, 1:05 PM

## 2022-02-14 NOTE — Patient Instructions (Signed)
Description   Continue taking Warfarin 1 tablet daily except for 1.5 tablets on Mondays, Wednesdays, and Fridays. Recheck INR in 1 week post Cardioversion (normally 6 weeks). Coumadin Clinic [phone], call with any changes in medication or up coming procedure.

## 2022-02-14 NOTE — Interval H&P Note (Signed)
History and Physical Interval Note:  02/14/2022 1:01 PM  [name] Hinton  has presented today for surgery, with the diagnosis of afib.  The various methods of treatment have been discussed with the patient and family. After consideration of risks, benefits and other options for treatment, the patient has consented to  Procedure(s): CARDIOVERSION (N/A) as a surgical intervention.  The patient's history has been reviewed, patient examined, no change in status, stable for surgery.  I have reviewed the patient's chart and labs.  Questions were answered to the patient's satisfaction.     Chao Blazejewski [employer]

## 2022-02-14 NOTE — Transfer of Care (Signed)
Immediate Anesthesia Transfer of Care Note  Patient: [name] Hinton  Procedure(s) Performed: CARDIOVERSION  Patient Location: Short Stay  Anesthesia Type:General  Level of Consciousness: awake  Airway & Oxygen Therapy: Patient Spontanous Breathing  Post-op Assessment: Report given to RN and Post -op Vital signs reviewed and stable  Post vital signs: Reviewed and stable  Last Vitals:  Vitals Value Taken Time  BP 96/68   Temp    Pulse 68   Resp 12   SpO2 97     Last Pain:  Vitals:   02/14/22 1200  PainSc: 0-No pain         Complications: No notable events documented.

## 2022-02-14 NOTE — Anesthesia Procedure Notes (Signed)
Procedure Name: General with mask airway Date/Time: 02/14/2022 1:04 PM  Performed by: Erick Colace, CRNAPre-anesthesia Checklist: Patient identified, Emergency Drugs available, Suction available and Patient being monitored Patient Re-evaluated:Patient Re-evaluated prior to induction Oxygen Delivery Method: Ambu bag Preoxygenation: Pre-oxygenation with 100% oxygen Induction Type: IV induction Dental Injury: Teeth and Oropharynx as per pre-operative assessment

## 2022-02-14 NOTE — Anesthesia Postprocedure Evaluation (Signed)
Anesthesia Post Note  Patient: [name] Hinton  Procedure(s) Performed: CARDIOVERSION     Patient location during evaluation: PACU Anesthesia Type: General Level of consciousness: awake and alert Pain management: pain level controlled Vital Signs Assessment: post-procedure vital signs reviewed and stable Respiratory status: spontaneous breathing, nonlabored ventilation, respiratory function stable and patient connected to nasal cannula oxygen Cardiovascular status: blood pressure returned to baseline and stable Postop Assessment: no apparent nausea or vomiting Anesthetic complications: no   No notable events documented.  Last Vitals:  Vitals:   02/14/22 1324 02/14/22 1333  BP: 97/72 101/70  Pulse: 65 66  Resp: 18 (!) 21  SpO2: 99% 99%    Last Pain:  Vitals:   02/14/22 1309  PainSc: 0-No pain                 Effie Berkshire

## 2022-02-14 NOTE — Anesthesia Preprocedure Evaluation (Addendum)
Anesthesia Evaluation  Patient identified by MRN, date of birth, ID band Patient awake    Reviewed: Allergy & Precautions, NPO status , Patient's Chart, lab work & pertinent test results, reviewed documented beta blocker date and time   Airway Mallampati: II  TM Distance: >3 FB Neck ROM: Full    Dental  (+) Teeth Intact, Dental Advisory Given   Pulmonary Current Smoker,    breath sounds clear to auscultation       Cardiovascular +CHF  + dysrhythmias Atrial Fibrillation  Rhythm:Irregular Rate:Normal  Echo: 1. Left ventricular ejection fraction, by estimation, is 50 to 55%. The  left ventricle has low normal function. The left ventricle has no regional  wall motion abnormalities.  2. Right ventricular systolic function is normal. The right ventricular  size is normal. Tricuspid regurgitation signal is inadequate for assessing  PA pressure.  3. The LA appendage has been ligated, no thrombus in the LA. Left atrial  size was mildly dilated. No left atrial/left atrial appendage thrombus was  detected.  4. No PFO/ASD by color doppler.  5. The mitral valve is normal in structure. Trivial mitral valve  regurgitation. No evidence of mitral stenosis.  6. Mechanical aortic valve without significant regurgitation, mean  gradient 7 mmHg.  7. 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 Normal caliber  thoracic aorta with minimal plaque.    Neuro/Psych negative neurological ROS  negative psych ROS   GI/Hepatic negative GI ROS, Neg liver ROS,   Endo/Other  negative endocrine ROS  Renal/GU negative Renal ROS     Musculoskeletal negative musculoskeletal ROS (+)   Abdominal Normal abdominal exam  (+)   Peds  Hematology negative hematology ROS (+)   Anesthesia Other Findings   Reproductive/Obstetrics                           Anesthesia Physical Anesthesia Plan  ASA: 3  Anesthesia  Plan: General   Post-op Pain Management:    Induction: Intravenous  PONV Risk Score and Plan: 0  Airway Management Planned: Natural Airway and Simple Face Mask  Additional Equipment: None  Intra-op Plan:   Post-operative Plan:   Informed Consent: I have reviewed the patients History and Physical, chart, labs and discussed the procedure including the risks, benefits and alternatives for the proposed anesthesia with the patient or authorized representative who has indicated his/her understanding and acceptance.       Plan Discussed with: CRNA  Anesthesia Plan Comments:        Anesthesia Quick Evaluation

## 2022-02-16 ENCOUNTER — Encounter (HOSPITAL_COMMUNITY): Payer: Self-pay | Admitting: Cardiology

## 2022-02-24 ENCOUNTER — Ambulatory Visit (INDEPENDENT_AMBULATORY_CARE_PROVIDER_SITE_OTHER): Payer: BC Managed Care – PPO | Admitting: *Deleted

## 2022-02-24 ENCOUNTER — Other Ambulatory Visit (HOSPITAL_COMMUNITY): Payer: BC Managed Care – PPO

## 2022-02-24 DIAGNOSIS — I480 Paroxysmal atrial fibrillation: Secondary | ICD-10-CM

## 2022-02-24 DIAGNOSIS — Z954 Presence of other heart-valve replacement: Secondary | ICD-10-CM

## 2022-02-24 DIAGNOSIS — I351 Nonrheumatic aortic (valve) insufficiency: Secondary | ICD-10-CM

## 2022-02-24 DIAGNOSIS — Z9889 Other specified postprocedural states: Secondary | ICD-10-CM

## 2022-02-24 DIAGNOSIS — Z8679 Personal history of other diseases of the circulatory system: Secondary | ICD-10-CM

## 2022-02-24 DIAGNOSIS — I359 Nonrheumatic aortic valve disorder, unspecified: Secondary | ICD-10-CM | POA: Diagnosis not present

## 2022-02-24 DIAGNOSIS — Z5181 Encounter for therapeutic drug level monitoring: Secondary | ICD-10-CM

## 2022-02-24 LAB — POCT INR: INR: 3 (ref 2.0–3.0)

## 2022-02-24 NOTE — Patient Instructions (Signed)
Description   Continue taking Warfarin 1 tablet daily except for 1.5 tablets on Mondays, Wednesdays, and Fridays. Recheck INR in 6 weeks. Coumadin Clinic [phone], call with any changes in medication or up coming procedure.

## 2022-02-28 ENCOUNTER — Telehealth: Payer: Self-pay | Admitting: Internal Medicine

## 2022-02-28 NOTE — Telephone Encounter (Signed)
Patient called saying HR is 120s. He thinks he is in AF/AFL. He can feel palpitations and knows when he is in AF. He denies syncope or lightheadedness. He has been compliant with his metoprolol and warfarin. INR checks show therapeutic level. I recommended he go to ED to be evaluated. He did not want to come in. His wife and him will discuss it.

## 2022-03-01 ENCOUNTER — Telehealth: Payer: Self-pay | Admitting: Physician Assistant

## 2022-03-01 NOTE — Telephone Encounter (Signed)
51 yo male with HFimpEF, AFib/Flutter, bicuspid aortic valve and ascending aortic aneurysm s/p ascending aorta replacement and mechanical AVR in 2015, on coumadin Rx. He just underwent DCCV 7/29 for recurrent AFlutter.  He called the answering service with recurrent symptoms of AFlutter in the last couple of days. His HR is 115-120. He feels fatigued. He is not having chest pain, shortness of breath, syncope, near syncope, edema.  He does not want to go to the ED.  I have asked him to increase his Coreg to 18.75 mg twice daily. I have asked him to reduce his Valsartan to 80 mg once daily to avoid hypotension. He knows to go to the ED for a change in symptoms over the weekend. I will send a message to the CHF clinic to get him in to be seen Monday to possibly arrange DCCV again.  Richardson Dopp, PA-C    03/01/2022 10:58 AM

## 2022-03-02 NOTE — Telephone Encounter (Signed)
Needs to be seen in clinic and DCCV scheduled again, but more importantly, he needs EP appt for ablation.  Has this been scheduled.

## 2022-03-03 NOTE — Telephone Encounter (Signed)
Referral appt made. Patient wanted to know about his Afib treatment between now and his appt. Advised Dr. Aundra Dubin recommended: Needs to be seen in clinic and DCCV scheduled again.   There office will be in touch to schedule.  Verbalized understanding.

## 2022-03-03 NOTE — Telephone Encounter (Signed)
Appt with Dr.McLean scheduled

## 2022-03-04 ENCOUNTER — Ambulatory Visit (HOSPITAL_COMMUNITY)
Admission: RE | Admit: 2022-03-04 | Discharge: 2022-03-04 | Disposition: A | Discharge status: Home / Self Care | Payer: BC Managed Care – PPO | Source: Ambulatory Visit | Attending: Cardiology | Admitting: Cardiology

## 2022-03-04 ENCOUNTER — Encounter (HOSPITAL_COMMUNITY): Payer: Self-pay | Admitting: Cardiology

## 2022-03-04 ENCOUNTER — Ambulatory Visit (INDEPENDENT_AMBULATORY_CARE_PROVIDER_SITE_OTHER): Payer: BC Managed Care – PPO | Admitting: *Deleted

## 2022-03-04 ENCOUNTER — Other Ambulatory Visit (HOSPITAL_COMMUNITY): Payer: Self-pay

## 2022-03-04 VITALS — BP 130/80 | HR 88 | Wt 178.6 lb

## 2022-03-04 DIAGNOSIS — E785 Hyperlipidemia, unspecified: Secondary | ICD-10-CM | POA: Diagnosis not present

## 2022-03-04 DIAGNOSIS — I480 Paroxysmal atrial fibrillation: Secondary | ICD-10-CM

## 2022-03-04 DIAGNOSIS — Z5181 Encounter for therapeutic drug level monitoring: Secondary | ICD-10-CM | POA: Diagnosis not present

## 2022-03-04 DIAGNOSIS — Z79899 Other long term (current) drug therapy: Secondary | ICD-10-CM | POA: Insufficient documentation

## 2022-03-04 DIAGNOSIS — Z7982 Long term (current) use of aspirin: Secondary | ICD-10-CM | POA: Diagnosis not present

## 2022-03-04 DIAGNOSIS — I484 Atypical atrial flutter: Secondary | ICD-10-CM | POA: Diagnosis not present

## 2022-03-04 DIAGNOSIS — F1010 Alcohol abuse, uncomplicated: Secondary | ICD-10-CM | POA: Diagnosis not present

## 2022-03-04 DIAGNOSIS — R0683 Snoring: Secondary | ICD-10-CM | POA: Diagnosis not present

## 2022-03-04 DIAGNOSIS — I428 Other cardiomyopathies: Secondary | ICD-10-CM | POA: Insufficient documentation

## 2022-03-04 DIAGNOSIS — Z954 Presence of other heart-valve replacement: Secondary | ICD-10-CM | POA: Diagnosis not present

## 2022-03-04 DIAGNOSIS — I110 Hypertensive heart disease with heart failure: Secondary | ICD-10-CM | POA: Diagnosis not present

## 2022-03-04 DIAGNOSIS — R5383 Other fatigue: Secondary | ICD-10-CM | POA: Diagnosis not present

## 2022-03-04 DIAGNOSIS — Q231 Congenital insufficiency of aortic valve: Secondary | ICD-10-CM | POA: Insufficient documentation

## 2022-03-04 DIAGNOSIS — Z952 Presence of prosthetic heart valve: Secondary | ICD-10-CM | POA: Diagnosis not present

## 2022-03-04 DIAGNOSIS — I359 Nonrheumatic aortic valve disorder, unspecified: Secondary | ICD-10-CM

## 2022-03-04 DIAGNOSIS — Z87891 Personal history of nicotine dependence: Secondary | ICD-10-CM | POA: Insufficient documentation

## 2022-03-04 DIAGNOSIS — I4892 Unspecified atrial flutter: Secondary | ICD-10-CM | POA: Insufficient documentation

## 2022-03-04 DIAGNOSIS — Z7901 Long term (current) use of anticoagulants: Secondary | ICD-10-CM | POA: Insufficient documentation

## 2022-03-04 DIAGNOSIS — I5022 Chronic systolic (congestive) heart failure: Secondary | ICD-10-CM | POA: Diagnosis not present

## 2022-03-04 LAB — CBC
HCT: 33.3 % — ABNORMAL LOW (ref 39.0–52.0)
Hemoglobin: 10 g/dL — ABNORMAL LOW (ref 13.0–17.0)
MCH: 28.7 pg (ref 26.0–34.0)
MCHC: 30 g/dL (ref 30.0–36.0)
MCV: 95.7 fL (ref 80.0–100.0)
Platelets: 296 10*3/uL (ref 150–400)
RBC: 3.48 MIL/uL — ABNORMAL LOW (ref 4.22–5.81)
RDW: 16.1 % — ABNORMAL HIGH (ref 11.5–15.5)
WBC: 5.1 10*3/uL (ref 4.0–10.5)
nRBC: 0 % (ref 0.0–0.2)

## 2022-03-04 LAB — PROTIME-INR
INR: 1.9 — ABNORMAL HIGH (ref 0.8–1.2)
Prothrombin Time: 21.3 seconds — ABNORMAL HIGH (ref 11.4–15.2)

## 2022-03-04 LAB — BASIC METABOLIC PANEL
Anion gap: 4 — ABNORMAL LOW (ref 5–15)
BUN: 13 mg/dL (ref 6–20)
CO2: 27 mmol/L (ref 22–32)
Calcium: 9.2 mg/dL (ref 8.9–10.3)
Chloride: 107 mmol/L (ref 98–111)
Creatinine, Ser: 0.83 mg/dL (ref 0.61–1.24)
GFR, Estimated: >60 mL/min (ref >60)
Glucose, Bld: 108 mg/dL — ABNORMAL HIGH (ref 70–99)
Potassium: 4.9 mmol/L (ref 3.5–5.1)
Sodium: 138 mmol/L (ref 135–145)

## 2022-03-04 MED ORDER — AMIODARONE HCL 200 MG PO TABS: ORAL_TABLET | ORAL | 0 refills | Status: DC

## 2022-03-04 NOTE — Patient Instructions (Signed)
Start Amiodarone '40mg'$  Twice daily for 7 days, then '200mg'$  Twice daily For 7 days, then '200mg'$  daily   Labs done today, your results will be available in MyChart, we will contact you for abnormal readings.  Your provider has recommended that you have a home sleep study.  We have provided you with the equipment in our office today. Please download the app and follow the instructions. YOUR PIN NUMBER IS: [id number]. Once you have completed the test you just dispose of the equipment, the information is automatically uploaded to [place] via blue-tooth technology. If your test is positive for sleep apnea and you need a home CPAP machine you will be contacted by Dr Theodosia Blender office [facility]) to set this up.   You are scheduled for a  Cardioversion on August 21st  with Dr. Aundra Dubin .  Please arrive at the [facility] (Main Entrance A) at [facility]: [address] [place], [place] [address] at 6.30 am. (1 hour prior to procedure unless lab work is needed; if lab work is needed arrive 1.5 hours ahead)  DIET: Nothing to eat or drink after midnight except a sip of water with medications (see medication instructions below)  FYI: For your safety, and to allow [place] to monitor your vital signs accurately during the surgery/procedure we request that   if you have artificial nails, gel coating, SNS etc. Please have those removed prior to your surgery/procedure. Not having the nail coverings /polish removed may result in cancellation or delay of your surgery/procedure.   Medication Instructions: Hold Furosemide and Spironolactone morning of procedure   Continue your anticoagulant: warfarin  You will need to continue your anticoagulant after your procedure until you  are told by your  Provider that it is safe to stop   Labs:  your lab work will be done at the hospital prior to your procedure - you will need to arrive 1  hours ahead of your procedure  You must have a responsible person to drive you home and  stay in the waiting area during your procedure. Failure to do so could result in cancellation.  Bring your insurance cards.  *Special Note: Every effort is made to have your procedure done on time. Occasionally there are emergencies that occur at the hospital that may cause delays. Please be patient if a delay does occur.    Your physician recommends that you schedule a follow-up appointment in: 1 month   If you have any questions or concerns before your next appointment please send [place] a message through [place] or call our office at [phone].    TO LEAVE A MESSAGE FOR THE NURSE SELECT OPTION 2, PLEASE LEAVE A MESSAGE INCLUDING: YOUR NAME DATE OF BIRTH CALL BACK NUMBER REASON FOR CALL**this is important as we prioritize the call backs  YOU WILL RECEIVE A CALL BACK THE SAME DAY AS LONG AS YOU CALL BEFORE 4:00 PM  At the [facility], you and your health needs are our priority. As part of our continuing mission to provide you with exceptional heart care, we have created designated Provider Care Teams. These Care Teams include your primary Cardiologist (physician) and Advanced Practice Providers (APPs- Physician Assistants and Nurse Practitioners) who all work together to provide you with the care you need, when you need it.   You may see any of the following providers on your designated Care Team at your next follow up: Dr Glori Bickers Dr Haynes Kerns, NP Lyda Jester, [place]  [facility] Marlyce Huge, [place] Audry Riles, PharmD   Please be sure to bring in all your medications bottles to every appointment.

## 2022-03-04 NOTE — H&P (View-Only) (Signed)
Patient ID: [name] Hinton, male   DOB: Mar 21, 1971, 51 y.o.   MRN: [id number] PCP: Dr. Ashby Dawes Cardiology: Dr. Aundra Dubin  [name] Hinton is a 51 y.o.with a prior history of alcohol abuse and former smoker (quit  2011). Hospitalized 1/15 with increased dyspnea-->ECHO performed and showed EF 25%, global hypokinesis, RV mildly dilated,severe aortic insufficiency.  Patient had new-onset acute systolic CHF and atrial flutter. Started on cardizem drip and Xarelto. Initially in A flutter--> became A fib.   He had TEE and DC-CV on 08/01/13.  Dr Roxy Manns provided consultation due to ascending aortic aneurysm and bicuspid valve with severe AI. He was discharged on ACEI, beta blocker, spironolactone, and xarelto. Discharge weight 150 pounds.    Had cough with lisinopril and switched to losartan.  LHC/RHC was done 2/15 showing no coronary disease and elevated PCWP with pulmonary hypertension.   In 3/15, he had replacement of ascending aortic and aortic valve with Bentall procedure with mechanical aortic valve.  He also had Maze procedure and LAA obliteration.  He was noted to have gone into atypical atrial flutter by the time of his 3/30 followup with Dr. Roxy Manns.  He was started on amiodarone and went back into NSR. He is back at work.  Echo in 4/15 showed normally functioning mechanical aortic valve, but EF remained 30-35%.   Echo in 4/15 showed normally functioning mechanical aortic valve, but EF remained 30-35%.  ECHO 01/2014  EF up to 45% with normal mechanical aortic valve.  Echo 9/17 with increase in EF to 55-60% with mean gradient 13 mmHg across mechanical AoV.    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  No other neurological symptoms.   In 3/18, he was admitted with lower GI bleeding/symptomatic anemia.  He received 2 units PRBCs.  Suspected to be hemorrhoidal.  He had a colonoscopy confirming hemorrhoids and showing 1 polyp that was removed.    Echo in 11/19 showed EF 55% with mild LVH,  normal RV size with mildly decreased systolic function, normal mechanical aortic valve. Echo in 10/21 showed EF 50-55%, normal RV, moderate biatrial enlargement, mechanical aortic valve appeared to function normally.   Follow up 12/22, stable NYHA I symptoms and no longer drinking ETOH. Remained in NSR.   Back in AFL RVR 4/23. Underwent TEE and DCCV 11/15/21 --> NSR. EF 50-55%, normal RV, no LAA, AV ok, mean gradient 7 mmHg.  Patient had recurrent atypical atrial flutter with DCCV to NSR in 7/23.    Patient was worked in today due to recurrent palpitations/tachycardia.  He thinks he held sinus rhythm for about 2 wks after 7/23 DCCV.  He is in atypical atrial flutter today.  He feels palpitations and feels more fatigued than when in NSR. He reports snoring though he does not have daytime sleepiness.  He has quit drinking ETOH.  Weight down 5 lbs.  He works full time at [employer]. No lightheadedness, no chest pain.  No dyspnea with ADLs.   ECG (personally reviewed): Atypical atrial flutter rate 87  Labs (1/15): K 5.2, creatinine 1.3, AST 40, ALT 58 Labs (2/15): K 4.7, creatinine 1.1 Labs (4/15): K 4.9, creatinine 1.0, LFTs normal, TSH normal Labs (5/15): K 4.6, creatinine 1.1, BNP 134 Labs (6/15): K 4.8  creatinine 1.0. INR 2.4 Labs (03/01/14): K 4.7 Creatinine 1.16  Labs (11/15): K 4.8, creatinine 0.79 Labs (3/18): K 4.4, creatinine 0.8, hgb 10.2 Labs (7/18): K 4.2, creatinine 0.86, hgb 9.8 Labs (7/21): LDL 146, K 4.4,  creatinine 0.92, hgb 13.5 Labs (12/22): LDL 116, HDL 82 Labs (4/23): K 4.0, creatinine 0.89, hgb 13.4 Labs (5/23): K 4.8, creatinine 0.88 Labs (7/23): K 4.2, creatinine 0.89, BNP 399  PMH:  1. A fib/Aflutter: Initially flutter during 1/15 admission, degenerated to fibrillation --> S/P TEE DC-CV to NSR. Atypical atrial flutter recurred post-op in 3/15. 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  - Atypical flutter s/p TEE with DCCV 4/23 --> NSR. - Atypical flutter s/p DCCV  7/23 to NSR 2. 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  TEE (1/15) with EF 25%, moderate LV dilation, bicuspid aortic valve with severe eccentric AI and no AS, ascending aorta 5.0 cm, moderately decreased RV systolic function.  CTA chest (1/15) with 5.1 cm ascending aortic aneurysm. Patient had Bentall replacement of ascending aorta with mechanical aortic valve and Maze with LAA obliteration in 3/15.  - TEE (4/23): mean gradient 7 mmHg. 3. Cardiomyopathy with systolic CHF: As above, TEE with EF 25% and moderately dilated LV, moderately decreased RV systolic function.  Tachy-mediated CMP versus ETOH CMP versus long-standing AI versus a combination of the three.  LHC/RHC (2/15): no coronary disease, mean RA 5, PA 62/25, mean PCWP 22, CI 1.8, PVR 5.2 WU, LVEDP 35 (severe AI).  Echo (2/15): EF 35-40%, moderately dilated LV, bicuspid aortic valve with severe AI.  Echo (4/15) with mildly dilated LV, mild LVH, EF 30-35%, normal RV size and systolic function, mechanical aortic valve appeared to function normally, IVC small.  Echo (7/15) with EF 45%, normal mechanical aortic valve, normal RV size with mildly decreased systolic function.  - Echo (9/17) with EF 55-60%, mechanical aortic valve with mean gradient 13 mmHg.  - Echo (11/19) with EF 55% with mild LVH, normal RV size with mildly decreased systolic function, normal mechanical aortic valve.  - Echo (10/21): EF 50-55%, normal RV, moderate biatrial enlargement, mechanical aortic valve appeared to function normally. - TEE (4/23): EF 50-55%, no regional WMAs, normal RV, no LA thrombus, no PFO/ASD, mechanical valve without significant regurgitation, mean gradient 7 mmHg. 4. Former smoker quit 2011. 5. Alcohol abuse but has cut back considerably.  6. ACEI cough. 7. Palpitations: Holter monitor (12/15) with occasional PVCs and PACs.  8. Hyperlipidemia  FH: Father- Heart Failure, Grandfather  CAD CABG   SH: Quit smoking 2011. Heavy ETOH until 1/15.  Engaged.  Works as Freight forwarder at [employer].   ROS: All systems negative except as listed in HPI, PMH and Problem List.  Current Outpatient Medications  Medication Sig Dispense Refill   amiodarone (PACERONE) 200 MG tablet Take 2 tablets (400 mg total) by mouth 2 (two) times daily for 7 days, THEN 1 tablet (200 mg total) 2 (two) times daily for 7 days, THEN 1 tablet (200 mg total) daily. 132 tablet 0   aspirin EC 81 MG tablet Take 1 tablet (81 mg total) by mouth daily. 90 tablet 3   carvedilol (COREG) 12.5 MG tablet TAKE 1 TABLET(12.5 MG) BY MOUTH TWICE DAILY WITH A MEAL 60 tablet 11   Ferrous Sulfate (IRON PO) Take 130 mg by mouth daily.     furosemide (LASIX) 20 MG tablet Take 1 tablet (20 mg total) by mouth as needed. 45 tablet 1   potassium chloride SA (KLOR-CON M) 20 MEQ tablet Take 1 tablet (20 mEq total) by mouth as needed. 20 tablet 4   spironolactone (ALDACTONE) 25 MG tablet TAKE 1 TABLET(25 MG) BY MOUTH DAILY 90 tablet 3  valsartan (DIOVAN) 80 MG tablet Take 1 tablet (80 mg total) by mouth 2 (two) times daily. 180 tablet 3   warfarin (COUMADIN) 5 MG tablet TAKE 1-1 1/2 TABLETS DAILY AS DIRECTED 40 tablet 1   No current facility-administered medications for this encounter.   BP 130/80   Pulse 88   Wt 81 kg (178 lb 9.6 oz)   SpO2 100%   BMI 27.16 kg/m   Wt Readings from Last 3 Encounters:  03/04/22 81 kg (178 lb 9.6 oz)  02/14/22 81.6 kg (180 lb)  02/12/22 83.3 kg (183 lb 9.6 oz)   PHYSICAL EXAM: General: NAD Neck: No JVD, no thyromegaly or thyroid nodule.  Lungs: Clear to auscultation bilaterally with normal respiratory effort. CV: Nondisplaced PMI.  Heart regular S1/S2 with mechanical S2, no S3/S4, 1/6 SEM RUSB.  No peripheral edema.  No carotid bruit.  Normal pedal pulses.  Abdomen: Soft, nontender, no hepatosplenomegaly, no distention.  Skin: Intact without lesions or rashes.  Neurologic: Alert and oriented x 3.   Psych: Normal affect. Extremities: No clubbing or cyanosis.  HEENT: Normal.   ASSESSMENT & PLAN: 1. Paroxysmal atrial fibrillation/flutter: s/p TEE/DCCV 08/01/13. Had Maze/LAA obliteration with Bentall.  Post-op atypical flutter.  No longer on amiodarone.  He had DCCV in 4/23 and again in 7/23 with atypical flutter, now back in atypical flutter today. He is symptomatic with fatigue.  He has quit drinking ETOH and has started losing weight.  He does report snoring.  - He is not holding sinus without antiarrhythmic at this point.  Start amiodarone 400 mg bid x 1 week, then 200 mg bid x 1 week, then 200 mg daily.  - He does not feel well in atrial flutter.  I will arrange for TEE-guided DCCV next week (INR only 1.9 today, coumadin clinic to adjust). Need to check INR day of procedure to make sure therapeutic. We discussed risks/benefits of TEE-guided DCCV and he agrees to proceed.  - He has appointment with EP to discuss atrial flutter ablation, this would hopefully allow him to stop amiodarone. - He should remain off ETOH (prior heavy ETOH), work on weight loss, and will need sleep study to assess for OSA.  2. Chronic systolic CHF: Nonischemic cardiomyopathy.  Tachy-mediated CMP versus ETOH CMP versus long-standing aortic insufficiency or (most likely) a combination of all 3 possible causes.  He is no longer drinking and has had mechanical AVR.  Echo in 10/21 showed EF up to low normal, 50-55%. TEE (4/23) EF 50-55%, normal RV. NYHA class II, not volume overloaded on exam.  - Continue Coreg 12.5 mg bid.  - Continue valsartan 80 mg bid. Intolerant ace due to cough.  - Continue spironolactone 25 mg daily.  BMET today 3. Bicuspid aortic valve disorder with severe AI and severely dilated ascending aorta (5.1 cm on CTA).  Status post mechanical AVR + ascending aorta replacement.  The valve looked good on 7/15 echo and TEE 4/23. Episode of possible amaurosis fugax in the past.  He is now on ASA 81 mg daily  (which will be continued) and on warfarin with goal INR 2.5-3.5. INR managed Coumadin Clinic.  - Reinforced need for SBE prophylaxis. 4. ETOH abuse: Completely stopped since last DCCV.  5. HTN: Stable. Continue current regimen. 6. Hyperlipidemia: Lipids ok 12/22.  7. Suspect OSA: I will arrange for home sleep study.   Followup in 1 month with APP.   [name]  03/04/2022

## 2022-03-04 NOTE — Addendum Note (Signed)
Encounter addended by: [name], MD on: 03/04/2022 11:33 PM  Actions taken: Level of Service modified

## 2022-03-04 NOTE — [place] Notes (Signed)
Height:     Weight: BMI:  Today's Date:  STOP BANG RISK ASSESSMENT S (snore) Have you been told that you snore?     YES   T (tired) Are you often tired, fatigued, or sleepy during the day?   YES  O (obstruction) Do you stop breathing, choke, or gasp during sleep? NO   P (pressure) Do you have or are you being treated for high blood pressure? YES   B (BMI) Is your body index greater than 35 kg/m? NO   A (age) Are you 49 years old or older? NO   N (neck) Do you have a neck circumference greater than 16 inches?   NO   G (gender) Are you a male? YES   TOTAL STOP/BANG "YES" ANSWERS 4                                                                       For Office Use Only              Procedure Order Form    YES to 3+ Stop Bang questions OR two clinical symptoms - patient qualifies for WatchPAT (CPT 95800)      Clinical Notes: Will consult Sleep Specialist and refer for management of therapy due to patient increased risk of Sleep Apnea. Ordering a sleep study due to the following two clinical symptoms: Excessive daytime sleepiness G47.10 / Gastroesophageal reflux K21.9 / Nocturia R35.1 / Morning Headaches G44.221 / Difficulty concentrating R41.840 / Memory problems or poor judgment G31.84 / Personality changes or irritability R45.4 / Loud snoring R06.83 / Depression F32.9 / Unrefreshed by sleep G47.8 / Impotence N52.9 / History of high blood pressure R03.0 / Insomnia G47.00

## 2022-03-04 NOTE — Progress Notes (Signed)
Patient ID: [name] Hinton, male   DOB: Jan 14, 1971, 51 y.o.   MRN: [id number] PCP: Dr. Ashby Dawes Cardiology: Dr. Aundra Dubin  [name] Hinton is a 51 y.o.with a prior history of alcohol abuse and former smoker (quit  2011). Hospitalized 1/15 with increased dyspnea-->ECHO performed and showed EF 25%, global hypokinesis, RV mildly dilated,severe aortic insufficiency.  Patient had new-onset acute systolic CHF and atrial flutter. Started on cardizem drip and Xarelto. Initially in A flutter--> became A fib.   He had TEE and DC-CV on 08/01/13.  Dr Roxy Manns provided consultation due to ascending aortic aneurysm and bicuspid valve with severe AI. He was discharged on ACEI, beta blocker, spironolactone, and xarelto. Discharge weight 150 pounds.    Had cough with lisinopril and switched to losartan.  LHC/RHC was done 2/15 showing no coronary disease and elevated PCWP with pulmonary hypertension.   In 3/15, he had replacement of ascending aortic and aortic valve with Bentall procedure with mechanical aortic valve.  He also had Maze procedure and LAA obliteration.  He was noted to have gone into atypical atrial flutter by the time of his 3/30 followup with Dr. Roxy Manns.  He was started on amiodarone and went back into NSR. He is back at work.  Echo in 4/15 showed normally functioning mechanical aortic valve, but EF remained 30-35%.   Echo in 4/15 showed normally functioning mechanical aortic valve, but EF remained 30-35%.  ECHO 01/2014  EF up to 45% with normal mechanical aortic valve.  Echo 9/17 with increase in EF to 55-60% with mean gradient 13 mmHg across mechanical AoV.    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  No other neurological symptoms.   In 3/18, he was admitted with lower GI bleeding/symptomatic anemia.  He received 2 units PRBCs.  Suspected to be hemorrhoidal.  He had a colonoscopy confirming hemorrhoids and showing 1 polyp that was removed.    Echo in 11/19 showed EF 55% with mild LVH,  normal RV size with mildly decreased systolic function, normal mechanical aortic valve. Echo in 10/21 showed EF 50-55%, normal RV, moderate biatrial enlargement, mechanical aortic valve appeared to function normally.   Follow up 12/22, stable NYHA I symptoms and no longer drinking ETOH. Remained in NSR.   Back in AFL RVR 4/23. Underwent TEE and DCCV 11/15/21 --> NSR. EF 50-55%, normal RV, no LAA, AV ok, mean gradient 7 mmHg.  Patient had recurrent atypical atrial flutter with DCCV to NSR in 7/23.    Patient was worked in today due to recurrent palpitations/tachycardia.  He thinks he held sinus rhythm for about 2 wks after 7/23 DCCV.  He is in atypical atrial flutter today.  He feels palpitations and feels more fatigued than when in NSR. He reports snoring though he does not have daytime sleepiness.  He has quit drinking ETOH.  Weight down 5 lbs.  He works full time at [employer]. No lightheadedness, no chest pain.  No dyspnea with ADLs.   ECG (personally reviewed): Atypical atrial flutter rate 87  Labs (1/15): K 5.2, creatinine 1.3, AST 40, ALT 58 Labs (2/15): K 4.7, creatinine 1.1 Labs (4/15): K 4.9, creatinine 1.0, LFTs normal, TSH normal Labs (5/15): K 4.6, creatinine 1.1, BNP 134 Labs (6/15): K 4.8  creatinine 1.0. INR 2.4 Labs (03/01/14): K 4.7 Creatinine 1.16  Labs (11/15): K 4.8, creatinine 0.79 Labs (3/18): K 4.4, creatinine 0.8, hgb 10.2 Labs (7/18): K 4.2, creatinine 0.86, hgb 9.8 Labs (7/21): LDL 146, K 4.4,  creatinine 0.92, hgb 13.5 Labs (12/22): LDL 116, HDL 82 Labs (4/23): K 4.0, creatinine 0.89, hgb 13.4 Labs (5/23): K 4.8, creatinine 0.88 Labs (7/23): K 4.2, creatinine 0.89, BNP 399  PMH:  1. A fib/Aflutter: Initially flutter during 1/15 admission, degenerated to fibrillation --> S/P TEE DC-CV to NSR. Atypical atrial flutter recurred post-op in 3/15. 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  - Atypical flutter s/p TEE with DCCV 4/23 --> NSR. - Atypical flutter s/p DCCV  7/23 to NSR 2. 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  TEE (1/15) with EF 25%, moderate LV dilation, bicuspid aortic valve with severe eccentric AI and no AS, ascending aorta 5.0 cm, moderately decreased RV systolic function.  CTA chest (1/15) with 5.1 cm ascending aortic aneurysm. Patient had Bentall replacement of ascending aorta with mechanical aortic valve and Maze with LAA obliteration in 3/15.  - TEE (4/23): mean gradient 7 mmHg. 3. Cardiomyopathy with systolic CHF: As above, TEE with EF 25% and moderately dilated LV, moderately decreased RV systolic function.  Tachy-mediated CMP versus ETOH CMP versus long-standing AI versus a combination of the three.  LHC/RHC (2/15): no coronary disease, mean RA 5, PA 62/25, mean PCWP 22, CI 1.8, PVR 5.2 WU, LVEDP 35 (severe AI).  Echo (2/15): EF 35-40%, moderately dilated LV, bicuspid aortic valve with severe AI.  Echo (4/15) with mildly dilated LV, mild LVH, EF 30-35%, normal RV size and systolic function, mechanical aortic valve appeared to function normally, IVC small.  Echo (7/15) with EF 45%, normal mechanical aortic valve, normal RV size with mildly decreased systolic function.  - Echo (9/17) with EF 55-60%, mechanical aortic valve with mean gradient 13 mmHg.  - Echo (11/19) with EF 55% with mild LVH, normal RV size with mildly decreased systolic function, normal mechanical aortic valve.  - Echo (10/21): EF 50-55%, normal RV, moderate biatrial enlargement, mechanical aortic valve appeared to function normally. - TEE (4/23): EF 50-55%, no regional WMAs, normal RV, no LA thrombus, no PFO/ASD, mechanical valve without significant regurgitation, mean gradient 7 mmHg. 4. Former smoker quit 2011. 5. Alcohol abuse but has cut back considerably.  6. ACEI cough. 7. Palpitations: Holter monitor (12/15) with occasional PVCs and PACs.  8. Hyperlipidemia  FH: Father- Heart Failure, Grandfather  CAD CABG   SH: Quit smoking 2011. Heavy ETOH until 1/15.  Engaged.  Works as Freight forwarder at [employer].   ROS: All systems negative except as listed in HPI, PMH and Problem List.  Current Outpatient Medications  Medication Sig Dispense Refill   amiodarone (PACERONE) 200 MG tablet Take 2 tablets (400 mg total) by mouth 2 (two) times daily for 7 days, THEN 1 tablet (200 mg total) 2 (two) times daily for 7 days, THEN 1 tablet (200 mg total) daily. 132 tablet 0   aspirin EC 81 MG tablet Take 1 tablet (81 mg total) by mouth daily. 90 tablet 3   carvedilol (COREG) 12.5 MG tablet TAKE 1 TABLET(12.5 MG) BY MOUTH TWICE DAILY WITH A MEAL 60 tablet 11   Ferrous Sulfate (IRON PO) Take 130 mg by mouth daily.     furosemide (LASIX) 20 MG tablet Take 1 tablet (20 mg total) by mouth as needed. 45 tablet 1   potassium chloride SA (KLOR-CON M) 20 MEQ tablet Take 1 tablet (20 mEq total) by mouth as needed. 20 tablet 4   spironolactone (ALDACTONE) 25 MG tablet TAKE 1 TABLET(25 MG) BY MOUTH DAILY 90 tablet 3  valsartan (DIOVAN) 80 MG tablet Take 1 tablet (80 mg total) by mouth 2 (two) times daily. 180 tablet 3   warfarin (COUMADIN) 5 MG tablet TAKE 1-1 1/2 TABLETS DAILY AS DIRECTED 40 tablet 1   No current facility-administered medications for this encounter.   BP 130/80   Pulse 88   Wt 81 kg (178 lb 9.6 oz)   SpO2 100%   BMI 27.16 kg/m   Wt Readings from Last 3 Encounters:  03/04/22 81 kg (178 lb 9.6 oz)  02/14/22 81.6 kg (180 lb)  02/12/22 83.3 kg (183 lb 9.6 oz)   PHYSICAL EXAM: General: NAD Neck: No JVD, no thyromegaly or thyroid nodule.  Lungs: Clear to auscultation bilaterally with normal respiratory effort. CV: Nondisplaced PMI.  Heart regular S1/S2 with mechanical S2, no S3/S4, 1/6 SEM RUSB.  No peripheral edema.  No carotid bruit.  Normal pedal pulses.  Abdomen: Soft, nontender, no hepatosplenomegaly, no distention.  Skin: Intact without lesions or rashes.  Neurologic: Alert and oriented x 3.   Psych: Normal affect. Extremities: No clubbing or cyanosis.  HEENT: Normal.   ASSESSMENT & PLAN: 1. Paroxysmal atrial fibrillation/flutter: s/p TEE/DCCV 08/01/13. Had Maze/LAA obliteration with Bentall.  Post-op atypical flutter.  No longer on amiodarone.  He had DCCV in 4/23 and again in 7/23 with atypical flutter, now back in atypical flutter today. He is symptomatic with fatigue.  He has quit drinking ETOH and has started losing weight.  He does report snoring.  - He is not holding sinus without antiarrhythmic at this point.  Start amiodarone 400 mg bid x 1 week, then 200 mg bid x 1 week, then 200 mg daily.  - He does not feel well in atrial flutter.  I will arrange for TEE-guided DCCV next week (INR only 1.9 today, coumadin clinic to adjust). Need to check INR day of procedure to make sure therapeutic. We discussed risks/benefits of TEE-guided DCCV and he agrees to proceed.  - He has appointment with EP to discuss atrial flutter ablation, this would hopefully allow him to stop amiodarone. - He should remain off ETOH (prior heavy ETOH), work on weight loss, and will need sleep study to assess for OSA.  2. Chronic systolic CHF: Nonischemic cardiomyopathy.  Tachy-mediated CMP versus ETOH CMP versus long-standing aortic insufficiency or (most likely) a combination of all 3 possible causes.  He is no longer drinking and has had mechanical AVR.  Echo in 10/21 showed EF up to low normal, 50-55%. TEE (4/23) EF 50-55%, normal RV. NYHA class II, not volume overloaded on exam.  - Continue Coreg 12.5 mg bid.  - Continue valsartan 80 mg bid. Intolerant ace due to cough.  - Continue spironolactone 25 mg daily.  BMET today 3. Bicuspid aortic valve disorder with severe AI and severely dilated ascending aorta (5.1 cm on CTA).  Status post mechanical AVR + ascending aorta replacement.  The valve looked good on 7/15 echo and TEE 4/23. Episode of possible amaurosis fugax in the past.  He is now on ASA 81 mg daily  (which will be continued) and on warfarin with goal INR 2.5-3.5. INR managed Coumadin Clinic.  - Reinforced need for SBE prophylaxis. 4. ETOH abuse: Completely stopped since last DCCV.  5. HTN: Stable. Continue current regimen. 6. Hyperlipidemia: Lipids ok 12/22.  7. Suspect OSA: I will arrange for home sleep study.   Followup in 1 month with APP.   [name]  03/04/2022

## 2022-03-05 ENCOUNTER — Other Ambulatory Visit: Payer: Self-pay

## 2022-03-05 MED ORDER — SPIRONOLACTONE 25 MG PO TABS: ORAL_TABLET | ORAL | 2 refills | Status: DC

## 2022-03-09 NOTE — Anesthesia Preprocedure Evaluation (Addendum)
Anesthesia Evaluation  Patient identified by MRN, date of birth, ID band Patient awake    Reviewed: Allergy & Precautions, NPO status , Patient's Chart, lab work & pertinent test results  History of Anesthesia Complications Negative for: history of anesthetic complications  Airway Mallampati: II  TM Distance: >3 FB Neck ROM: Full    Dental  (+) Dental Advisory Given   Pulmonary former smoker,    Pulmonary exam normal        Cardiovascular +CHF  + dysrhythmias Atrial Fibrillation + Valvular Problems/Murmurs (bicuspid aortic valves s/p Bentall)  Rhythm:Irregular Rate:Tachycardia   TEE 11/15/21: EF 50-55%, no RWMA, normal RVSF, LA appendage has been ligated, no thrombus in LA, trivial MR, mechanical AV w/o significant regurg, MG 7, aortic root replacement s/p Bentall, measures 4.2 cm   Neuro/Psych negative neurological ROS     GI/Hepatic negative GI ROS, (+)     substance abuse  alcohol use,   Endo/Other  negative endocrine ROS  Renal/GU negative Renal ROS  negative genitourinary   Musculoskeletal negative musculoskeletal ROS (+)   Abdominal   Peds  Hematology  (+) Blood dyscrasia, anemia ,   Anesthesia Other Findings   Reproductive/Obstetrics                          Anesthesia Physical Anesthesia Plan  ASA: 3  Anesthesia Plan: MAC   Post-op Pain Management: Minimal or no pain anticipated   Induction: Intravenous  PONV Risk Score and Plan: 1 and Propofol infusion, TIVA and Treatment may vary due to age or medical condition  Airway Management Planned: Natural Airway, Nasal Cannula and Simple Face Mask  Additional Equipment: None  Intra-op Plan:   Post-operative Plan:   Informed Consent: I have reviewed the patients History and Physical, chart, labs and discussed the procedure including the risks, benefits and alternatives for the proposed anesthesia with the patient or  authorized representative who has indicated his/her understanding and acceptance.       Plan Discussed with:   Anesthesia Plan Comments:       Anesthesia Quick Evaluation

## 2022-03-10 ENCOUNTER — Ambulatory Visit (HOSPITAL_COMMUNITY): Payer: BC Managed Care – PPO | Admitting: Anesthesiology

## 2022-03-10 ENCOUNTER — Encounter (HOSPITAL_COMMUNITY): Payer: Self-pay | Admitting: Cardiology

## 2022-03-10 ENCOUNTER — Ambulatory Visit (HOSPITAL_COMMUNITY)
Admission: RE | Admit: 2022-03-10 | Discharge: 2022-03-10 | Disposition: A | Discharge status: Home / Self Care | Payer: BC Managed Care – PPO | Attending: Cardiology | Admitting: Cardiology

## 2022-03-10 ENCOUNTER — Ambulatory Visit (HOSPITAL_BASED_OUTPATIENT_CLINIC_OR_DEPARTMENT_OTHER): Payer: BC Managed Care – PPO

## 2022-03-10 ENCOUNTER — Encounter (HOSPITAL_COMMUNITY)
Admission: RE | Disposition: A | Discharge status: Home / Self Care | Payer: Self-pay | Source: Home / Self Care | Attending: Cardiology

## 2022-03-10 ENCOUNTER — Other Ambulatory Visit: Payer: Self-pay

## 2022-03-10 DIAGNOSIS — Z952 Presence of prosthetic heart valve: Secondary | ICD-10-CM | POA: Diagnosis not present

## 2022-03-10 DIAGNOSIS — Z87891 Personal history of nicotine dependence: Secondary | ICD-10-CM | POA: Insufficient documentation

## 2022-03-10 DIAGNOSIS — Z7982 Long term (current) use of aspirin: Secondary | ICD-10-CM | POA: Diagnosis not present

## 2022-03-10 DIAGNOSIS — E785 Hyperlipidemia, unspecified: Secondary | ICD-10-CM | POA: Insufficient documentation

## 2022-03-10 DIAGNOSIS — I509 Heart failure, unspecified: Secondary | ICD-10-CM | POA: Diagnosis not present

## 2022-03-10 DIAGNOSIS — Z7901 Long term (current) use of anticoagulants: Secondary | ICD-10-CM | POA: Diagnosis not present

## 2022-03-10 DIAGNOSIS — I480 Paroxysmal atrial fibrillation: Secondary | ICD-10-CM | POA: Diagnosis not present

## 2022-03-10 DIAGNOSIS — Q231 Congenital insufficiency of aortic valve: Secondary | ICD-10-CM | POA: Diagnosis not present

## 2022-03-10 DIAGNOSIS — I428 Other cardiomyopathies: Secondary | ICD-10-CM | POA: Insufficient documentation

## 2022-03-10 DIAGNOSIS — Z79899 Other long term (current) drug therapy: Secondary | ICD-10-CM | POA: Insufficient documentation

## 2022-03-10 DIAGNOSIS — I4892 Unspecified atrial flutter: Secondary | ICD-10-CM | POA: Diagnosis not present

## 2022-03-10 DIAGNOSIS — I110 Hypertensive heart disease with heart failure: Secondary | ICD-10-CM | POA: Insufficient documentation

## 2022-03-10 DIAGNOSIS — D759 Disease of blood and blood-forming organs, unspecified: Secondary | ICD-10-CM | POA: Diagnosis not present

## 2022-03-10 DIAGNOSIS — D649 Anemia, unspecified: Secondary | ICD-10-CM | POA: Insufficient documentation

## 2022-03-10 DIAGNOSIS — I4891 Unspecified atrial fibrillation: Secondary | ICD-10-CM | POA: Diagnosis not present

## 2022-03-10 DIAGNOSIS — I5022 Chronic systolic (congestive) heart failure: Secondary | ICD-10-CM | POA: Diagnosis not present

## 2022-03-10 HISTORY — PX: TEE WITHOUT CARDIOVERSION: SHX5443

## 2022-03-10 HISTORY — PX: CARDIOVERSION: SHX1299

## 2022-03-10 LAB — PROTIME-INR
INR: 2.2 — ABNORMAL HIGH (ref 0.8–1.2)
Prothrombin Time: 24.5 seconds — ABNORMAL HIGH (ref 11.4–15.2)

## 2022-03-10 LAB — POCT I-STAT, CHEM 8
BUN: 14 mg/dL (ref 6–20)
Calcium, Ion: 1.24 mmol/L (ref 1.15–1.40)
Chloride: 105 mmol/L (ref 98–111)
Creatinine, Ser: 1.1 mg/dL (ref 0.61–1.24)
Glucose, Bld: 104 mg/dL — ABNORMAL HIGH (ref 70–99)
HCT: 33 % — ABNORMAL LOW (ref 39.0–52.0)
Hemoglobin: 11.2 g/dL — ABNORMAL LOW (ref 13.0–17.0)
Potassium: 4.4 mmol/L (ref 3.5–5.1)
Sodium: 140 mmol/L (ref 135–145)
TCO2: 25 mmol/L (ref 22–32)

## 2022-03-10 SURGERY — CARDIOVERSION
Anesthesia: Monitor Anesthesia Care

## 2022-03-10 MED ORDER — PHENYLEPHRINE HCL-NACL 20-0.9 MG/250ML-% IV SOLN
INTRAVENOUS | Status: DC | PRN
  Administered 2022-03-10: 40 ug/min via INTRAVENOUS

## 2022-03-10 MED ORDER — PHENYLEPHRINE 80 MCG/ML (10ML) SYRINGE FOR IV PUSH (FOR BLOOD PRESSURE SUPPORT)
PREFILLED_SYRINGE | INTRAVENOUS | Status: DC | PRN
  Administered 2022-03-10 (×3): 160 ug via INTRAVENOUS

## 2022-03-10 MED ORDER — EPHEDRINE SULFATE-NACL 50-0.9 MG/10ML-% IV SOSY
PREFILLED_SYRINGE | INTRAVENOUS | Status: DC | PRN
  Administered 2022-03-10 (×3): 5 mg via INTRAVENOUS

## 2022-03-10 MED ORDER — PROPOFOL 500 MG/50ML IV EMUL
INTRAVENOUS | Status: DC | PRN
  Administered 2022-03-10: 200 ug/kg/min via INTRAVENOUS

## 2022-03-10 MED ORDER — PROPOFOL 10 MG/ML IV BOLUS
INTRAVENOUS | Status: DC | PRN
  Administered 2022-03-10: 50 mg via INTRAVENOUS
  Administered 2022-03-10: 30 mg via INTRAVENOUS

## 2022-03-10 MED ORDER — GLYCOPYRROLATE PF 0.2 MG/ML IJ SOSY
PREFILLED_SYRINGE | INTRAMUSCULAR | Status: DC | PRN
  Administered 2022-03-10 (×2): .1 mg via INTRAVENOUS

## 2022-03-10 MED ORDER — SODIUM CHLORIDE 0.9 % IV SOLN: INTRAVENOUS | Status: DC

## 2022-03-10 NOTE — Interval H&P Note (Signed)
History and Physical Interval Note:  03/10/2022 9:36 AM  [name] Hinton  has presented today for surgery, with the diagnosis of AFIB.  The various methods of treatment have been discussed with the patient and family. After consideration of risks, benefits and other options for treatment, the patient has consented to  Procedure(s): CARDIOVERSION (N/A) TRANSESOPHAGEAL ECHOCARDIOGRAM (TEE) (N/A) as a surgical intervention.  The patient's history has been reviewed, patient examined, no change in status, stable for surgery.  I have reviewed the patient's chart and labs.  Questions were answered to the patient's satisfaction.     Claudia Alvizo [employer]

## 2022-03-10 NOTE — Discharge Instructions (Signed)
TEE  YOU HAD AN CARDIAC PROCEDURE TODAY: Refer to the procedure report and other information in the discharge instructions given to you for any specific questions about what was found during the examination. If this information does not answer your questions, please call [facility] office at [phone] to clarify.   DIET: Your first meal following the procedure should be a light meal and then it is ok to progress to your normal diet. A half-sandwich or bowl of soup is an example of a good first meal. Heavy or fried foods are harder to digest and may make you feel nauseous or bloated. Drink plenty of fluids but you should avoid alcoholic beverages for 24 hours. If you had a esophageal dilation, please see attached instructions for diet.   ACTIVITY: Your care partner should take you home directly after the procedure. You should plan to take it easy, moving slowly for the rest of the day. You can resume normal activity the day after the procedure however YOU SHOULD NOT DRIVE, use power tools, machinery or perform tasks that involve climbing or major physical exertion for 24 hours (because of the sedation medicines used during the test).   SYMPTOMS TO REPORT IMMEDIATELY: A cardiologist can be reached at any hour. Please call [phone] for any of the following symptoms:  Vomiting of blood or coffee ground material  New, significant abdominal pain  New, significant chest pain or pain under the shoulder blades  Painful or persistently difficult swallowing  New shortness of breath  Black, tarry-looking or red, bloody stools  FOLLOW UP:  Please also call with any specific questions about appointments or follow up tests.  Electrical Cardioversion  Electrical cardioversion is the delivery of a jolt of electricity to restore a normal rhythm to the heart. A rhythm that is too fast or is not regular keeps the heart from pumping well. In this procedure, sticky patches or metal paddles are placed on  the chest to deliver electricity to the heart from a device.  If this information does not answer your questions, please call [facility] office at [phone] to clarify.   Follow these instructions at home: You may have some redness on the skin where the shocks were given.  You may apply over-the-counter hydrocortisone cream or aloe vera to alleviate skin irritation. YOU SHOULD NOT DRIVE, use power tools, machinery or perform tasks that involve climbing or major physical exertion for 24 hours (because of the sedation medicines used during the test).  Take over-the-counter and prescription medicines only as told by your health care provider. Ask your health care provider how to check your pulse. Check it often. Rest for 48 hours after the procedure or as told by your health care provider. Avoid or limit your caffeine use as told by your health care provider. Keep all follow-up visits as told by your health care provider. This is important.  FOLLOW UP:  Please also call with any specific questions about appointments or follow up tests.   

## 2022-03-10 NOTE — CV Procedure (Signed)
Procedure: TEE  Sedation: Per anesthesiology  Indication: Atrial flutter  Findings: Please see echo section for full report. Normal left ventricular size and wall thickness.  EF 50-55%, no discrete wall motion abnormalities.  Normal RV size and systolic function.  Mild left atrial enlargement, the LA appendage has been ligated.  No LA thrombus.  Mild right atrial enlargement. Trivial TR, peak RV-RA gradient 11 mmHg.  Trivial mitral regurgitation.  Mechanical aortic valve with mean gradient 6 mmHg.  Mild perivalvular leakage.  AoV function appears normal. 4.3 cm aortic root.  Minimal plaque in thoracic aorta.    OK for DCCV.   [name] Hinton 03/10/2022 9:54 AM

## 2022-03-10 NOTE — Transfer of Care (Signed)
Immediate Anesthesia Transfer of Care Note  Patient: [name] Hinton  Procedure(s) Performed: CARDIOVERSION TRANSESOPHAGEAL ECHOCARDIOGRAM (TEE)  Patient Location: Short Stay  Anesthesia Type:MAC  Level of Consciousness: awake  Airway & Oxygen Therapy: Patient Spontanous Breathing  Post-op Assessment: Report given to RN and Post -op Vital signs reviewed and stable  Post vital signs: Reviewed and stable  Last Vitals:  Vitals Value Taken Time  BP 108/78   Temp    Pulse 52   Resp 12   SpO2 100     Last Pain:  Vitals:   03/10/22 0720  TempSrc: Temporal  PainSc: 0-No pain         Complications: No notable events documented.

## 2022-03-10 NOTE — Anesthesia Procedure Notes (Signed)
Procedure Name: General with mask airway Date/Time: 03/10/2022 9:24 AM  Performed by: Erick Colace, CRNAPre-anesthesia Checklist: Patient identified, Emergency Drugs available, Suction available and Patient being monitored Patient Re-evaluated:Patient Re-evaluated prior to induction Oxygen Delivery Method: Nasal cannula Preoxygenation: Pre-oxygenation with 100% oxygen Induction Type: IV induction Airway Equipment and Method: Bite block Dental Injury: Teeth and Oropharynx as per pre-operative assessment

## 2022-03-10 NOTE — Progress Notes (Signed)
  Echocardiogram Echocardiogram Transesophageal has been performed.  Bobbye Charleston 03/10/2022, 10:01 AM

## 2022-03-10 NOTE — Procedures (Signed)
Electrical Cardioversion Procedure Note [name] Hinton [id number] Jul 11, 1971  Procedure: Electrical Cardioversion Indications:  Atrial Flutter  Procedure Details Consent: Risks of procedure as well as the alternatives and risks of each were explained to the (patient/caregiver).  Consent for procedure obtained. Time Out: Verified patient identification, verified procedure, site/side was marked, verified correct patient position, special equipment/implants available, medications/allergies/relevent history reviewed, required imaging and test results available.  Performed  Patient placed on cardiac monitor, pulse oximetry, supplemental oxygen as necessary.  Sedation given:  Propofol per anesthesiology Pacer pads placed anterior and posterior chest.  Cardioverted 1 time(s).  Cardioverted at 150J.  Evaluation Findings: Post procedure EKG shows: NSR Complications: None Patient did tolerate procedure well.   [name] Hinton 03/10/2022, 9:55 AM

## 2022-03-12 ENCOUNTER — Ambulatory Visit (INDEPENDENT_AMBULATORY_CARE_PROVIDER_SITE_OTHER): Payer: BC Managed Care – PPO

## 2022-03-12 ENCOUNTER — Encounter (HOSPITAL_COMMUNITY): Payer: Self-pay | Admitting: Cardiology

## 2022-03-12 DIAGNOSIS — Z954 Presence of other heart-valve replacement: Secondary | ICD-10-CM

## 2022-03-12 DIAGNOSIS — I359 Nonrheumatic aortic valve disorder, unspecified: Secondary | ICD-10-CM

## 2022-03-12 DIAGNOSIS — Z9889 Other specified postprocedural states: Secondary | ICD-10-CM

## 2022-03-12 DIAGNOSIS — I351 Nonrheumatic aortic (valve) insufficiency: Secondary | ICD-10-CM | POA: Diagnosis not present

## 2022-03-12 DIAGNOSIS — Z5181 Encounter for therapeutic drug level monitoring: Secondary | ICD-10-CM | POA: Diagnosis not present

## 2022-03-12 DIAGNOSIS — I480 Paroxysmal atrial fibrillation: Secondary | ICD-10-CM | POA: Diagnosis not present

## 2022-03-12 DIAGNOSIS — Z8679 Personal history of other diseases of the circulatory system: Secondary | ICD-10-CM

## 2022-03-12 LAB — POCT INR: INR: 2.7 (ref 2.0–3.0)

## 2022-03-12 NOTE — Patient Instructions (Addendum)
Description   Continue taking Warfarin 1 tablet daily except for 1.5 tablets on Mondays, Wednesdays, and Fridays.  Stay consistent with greens (3-4 times per week)  Recheck INR in 1 week.  Coumadin Clinic [phone], call with any changes in medication or up coming procedure.

## 2022-03-13 NOTE — Anesthesia Postprocedure Evaluation (Signed)
Anesthesia Post Note  Patient: [name] Hinton  Procedure(s) Performed: CARDIOVERSION TRANSESOPHAGEAL ECHOCARDIOGRAM (TEE)     Patient location during evaluation: Endoscopy Anesthesia Type: MAC Level of consciousness: awake and patient cooperative Pain management: pain level controlled Vital Signs Assessment: post-procedure vital signs reviewed and stable Respiratory status: spontaneous breathing, nonlabored ventilation, respiratory function stable and patient connected to nasal cannula oxygen Cardiovascular status: blood pressure returned to baseline and stable Postop Assessment: no apparent nausea or vomiting Anesthetic complications: no   No notable events documented.  Last Vitals:  Vitals:   03/10/22 1025 03/10/22 1030  BP: 101/67 98/68  Pulse: (!) 54 (!) 55  Resp: 15 20  Temp:    SpO2: 100% 100%    Last Pain:  Vitals:   03/10/22 1030  TempSrc:   PainSc: 0-No pain                 Jolena Kittle

## 2022-03-15 ENCOUNTER — Other Ambulatory Visit: Payer: Self-pay | Admitting: Cardiology

## 2022-03-15 DIAGNOSIS — Q231 Congenital insufficiency of aortic valve: Secondary | ICD-10-CM

## 2022-03-15 DIAGNOSIS — I480 Paroxysmal atrial fibrillation: Secondary | ICD-10-CM

## 2022-03-17 NOTE — Telephone Encounter (Signed)
Refill request for warfarin:  Last INR was 2.7 on 03/12/22 Next INR due on 03/20/22 LOV was 03/04/22  Einar Crow MD  Refill approved

## 2022-03-20 ENCOUNTER — Ambulatory Visit: Payer: BC Managed Care – PPO | Attending: Cardiology

## 2022-03-20 DIAGNOSIS — Z5181 Encounter for therapeutic drug level monitoring: Secondary | ICD-10-CM | POA: Diagnosis not present

## 2022-03-20 DIAGNOSIS — I480 Paroxysmal atrial fibrillation: Secondary | ICD-10-CM

## 2022-03-20 DIAGNOSIS — I359 Nonrheumatic aortic valve disorder, unspecified: Secondary | ICD-10-CM

## 2022-03-20 DIAGNOSIS — Z9889 Other specified postprocedural states: Secondary | ICD-10-CM

## 2022-03-20 DIAGNOSIS — I351 Nonrheumatic aortic (valve) insufficiency: Secondary | ICD-10-CM | POA: Diagnosis not present

## 2022-03-20 DIAGNOSIS — Z8679 Personal history of other diseases of the circulatory system: Secondary | ICD-10-CM

## 2022-03-20 DIAGNOSIS — Z954 Presence of other heart-valve replacement: Secondary | ICD-10-CM

## 2022-03-20 LAB — POCT INR: INR: 5.2 — AB (ref 2.0–3.0)

## 2022-03-20 NOTE — Patient Instructions (Signed)
Description   Hold tomorrow's dose and Saturday's dose and then START taking Warfarin 1 tablet daily.  Stay consistent with greens (3-4 times per week)  Recheck INR in 1 week.  Coumadin Clinic [phone], call with any changes in medication or up coming procedure.  *Amio '200mg'$  BID*

## 2022-03-26 ENCOUNTER — Ambulatory Visit: Payer: BC Managed Care – PPO | Attending: Cardiology | Admitting: *Deleted

## 2022-03-26 DIAGNOSIS — Z9889 Other specified postprocedural states: Secondary | ICD-10-CM

## 2022-03-26 DIAGNOSIS — Z954 Presence of other heart-valve replacement: Secondary | ICD-10-CM

## 2022-03-26 DIAGNOSIS — Z8679 Personal history of other diseases of the circulatory system: Secondary | ICD-10-CM

## 2022-03-26 DIAGNOSIS — I351 Nonrheumatic aortic (valve) insufficiency: Secondary | ICD-10-CM | POA: Diagnosis not present

## 2022-03-26 DIAGNOSIS — I480 Paroxysmal atrial fibrillation: Secondary | ICD-10-CM

## 2022-03-26 DIAGNOSIS — I359 Nonrheumatic aortic valve disorder, unspecified: Secondary | ICD-10-CM | POA: Diagnosis not present

## 2022-03-26 DIAGNOSIS — Z5181 Encounter for therapeutic drug level monitoring: Secondary | ICD-10-CM | POA: Diagnosis not present

## 2022-03-26 LAB — POCT INR: INR: 3.1 — AB (ref 2.0–3.0)

## 2022-03-26 NOTE — Patient Instructions (Signed)
Description   Continue taking Warfarin 1 tablet daily. Stay consistent with greens (3-4 times per week)  Recheck INR in 12 days.  Coumadin Clinic [phone] or [phone], call with any changes in medication or up coming procedure.  *Amio '200mg'$  daily*

## 2022-03-31 ENCOUNTER — Ambulatory Visit (HOSPITAL_COMMUNITY)
Admission: RE | Admit: 2022-03-31 | Discharge: 2022-03-31 | Disposition: A | Discharge status: Home / Self Care | Payer: BC Managed Care – PPO | Source: Ambulatory Visit | Attending: Family Medicine | Admitting: Family Medicine

## 2022-03-31 ENCOUNTER — Encounter (HOSPITAL_COMMUNITY): Payer: Self-pay

## 2022-03-31 VITALS — BP 144/98 | HR 67 | Wt 185.6 lb

## 2022-03-31 DIAGNOSIS — Z954 Presence of other heart-valve replacement: Secondary | ICD-10-CM | POA: Diagnosis not present

## 2022-03-31 DIAGNOSIS — Z79899 Other long term (current) drug therapy: Secondary | ICD-10-CM | POA: Insufficient documentation

## 2022-03-31 DIAGNOSIS — E785 Hyperlipidemia, unspecified: Secondary | ICD-10-CM

## 2022-03-31 DIAGNOSIS — I110 Hypertensive heart disease with heart failure: Secondary | ICD-10-CM | POA: Diagnosis not present

## 2022-03-31 DIAGNOSIS — F1010 Alcohol abuse, uncomplicated: Secondary | ICD-10-CM | POA: Insufficient documentation

## 2022-03-31 DIAGNOSIS — F1011 Alcohol abuse, in remission: Secondary | ICD-10-CM | POA: Diagnosis not present

## 2022-03-31 DIAGNOSIS — Z952 Presence of prosthetic heart valve: Secondary | ICD-10-CM | POA: Insufficient documentation

## 2022-03-31 DIAGNOSIS — R29818 Other symptoms and signs involving the nervous system: Secondary | ICD-10-CM

## 2022-03-31 DIAGNOSIS — I10 Essential (primary) hypertension: Secondary | ICD-10-CM

## 2022-03-31 DIAGNOSIS — I4892 Unspecified atrial flutter: Secondary | ICD-10-CM | POA: Insufficient documentation

## 2022-03-31 DIAGNOSIS — I428 Other cardiomyopathies: Secondary | ICD-10-CM | POA: Diagnosis not present

## 2022-03-31 DIAGNOSIS — Q231 Congenital insufficiency of aortic valve: Secondary | ICD-10-CM | POA: Diagnosis not present

## 2022-03-31 DIAGNOSIS — Z87891 Personal history of nicotine dependence: Secondary | ICD-10-CM | POA: Diagnosis not present

## 2022-03-31 DIAGNOSIS — R0683 Snoring: Secondary | ICD-10-CM | POA: Diagnosis not present

## 2022-03-31 DIAGNOSIS — I480 Paroxysmal atrial fibrillation: Secondary | ICD-10-CM

## 2022-03-31 DIAGNOSIS — I5022 Chronic systolic (congestive) heart failure: Secondary | ICD-10-CM

## 2022-03-31 DIAGNOSIS — Z7901 Long term (current) use of anticoagulants: Secondary | ICD-10-CM | POA: Diagnosis not present

## 2022-03-31 DIAGNOSIS — Z7982 Long term (current) use of aspirin: Secondary | ICD-10-CM | POA: Diagnosis not present

## 2022-03-31 DIAGNOSIS — R634 Abnormal weight loss: Secondary | ICD-10-CM | POA: Diagnosis not present

## 2022-03-31 LAB — COMPREHENSIVE METABOLIC PANEL
ALT: 22 U/L (ref 0–44)
AST: 23 U/L (ref 15–41)
Albumin: 4.2 g/dL (ref 3.5–5.0)
Alkaline Phosphatase: 42 U/L (ref 38–126)
Anion gap: 11 (ref 5–15)
BUN: 16 mg/dL (ref 6–20)
CO2: 22 mmol/L (ref 22–32)
Calcium: 9.3 mg/dL (ref 8.9–10.3)
Chloride: 106 mmol/L (ref 98–111)
Creatinine, Ser: 1.04 mg/dL (ref 0.61–1.24)
GFR, Estimated: >60 mL/min (ref >60)
Glucose, Bld: 88 mg/dL (ref 70–99)
Potassium: 4.6 mmol/L (ref 3.5–5.1)
Sodium: 139 mmol/L (ref 135–145)
Total Bilirubin: 0.9 mg/dL (ref 0.3–1.2)
Total Protein: 6.5 g/dL (ref 6.5–8.1)

## 2022-03-31 LAB — TSH: TSH: 2.169 u[IU]/mL (ref 0.350–4.500)

## 2022-03-31 NOTE — Progress Notes (Signed)
Patient ID: [name] Hinton, male   DOB: [birth year], 51 y.o.   MRN: [id number] PCP: Dr. Ashby Dawes Cardiology: Dr. Aundra Dubin  [name] is a 51 y.o.with a prior history of alcohol abuse and former smoker (quit  2011). Hospitalized 1/15 with increased dyspnea-->ECHO performed and showed EF 25%, global hypokinesis, RV mildly dilated,severe aortic insufficiency.  Patient had new-onset acute systolic CHF and atrial flutter. Started on cardizem drip and Xarelto. Initially in A flutter--> became A fib.   He had TEE and DC-CV on 08/01/13.  Dr Roxy Manns provided consultation due to ascending aortic aneurysm and bicuspid valve with severe AI. He was discharged on ACEI, beta blocker, spironolactone, and xarelto. Discharge weight 150 pounds.    Had cough with lisinopril and switched to losartan.  LHC/RHC was done 2/15 showing no coronary disease and elevated PCWP with pulmonary hypertension.   In 3/15, he had replacement of ascending aortic and aortic valve with Bentall procedure with mechanical aortic valve.  He also had Maze procedure and LAA obliteration.  He was noted to have gone into atypical atrial flutter by the time of his 3/30 followup with Dr. Roxy Manns.  He was started on amiodarone and went back into NSR. He is back at work.  Echo in 4/15 showed normally functioning mechanical aortic valve, but EF remained 30-35%.   Echo in 4/15 showed normally functioning mechanical aortic valve, but EF remained 30-35%.  ECHO 01/2014  EF up to 45% with normal mechanical aortic valve.  Echo 9/17 with increase in EF to 55-60% with mean gradient 13 mmHg across mechanical AoV.    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  No other neurological symptoms.   In 3/18, he was admitted with lower GI bleeding/symptomatic anemia.  He received 2 units PRBCs.  Suspected to be hemorrhoidal.  He had a colonoscopy confirming hemorrhoids and showing 1 polyp that was removed.    Echo in 11/19 showed EF 55% with mild LVH,  normal RV size with mildly decreased systolic function, normal mechanical aortic valve. Echo in 10/21 showed EF 50-55%, normal RV, moderate biatrial enlargement, mechanical aortic valve appeared to function normally.   Follow up 12/22, stable NYHA I symptoms and no longer drinking ETOH. Remained in NSR.   Back in AFL RVR 4/23. Underwent TEE and DCCV 11/15/21 --> NSR. EF 50-55%, normal RV, no LAA, AV ok, mean gradient 7 mmHg.  Patient had recurrent atypical atrial flutter with DCCV to NSR in 7/23.  Unfortunately back in AFL ~ 2 weeks later.   S/p TEE guided DCCV 03/10/22 to NSR. TEE showed EF 50-55%, normal RV, no LA thrombus, mechanical Ao valve mean gradient 6 mmHg  Today he returns for post cardioversion HF follow up. Overall feeling much better. He is not short of breath at work or with ADLs. Denies palpitations, abnormal bleeding, CP, dizziness, edema, or PND/Orthopnea. Appetite ok. No fever or chills. Weight at home 184 pounds. Taking all medications. No longer drinking ETOH, he snores but no daytime sleepiness. He works full time at [employer]. Has not needed to take Lasix.  ECG (personally reviewed): NSR 65 bpm  Labs (1/15): K 5.2, creatinine 1.3, AST 40, ALT 58 Labs (2/15): K 4.7, creatinine 1.1 Labs (4/15): K 4.9, creatinine 1.0, LFTs normal, TSH normal Labs (5/15): K 4.6, creatinine 1.1, BNP 134 Labs (6/15): K 4.8  creatinine 1.0. INR 2.4 Labs (03/01/14): K 4.7 Creatinine 1.16  Labs (11/15): K 4.8, creatinine 0.79 Labs (3/18): K 4.4, creatinine  0.8, hgb 10.2 Labs (7/18): K 4.2, creatinine 0.86, hgb 9.8 Labs (7/21): LDL 146, K 4.4, creatinine 0.92, hgb 13.5 Labs (12/22): LDL 116, HDL 82 Labs (4/23): K 4.0, creatinine 0.89, hgb 13.4 Labs (5/23): K 4.8, creatinine 0.88 Labs (7/23): K 4.2, creatinine 0.89, BNP 399  PMH:  1. A fib/Aflutter: Initially flutter during 1/15 admission, degenerated to fibrillation --> S/P TEE DC-CV to NSR. Atypical atrial flutter recurred post-op  in 3/15. 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  - Atypical flutter s/p TEE with DCCV 4/23 --> NSR. - Atypical flutter s/p DCCV 7/23 to NSR - Atypical flutter s/p TEE with DCCV (8/23)-->NSR. 2. 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  TEE (1/15) with EF 25%, moderate LV dilation, bicuspid aortic valve with severe eccentric AI and no AS, ascending aorta 5.0 cm, moderately decreased RV systolic function.  CTA chest (1/15) with 5.1 cm ascending aortic aneurysm. Patient had Bentall replacement of ascending aorta with mechanical aortic valve and Maze with LAA obliteration in 3/15.  - TEE (4/23): mean gradient 7 mmHg. 3. Cardiomyopathy with systolic CHF: As above, TEE with EF 25% and moderately dilated LV, moderately decreased RV systolic function.  Tachy-mediated CMP versus ETOH CMP versus long-standing AI versus a combination of the three.  LHC/RHC (2/15): no coronary disease, mean RA 5, PA 62/25, mean PCWP 22, CI 1.8, PVR 5.2 WU, LVEDP 35 (severe AI).  Echo (2/15): EF 35-40%, moderately dilated LV, bicuspid aortic valve with severe AI.  Echo (4/15) with mildly dilated LV, mild LVH, EF 30-35%, normal RV size and systolic function, mechanical aortic valve appeared to function normally, IVC small.  Echo (7/15) with EF 45%, normal mechanical aortic valve, normal RV size with mildly decreased systolic function.  - Echo (9/17) with EF 55-60%, mechanical aortic valve with mean gradient 13 mmHg.  - Echo (11/19) with EF 55% with mild LVH, normal RV size with mildly decreased systolic function, normal mechanical aortic valve.  - Echo (10/21): EF 50-55%, normal RV, moderate biatrial enlargement, mechanical aortic valve appeared to function normally. - TEE (4/23): EF 50-55%, no regional WMAs, normal RV, no LA thrombus, no PFO/ASD, mechanical valve without significant regurgitation, mean gradient 7 mmHg. - TEE (8/23): EF 50-55%, normal RV, no LA thrombus,  mechanical Ao valve mean gradient 6 mmgHg 4. Former smoker quit 2011. 5. Alcohol abuse but has cut back considerably.  6. ACEI cough. 7. Palpitations: Holter monitor (12/15) with occasional PVCs and PACs.  8. Hyperlipidemia  FH: Father- Heart Failure, Grandfather CAD CABG   SH: Quit smoking 2011. Heavy ETOH until 1/15.  Engaged.  Works as Freight forwarder at [employer].   ROS: All systems negative except as listed in HPI, PMH and Problem List.  Current Outpatient Medications  Medication Sig Dispense Refill   amiodarone (PACERONE) 200 MG tablet Take 200 mg by mouth daily.     aspirin EC 81 MG tablet Take 1 tablet (81 mg total) by mouth daily. 90 tablet 3   carvedilol (COREG) 12.5 MG tablet TAKE 1 TABLET(12.5 MG) BY MOUTH TWICE DAILY WITH A MEAL 60 tablet 11   Ferrous Sulfate (IRON PO) Take 130 mg by mouth daily.     furosemide (LASIX) 20 MG tablet Take 1 tablet (20 mg total) by mouth as needed. 45 tablet 1   potassium chloride SA (KLOR-CON M) 20 MEQ tablet Take 1 tablet (20 mEq total) by mouth as needed. 20 tablet 4   spironolactone (ALDACTONE) 25 MG tablet  TAKE 1 TABLET(25 MG) BY MOUTH DAILY 90 tablet 2   valsartan (DIOVAN) 80 MG tablet Take 1 tablet (80 mg total) by mouth 2 (two) times daily. 180 tablet 3   warfarin (COUMADIN) 5 MG tablet TAKE 1-1 1/2 TABLETS DAILY AS DIRECTED 40 tablet 3   No current facility-administered medications for this encounter.   BP (!) 144/98   Pulse 67   Wt 84.2 kg (185 lb 9.6 oz)   SpO2 98%   BMI 28.22 kg/m   Wt Readings from Last 3 Encounters:  03/31/22 84.2 kg (185 lb 9.6 oz)  03/10/22 81 kg (178 lb 9.2 oz)  03/04/22 81 kg (178 lb 9.6 oz)   PHYSICAL EXAM: General:  NAD. No resp difficulty HEENT: Normal Neck: Supple. No JVD. Carotids 2+ bilat; no bruits. No lymphadenopathy or thryomegaly appreciated. Cor: PMI nondisplaced. Regular rate & rhythm. No rubs, gallops, + mechanical S2 Lungs: Clear Abdomen: Soft, nontender, nondistended. No  hepatosplenomegaly. No bruits or masses. Good bowel sounds. Extremities: No cyanosis, clubbing, rash, edema Neuro: Alert & oriented x 3, cranial nerves grossly intact. Moves all 4 extremities w/o difficulty. Affect pleasant.  ASSESSMENT & PLAN: 1. Paroxysmal atrial fibrillation/flutter: s/p TEE/DCCV 08/01/13. Had Maze/LAA obliteration with Bentall.  Post-op atypical flutter.  No longer on amiodarone.  He had DCCV in 4/23, 7/23, and again 8/23 with atypical flutter. He does not do well in atrial flutter.  He has quit drinking ETOH and has started losing weight.  He does report snoring.  - He has not held sinus w/o antiarrhythmic and is now on amiodarone 200 mg daily. He will need regular eye exams. Check TSH and LFTs today. - He has appointment with EP to discuss atrial flutter ablation, this would hopefully allow him to stop amiodarone. - He should remain off ETOH (prior heavy ETOH), work on weight loss, and will need sleep study to assess for OSA.  2. Chronic systolic CHF: Nonischemic cardiomyopathy.  Tachy-mediated CMP versus ETOH CMP versus long-standing aortic insufficiency or (most likely) a combination of all 3 possible causes.  He is no longer drinking and has had mechanical AVR.  Echo in 10/21 showed EF up to low normal, 50-55%. TEE (4/23) EF 50-55%, normal RV. TEE (8/23) showed EF  50-55%, normal RV. NYHA class II, not volume overloaded on exam.  - Continue Coreg 12.5 mg bid.  - Continue valsartan 80 mg bid. Intolerant ace due to cough.  - Continue spironolactone 25 mg daily.  BMET today 3. Bicuspid aortic valve disorder with severe AI and severely dilated ascending aorta (5.1 cm on CTA).  Status post mechanical AVR + ascending aorta replacement.  The valve looked good on 7/15 echo and TEE 4/23 and 8/23. Episode of possible amaurosis fugax in the past.  He is now on ASA 81 mg daily (which will be continued) and on warfarin with goal INR 2.5-3.5. INR managed [facility].  - Reinforced need  for SBE prophylaxis. 4. ETOH abuse: Completely stopped since last DCCV. Congratulated. 5. HTN: Stable. Continue current regimen. 6. Hyperlipidemia: Lipids ok 12/22.  7. Suspect OSA: Arrange for home sleep study. Awaiting on pre-cert.  Follow up 3-4 months with Dr. Aundra Dubin.  Maricela Bo [facility] FNP-BC 03/31/2022

## 2022-03-31 NOTE — Patient Instructions (Signed)
Thank you for coming in today  Labs were done today, if any labs are abnormal the clinic will call you No news is good news  Your physician recommends that you schedule a follow-up appointment in:  3-4 months with Dr. Darlis Loan approval is still needed for your at home sleep study. Once approved you will receive a call to do at home sleep study    Do the following things EVERYDAY: Weigh yourself in the morning before breakfast. Write it down and keep it in a log. Take your medicines as prescribed Eat low salt foods--Limit salt (sodium) to 2000 mg per day.  Stay as active as you can everyday Limit all fluids for the day to less than 2 liters  At the [facility], you and your health needs are our priority. As part of our continuing mission to provide you with exceptional heart care, we have created designated Provider Care Teams. These Care Teams include your primary Cardiologist (physician) and Advanced Practice Providers (APPs- Physician Assistants and Nurse Practitioners) who all work together to provide you with the care you need, when you need it.   You may see any of the following providers on your designated Care Team at your next follow up: Dr Glori Bickers Dr [name] Dr. Roxana Hires, NP Lyda Jester, [place] [facility] [place], [place] Forestine Na, NP Audry Riles, PharmD   Please be sure to bring in all your medications bottles to every appointment.   If you have any questions or concerns before your next appointment please send [place] a message through [place] or call our office at [phone].    TO LEAVE A MESSAGE FOR THE NURSE SELECT OPTION 2, PLEASE LEAVE A MESSAGE INCLUDING: YOUR NAME DATE OF BIRTH CALL BACK NUMBER REASON FOR CALL**this is important as we prioritize the call backs  YOU WILL RECEIVE A CALL BACK THE SAME DAY AS LONG AS YOU CALL BEFORE 4:00 PM

## 2022-04-01 ENCOUNTER — Ambulatory Visit: Payer: BC Managed Care – PPO | Attending: Cardiology | Admitting: Cardiology

## 2022-04-01 ENCOUNTER — Telehealth: Payer: Self-pay | Admitting: *Deleted

## 2022-04-01 ENCOUNTER — Encounter: Payer: Self-pay | Admitting: Cardiology

## 2022-04-01 ENCOUNTER — Encounter: Payer: Self-pay | Admitting: *Deleted

## 2022-04-01 VITALS — BP 124/88 | HR 61 | Ht 68.0 in | Wt 183.2 lb

## 2022-04-01 DIAGNOSIS — D6869 Other thrombophilia: Secondary | ICD-10-CM | POA: Diagnosis not present

## 2022-04-01 DIAGNOSIS — I484 Atypical atrial flutter: Secondary | ICD-10-CM | POA: Diagnosis not present

## 2022-04-01 DIAGNOSIS — I4819 Other persistent atrial fibrillation: Secondary | ICD-10-CM

## 2022-04-01 LAB — ECHO TEE
AR max vel: 4.34 cm2
AV Area VTI: 3.91 cm2
AV Area mean vel: 3.84 cm2
AV Mean grad: 5 mmHg
AV Peak grad: 8.9 mmHg
Ao pk vel: 1.49 m/s

## 2022-04-01 NOTE — Telephone Encounter (Signed)
Pt having Afib Ablation 08/13/22 and will need weekly checks. Placed note on upcoming appt so it's placed on all appts leading up to that point.

## 2022-04-01 NOTE — Patient Instructions (Addendum)
Medication Instructions:  Your physician recommends that you continue on your current medications as directed. Please refer to the Current Medication list given to you today.  *If you need a refill on your cardiac medications before your next appointment, please call your pharmacy*   Lab Work: Pre procedure labs -- see procedure instruction letter:  BMP & CBC  If you have labs (blood work) drawn today and your tests are completely normal, you will receive your results only by: [place] (if you have MyChart) OR A paper copy in the mail If you have any lab test that is abnormal or we need to change your treatment, we will call you to review the results.   Testing/Procedures: Your physician has requested that you have cardiac CT within 7 days PRIOR to your ablation. Cardiac computed tomography (CT) is a painless test that uses an x-ray machine to take clear, detailed pictures of your heart.  Please follow instruction below located under "other instructions". You will get a call from our office to schedule the date for this test.  Your physician has recommended that you have an ablation. Catheter ablation is a medical procedure used to treat some cardiac arrhythmias (irregular heartbeats). During catheter ablation, a long, thin, flexible tube is put into a blood vessel in your groin (upper thigh), or neck. This tube is called an ablation catheter. It is then guided to your heart through the blood vessel. Radio frequency waves destroy small areas of heart tissue where abnormal heartbeats may cause an arrhythmia to start. Please follow instruction letter given to you today.   Follow-Up: At [facility], you and your health needs are our priority.  As part of our continuing mission to provide you with exceptional heart care, we have created designated Provider Care Teams.  These Care Teams include your primary Cardiologist (physician) and Advanced Practice Providers (APPs -  Physician  Assistants and Nurse Practitioners) who all work together to provide you with the care you need, when you need it.  Your physician recommends that you schedule a follow-up appointment between 07/22/22 - 08/01/22 in the AFib clinic  Your next appointment:   1 month(s) after your ablation  The format for your next appointment:   In Person  Provider:   AFib clinic   Thank you for choosing [facility]!!   Trinidad Curet, RN [phone]    Other Instructions   Cardiac Ablation Cardiac ablation is a procedure to destroy (ablate) some heart tissue that is sending bad signals. These bad signals cause problems in heart rhythm. The heart has many areas that make these signals. If there are problems in these areas, they can make the heart beat in a way that is not normal. Destroying some tissues can help make the heart rhythm normal. Tell your doctor about: Any allergies you have. All medicines you are taking. These include vitamins, herbs, eye drops, creams, and over-the-counter medicines. Any problems you or family members have had with medicines that make you fall asleep (anesthetics). Any blood disorders you have. Any surgeries you have had. Any medical conditions you have, such as kidney failure. Whether you are pregnant or may be pregnant. What are the risks? This is a safe procedure. But problems may occur, including: Infection. Bruising and bleeding. Bleeding into the chest. Stroke or blood clots. Damage to nearby areas of your body. Allergies to medicines or dyes. The need for a pacemaker if the normal system is damaged. Failure of the procedure to treat the  problem. What happens before the procedure? Medicines Ask your doctor about: Changing or stopping your normal medicines. This is important. Taking aspirin and ibuprofen. Do not take these medicines unless your doctor tells you to take them. Taking other medicines, vitamins, herbs, and supplements. General  instructions Follow instructions from your doctor about what you cannot eat or drink. Plan to have someone take you home from the hospital or clinic. If you will be going home right after the procedure, plan to have someone with you for 24 hours. Ask your doctor what steps will be taken to prevent infection. What happens during the procedure?  An IV tube will be put into one of your veins. You will be given a medicine to help you relax. The skin on your neck or groin will be numbed. A cut (incision) will be made in your neck or groin. A needle will be put through your cut and into a large vein. A tube (catheter) will be put into the needle. The tube will be moved to your heart. Dye may be put through the tube. This helps your doctor see your heart. Small devices (electrodes) on the tube will send out signals. A type of energy will be used to destroy some heart tissue. The tube will be taken out. Pressure will be held on your cut. This helps stop bleeding. A bandage will be put over your cut. The exact procedure may vary among doctors and hospitals. What happens after the procedure? You will be watched until you leave the hospital or clinic. This includes checking your heart rate, breathing rate, oxygen, and blood pressure. Your cut will be watched for bleeding. You will need to lie still for a few hours. Do not drive for 24 hours or as long as your doctor tells you. Summary Cardiac ablation is a procedure to destroy some heart tissue. This is done to treat heart rhythm problems. Tell your doctor about any medical conditions you may have. Tell him or her about all medicines you are taking to treat them. This is a safe procedure. But problems may occur. These include infection, bruising, bleeding, and damage to nearby areas of your body. Follow what your doctor tells you about food and drink. You may also be told to change or stop some of your medicines. After the procedure, do not drive  for 24 hours or as long as your doctor tells you. This information is not intended to replace advice given to you by your health care provider. Make sure you discuss any questions you have with your health care provider. Document Revised: 09/27/2021 Document Reviewed: 06/09/2019 Elsevier Patient Education  [place].

## 2022-04-01 NOTE — Progress Notes (Signed)
Electrophysiology Office Note   Date:  04/01/2022   ID:  [name] Hinton, DOB 05/11/1971, MRN [id number]  PCP:  Merrilee Seashore, MD  Cardiologist:  Aundra Dubin Primary Electrophysiologist:  Manfred Laspina Meredith Leeds, MD    Chief Complaint: atrial flutter   History of Present Illness: [name] Hinton is a 51 y.o. male who is being seen today for the evaluation of atrial flutter at the request of [place], Maricela Bo, [place]. Presenting today for electrophysiology evaluation.  He has a history significant for alcohol abuse, tobacco abuse.  He was hospitalized 2015 with heart failure and severe aortic insufficiency.  Found to have atrial flutter.  He was found to have a bicuspid aortic valve and ascending aortic aneurysm.  He is status post Bentall procedure with ascending aortic aneurysm and arch replacement with aortic valve replacement March 2015.  He had a maze at the time with left atrial appendage occlusion.  He has had episodes of atypical atrial flutter.  He was initially on amiodarone.  In 2018 he was admitted for lower GI bleed.  This suspected due to hemorrhoids.  2019 his ejection fraction improved to normal.  July 2023, he went back into atrial flutter.  He had a TEE and cardioversion for atypical atrial flutter.  Amiodarone started 4 weeks ago.  Since then he is remained in normal rhythm.  He feels much improved in sinus rhythm.   Today, he denies symptoms of palpitations, chest pain, shortness of breath, orthopnea, PND, lower extremity edema, claudication, dizziness, presyncope, syncope, bleeding, or neurologic sequela. The patient is tolerating medications without difficulties.    Past Medical History:  Diagnosis Date   Dysrhythmia    ETOH abuse    Heart murmur    Hx of repair of ascending aorta 09/27/2013   Non-ischemic cardiomyopathy (HCC)    PAF (paroxysmal atrial fibrillation) ([place])    a. Dx 07/2013, s/p TEE/DCCV.   Paroxysmal atrial flutter ([place])    a. Dx 07/2013.   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 Bicuspid aortic valve with severe AI, dilated ascending aorta dx 07/2013.   Systolic CHF ([place])    a. Dx 07/2013: EF 25%, felt likely related to combo of EtOH + tachy-mediated + AI. LHC pending.   Past Surgical History:  Procedure Laterality Date   ASCENDING AORTIC ROOT REPLACEMENT N/A 09/27/2013   Procedure: ASCENDING AORTIC ROOT REPLACEMENT;  Surgeon: [name], MD;  Location: [place];  Service: Open Heart Surgery;  Laterality: N/A;   BENTALL PROCEDURE N/A 09/27/2013   Procedure: BENTALL PROCEDURE;  Surgeon: [name], MD;  Location: [place];  Service: Open Heart Surgery;  Laterality: N/A;   CARDIAC CATHETERIZATION     CARDIOVERSION N/A 08/01/2013   Procedure: CARDIOVERSION;  Surgeon: [name], MD;  Location: [place];  Service: Cardiovascular;  Laterality: N/A;   CARDIOVERSION N/A 11/15/2021   Procedure: CARDIOVERSION;  Surgeon: [name], MD;  Location: [facility] ENDOSCOPY;  Service: Cardiovascular;  Laterality: N/A;   CARDIOVERSION N/A 02/14/2022   Procedure: CARDIOVERSION;  Surgeon: [name], MD;  Location: [facility] ENDOSCOPY;  Service: Cardiovascular;  Laterality: N/A;   CARDIOVERSION N/A 03/10/2022   Procedure: CARDIOVERSION;  Surgeon: [name], MD;  Location: [facility] ENDOSCOPY;  Service: Cardiovascular;  Laterality: N/A;   COLONOSCOPY WITH PROPOFOL Left 10/10/2016   Procedure: COLONOSCOPY WITH PROPOFOL;  Surgeon: Arta Silence, MD;  Location[facility]COPY;  Service: Endoscopy;  Laterality: Left;   INTRAOPERATIVE TRANSESOPHAGEAL ECHOCARDIOGRAM N/A 09/27/2013   Procedure: INTRAOPERATIVE  TRANSESOPHAGEAL ECHOCARDIOGRAM;  Surgeon: [name], MD;  Location: [place];  Service: Open Heart Surgery;  Laterality: N/A;   LEFT AND RIGHT HEART CATHETERIZATION WITH CORONARY ANGIOGRAM N/A 09/05/2013   Procedure: LEFT AND RIGHT HEART CATHETERIZATION WITH CORONARY ANGIOGRAM;  Surgeon: [name], MD;  Location: [facility] CATH LAB;  Service: Cardiovascular;  Laterality: N/A;   MAZE N/A 09/27/2013   Procedure: MAZE;  Surgeon: [name], MD;  Location: [place];  Service: Open Heart Surgery;  Laterality: N/A;   NO PAST SURGERIES     TEE WITHOUT CARDIOVERSION N/A 08/01/2013   Procedure: TRANSESOPHAGEAL ECHOCARDIOGRAM (TEE);  Surgeon: [name], MD;  Location: [place];  Service: Cardiovascular;  Laterality: N/A;   TEE WITHOUT CARDIOVERSION N/A 11/15/2021   Procedure: TRANSESOPHAGEAL ECHOCARDIOGRAM (TEE);  Surgeon: [name], MD;  Location: [facility] ENDOSCOPY;  Service: Cardiovascular;  Laterality: N/A;   TEE WITHOUT CARDIOVERSION N/A 03/10/2022   Procedure: TRANSESOPHAGEAL ECHOCARDIOGRAM (TEE);  Surgeon: [name], MD;  Location: [facility] ENDOSCOPY;  Service: Cardiovascular;  Laterality: N/A;     Current Outpatient Medications  Medication Sig Dispense Refill   amiodarone (PACERONE) 200 MG tablet Take 200 mg by mouth daily.     aspirin EC 81 MG tablet Take 1 tablet (81 mg total) by mouth daily. 90 tablet 3   carvedilol (COREG) 12.5 MG tablet TAKE 1 TABLET(12.5 MG) BY MOUTH TWICE DAILY WITH A MEAL 60 tablet 11   Ferrous Sulfate (IRON PO) Take 130 mg by mouth daily.     furosemide (LASIX) 20 MG tablet Take 1 tablet (20 mg total) by mouth as needed. 45 tablet 1   potassium chloride SA (KLOR-CON M) 20 MEQ tablet Take 1 tablet (20 mEq total) by mouth as needed. 20 tablet 4   spironolactone (ALDACTONE) 25 MG tablet TAKE 1 TABLET(25 MG) BY MOUTH DAILY 90 tablet 2   valsartan (DIOVAN) 80 MG tablet Take 1 tablet (80 mg total) by mouth 2 (two) times daily. 180 tablet 3   warfarin (COUMADIN) 5  MG tablet TAKE 1-1 1/2 TABLETS DAILY AS DIRECTED 40 tablet 3   No current facility-administered medications for this visit.    Allergies:   Morphine and related   Social History:  The patient  reports that he quit smoking about 12 years ago. His smoking use included cigarettes. He has a 10.00 pack-year smoking history. He has never used smokeless tobacco. He reports that he does not currently use alcohol. He reports that he does not use drugs.   Family History:  The patient's family history includes Coronary artery disease in his father; Heart failure in his father.    ROS:  Please see the history of present illness.   Otherwise, review of systems is positive for none.   All other systems are reviewed and negative.    PHYSICAL EXAM: VS:  BP 124/88   Pulse 61   Ht '5\' 8"'$  (1.727 m)   Wt 183 lb 3.2 oz (83.1 kg)   SpO2 97%   BMI 27.86 kg/m  , BMI Body mass index is 27.86 kg/m. GEN: Well nourished, well developed, in no acute distress  HEENT: normal  Neck:  no JVD, carotid bruits, or masses Cardiac: RRR; no murmurs, rubs, or gallops,no edema  Respiratory:  clear to auscultation bilaterally, normal work of breathing GI: soft, nontender, nondistended, + BS MS: no deformity or atrophy  Skin: warm and dry Neuro:  Strength and sensation are intact Psych: euthymic mood, full affect  EKG:  EKG is ordered today. Personal review of the ekg ordered shows sinus rhythm, rate 61  Recent Labs: 11/14/2021: Magnesium 2.2 02/12/2022: B Natriuretic Peptide 398.9 03/04/2022: Platelets 296 03/10/2022: Hemoglobin 11.2 03/31/2022: ALT 22; BUN 16; Creatinine, Ser 1.04; Potassium 4.6; Sodium 139; TSH 2.169    Lipid Panel     Component Value Date/Time   CHOL 212 (H) 07/04/2021 1552   TRIG 71 07/04/2021 1552   HDL 82 07/04/2021 1552   CHOLHDL 2.6 07/04/2021 1552   VLDL 14 07/04/2021 1552   LDLCALC 116 (H) 07/04/2021 1552     Wt Readings from Last 3 Encounters:  04/01/22 183 lb 3.2 oz (83.1 kg)   03/31/22 185 lb 9.6 oz (84.2 kg)  03/10/22 178 lb 9.2 oz (81 kg)      Other studies Reviewed: Additional studies/ records that were reviewed today include: TEE 12/16/21  Review of the above records today demonstrates:   1. Left ventricular ejection fraction, by estimation, is 50 to 55%. The  left ventricle has low normal function. The left ventricle has no regional  wall motion abnormalities.   2. Right ventricular systolic function is normal. The right ventricular  size is normal. Tricuspid regurgitation signal is inadequate for assessing  PA pressure.   3. The LA appendage has been ligated, no thrombus in the LA. Left atrial  size was mildly dilated. No left atrial/left atrial appendage thrombus was  detected.   4. No PFO/ASD by color doppler.   5. The mitral valve is normal in structure. Trivial mitral valve  regurgitation. No evidence of mitral stenosis.   6. Mechanical aortic valve without significant regurgitation, mean  gradient 7 mmHg.   7. 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 Normal caliber  thoracic aorta with minimal plaque.    ASSESSMENT AND PLAN:  1.  Persistent atrial fibrillation/atrial flutter: CHA2DS2-VASc of at least 2.  Currently on warfarin for mechanical aortic valve.  He has had multiple episodes of atrial flutter.  Currently on amiodarone 200 mg daily.  He was young age, it would be good for him to be off of amiodarone.  Due to that, we Daziya Redmond plan for ablation.  Risk, benefits, and alternatives to EP study and radiofrequency ablation for afib were also discussed in detail today. These risks include but are not limited to stroke, bleeding, vascular damage, tamponade, perforation, damage to the esophagus, lungs, and other structures, pulmonary vein stenosis, worsening renal function, and death. The patient understands these risk and wishes to proceed.  We Deniz Hannan therefore proceed with catheter ablation at the next available time.  Carto, ICE,  anesthesia are requested for the procedure.  Chandlor Noecker also obtain CT PV protocol prior to the procedure to exclude LAA thrombus and further evaluate atrial anatomy.   2.  Chronic [place] heart failure: Due to nonischemic cardiomyopathy.  Thought tachycardia mediated.  Continue management per primary cardiology.  Ejection fraction is low normal.  3.  Bicuspid aortic valve with severe AI: Post Bentall procedure.  Continue warfarin.  Plan per primary cardiology.  4.  Alcohol abuse: Complete cessation encouraged  5.  Hypertension: Currently well controlled  6.  Secondary hypercoagulable state: Currently on  warfarin for atrial fibrillation and mechanical aortic valve  7.  Suspected sleep apnea: Sleep study pending.    Current medicines are reviewed at length with the patient today.   The patient does not have concerns regarding his medicines.  The following changes were made today:  none  Labs/ tests ordered today include:  Orders Placed This Encounter  Procedures   CT CARDIAC MORPH/PULM VEIN W/CM&W/O CA SCORE   EKG 12-Lead     Disposition:   FU with Codie Krogh 3 months  Signed, Harmony Sandell Meredith Leeds, MD  04/01/2022 9:18 AM     [facility] [address] [place] [address] [phone] (office) [phone] (fax)

## 2022-04-01 NOTE — Telephone Encounter (Signed)
-----   Message from Stanton Kidney, RN sent at 04/01/2022  9:18 AM EDT ----- Regarding: Afib ablation 08/13/22 Pt scheduled for an AFib ablation on 08/13/22 Will need weekly INR checks prior to (3 weeks)  Alice Reichert RN

## 2022-04-07 ENCOUNTER — Ambulatory Visit: Payer: BC Managed Care – PPO | Attending: Cardiology

## 2022-05-01 ENCOUNTER — Telehealth (HOSPITAL_COMMUNITY): Payer: Self-pay | Admitting: Surgery

## 2022-05-01 NOTE — Telephone Encounter (Signed)
Patient called to confirm that he can proceed with completion of ordered home sleep study.  I left a message to proceed with the study.

## 2022-05-08 ENCOUNTER — Ambulatory Visit: Payer: BC Managed Care – PPO | Attending: Cardiology

## 2022-05-08 DIAGNOSIS — Z954 Presence of other heart-valve replacement: Secondary | ICD-10-CM

## 2022-05-08 DIAGNOSIS — Z9889 Other specified postprocedural states: Secondary | ICD-10-CM | POA: Diagnosis not present

## 2022-05-08 DIAGNOSIS — Z5181 Encounter for therapeutic drug level monitoring: Secondary | ICD-10-CM

## 2022-05-08 DIAGNOSIS — Z8679 Personal history of other diseases of the circulatory system: Secondary | ICD-10-CM

## 2022-05-08 DIAGNOSIS — I480 Paroxysmal atrial fibrillation: Secondary | ICD-10-CM | POA: Diagnosis not present

## 2022-05-08 DIAGNOSIS — I351 Nonrheumatic aortic (valve) insufficiency: Secondary | ICD-10-CM

## 2022-05-08 DIAGNOSIS — I359 Nonrheumatic aortic valve disorder, unspecified: Secondary | ICD-10-CM

## 2022-05-08 LAB — POCT INR: INR: 4.2 — AB (ref 2.0–3.0)

## 2022-05-08 NOTE — Patient Instructions (Signed)
HOLD TOMORROW ONLY then Continue taking Warfarin 1 tablet daily. Stay consistent with greens (3-4 times per week)  Recheck INR in  2 weeks [facility] [phone] or [phone], call with any changes in medication or up coming procedure.  *Amio '200mg'$  daily*

## 2022-05-20 ENCOUNTER — Ambulatory Visit: Payer: BC Managed Care – PPO | Attending: Cardiology

## 2022-06-06 ENCOUNTER — Other Ambulatory Visit (HOSPITAL_COMMUNITY): Payer: Self-pay | Admitting: Cardiology

## 2022-06-10 ENCOUNTER — Telehealth (HOSPITAL_COMMUNITY): Payer: Self-pay | Admitting: Surgery

## 2022-06-10 NOTE — Telephone Encounter (Signed)
I attempted to reach patient to remind him to perform the ordered home sleep study.  I left a message to indicate the above.

## 2022-06-11 ENCOUNTER — Encounter (HOSPITAL_BASED_OUTPATIENT_CLINIC_OR_DEPARTMENT_OTHER): Payer: BC Managed Care – PPO | Admitting: Cardiology

## 2022-06-11 DIAGNOSIS — G4733 Obstructive sleep apnea (adult) (pediatric): Secondary | ICD-10-CM

## 2022-06-15 NOTE — Procedures (Signed)
SLEEP STUDY REPORT Patient Information Study Date: 06/11/2022 Patient Name: [name] Hinton Patient ID: [id number] Birth Date: Jan 06, 1971 Age: 51 Gender: Male BMI: 37.5 (W=178 lb, H=4' 10'') Referring Physician: [name], MD  TEST DESCRIPTION:  Home sleep apnea testing was completed using the WatchPat, a Type 1 device, utilizing peripheral arterial tonometry (PAT), chest movement, actigraphy, pulse oximetry, pulse rate, body position and snore.  AHI was calculated with apnea and hypopnea using valid sleep time as the denominator. RDI includes apneas, hypopneas, and RERAs.  The data acquired and the scoring of sleep and all associated events were performed in accordance with the recommended standards and specifications as outlined in the AASM Manual for the Scoring of Sleep and Associated Events 2.2.0 (2015).  FINDINGS:  1.  Moderate Obstructive Sleep Apnea with AHI 25.9/hr.   2.  No Central Sleep Apnea with pAHIc 0/hr.  3.  Oxygen desaturations as low as 65%.  4.  Severe snoring was present. O2 sats were < 88% for 89.5 min.  5.  Total sleep time was 6 hrs and 4 min.  6.  26.5% of total sleep time was spent in REM sleep.   7.  Shortened sleep onset latency at 5 min  8.  Prolonged REM sleep onset latency at 117 min.   9.  Total awakenings were 12.  10. Arrhythmia detection: None.  DIAGNOSIS:   Moderate Obstructive Sleep Apnea (G47.33) Nocturnal Hypoxemia  RECOMMENDATIONS:   1.  Clinical correlation of these findings is necessary.  The decision to treat obstructive sleep apnea (OSA) is usually based on the presence of apnea symptoms or the presence of associated medical conditions such as Hypertension, Congestive Heart Failure, Atrial Fibrillation or Obesity.  The most common symptoms of OSA are snoring, gasping for breath while sleeping, daytime sleepiness and fatigue.   2.  Initiating apnea therapy is recommended given the presence of symptoms and/or associated conditions.  Recommend proceeding with one of the following:     a.  Auto-CPAP therapy with a pressure range of 5-20cm H2O.     b.  An oral appliance (OA) that can be obtained from certain dentists with expertise in sleep medicine.  These are primarily of use in non-obese patients with mild and moderate disease.     c.  An ENT consultation which may be useful to look for specific causes of obstruction and possible treatment options.     d.  If patient is intolerant to PAP therapy, consider referral to ENT for evaluation for hypoglossal nerve stimulator.   3.  Close follow-up is necessary to ensure success with CPAP or oral appliance therapy for maximum benefit.  4.  A follow-up oximetry study on CPAP is recommended to assess the adequacy of therapy and determine the need for supplemental oxygen or the potential need for Bi-level therapy.  An arterial blood gas to determine the adequacy of baseline ventilation and oxygenation should also be considered.  5.  Healthy sleep recommendations include:  adequate nightly sleep (normal 7-9 hrs/night), avoidance of caffeine after noon and alcohol near bedtime, and maintaining a sleep environment that is cool, dark and quiet.  6.  Weight loss for overweight patients is recommended.  Even modest amounts of weight loss can significantly improve the severity of sleep apnea.  7.  Snoring recommendations include:  weight loss where appropriate, side sleeping, and avoidance of alcohol before bed.  8.  Operation of motor vehicle should not be performed when sleepy. Signature:  Fransico Him, MD; [facility];  Diplomat, Tax adviser of Sleep Medicine Electronically Signed: 06/15/2022

## 2022-06-23 ENCOUNTER — Encounter (HOSPITAL_COMMUNITY): Payer: BC Managed Care – PPO | Admitting: Cardiology

## 2022-06-26 ENCOUNTER — Encounter (HOSPITAL_COMMUNITY): Payer: Self-pay | Admitting: Cardiology

## 2022-06-26 ENCOUNTER — Ambulatory Visit (HOSPITAL_COMMUNITY)
Admission: RE | Admit: 2022-06-26 | Discharge: 2022-06-26 | Disposition: A | Discharge status: Home / Self Care | Payer: BC Managed Care – PPO | Source: Ambulatory Visit | Attending: Cardiology | Admitting: Cardiology

## 2022-06-26 VITALS — BP 130/80 | HR 68 | Wt 184.4 lb

## 2022-06-26 DIAGNOSIS — I5022 Chronic systolic (congestive) heart failure: Secondary | ICD-10-CM

## 2022-06-26 DIAGNOSIS — I480 Paroxysmal atrial fibrillation: Secondary | ICD-10-CM | POA: Diagnosis not present

## 2022-06-26 DIAGNOSIS — I359 Nonrheumatic aortic valve disorder, unspecified: Secondary | ICD-10-CM

## 2022-06-26 DIAGNOSIS — I447 Left bundle-branch block, unspecified: Secondary | ICD-10-CM | POA: Diagnosis not present

## 2022-06-26 LAB — CBC
HCT: 28.4 % — ABNORMAL LOW (ref 39.0–52.0)
Hemoglobin: 8.5 g/dL — ABNORMAL LOW (ref 13.0–17.0)
MCH: 26.3 pg (ref 26.0–34.0)
MCHC: 29.9 g/dL — ABNORMAL LOW (ref 30.0–36.0)
MCV: 87.9 fL (ref 80.0–100.0)
Platelets: 271 10*3/uL (ref 150–400)
RBC: 3.23 MIL/uL — ABNORMAL LOW (ref 4.22–5.81)
RDW: 16.8 % — ABNORMAL HIGH (ref 11.5–15.5)
WBC: 4.8 10*3/uL (ref 4.0–10.5)
nRBC: 0 % (ref 0.0–0.2)

## 2022-06-26 LAB — COMPREHENSIVE METABOLIC PANEL
ALT: 16 U/L (ref 0–44)
AST: 20 U/L (ref 15–41)
Albumin: 4.3 g/dL (ref 3.5–5.0)
Alkaline Phosphatase: 41 U/L (ref 38–126)
Anion gap: 6 (ref 5–15)
BUN: 13 mg/dL (ref 6–20)
CO2: 27 mmol/L (ref 22–32)
Calcium: 9.3 mg/dL (ref 8.9–10.3)
Chloride: 108 mmol/L (ref 98–111)
Creatinine, Ser: 0.99 mg/dL (ref 0.61–1.24)
GFR, Estimated: >60 mL/min (ref >60)
Glucose, Bld: 103 mg/dL — ABNORMAL HIGH (ref 70–99)
Potassium: 4.6 mmol/L (ref 3.5–5.1)
Sodium: 141 mmol/L (ref 135–145)
Total Bilirubin: 0.6 mg/dL (ref 0.3–1.2)
Total Protein: 6.7 g/dL (ref 6.5–8.1)

## 2022-06-26 LAB — PROTIME-INR
INR: 2.3 — ABNORMAL HIGH (ref 0.8–1.2)
Prothrombin Time: 25.1 seconds — ABNORMAL HIGH (ref 11.4–15.2)

## 2022-06-26 LAB — TSH: TSH: 0.922 u[IU]/mL (ref 0.350–4.500)

## 2022-06-26 NOTE — Patient Instructions (Signed)
There has been no changes to your medications.  Labs done today, your results will be available in MyChart, we will contact you for abnormal readings.  Your physician recommends that you schedule a follow-up appointment in: 6 months ( June 2024)  ** please call the office in March to arrange your follow up appointment **  If you have any questions or concerns before your next appointment please send [place] a message through [place] or call our office at [phone].    TO LEAVE A MESSAGE FOR THE NURSE SELECT OPTION 2, PLEASE LEAVE A MESSAGE INCLUDING: YOUR NAME DATE OF BIRTH CALL BACK NUMBER REASON FOR CALL**this is important as we prioritize the call backs  YOU WILL RECEIVE A CALL BACK THE SAME DAY AS LONG AS YOU CALL BEFORE 4:00 PM  At the [facility], you and your health needs are our priority. As part of our continuing mission to provide you with exceptional heart care, we have created designated Provider Care Teams. These Care Teams include your primary Cardiologist (physician) and Advanced Practice Providers (APPs- Physician Assistants and Nurse Practitioners) who all work together to provide you with the care you need, when you need it.   You may see any of the following providers on your designated Care Team at your next follow up: Dr Glori Bickers Dr [name] Dr. Roxana Hires, NP Lyda Jester, [place] [facility] [place], [place] Forestine Na, NP Audry Riles, PharmD   Please be sure to bring in all your medications bottles to every appointment.

## 2022-06-28 NOTE — Progress Notes (Signed)
Patient ID: [name] Hinton, male   DOB: [birth year], 51 y.o.   MRN: [id number] PCP: Dr. Ashby Dawes Cardiology: Dr. Aundra Dubin  [name] Hinton is a 51 y.o.with a prior history of alcohol abuse and former smoker (quit  2011). Hospitalized 1/15 with increased dyspnea-->ECHO performed and showed EF 25%, global hypokinesis, RV mildly dilated,severe aortic insufficiency.  Patient had new-onset acute systolic CHF and atrial flutter. Started on cardizem drip and Xarelto. Initially in A flutter--> became A fib.   He had TEE and DC-CV on 08/01/13.  Dr Roxy Manns provided consultation due to ascending aortic aneurysm and bicuspid valve with severe AI. He was discharged on ACEI, beta blocker, spironolactone, and xarelto. Discharge weight 150 pounds.    Had cough with lisinopril and switched to losartan.  LHC/RHC was done 2/15 showing no coronary disease and elevated PCWP with pulmonary hypertension.   In 3/15, he had replacement of ascending aortic and aortic valve with Bentall procedure with mechanical aortic valve.  He also had Maze procedure and LAA obliteration.  He was noted to have gone into atypical atrial flutter by the time of his 3/30 followup with Dr. Roxy Manns.  He was started on amiodarone and went back into NSR. He is back at work.  Echo in 4/15 showed normally functioning mechanical aortic valve, but EF remained 30-35%.   Echo in 4/15 showed normally functioning mechanical aortic valve, but EF remained 30-35%.  ECHO 01/2014  EF up to 45% with normal mechanical aortic valve.  Echo 9/17 with increase in EF to 55-60% with mean gradient 13 mmHg across mechanical AoV.    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  No other neurological symptoms.   In 3/18, he was admitted with lower GI bleeding/symptomatic anemia.  He received 2 units PRBCs.  Suspected to be hemorrhoidal.  He had a colonoscopy confirming hemorrhoids and showing 1 polyp that was removed.    Echo in 11/19 showed EF 55% with mild LVH,  normal RV size with mildly decreased systolic function, normal mechanical aortic valve. Echo in 10/21 showed EF 50-55%, normal RV, moderate biatrial enlargement, mechanical aortic valve appeared to function normally.   Follow up 12/22, stable NYHA I symptoms and no longer drinking ETOH. Remained in NSR.   Back in AFL RVR 4/23. Underwent TEE and DCCV 11/15/21 --> NSR. EF 50-55%, normal RV, no LAA, AV ok, mean gradient 7 mmHg.  Patient had recurrent atypical atrial flutter with DCCV to NSR in 7/23.  Unfortunately back in AFL ~ 2 weeks later.   S/p TEE guided DCCV 03/10/22 to NSR. TEE showed EF 50-55%, normal RV, no LA thrombus, mechanical Ao valve mean gradient 6 mmHg  Patient returns for followup of atrial flutter.  He has stayed in NSR on amiodarone.  No palpitations.  He feels good overall, no exertional dyspnea or chest pain.  No BRBPR/melena.  He has been diagnosed with OSA and is awaiting CPAP.  Weight is stable.   ECG (personally reviewed): NSR, inferolateral TWIs  Labs (1/15): K 5.2, creatinine 1.3, AST 40, ALT 58 Labs (2/15): K 4.7, creatinine 1.1 Labs (4/15): K 4.9, creatinine 1.0, LFTs normal, TSH normal Labs (5/15): K 4.6, creatinine 1.1, BNP 134 Labs (6/15): K 4.8  creatinine 1.0. INR 2.4 Labs (03/01/14): K 4.7 Creatinine 1.16  Labs (11/15): K 4.8, creatinine 0.79 Labs (3/18): K 4.4, creatinine 0.8, hgb 10.2 Labs (7/18): K 4.2, creatinine 0.86, hgb 9.8 Labs (7/21): LDL 146, K 4.4, creatinine 0.92, hgb 13.5 Labs (  12/22): LDL 116, HDL 82 Labs (4/23): K 4.0, creatinine 0.89, hgb 13.4 Labs (5/23): K 4.8, creatinine 0.88 Labs (7/23): K 4.2, creatinine 0.89, BNP 399 Labs (9/23): TSH normal, K 4.6, creatinine 1.04, LFTs normal  PMH:  1. A fib/Aflutter: Initially flutter during 1/15 admission, degenerated to fibrillation --> S/P TEE DC-CV to NSR. Atypical atrial flutter recurred post-op in 3/15. 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  - Atypical flutter s/p TEE with DCCV 4/23 --> NSR. -  Atypical flutter s/p DCCV 7/23 to NSR - Atypical flutter s/p TEE with DCCV (8/23)-->NSR. 2. 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  TEE (1/15) with EF 25%, moderate LV dilation, bicuspid aortic valve with severe eccentric AI and no AS, ascending aorta 5.0 cm, moderately decreased RV systolic function.  CTA chest (1/15) with 5.1 cm ascending aortic aneurysm. Patient had Bentall replacement of ascending aorta with mechanical aortic valve and Maze with LAA obliteration in 3/15.  - TEE (4/23): mean gradient 7 mmHg. 3. Cardiomyopathy with systolic CHF: As above, TEE with EF 25% and moderately dilated LV, moderately decreased RV systolic function.  Tachy-mediated CMP versus ETOH CMP versus long-standing AI versus a combination of the three.  LHC/RHC (2/15): no coronary disease, mean RA 5, PA 62/25, mean PCWP 22, CI 1.8, PVR 5.2 WU, LVEDP 35 (severe AI).  Echo (2/15): EF 35-40%, moderately dilated LV, bicuspid aortic valve with severe AI.  Echo (4/15) with mildly dilated LV, mild LVH, EF 30-35%, normal RV size and systolic function, mechanical aortic valve appeared to function normally, IVC small.  Echo (7/15) with EF 45%, normal mechanical aortic valve, normal RV size with mildly decreased systolic function.  - Echo (9/17) with EF 55-60%, mechanical aortic valve with mean gradient 13 mmHg.  - Echo (11/19) with EF 55% with mild LVH, normal RV size with mildly decreased systolic function, normal mechanical aortic valve.  - Echo (10/21): EF 50-55%, normal RV, moderate biatrial enlargement, mechanical aortic valve appeared to function normally. - TEE (4/23): EF 50-55%, no regional WMAs, normal RV, no LA thrombus, no PFO/ASD, mechanical valve without significant regurgitation, mean gradient 7 mmHg. - TEE (8/23): EF 50-55%, normal RV, no LA thrombus, mechanical Ao valve mean gradient 6 mmgHg 4. Former smoker quit 2011. 5. Alcohol abuse but has  cut back considerably.  6. ACEI cough. 7. Palpitations: Holter monitor (12/15) with occasional PVCs and PACs.  8. Hyperlipidemia 9. OSA  FH: Father- Heart Failure, Grandfather CAD CABG   SH: Quit smoking 2011. Heavy ETOH until 1/15.  Engaged.  Works as Freight forwarder at [employer].   ROS: All systems negative except as listed in HPI, PMH and Problem List.  Current Outpatient Medications  Medication Sig Dispense Refill   amiodarone (PACERONE) 200 MG tablet Take 1 tablet (200 mg total) by mouth daily. 90 tablet 0   aspirin EC 81 MG tablet Take 1 tablet (81 mg total) by mouth daily. 90 tablet 3   carvedilol (COREG) 12.5 MG tablet TAKE 1 TABLET(12.5 MG) BY MOUTH TWICE DAILY WITH A MEAL 60 tablet 11   Ferrous Sulfate (IRON PO) Take 130 mg by mouth daily.     furosemide (LASIX) 20 MG tablet Take 1 tablet (20 mg total) by mouth as needed. 45 tablet 1   potassium chloride SA (KLOR-CON M) 20 MEQ tablet Take 1 tablet (20 mEq total) by mouth as needed. 20 tablet 4   spironolactone (ALDACTONE) 25 MG tablet TAKE 1 TABLET(25 MG) BY MOUTH  DAILY 90 tablet 2   valsartan (DIOVAN) 80 MG tablet Take 1 tablet (80 mg total) by mouth 2 (two) times daily. 180 tablet 3   warfarin (COUMADIN) 5 MG tablet TAKE 1-1 1/2 TABLETS DAILY AS DIRECTED 40 tablet 3   No current facility-administered medications for this encounter.   BP 130/80   Pulse 68   Wt 83.6 kg (184 lb 6.4 oz)   SpO2 99%   BMI 28.04 kg/m   Wt Readings from Last 3 Encounters:  06/26/22 83.6 kg (184 lb 6.4 oz)  04/01/22 83.1 kg (183 lb 3.2 oz)  03/31/22 84.2 kg (185 lb 9.6 oz)   PHYSICAL EXAM: General: NAD Neck: No JVD, no thyromegaly or thyroid nodule.  Lungs: Clear to auscultation bilaterally with normal respiratory effort. CV: Nondisplaced PMI.  Heart regular S1/S2 with mechanical S2, no S3/S4, 1/6 SEM RUSB.  No peripheral edema.  No carotid bruit.  Normal pedal pulses.  Abdomen: Soft, nontender, no hepatosplenomegaly, no distention.  Skin: Intact  without lesions or rashes.  Neurologic: Alert and oriented x 3.  Psych: Normal affect. Extremities: No clubbing or cyanosis.  HEENT: Normal.   ASSESSMENT & PLAN: 1. Paroxysmal atrial fibrillation/flutter: s/p TEE/DCCV 08/01/13. Had Maze/LAA obliteration with Bentall.  Post-op atypical flutter.  No longer on amiodarone.  He had DCCV in 4/23, 7/23, and again 8/23 with atypical flutter. He does not do well in atrial flutter.  He has quit drinking ETOH. He is in NSR today on amiodarone.  - He has not held sinus w/o antiarrhythmic and is now on amiodarone 200 mg daily. He will need regular eye exams. Check TSH and LFTs today. - EP has seen, has plan for atrial fibrillation/flutter ablation soon.  - He should remain off ETOH (prior heavy ETOH), work on weight loss, and start CPAP for OSA.  2. Chronic systolic CHF: Nonischemic cardiomyopathy.  Tachy-mediated CMP versus ETOH CMP versus long-standing aortic insufficiency or (most likely) a combination of all 3 possible causes.  He is no longer drinking and has had mechanical AVR.  Echo in 10/21 showed EF up to low normal, 50-55%. TEE (4/23) EF 50-55%, normal RV. TEE (8/23) showed EF  50-55%, normal RV. NYHA class II, not volume overloaded on exam.  - Continue Coreg 12.5 mg bid.  - Continue valsartan 80 mg bid. Intolerant ace due to cough.  - Continue spironolactone 25 mg daily.  BMET today 3. Bicuspid aortic valve disorder with severe AI and severely dilated ascending aorta (5.1 cm on CTA).  Status post mechanical AVR + ascending aorta replacement.  The valve looked good on 7/15 echo and TEE 4/23 and 8/23. Episode of possible amaurosis fugax in the past.  He is now on ASA 81 mg daily (which will be continued) and on warfarin with goal INR 2.5-3.5. INR managed [facility].  - Reinforced need for SBE prophylaxis. 4. ETOH abuse: He has mostly stopped drinking. 5. HTN: Stable. Continue current regimen. 6. Hyperlipidemia: Lipids ok 12/22.  7. OSA: Still  needs CPAP.   Followup 6 months APP  [name]  06/28/2022

## 2022-07-01 ENCOUNTER — Ambulatory Visit: Payer: BC Managed Care – PPO | Attending: Family Medicine

## 2022-07-01 ENCOUNTER — Other Ambulatory Visit: Payer: Self-pay

## 2022-07-01 DIAGNOSIS — G4733 Obstructive sleep apnea (adult) (pediatric): Secondary | ICD-10-CM

## 2022-07-01 DIAGNOSIS — Z8679 Personal history of other diseases of the circulatory system: Secondary | ICD-10-CM

## 2022-07-03 ENCOUNTER — Encounter (HOSPITAL_COMMUNITY): Payer: Self-pay

## 2022-07-06 ENCOUNTER — Encounter (HOSPITAL_COMMUNITY): Payer: Self-pay

## 2022-07-06 ENCOUNTER — Observation Stay (HOSPITAL_BASED_OUTPATIENT_CLINIC_OR_DEPARTMENT_OTHER)
Admission: EM | Admit: 2022-07-06 | Discharge: 2022-07-08 | Disposition: A | Discharge status: Home / Self Care | Payer: BC Managed Care – PPO | Attending: Internal Medicine | Admitting: Internal Medicine

## 2022-07-06 ENCOUNTER — Other Ambulatory Visit: Payer: Self-pay

## 2022-07-06 ENCOUNTER — Encounter (HOSPITAL_BASED_OUTPATIENT_CLINIC_OR_DEPARTMENT_OTHER): Payer: Self-pay

## 2022-07-06 ENCOUNTER — Telehealth: Payer: Self-pay | Admitting: Cardiology

## 2022-07-06 DIAGNOSIS — I110 Hypertensive heart disease with heart failure: Secondary | ICD-10-CM | POA: Insufficient documentation

## 2022-07-06 DIAGNOSIS — Z79899 Other long term (current) drug therapy: Secondary | ICD-10-CM | POA: Insufficient documentation

## 2022-07-06 DIAGNOSIS — D62 Acute posthemorrhagic anemia: Secondary | ICD-10-CM | POA: Insufficient documentation

## 2022-07-06 DIAGNOSIS — Z954 Presence of other heart-valve replacement: Secondary | ICD-10-CM

## 2022-07-06 DIAGNOSIS — I10 Essential (primary) hypertension: Secondary | ICD-10-CM | POA: Diagnosis present

## 2022-07-06 DIAGNOSIS — K921 Melena: Secondary | ICD-10-CM | POA: Diagnosis not present

## 2022-07-06 DIAGNOSIS — R9431 Abnormal electrocardiogram [ECG] [EKG]: Secondary | ICD-10-CM | POA: Diagnosis not present

## 2022-07-06 DIAGNOSIS — I480 Paroxysmal atrial fibrillation: Secondary | ICD-10-CM

## 2022-07-06 DIAGNOSIS — Z7901 Long term (current) use of anticoagulants: Secondary | ICD-10-CM | POA: Diagnosis not present

## 2022-07-06 DIAGNOSIS — Z7982 Long term (current) use of aspirin: Secondary | ICD-10-CM | POA: Insufficient documentation

## 2022-07-06 DIAGNOSIS — R7989 Other specified abnormal findings of blood chemistry: Secondary | ICD-10-CM | POA: Diagnosis not present

## 2022-07-06 DIAGNOSIS — D649 Anemia, unspecified: Secondary | ICD-10-CM

## 2022-07-06 DIAGNOSIS — K922 Gastrointestinal hemorrhage, unspecified: Secondary | ICD-10-CM | POA: Diagnosis not present

## 2022-07-06 DIAGNOSIS — Q231 Congenital insufficiency of aortic valve: Secondary | ICD-10-CM

## 2022-07-06 DIAGNOSIS — Z87891 Personal history of nicotine dependence: Secondary | ICD-10-CM | POA: Insufficient documentation

## 2022-07-06 DIAGNOSIS — I5022 Chronic systolic (congestive) heart failure: Secondary | ICD-10-CM | POA: Diagnosis present

## 2022-07-06 DIAGNOSIS — Q2381 Bicuspid aortic valve: Secondary | ICD-10-CM

## 2022-07-06 LAB — CBC WITH DIFFERENTIAL/PLATELET
Abs Immature Granulocytes: 0.01 10*3/uL (ref 0.00–0.07)
Basophils Absolute: 0.1 10*3/uL (ref 0.0–0.1)
Basophils Relative: 1 %
Eosinophils Absolute: 0.1 10*3/uL (ref 0.0–0.5)
Eosinophils Relative: 2 %
HCT: 22.8 % — ABNORMAL LOW (ref 39.0–52.0)
Hemoglobin: 6.7 g/dL — CL (ref 13.0–17.0)
Immature Granulocytes: 0 %
Lymphocytes Relative: 16 %
Lymphs Abs: 0.8 10*3/uL (ref 0.7–4.0)
MCH: 23.6 pg — ABNORMAL LOW (ref 26.0–34.0)
MCHC: 29.4 g/dL — ABNORMAL LOW (ref 30.0–36.0)
MCV: 80.3 fL (ref 80.0–100.0)
Monocytes Absolute: 0.6 10*3/uL (ref 0.1–1.0)
Monocytes Relative: 11 %
Neutro Abs: 3.6 10*3/uL (ref 1.7–7.7)
Neutrophils Relative %: 70 %
Platelets: 244 10*3/uL (ref 150–400)
RBC: 2.84 MIL/uL — ABNORMAL LOW (ref 4.22–5.81)
RDW: 19.7 % — ABNORMAL HIGH (ref 11.5–15.5)
WBC: 5.2 10*3/uL (ref 4.0–10.5)
nRBC: 0 % (ref 0.0–0.2)

## 2022-07-06 LAB — BASIC METABOLIC PANEL
Anion gap: 8 (ref 5–15)
BUN: 14 mg/dL (ref 6–20)
CO2: 26 mmol/L (ref 22–32)
Calcium: 9.3 mg/dL (ref 8.9–10.3)
Chloride: 102 mmol/L (ref 98–111)
Creatinine, Ser: 0.97 mg/dL (ref 0.61–1.24)
GFR, Estimated: >60 mL/min (ref >60)
Glucose, Bld: 109 mg/dL — ABNORMAL HIGH (ref 70–99)
Potassium: 4.4 mmol/L (ref 3.5–5.1)
Sodium: 136 mmol/L (ref 135–145)

## 2022-07-06 LAB — HEPATIC FUNCTION PANEL
ALT: 14 U/L (ref 0–44)
AST: 14 U/L — ABNORMAL LOW (ref 15–41)
Albumin: 4.7 g/dL (ref 3.5–5.0)
Alkaline Phosphatase: 37 U/L — ABNORMAL LOW (ref 38–126)
Bilirubin, Direct: 0.1 mg/dL (ref 0.0–0.2)
Indirect Bilirubin: 0.5 mg/dL (ref 0.3–0.9)
Total Bilirubin: 0.6 mg/dL (ref 0.3–1.2)
Total Protein: 6.8 g/dL (ref 6.5–8.1)

## 2022-07-06 LAB — PROTIME-INR
INR: 3.4 — ABNORMAL HIGH (ref 0.8–1.2)
Prothrombin Time: 33.7 seconds — ABNORMAL HIGH (ref 11.4–15.2)

## 2022-07-06 LAB — CBC
HCT: 22.1 % — ABNORMAL LOW (ref 39.0–52.0)
Hemoglobin: 6.6 g/dL — CL (ref 13.0–17.0)
MCH: 24.4 pg — ABNORMAL LOW (ref 26.0–34.0)
MCHC: 29.9 g/dL — ABNORMAL LOW (ref 30.0–36.0)
MCV: 81.5 fL (ref 80.0–100.0)
Platelets: 258 10*3/uL (ref 150–400)
RBC: 2.71 MIL/uL — ABNORMAL LOW (ref 4.22–5.81)
RDW: 19.6 % — ABNORMAL HIGH (ref 11.5–15.5)
WBC: 5.7 10*3/uL (ref 4.0–10.5)
nRBC: 0 % (ref 0.0–0.2)

## 2022-07-06 LAB — APTT: aPTT: 32 seconds (ref 24–36)

## 2022-07-06 LAB — OCCULT BLOOD X 1 CARD TO LAB, STOOL: Fecal Occult Bld: POSITIVE — AB

## 2022-07-06 NOTE — ED Notes (Signed)
Occult card at bedside 

## 2022-07-06 NOTE — Telephone Encounter (Signed)
Patient's wife called answering service concerned that the patient has had increased lethargy. Patient has been very fatigued, and has had worsening dyspnea on exertion. He also has a "yellow-green" appearance to his skin. Patient has a history of anemia, and has bleeding internal hemorrhoids. He is suppose to take iron supplementation and fiber, but he has not been compliant with these therapies. Wife reports that patient has continued to have bloody stools at home.   Patient had a CBC checked on 12/7 that showed his hemoglobin was 8.5. Dr. Aundra Dubin recommended that patient return to the office for Fe, TIBC, ferritin, B12, and folate labs, office staff was unable to reach patient by phone or mychart. Since then, he has not been compliant with his iron supplementation or fiber. He has continued to have bloody stools.   As patient had a hemoglobin of 8.5 about 10 days ago, and as he has continued to have bloody stools with worsening fatigue, doe, and weakness, I instructed patient's wife to take him to the ED.

## 2022-07-06 NOTE — Progress Notes (Signed)
Patient is a 51 year old male with history of bicuspid aortic valve status, ascending aortic aneurysm status post ascending aortic aneurysm and artery placement with mechanical aortic valve replacement, currently on warfarin, alcohol abuse, internal hemorrhoids, atrial fibrillation/flutter following with EP taking amiodarone, nonischemic cardiomyopathy, hypertension who presented at [facility] after the recommendation from cardiology office for the evaluation of anemia.  He was complaining of weakness, fatigue, bright red blood mixed with the stool.  On presentation his hemoglobin was 6.6, INR was 3.4. We requested to admit the patient for blood transfusion and further evaluation of his anemia.  I have recommended the ED physician to give on-call GI call for consult as well to investigate his anemia though it is most likely from internal hemorrhoids.

## 2022-07-06 NOTE — ED Triage Notes (Signed)
Pt states 10 days ago his hemoglobin is 8 and wants his hemoglobin checked. States has not been taking his iron and has been bleeding from Hemorid. Primary care doctor told him to come here.

## 2022-07-06 NOTE — ED Notes (Signed)
Rivka Barbara, PA aware of hgb of 6.7

## 2022-07-06 NOTE — ED Provider Notes (Signed)
[facility] EMERGENCY DEPT Provider Note   CSN: [id number] Arrival date & time: 07/06/22  1528     History  Chief Complaint  Patient presents with   Abnormal Lab    [name] Hinton is a 50 y.o. male.  With past medical history of bicuspid aortic valve s/p replacement on Coumadin, history of alcohol abuse, nonischemic cardiomyopathy who presents to the emergency department with low hemoglobin.  He states that he has internal hemorrhoids that tend to bleed.  He states that all last week he was having bleeding from the hemorrhoids.  He describes it as bright red blood mixed into his stool.  He states that he usually takes stool softeners, fiber and iron supplements that are over-the-counter but he ran out of his medications.  He states that for the past 2 days he has felt lethargic and had no energy.  He has also had dyspnea on exertion and feeling lightheaded when he is up and moving around.  He denies having any chest pain, syncope, abdominal pain.  He has not noticed any hematuria, hematemesis, gingival bleeding.  He had his hemoglobin checked in the outpatient setting and they noted that it was 8 which was about 10 days ago.  He was instructed by his cardiologist to come to the emergency department.   HPI     Home Medications Prior to Admission medications   Medication Sig Start Date End Date Taking? Authorizing Provider  amiodarone (PACERONE) 200 MG tablet Take 1 tablet (200 mg total) by mouth daily. 06/06/22   [name], MD  aspirin EC 81 MG tablet Take 1 tablet (81 mg total) by mouth daily. 10/15/18   [name], MD  carvedilol (COREG) 12.5 MG tablet TAKE 1 TABLET(12.5 MG) BY MOUTH TWICE DAILY WITH A MEAL 10/17/21   [name], MD  Ferrous Sulfate (IRON PO) Take 130 mg by mouth daily.    [provider]  furosemide (LASIX) 20 MG tablet Take 1 tablet (20 mg total) by mouth as needed. 02/12/22   Mil[place], Maricela Bo, FNP  potassium chloride SA  (KLOR-CON M) 20 MEQ tablet Take 1 tablet (20 mEq total) by mouth as needed. 02/12/22   Rafael Bihari, FNP  spironolactone (ALDACTONE) 25 MG tablet TAKE 1 TABLET(25 MG) BY MOUTH DAILY 03/05/22   [name], MD  valsartan (DIOVAN) 80 MG tablet Take 1 tablet (80 mg total) by mouth 2 (two) times daily. 11/20/21   Rafael Bihari, FNP  warfarin (COUMADIN) 5 MG tablet TAKE 1-1 1/2 TABLETS DAILY AS DIRECTED 03/17/22   [name], MD      Allergies    Morphine and related    Review of Systems   Review of Systems  Gastrointestinal:  Positive for blood in stool.  All other systems reviewed and are negative.   Physical Exam Updated Vital Signs BP 133/83   Pulse 62   Temp 97.8 F (36.6 C)   Resp 17   Ht '5\' 8"'$  (1.727 m)   Wt 81.6 kg   SpO2 91%   BMI 27.37 kg/m  Physical Exam Vitals and nursing note reviewed. Exam conducted with a chaperone present.  Constitutional:      General: He is not in acute distress.    Appearance: Normal appearance. He is well-developed. He is not ill-appearing.  HENT:     Head: Normocephalic and atraumatic.     Mouth/Throat:     Mouth: Mucous membranes are moist.  Pharynx: Oropharynx is clear.  Eyes:     Extraocular Movements: Extraocular movements intact.     Conjunctiva/sclera: Conjunctivae normal.  Cardiovascular:     Rate and Rhythm: Normal rate and regular rhythm.     Pulses: Normal pulses.          Radial pulses are 2+ on the right side and 2+ on the left side.     Heart sounds: No murmur heard.    Comments: Audible click from aortic valve replacement Trace bilateral lower extremity edema Pulmonary:     Effort: Pulmonary effort is normal. No respiratory distress.     Breath sounds: Normal breath sounds.  Abdominal:     General: Abdomen is protuberant. Bowel sounds are normal. There is no distension.     Palpations: Abdomen is soft.     Tenderness: There is no abdominal tenderness.  Genitourinary:    Rectum: Internal  hemorrhoid present. No mass, tenderness, anal fissure or external hemorrhoid. Normal anal tone.  Musculoskeletal:        General: No swelling.     Cervical back: Neck supple.  Skin:    General: Skin is warm and dry.     Capillary Refill: Capillary refill takes less than 2 seconds.     Coloration: Skin is pale.  Neurological:     Mental Status: He is alert.  Psychiatric:        Mood and Affect: Mood normal.     ED Results / Procedures / Treatments   Labs (all labs ordered are listed, but only abnormal results are displayed) Labs Reviewed  CBC - Abnormal; Notable for the following components:      Result Value   RBC 2.71 (*)    Hemoglobin 6.6 (*)    HCT 22.1 (*)    MCH 24.4 (*)    MCHC 29.9 (*)    RDW 19.6 (*)    All other components within normal limits  BASIC METABOLIC PANEL - Abnormal; Notable for the following components:   Glucose, Bld 109 (*)    All other components within normal limits  PROTIME-INR - Abnormal; Notable for the following components:   Prothrombin Time 33.7 (*)    INR 3.4 (*)    All other components within normal limits  HEPATIC FUNCTION PANEL - Abnormal; Notable for the following components:   AST 14 (*)    Alkaline Phosphatase 37 (*)    All other components within normal limits  OCCULT BLOOD X 1 CARD TO LAB, STOOL - Abnormal; Notable for the following components:   Fecal Occult Bld POSITIVE (*)    All other components within normal limits  APTT  TYPE AND SCREEN    EKG EKG Interpretation  Date/Time:  Sunday July 06 2022 15:46:59 EST Ventricular Rate:  62 PR Interval:  178 QRS Duration: 108 QT Interval:  482 QTC Calculation: 489 R Axis:   72 Text Interpretation: Normal sinus rhythm Incomplete left bundle branch block Prolonged QT Similar to prior EKG When compared with ECG of 26-Jun-2022 14:01, Sinus rhythm has replaced Junctional rhythm T wave inversion more evident in Inferior leads Confirmed by Cindee Lame [phone]) on 07/06/2022  4:56:27 PM  Radiology No results found.  Procedures Procedures   Medications Ordered in ED Medications - No data to display  ED Course/ Medical Decision Making/ A&P                           Medical Decision Making Amount  and/or Complexity of Data Reviewed Labs: ordered.  Risk Decision regarding hospitalization.  Initial Impression and Ddx 51 year old male who presents to the emergency department for dyspnea on exertion and known anemia on Coumadin.  He is pale appearing on exam.  He is otherwise hemodynamically stable.  He has no evidence of hemorrhage on exam.  Rectal exam with internal hemorrhoids.  No obvious blood on examiner's finger during digital rectal exam but fecal occult blood card sent. Patient PMH that increases complexity of ED encounter: Aortic valve repair on Coumadin, cardiomyopathy  Interpretation of Diagnostics I independent reviewed and interpreted the labs as followed: Hemoglobin is 6.6, INR 3.4, BUN is normal, bilirubin is not elevated, fecal occult blood is positive  - I independently visualized the following imaging with scope of interpretation limited to determining acute life threatening conditions related to emergency care: Not indicated  Patient Reassessment and Ultimate Disposition/Management This is a symptomatic anemia patient with a hemoglobin of 6.6 on Coumadin.  Appears that all of this bleeding is coming from known internal hemorrhoids.  He has not had any gingival bleeding, hematuria, hematemesis, melena. We obtained lab work which shows that he is still therapeutic with an INR of 3.4.  Fecal occult is positive.  Given that there is no BUN elevation I have less concern for brisk upper GI bleed or PUD.  He will require blood transfusion.  Given the complexity of this patient, as well as no clear etiology of his bleeding at this time will admit to medicine and have him evaluated by GI while he is receiving his blood transfusions.  He has known  history of hemorrhoids, however I do not appreciate large hemorrhoids on my digital rectal exam and there is no obvious hematochezia present.  Consulted and spoke with Dr. Waldron Labs who recommends GI consult and will admit the patient.  Patient management required discussion with the following services or consulting groups:  Hospitalist Service and Gastroenterology  Complexity of Problems Addressed Acute illness or injury that poses threat of life of bodily function  Additional Data Reviewed and Analyzed Further history obtained from: Past medical history and medications listed in the EMR, Prior ED visit notes, Recent Consult notes, Care Everywhere, and Prior labs/imaging results  Patient Encounter Risk Assessment Prescriptions, Consideration of hospitalization, and Major procedures  Final Clinical Impression(s) / ED Diagnoses Final diagnoses:  Symptomatic anemia    Rx / DC Orders ED Discharge Orders     None         Mickie Hillier, PA-C 07/06/22 1754    Audley Hose, MD 07/11/22 [phone]

## 2022-07-07 ENCOUNTER — Encounter (HOSPITAL_COMMUNITY): Payer: Self-pay | Admitting: Internal Medicine

## 2022-07-07 DIAGNOSIS — K922 Gastrointestinal hemorrhage, unspecified: Secondary | ICD-10-CM

## 2022-07-07 DIAGNOSIS — I5022 Chronic systolic (congestive) heart failure: Secondary | ICD-10-CM | POA: Diagnosis not present

## 2022-07-07 DIAGNOSIS — I10 Essential (primary) hypertension: Secondary | ICD-10-CM | POA: Diagnosis present

## 2022-07-07 DIAGNOSIS — D649 Anemia, unspecified: Secondary | ICD-10-CM

## 2022-07-07 DIAGNOSIS — I480 Paroxysmal atrial fibrillation: Secondary | ICD-10-CM | POA: Diagnosis not present

## 2022-07-07 DIAGNOSIS — R9431 Abnormal electrocardiogram [ECG] [EKG]: Secondary | ICD-10-CM | POA: Diagnosis present

## 2022-07-07 DIAGNOSIS — Z954 Presence of other heart-valve replacement: Secondary | ICD-10-CM

## 2022-07-07 LAB — HEMOGLOBIN AND HEMATOCRIT, BLOOD
HCT: 27.2 % — ABNORMAL LOW (ref 39.0–52.0)
Hemoglobin: 8 g/dL — ABNORMAL LOW (ref 13.0–17.0)

## 2022-07-07 LAB — PROTIME-INR
INR: 2.5 — ABNORMAL HIGH (ref 0.8–1.2)
INR: 2.6 — ABNORMAL HIGH (ref 0.8–1.2)
Prothrombin Time: 26.9 seconds — ABNORMAL HIGH (ref 11.4–15.2)
Prothrombin Time: 27.5 seconds — ABNORMAL HIGH (ref 11.4–15.2)

## 2022-07-07 LAB — PREPARE RBC (CROSSMATCH)

## 2022-07-07 MED ORDER — SODIUM CHLORIDE 0.9 % IV SOLN: INTRAVENOUS | Status: DC

## 2022-07-07 MED ORDER — SODIUM CHLORIDE 0.9% IV SOLUTION: Freq: Once | INTRAVENOUS | Status: AC

## 2022-07-07 MED ORDER — IRBESARTAN 75 MG PO TABS
75.0000 mg | ORAL_TABLET | Freq: Two times a day (BID) | ORAL | Status: DC
  Administered 2022-07-07 – 2022-07-08 (×2): 75 mg via ORAL
  Filled 2022-07-07 (×4): qty 1

## 2022-07-07 MED ORDER — ONDANSETRON HCL 4 MG/2ML IJ SOLN: 4.0000 mg | Freq: Four times a day (QID) | INTRAMUSCULAR | Status: DC | PRN

## 2022-07-07 MED ORDER — MAGNESIUM SULFATE 2 GM/50ML IV SOLN
2.0000 g | Freq: Once | INTRAVENOUS | Status: AC
  Administered 2022-07-07: 2 g via INTRAVENOUS
  Filled 2022-07-07: qty 50

## 2022-07-07 MED ORDER — CARVEDILOL 12.5 MG PO TABS: 12.5000 mg | ORAL_TABLET | Freq: Two times a day (BID) | ORAL | Status: DC

## 2022-07-07 MED ORDER — ACETAMINOPHEN 650 MG RE SUPP: 650.0000 mg | Freq: Four times a day (QID) | RECTAL | Status: DC | PRN

## 2022-07-07 MED ORDER — AMIODARONE HCL 200 MG PO TABS
200.0000 mg | ORAL_TABLET | Freq: Every day | ORAL | Status: DC
  Administered 2022-07-07 – 2022-07-08 (×2): 200 mg via ORAL
  Filled 2022-07-07 (×2): qty 1

## 2022-07-07 MED ORDER — ACETAMINOPHEN 325 MG PO TABS: 650.0000 mg | ORAL_TABLET | Freq: Four times a day (QID) | ORAL | Status: DC | PRN

## 2022-07-07 MED ORDER — PROCHLORPERAZINE EDISYLATE 10 MG/2ML IJ SOLN: 10.0000 mg | Freq: Four times a day (QID) | INTRAMUSCULAR | Status: DC | PRN

## 2022-07-07 MED ORDER — SPIRONOLACTONE 25 MG PO TABS
25.0000 mg | ORAL_TABLET | Freq: Every day | ORAL | Status: DC
  Administered 2022-07-08: 25 mg via ORAL
  Filled 2022-07-07 (×3): qty 1

## 2022-07-07 MED ORDER — CARVEDILOL 12.5 MG PO TABS
12.5000 mg | ORAL_TABLET | Freq: Two times a day (BID) | ORAL | Status: DC
  Administered 2022-07-07 – 2022-07-08 (×3): 12.5 mg via ORAL
  Filled 2022-07-07 (×3): qty 1

## 2022-07-07 MED ORDER — ONDANSETRON HCL 4 MG PO TABS: 4.0000 mg | ORAL_TABLET | Freq: Four times a day (QID) | ORAL | Status: DC | PRN

## 2022-07-07 NOTE — Consult Note (Addendum)
Cardiology Consultation   Patient ID: [name] Hinton MRN: [id number]; DOB: 11/20/1970  Admit date: 07/06/2022 Date of Consult: 07/07/2022  PCP:  Merrilee Seashore, [place] Providers Cardiologist:  [name], MD  Electrophysiologist:  Will Meredith Leeds, MD  Advanced Heart Failure:  [name], MD  Sleep Medicine:  Fransico Him, MD       Patient Profile:   [name] Hinton is a 51 y.o. male with a hx of NICM, dx in 2015 with severe aortic insuff, atrial flutter to atrial fib.  No coronary disease on cath, and in March 2015 he had surgery with replacement of ascending aorta and aortic valve with Bentall procedure with mechanical aortic valve (using a Carbo medics Carbo-Seal Valsalva, valve size 29 mm) .  Also with Maze procedure.  EF by 2017 improved to 55-60% who is being seen 07/07/2022 for the evaluation of anticoagulation combination of GI bleed at the request of Dr. Olevia Bowens.  History of Present Illness:   [name] Hinton with complex cardiac hx including NICM, aortic insuff, atrial flutter/fib, s/p replacement of  ascending aorta and aortic valve with Bentall procedure with mechanical aortic valve. (using a Carbo medics Carbo-Seal Valsalva, valve size 29 mm)  Also with Maze procedure.  By 2017 EF was up to 55-60% with mean gradient 13 mmHg across mechanical AoV.    Pt with hx of tobacco and ETOH use no longer drinking or using tobacco.   ACEI cough.  Hx GI bleed 2018 due to hemorrhoids. With colonoscopy 1 polyp removed.    Hx of A flutter 4/23 with TEE and DCCV to SR EF 50-55% normal RV no LAA, AV ok mean gradient 52mHg.  In July recurrent atypical atrial flutter with DCCV to SR in 7?23, 2 weeks later back in a flutter.    03/10/22 he underwent TEE guided DCCV 03/10/22 to SR TEE showed EF 50-55%, normal RV, no LA thrombus, mechanical Ao valve mean gradient 6 mmHg --he is on amiodarone.  He has been diagnosed with OSA and awaiting CPAP.  Was stable 06/26/22 on visit with  Dr. MAundra Dubin  He is on coumadin with mechanical aortic valve. Also note plan for ablation per Dr. CCurt Bears1/24/24.   Pt called yesterday with ongoing rectal bleeding and hgb on the 7th of Dec of 8,  we instructed to go to ER.   Hgb was 6.6 with plts 258 and WBC 5.7   coumadin held  Na 136, K+ 4.4 Cr 0.97 alk phos 37 ASt 14 ALT 14  INR 3.4 on admit now 2.5  Recent TSH 0.922  2 units PRBCs to be transfused. [facility]    No chest pain, no palpitations, no fever of chills.  No SOB last BM 2.5 days ago, no bleeding since then.   EKG:  The EKG was personally reviewed and demonstrates:  incomplete LBBB SR Qtc 480 ms on amiodarone Telemetry:  Telemetry was personally reviewed and demonstrates:  sinus brady 58  BP 184/120 to 135/83  P 54 R 18-27  afebrile.   Past Medical History:  Diagnosis Date   Dysrhythmia    ETOH abuse    Heart murmur    Hx of repair of ascending aorta 09/27/2013   Non-ischemic cardiomyopathy (HCC)    PAF (paroxysmal atrial fibrillation) ([place]    a. Dx 07/2013, s/p TEE/DCCV.   Paroxysmal atrial flutter ([facility]    a. Dx 07/2013.  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 Bicuspid aortic valve with severe AI, dilated ascending aorta dx 07/2013.   Systolic CHF ([place])    a. Dx 07/2013: EF 25%, felt likely related to combo of EtOH + tachy-mediated + AI. LHC pending.    Past Surgical History:  Procedure Laterality Date   AORTIC VALVE REPLACEMENT  2015   (using a Carbo medics Carbo-Seal Valsalva, valve size 29 mm)   ASCENDING AORTIC ROOT REPLACEMENT N/A 09/27/2013   Procedure:  ASCENDING AORTIC ROOT REPLACEMENT;  Surgeon: [name], MD;  Location: [place];  Service: Open Heart Surgery;  Laterality: N/A;   BENTALL PROCEDURE N/A 09/27/2013   Procedure: BENTALL PROCEDURE;  Surgeon: [name], MD;  Location: [place];  Service: Open Heart Surgery;  Laterality: N/A;   CARDIAC CATHETERIZATION     CARDIOVERSION N/A 08/01/2013   Procedure: CARDIOVERSION;  Surgeon: [name], MD;  Location: [place];  Service: Cardiovascular;  Laterality: N/A;   CARDIOVERSION N/A 11/15/2021   Procedure: CARDIOVERSION;  Surgeon: [name], MD;  Location: [facility] ENDOSCOPY;  Service: Cardiovascular;  Laterality: N/A;   CARDIOVERSION N/A 02/14/2022   Procedure: CARDIOVERSION;  Surgeon: [name], MD;  Location: [facility] ENDOSCOPY;  Service: Cardiovascular;  Laterality: N/A;   CARDIOVERSION N/A 03/10/2022   Procedure: CARDIOVERSION;  Surgeon: [name], MD;  Location: [facility] ENDOSCOPY;  Service: Cardiovascular;  Laterality: N/A;   COLONOSCOPY WITH PROPOFOL Left 10/10/2016   Procedure: COLONOSCOPY WITH PROPOFOL;  Surgeon: Arta Silence, MD;  Location: [facility] ENDOSCOPY;  Service: Endoscopy;  Laterality: Left;   INTRAOPERATIVE TRANSESOPHAGEAL ECHOCARDIOGRAM N/A 09/27/2013   Procedure: INTRAOPERATIVE TRANSESOPHAGEAL ECHOCARDIOGRAM;  Surgeon: [name], MD;  Location: [place];  Service: Open Heart Surgery;  Laterality: N/A;   LEFT AND RIGHT HEART CATHETERIZATION WITH CORONARY ANGIOGRAM N/A 09/05/2013   Procedure: LEFT AND RIGHT HEART CATHETERIZATION WITH CORONARY ANGIOGRAM;  Surgeon: [name], MD;  Location: [facility] CATH LAB;  Service: Cardiovascular;  Laterality: N/A;   MAZE N/A 09/27/2013   Procedure: MAZE;  Surgeon: [name], MD;  Location: [place];  Service: Open Heart Surgery;  Laterality: N/A;   NO PAST SURGERIES     TEE WITHOUT CARDIOVERSION N/A 08/01/2013   Procedure: TRANSESOPHAGEAL ECHOCARDIOGRAM (TEE);  Surgeon: [name], MD;  Location: [place];   Service: Cardiovascular;  Laterality: N/A;   TEE WITHOUT CARDIOVERSION N/A 11/15/2021   Procedure: TRANSESOPHAGEAL ECHOCARDIOGRAM (TEE);  Surgeon: [name], MD;  Location: [facility] ENDOSCOPY;  Service: Cardiovascular;  Laterality: N/A;   TEE WITHOUT CARDIOVERSION N/A 03/10/2022   Procedure: TRANSESOPHAGEAL ECHOCARDIOGRAM (TEE);  Surgeon: [name], MD;  Location: [facility] ENDOSCOPY;  Service: Cardiovascular;  Laterality: N/A;     Home Medications:  Prior to Admission medications   Medication Sig Start Date End Date Taking? Authorizing Provider  amiodarone (PACERONE) 200 MG tablet Take 1 tablet (200 mg total) by mouth daily. 06/06/22   [name], MD  aspirin EC 81 MG tablet Take 1 tablet (81 mg total) by mouth daily. 10/15/18   [name], MD  carvedilol (COREG) 12.5 MG tablet TAKE 1 TABLET(12.5 MG) BY MOUTH TWICE DAILY WITH A MEAL 10/17/21  [name], MD  Ferrous Sulfate (IRON PO) Take 130 mg by mouth daily.    [provider]  furosemide (LASIX) 20 MG tablet Take 1 tablet (20 mg total) by mouth as needed. 02/12/22   [place], Maricela Bo, FNP  potassium chloride SA (KLOR-CON M) 20 MEQ tablet Take 1 tablet (20 mEq total) by mouth as needed. 02/12/22   Rafael Bihari, FNP  spironolactone (ALDACTONE) 25 MG tablet TAKE 1 TABLET(25 MG) BY MOUTH DAILY 03/05/22   [name], MD  valsartan (DIOVAN) 80 MG tablet Take 1 tablet (80 mg total) by mouth 2 (two) times daily. 11/20/21   Rafael Bihari, FNP  warfarin (COUMADIN) 5 MG tablet TAKE 1-1 1/2 TABLETS DAILY AS DIRECTED 03/17/22   [name], MD    Inpatient Medications: Scheduled Meds:  sodium chloride   Intravenous Once   amiodarone  200 mg Oral Daily   carvedilol  12.5 mg Oral BID WC   irbesartan  75 mg Oral BID   spironolactone  25 mg Oral BID   Continuous Infusions:  sodium chloride     PRN Meds: acetaminophen **OR** acetaminophen, prochlorperazine  Allergies:    Allergies  Allergen  Reactions   Morphine And Related Nausea Only    Social History:   Social History   Socioeconomic History   Marital status: Married    Spouse name: Not on file   Number of children: Not on file   Years of education: Not on file   Highest education level: Not on file  Occupational History   Occupation: Restaurant work at [employer] Use   Smoking status: Former    Packs/day: 0.50    Years: 20.00    Total pack years: 10.00    Types: Cigarettes    Quit date: 09/25/2009    Years since quitting: 12.7   Smokeless tobacco: Never  Vaping Use   Vaping Use: Never used  Substance and Sexual Activity   Alcohol use: Not Currently    Comment: [place] Every Night    Drug use: No   Sexual activity: Not on file  Other Topics Concern   Not on file  Social History Narrative   Lives with wife.   Social Determinants of Health   Financial Resource Strain: Not on file  Food Insecurity: Not on file  Transportation Needs: Not on file  Physical Activity: Not on file  Stress: Not on file  Social Connections: Not on file  Intimate Partner Violence: Not on file    Family History:    Family History  Problem Relation Age of Onset   Heart failure Father    Coronary artery disease Father        CABG     ROS:  Please see the history of present illness.  General:no colds or fevers, no weight changes Skin:no rashes or ulcers HEENT:no blurred vision, no congestion CV:see HPI PUL:see HPI GI:no diarrhea constipation + melena, no indigestion GU:no hematuria, no dysuria MS:no joint pain, no claudication Neuro:no syncope, no lightheadedness Endo:no diabetes, no thyroid disease  All other ROS reviewed and negative.     Physical Exam/Data:   Vitals:   07/07/22 0653 07/07/22 0810 07/07/22 1104 07/07/22 1134  BP: 134/74 (!) 140/94 (!) 180/110 135/83  Pulse: 60 (!) 59 62 (!) 59  Resp: (!) _0 Temp: 98.5 F (36.9 C) 97.7 F (36.5 C)  97.9 F (36.6 C)  TempSrc: Oral Oral   Oral  SpO2:  94% 97%  99%  Weight:      Height:       No intake or output data in the 24 hours ending 07/07/22 1250    07/06/2022    3:39 PM 06/26/2022    1:56 PM 04/01/2022    8:41 AM  Last 3 Weights  Weight (lbs) 180 lb 184 lb 6.4 oz 183 lb 3.2 oz  Weight (kg) 81.647 kg 83.643 kg 83.099 kg     Body mass index is 27.37 kg/m.  General:  Well nourished, well developed, in no acute distress HEENT: normal Neck: no JVD Vascular: No carotid bruits; Distal pulses 2+ bilaterally Cardiac:  normal S1, S2; RRR; no murmur crisp closure of aortic valve Lungs:  clear to auscultation bilaterally, no wheezing, rhonchi or rales  Abd: soft, nontender, no hepatomegaly  Ext: no edema Musculoskeletal:  No deformities, BUE and BLE strength normal and equal Skin: warm and dry  Neuro:  alert and oriented X 3 MAE follows commands, no focal abnormalities noted Psych:  Normal affect    Relevant CV Studies:  Echo (9/17) with EF 55-60%, mechanical aortic valve with mean gradient 13 mmHg.  - Echo (11/19) with EF 55% with mild LVH, normal RV size with mildly decreased systolic function, normal mechanical aortic valve.  - Echo (10/21): EF 50-55%, normal RV, moderate biatrial enlargement, mechanical aortic valve appeared to function normally. - TEE (4/23): EF 50-55%, no regional WMAs, normal RV, no LA thrombus, no PFO/ASD, mechanical valve without significant regurgitation, mean gradient 7 mmHg. - TEE (8/23): EF 50-55%, normal RV, no LA thrombus, mechanical Ao valve mean gradient 6 mmgHg  LHC/RHC (2/15): no coronary disease, mean RA 5, PA 62/25, mean PCWP 22, CI 1.8, PVR 5.2 WU, LVEDP 35 (severe AI).   Laboratory Data:  High Sensitivity Troponin:  No results for input(s): "TROPONINIHS" in the last 720 hours.   Chemistry Recent Labs  Lab 07/06/22 1621  NA 136  K 4.4  CL 102  CO2 26  GLUCOSE 109*  BUN 14  CREATININE 0.97  CALCIUM 9.3  GFRNONAA >60  ANIONGAP 8    Recent Labs  Lab 07/06/22 1622   PROT 6.8  ALBUMIN 4.7  AST 14*  ALT 14  ALKPHOS 37*  BILITOT 0.6   Lipids No results for input(s): "CHOL", "TRIG", "HDL", "LABVLDL", "LDLCALC", "CHOLHDL" in the last 168 hours.  Hematology Recent Labs  Lab 07/06/22 1551 07/06/22 2010 07/07/22 0626  WBC 5.7 5.2  --   RBC 2.71* 2.84*  --   HGB 6.6* 6.7* 6.4*  HCT 22.1* 22.8* 21.4*  MCV 81.5 80.3  --   MCH 24.4* 23.6*  --   MCHC 29.9* 29.4*  --   RDW 19.6* 19.7*  --   PLT 258 244  --    Thyroid No results for input(s): "TSH", "FREET4" in the last 168 hours.  BNPNo results for input(s): "BNP", "PROBNP" in the last 168 hours.  DDimer No results for input(s): "DDIMER" in the last 168 hours.   Radiology/Studies:  No results found.   Assessment and Plan:   GI Bleed with acute anemia requiring blood transfusion --GI consult - currently coumadin on hold  he is on iron  PAF, s/p Bentall aortic root replacement with mechanical valve and maze procedure.  On amiodarone 200 daily and plan for ablation with [facility] 08/13/22  maintaining SR.  May need to bridge to heparin for colonoscopy    on TEE 03/10/22 Aortic valve peak gradient measures 8.9 mmHg.  Aortic valve area, by VTI measures 3.91 cm. There is a 27 mm 29 mm Sorin Carbomedics Carbo-seal Valsalve mechanical valve conduit valve present in the aortic position    Prolonged Qtc but is on amiodarone has been given 2G IV Mg+  goal K+ 4.0 and Mg+ 2.0 Chronic systolic CHF with hx NICM EF in 2015 was 25% but with valve replacement and medical therapy most recent EF on TEE 50-55% 03/10/22  on diovan, spironolactone coreg 12.5 BID, asa  OSA awaiting CPAP   Risk Assessment/Risk Scores:        New York Heart Association (NYHA) Functional Class NYHA Class II  CHA2DS2-VASc Score = 1   This indicates a 0.6% annual risk of stroke. The patient's score is based upon: CHF History: 1 HTN History: 0 Diabetes History: 0 Stroke History: 0 Vascular Disease History: 0 Age Score: 0 Gender  Score: 0          For questions or updates, please contact [place] Please consult [email or web address] for contact info under    Signed, Cecilie Kicks, NP  07/07/2022 12:50 PM   Patient seen and examined and agree with Cecilie Kicks, NP as detailed above.  In brief, the patient is a 51 y.o. male with a hx of NICM with improved EF (25%>>50-55%), bicuspid aortic valve with severe aortic insufficinecy s/p replacement of ascending aorta and aortic valve with Bentall procedure with mechanical aortic valve (using a Car[employer] medics Carbo-Seal Valsalva, valve size 29 mm) and MAZE procedure in 2015 on warfarin for [facility] who presented with acute GIB. Cardiology is consulted for management of AC in the setting of acute bleed.  Patient with known history of bicuspid aortic valve with severe AR s/p Bentall with mechanical AVR and MAZE in 2015. Also with history of NICM with EF 25% that improved to 50-55% on TEE in 09/2021. Cath in 2015 with clean coronaries. He has been maintained on warfarin for his mechanical AVR and Afib with goal INR 2.5-3.5. Has history of GIB in the past thought to be hemorrhoidal at that time.  He presents on this admission with rectal bleeding with hemoglobin 6.6. INR 3.4>2.5. Otherwise HD stable. He received 2units of pRBC and warfarin has been held. GI consulted.  Will continue to hold warfarin for now with plans to start IV heparin once INR<2.5 unless ongoing active bleeding. GI has been consulted and will await recommendations.   GEN: No acute distress.   Neck: No JVD Cardiac: RRR, 2/6 systolic murmur, +mechanical click  Respiratory: Clear to auscultation bilaterally. GI: Soft, nontender, non-distended  MS: No edema; No deformity. Neuro:  Nonfocal  Psych: Normal affect    Plan: -Holding warfarin for now pending GI evaluation -Plan to start heparin gtt once INR<2.5 unless ongoing active bleeding -Continue home coreg 12.59m daily, irbesartan 789mBID and spiro 2534mdaily -Continue home amiodarone 200m45mily  HeatGwyndolyn Kaufman

## 2022-07-07 NOTE — ED Notes (Signed)
Dr. Regenia Skeeter aware of 6.4 Hgb.

## 2022-07-07 NOTE — ED Notes (Signed)
Handoff report given to [facility] and to [employer] charge at [facility] ER

## 2022-07-07 NOTE — Progress Notes (Signed)
ANTICOAGULATION CONSULT NOTE - Initial Consult  Pharmacy Consult for heparin Indication:  atrial fibrillation, mechanical aortic valve.  Bridging with heparin when INR <2.5. Holding warfarin.  Allergies  Allergen Reactions   Morphine And Related Nausea Only    Patient Measurements: Height: '5\' 8"'$  (172.7 cm) Weight: 81.6 kg (180 lb) IBW/kg (Calculated) : 68.4 Heparin Dosing Weight: n/a. Use actual body weight of 81.6 kg  Vital Signs: Temp: 98 F (36.7 C) (12/18 1646) Temp Source: Oral (12/18 1646) BP: 119/70 (12/18 1646) Pulse Rate: 63 (12/18 1646)  Labs: Recent Labs    07/06/22 1551 07/06/22 1621 07/06/22 2010 07/07/22 0626 07/07/22 0857  HGB 6.6*  --  6.7* 6.4*  --   HCT 22.1*  --  22.8* 21.4*  --   PLT 258  --  244  --   --   APTT  --  32  --   --   --   LABPROT  --  33.7*  --   --  26.9*  INR  --  3.4*  --   --  2.5*  CREATININE  --  0.97  --   --   --     Estimated Creatinine Clearance: 89.1 mL/min (by C-G formula based on SCr of 0.97 mg/dL).   Medical History: Past Medical History:  Diagnosis Date   Dysrhythmia    ETOH abuse    Heart murmur    Hx of repair of ascending aorta 09/27/2013   Non-ischemic cardiomyopathy (HCC)    PAF (paroxysmal atrial fibrillation) ([place])    a. Dx 07/2013, s/p TEE/DCCV.   Paroxysmal atrial flutter ([place])    a. Dx 07/2013.   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 Bicuspid aortic valve with severe AI, dilated ascending aorta dx 07/2013.   Systolic CHF ([place])    a. Dx 07/2013: EF 25%, felt likely related to combo of EtOH + tachy-mediated + AI. LHC pending.    Medications: Warfarin PTA  -Home  dose: 5 mg PO daily -INR goal: 2.5 - 3.5 per [facility] clinic note on 05/08/22 -Last dose prior to admission: 12/17 ~0700  Assessment: Pt is a 48 yoM presenting with recent persistent rectal bleeding with associated weakness, mild dyspnea on exertion, fatigue, and somnolence. Pt has PMH significant for atrial fibrillation and mechanical aortic valve replacement (2015) for which he is chronically anticoagulated with warfarin. PMH also significant for lower GIB in 2018 and underwent colonoscopy - had 8 mm adenoma removed and internal hemorrhoids banded. Pt reports intermittent bleeding since that time, but has worsened over the past 2 weeks.   Pt reports recent non-compliance with fiber and iron supplementation. Hgb 6.6 on admission (down from 8.5 on 06/26/22).   Cardiology and GI have been consulted. Planning for inpatient colonoscopy and initiation of heparin drip with no bolus once INR <2.5 (pharmacy to dose - discussed with Dr. Olevia Bowens).  Today, 07/07/22 INR = 2.5 was therapeutic this morning. MD ordered repeat INR this evening, which remains therapeutic at 2.6. CBC: Hgb 6.4 > 8 s/p transfusion . Plt WNL SCr WNL   Goal of Therapy:  Heparin level 0.3-0.7 units/ml INR 2.5 - 3.5 Monitor platelets by anticoagulation protocol: Yes  Plan:  Holding warfarin Recheck INR with AM labs tomorrow. Once INR < 2.5, will initiate heparin drip (with no bolus)  Lenis Noon, PharmD 07/07/2022,5:50 PM

## 2022-07-07 NOTE — ED Provider Notes (Signed)
   ED Course / MDM    Medical Decision Making Amount and/or Complexity of Data Reviewed Labs: ordered.  Risk Decision regarding hospitalization.   Pt arrived to the ED admitted to hospitalist from [facility] ED as a boarder. Hgb 6.4 on Warfarin INR 3.4 last night. Needs blood transfusion. Nursing notified to contact the hospitalist service on patient arrival.        Regan Lemming, MD 07/07/22 [phone]

## 2022-07-07 NOTE — ED Notes (Signed)
07:50 Thomas @ [facility] will send transport for for [facility] to [facility] transfer.-ABB (NS)

## 2022-07-07 NOTE — Consult Note (Signed)
[facility] Consultation Note  Referring Provider: [facility] Primary Care Physician:  Merrilee Seashore, MD Primary Gastroenterologist:  Dr. Paulita Fujita  Reason for Consultation:  Hematochezia, anemia  HPI: [name] Hinton is a 51 y.o. male admitted hematochezia, weakness, acute anemia.  Prior colonoscopy 2018 for similar reason, 8-mm adenoma removed and moderate internal hemorrhoids noted.  Hemorrhoids felt source of bleeding; had banding by Dr. Marcello Moores in 2018.  Has noticed intermittent bleeding ever since then, but worse over the past two weeks when he stopped taking his fiber and stool softeners.  No melena, hematemesis or abdominal pain.   Past Medical History:  Diagnosis Date   Dysrhythmia    ETOH abuse    Heart murmur    Hx of repair of ascending aorta 09/27/2013   Non-ischemic cardiomyopathy (HCC)    PAF (paroxysmal atrial fibrillation) ([name] Hinton)    a. Dx 07/2013, s/p TEE/DCCV.   Paroxysmal atrial flutter ([name] Hinton)    a. Dx 07/2013.   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 Bicuspid aortic valve with severe AI, dilated ascending aorta dx 07/2013.   Systolic CHF ([place])    a. Dx 07/2013: EF 25%, felt likely related to combo of EtOH + tachy-mediated + AI. LHC pending.    Past Surgical History:  Procedure Laterality Date   AORTIC VALVE REPLACEMENT  2015   (using a Carbo medics Carbo-Seal Valsalva, valve size 29 mm)   ASCENDING AORTIC ROOT REPLACEMENT N/A 09/27/2013   Procedure: ASCENDING AORTIC ROOT REPLACEMENT;  Surgeon: [name], MD;  Location: [place];  Service: Open Heart Surgery;  Laterality: N/A;   BENTALL PROCEDURE N/A  09/27/2013   Procedure: BENTALL PROCEDURE;  Surgeon: [name], MD;  Location: [place];  Service: Open Heart Surgery;  Laterality: N/A;   CARDIAC CATHETERIZATION     CARDIOVERSION N/A 08/01/2013   Procedure: CARDIOVERSION;  Surgeon: [name], MD;  Location: [place];  Service: Cardiovascular;  Laterality: N/A;   CARDIOVERSION N/A 11/15/2021   Procedure: CARDIOVERSION;  Surgeon: [name], MD;  Location: [facility] ENDOSCOPY;  Service: Cardiovascular;  Laterality: N/A;   CARDIOVERSION N/A 02/14/2022   Procedure: CARDIOVERSION;  Surgeon: [name], MD;  Location[facility] ENDOSCOPY;  Service: Cardiovascular;  Laterality: N/A;   CARDIOVERSION N/A 03/10/2022   Procedure: CARDIOVERSION;  Surgeon: [name], MD;  Location: [facility] ENDOSCOPY;  Service: Cardiovascular;  Laterality: N/A;   COLONOSCOPY WITH PROPOFOL Left 10/10/2016   Procedure: COLONOSCOPY WITH PROPOFOL;  Surgeon: Arta Silence, MD;  Location: [facility] ENDOSCOPY;  Service: Endoscopy;  Laterality: Left;   INTRAOPERATIVE TRANSESOPHAGEAL ECHOCARDIOGRAM N/A 09/27/2013   Procedure: INTRAOPERATIVE TRANSESOPHAGEAL ECHOCARDIOGRAM;  Surgeon: [name], MD;  Location: [place];  Service: Open Heart Surgery;  Laterality: N/A;   LEFT AND RIGHT HEART CATHETERIZATION WITH CORONARY ANGIOGRAM N/A 09/05/2013   Procedure: LEFT AND RIGHT HEART CATHETERIZATION WITH CORONARY ANGIOGRAM;  Surgeon: [name], MD;  Location: [facility] CATH LAB;  Service: Cardiovascular;  Laterality: N/A;   MAZE N/A 09/27/2013   Procedure: MAZE;  Surgeon: [name], MD;  Location: [place];  Service: Open Heart Surgery;  Laterality: N/A;   NO PAST SURGERIES     TEE WITHOUT  CARDIOVERSION N/A 08/01/2013   Procedure: TRANSESOPHAGEAL ECHOCARDIOGRAM (TEE);  Surgeon: [name], MD;  Location: [place];  Service: Cardiovascular;  Laterality: N/A;   TEE WITHOUT CARDIOVERSION N/A 11/15/2021   Procedure: TRANSESOPHAGEAL ECHOCARDIOGRAM (TEE);  Surgeon: [name], MD;  Location: [facility] ENDOSCOPY;  Service: Cardiovascular;  Laterality: N/A;   TEE WITHOUT CARDIOVERSION N/A 03/10/2022   Procedure: TRANSESOPHAGEAL ECHOCARDIOGRAM (TEE);  Surgeon: [name], MD;  Location: [facility] ENDOSCOPY;  Service: Cardiovascular;  Laterality: N/A;    Prior to Admission medications   Medication Sig Start Date End Date Taking? Authorizing Provider  amiodarone (PACERONE) 200 MG tablet Take 1 tablet (200 mg total) by mouth daily. 06/06/22  Yes [name], MD  aspirin EC 81 MG tablet Take 1 tablet (81 mg total) by mouth daily. 10/15/18  Yes [name], MD  carvedilol (COREG) 12.5 MG tablet TAKE 1 TABLET(12.5 MG) BY MOUTH TWICE DAILY WITH A MEAL Patient taking differently: Take 12.5 mg by mouth 2 (two) times daily with a meal. 10/17/21  Yes [name], MD  furosemide (LASIX) 20 MG tablet Take 1 tablet (20 mg total) by mouth as needed. Patient taking differently: Take 20 mg by mouth as needed for fluid or edema. 02/12/22  Yes Milford, Maricela Bo, FNP  potassium chloride SA (KLOR-CON M) 20 MEQ tablet Take 1 tablet (20 mEq total) by mouth as needed. Patient taking differently: Take 20 mEq by mouth as needed (when taking lasix). 02/12/22  Yes [place], Maricela Bo, FNP  spironolactone (ALDACTONE) 25 MG tablet TAKE 1 TABLET(25 MG) BY MOUTH DAILY Patient taking differently: Take 25 mg by mouth daily. 03/05/22  Yes [name], MD  valsartan (DIOVAN) 80 MG tablet Take 1 tablet (80 mg total) by mouth 2 (two) times daily. 11/20/21  Yes Milford, Maricela Bo, FNP  warfarin (COUMADIN) 5 MG tablet TAKE 1-1 1/2 TABLETS DAILY AS DIRECTED Patient taking differently: Take 5 mg by mouth daily. 03/17/22  Yes [name], MD    Current Facility-Administered Medications  Medication Dose Route Frequency Provider Last Rate Last Admin   0.9 %  sodium chloride infusion (Manually program via Guardrails IV Fluids)   Intravenous Once Reubin Milan, MD       0.9 %  sodium  chloride infusion   Intravenous Continuous Reubin Milan, MD       acetaminophen (TYLENOL) tablet 650 mg  650 mg Oral Q6H PRN Reubin Milan, MD       Or   acetaminophen (TYLENOL) suppository 650 mg  650 mg Rectal Q6H PRN Reubin Milan, MD       amiodarone (PACERONE) tablet 200 mg  200 mg Oral Daily Reubin Milan, MD   200 mg at 07/07/22 1144   carvedilol (COREG) tablet 12.5 mg  12.5 mg Oral BID WC Reubin Milan, MD   12.5 mg at 07/07/22 1104   irbesartan (AVAPRO) tablet 75 mg  75 mg Oral BID Reubin Milan, MD   75 mg at 07/07/22 1144   prochlorperazine (COMPAZINE) injection 10 mg  10 mg Intravenous Q6H PRN Reubin Milan, MD       spironolactone (ALDACTONE) tablet 25 mg  25 mg Oral Daily Reubin Milan, MD       Current Outpatient Medications  Medication Sig Dispense Refill   amiodarone (PACERONE) 200 MG tablet Take 1 tablet (200 mg total) by mouth daily. 90 tablet 0   aspirin EC 81 MG tablet Take  1 tablet (81 mg total) by mouth daily. 90 tablet 3   carvedilol (COREG) 12.5 MG tablet TAKE 1 TABLET(12.5 MG) BY MOUTH TWICE DAILY WITH A MEAL (Patient taking differently: Take 12.5 mg by mouth 2 (two) times daily with a meal.) 60 tablet 11   furosemide (LASIX) 20 MG tablet Take 1 tablet (20 mg total) by mouth as needed. (Patient taking differently: Take 20 mg by mouth as needed for fluid or edema.) 45 tablet 1   potassium chloride SA (KLOR-CON M) 20 MEQ tablet Take 1 tablet (20 mEq total) by mouth as needed. (Patient taking differently: Take 20 mEq by mouth as needed (when taking lasix).) 20 tablet 4   spironolactone (ALDACTONE) 25 MG tablet TAKE 1 TABLET(25 MG) BY MOUTH DAILY (Patient taking differently: Take 25 mg by mouth daily.) 90 tablet 2   valsartan (DIOVAN) 80 MG tablet Take 1 tablet (80 mg total) by mouth 2 (two) times daily. 180 tablet 3   warfarin (COUMADIN) 5 MG tablet TAKE 1-1 1/2 TABLETS DAILY AS DIRECTED (Patient taking differently: Take 5 mg  by mouth daily.) 40 tablet 3    Allergies as of 07/06/2022 - Review Complete 07/06/2022  Allergen Reaction Noted   Morphine and related Nausea Only 03/28/2016    Family History  Problem Relation Age of Onset   Heart failure Father    Coronary artery disease Father        CABG    Social History   Socioeconomic History   Marital status: Married    Spouse name: Not on file   Number of children: Not on file   Years of education: Not on file   Highest education level: Not on file  Occupational History   Occupation: Restaurant work at [employer] Use   Smoking status: Former    Packs/day: 0.50    Years: 20.00    Total pack years: 10.00    Types: Cigarettes    Quit date: 09/25/2009    Years since quitting: 12.7   Smokeless tobacco: Never  Vaping Use   Vaping Use: Never used  Substance and Sexual Activity   Alcohol use: Not Currently    Comment: [place] Every Night    Drug use: No   Sexual activity: Not on file  Other Topics Concern   Not on file  Social History Narrative   Lives with wife.   Social Determinants of Health   Financial Resource Strain: Not on file  Food Insecurity: Not on file  Transportation Needs: Not on file  Physical Activity: Not on file  Stress: Not on file  Social Connections: Not on file  Intimate Partner Violence: Not on file    Review of Systems: As per HPI, all others negative  Physical Exam: Vital signs in last 24 hours: Temp:  [97.7 F (36.5 C)-99.4 F (37.4 C)] 98.8 F (37.1 C) (12/18 1515) Pulse Rate:  [55-66] 60 (12/18 1515) Resp:  [15-23] 18 (12/18 1515) BP: (120-180)/(70-110) 132/79 (12/18 1515) SpO2:  [91 %-100 %] 99 % (12/18 1515) Weight:  [81.6 kg] 81.6 kg (12/17 1539) Last BM Date : 07/04/22 General:   Alert,  Well-developed, well-nourished, pleasant and cooperative in NAD Head:  Normocephalic and atraumatic. Eyes:  Sclera clear, no icterus.   Conjunctiva pale Ears:  Normal auditory acuity. Nose:  No  deformity, discharge,  or lesions. Mouth:  No deformity or lesions.  Oropharynx pale and dry Neck:  Supple; no masses or thyromegaly. Lungs:  No respiratory distress Abdomen:  Soft, nontender and nondistended. No masses, hepatosplenomegaly or hernias noted. Normal bowel sounds, without guarding, and without rebound.     Msk:  Symmetrical without gross deformities. Normal posture. Pulses:  Normal pulses noted. Extremities:  Without clubbing or edema. Neurologic:  Alert and  oriented x4;  grossly normal neurologically. Skin:  Intact without significant lesions or rashes. Psych:  Alert and cooperative. Normal mood and affect.   Lab Results: Recent Labs    07/06/22 1551 07/06/22 2010 07/07/22 0626  WBC 5.7 5.2  --   HGB 6.6* 6.7* 6.4*  HCT 22.1* 22.8* 21.4*  PLT 258 244  --    BMET Recent Labs    07/06/22 1621  NA 136  K 4.4  CL 102  CO2 26  GLUCOSE 109*  BUN 14  CREATININE 0.97  CALCIUM 9.3   LFT Recent Labs    07/06/22 1622  PROT 6.8  ALBUMIN 4.7  AST 14*  ALT 14  ALKPHOS 37*  BILITOT 0.6  BILIDIR 0.1  IBILI 0.5   PT/INR Recent Labs    07/06/22 1621 07/07/22 0857  LABPROT 33.7* 26.9*  INR 3.4* 2.5*    Studies/Results: No results found.  Impression:   Hematochezia, intermittent for years, worse of late, suspect hemorrhoidal. Acute blood loss anemia. Hemorrhoids, prior banding in 2018. Colon polyp, adenoma, removed 2018. Chronic anticoagulation. Mechanical aortic valve replacement.  Plan:   I think patient needs another colonoscopy, preferably as inpatient, both for investigation into anemia and bleeding, and for polyp surveillance. Probably couldn't do until at least Wednesday, pending speed of INR return to < / = 1.8 level necessary for therapeutic procedure. Warfarin on hold; heparin drip started. Patient would like to go home tomorrow; I have counseled patient against this and I asked patient to think this over. If patient insists on going  home tomorrow, would defer to cardiology and hospitalist team management of anticoagulation (Lovenox bridge instead of warfarin?) prior to expedited outpatient colonoscopy. Follow CBC, transfuse PRBCs as needed. [facility] will follow.   LOS: 0 days   Jessee Mezera M  07/07/2022, 3:34 PM  Cell [phone] If no answer or after 5 PM call [phone]

## 2022-07-07 NOTE — H&P (Signed)
History and Physical    Patient: [name] Hinton:[id number] DOB: 1971-02-14 DOA: 07/06/2022 DOS: the patient was seen and examined on 07/07/2022 PCP: Merrilee Seashore, MD  Patient coming from: Home   Chief Complaint:  Chief Complaint  Patient presents with   Abnormal Lab   HPI: [name] Hinton is a 51 y.o. 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  He stated he has not been taking his iron supplementation and fiber recently.  About 10 days ago his hemoglobin level was around 8 g/dL.  He spoke to his PCP who recommended for him to go to the emergency department.  No chest pain, recent palpitations, diaphoresis, PND, orthopnea, he occasionally gets mild pitting edema of the lower extremities. He denied fever, chills, rhinorrhea, sore throat, wheezing or hemoptysis.  No abdominal pain, nausea, emesis, diarrhea, but has not had a bowel movement in the past 2 days, although he has not been eating much.  No flank pain, dysuria, frequency or hematuria.  No polyuria, polydipsia, polyphagia or blurred vision.   ED course: Initial vital signs were temperature 97.8 F, pulse 66, respiration 18, BP 132/80 mmHg O2 sat 100% on room air.  Lab work: His CBC showed a white count of 5.7, hemoglobin 6.6 g/dL platelets 258.  PT 33.7, INR 3.4 and PTT 32.  Fecal occult blood was positive.  BMP with a glucose of 109 mg/dL, but otherwise  normal.  LFTs with an AST of 14 and alkaline phosphatase of 37 units/L.  The rest of the hepatic functions were normal.   Review of Systems: As mentioned in the history of present illness. All other systems reviewed and are negative.  Past Medical History:  Diagnosis Date   Dysrhythmia    ETOH abuse    Heart murmur    Hx of repair of ascending aorta 09/27/2013   Non-ischemic cardiomyopathy (HCC)    PAF (paroxysmal atrial fibrillation) ([place])    a. Dx 07/2013, s/p TEE/DCCV.   Paroxysmal atrial flutter ([place])    a. Dx 07/2013.   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 Bicuspid aortic valve with severe AI, dilated ascending aorta dx 07/2013.   Systolic CHF ([place])    a. Dx 07/2013: EF 25%, felt likely related to combo of EtOH + tachy-mediated + AI. LHC pending.   Past Surgical History:  Procedure Laterality Date   ASCENDING AORTIC ROOT REPLACEMENT N/A 09/27/2013   Procedure: ASCENDING AORTIC ROOT REPLACEMENT;  Surgeon: [name], MD;  Location: [place];  Service: Open Heart Surgery;  Laterality: N/A;   BENTALL PROCEDURE N/A 09/27/2013   Procedure: BENTALL PROCEDURE;  Surgeon: [name], MD;  Location: [place];  Service: Open Heart Surgery;  Laterality: N/A;  CARDIAC CATHETERIZATION     CARDIOVERSION N/A 08/01/2013   Procedure: CARDIOVERSION;  Surgeon: [name], MD;  Location: [place];  Service: Cardiovascular;  Laterality: N/A;   CARDIOVERSION N/A 11/15/2021   Procedure: CARDIOVERSION;  Surgeon: [name], MD;  Location: [facility] ENDOSCOPY;  Service: Cardiovascular;  Laterality: N/A;   CARDIOVERSION N/A 02/14/2022   Procedure: CARDIOVERSION;   Surgeon: [name], MD;  Location: [facility] ENDOSCOPY;  Service: Cardiovascular;  Laterality: N/A;   CARDIOVERSION N/A 03/10/2022   Procedure: CARDIOVERSION;  Surgeon: [name], MD;  Location: [facility] ENDOSCOPY;  Service: Cardiovascular;  Laterality: N/A;   COLONOSCOPY WITH PROPOFOL Left 10/10/2016   Procedure: COLONOSCOPY WITH PROPOFOL;  Surgeon: Arta Silence, MD;  Location: [facility] ENDOSCOPY;  Service: Endoscopy;  Laterality: Left;   INTRAOPERATIVE TRANSESOPHAGEAL ECHOCARDIOGRAM N/A 09/27/2013   Procedure: INTRAOPERATIVE TRANSESOPHAGEAL ECHOCARDIOGRAM;  Surgeon: [name], MD;  Location: [place];  Service: Open Heart Surgery;  Laterality: N/A;   LEFT AND RIGHT HEART CATHETERIZATION WITH CORONARY ANGIOGRAM N/A 09/05/2013   Procedure: LEFT AND RIGHT HEART CATHETERIZATION WITH CORONARY ANGIOGRAM;  Surgeon: [name], MD;  Location: [facility] CATH LAB;  Service: Cardiovascular;  Laterality: N/A;   MAZE N/A 09/27/2013   Procedure: MAZE;  Surgeon: [name], MD;  Location: [place];  Service: Open Heart Surgery;  Laterality: N/A;   NO PAST SURGERIES     TEE WITHOUT CARDIOVERSION N/A 08/01/2013   Procedure: TRANSESOPHAGEAL ECHOCARDIOGRAM (TEE);  Surgeon: [name], MD;  Location: [place];  Service: Cardiovascular;  Laterality: N/A;   TEE WITHOUT CARDIOVERSION N/A 11/15/2021   Procedure: TRANSESOPHAGEAL ECHOCARDIOGRAM (TEE);  Surgeon: [name], MD;  Location: [facility] ENDOSCOPY;  Service: Cardiovascular;  Laterality: N/A;   TEE WITHOUT CARDIOVERSION N/A 03/10/2022   Procedure: TRANSESOPHAGEAL ECHOCARDIOGRAM (TEE);  Surgeon: [name], MD;  Location: [facility] ENDOSCOPY;  Service: Cardiovascular;  Laterality: N/A;   Social History:  reports that he quit smoking about 12 years ago. His smoking use included cigarettes. He has a 10.00 pack-year smoking history. He has never used smokeless tobacco. He reports that he does not currently use alcohol. He reports that he does not use  drugs.  Allergies  Allergen Reactions   Morphine And Related Nausea Only    Family History  Problem Relation Age of Onset   Heart failure Father    Coronary artery disease Father        CABG    Prior to Admission medications   Medication Sig Start Date End Date Taking? Authorizing Provider  amiodarone (PACERONE) 200 MG tablet Take 1 tablet (200 mg total) by mouth daily. 06/06/22   [name], MD  aspirin EC 81 MG tablet Take 1 tablet (81 mg total) by mouth daily. 10/15/18   [name], MD  carvedilol (COREG) 12.5 MG tablet TAKE 1 TABLET(12.5 MG) BY MOUTH TWICE DAILY WITH A MEAL 10/17/21   [name], MD  Ferrous Sulfate (IRON PO) Take 130 mg by mouth daily.    [provider]  furosemide (LASIX) 20 MG tablet Take 1 tablet (20 mg total) by mouth as needed. 02/12/22   [place], Maricela Bo, FNP  potassium chloride SA (KLOR-CON M) 20 MEQ tablet Take 1 tablet (20 mEq total) by mouth as needed. 02/12/22   Rafael Bihari, FNP  spironolactone (ALDACTONE) 25 MG tablet TAKE 1 TABLET(25 MG) BY MOUTH DAILY 03/05/22   [name], MD  valsartan (DIOVAN) 80 MG tablet Take 1 tablet (80  mg total) by mouth 2 (two) times daily. 11/20/21   Rafael Bihari, FNP  warfarin (COUMADIN) 5 MG tablet TAKE 1-1 1/2 TABLETS DAILY AS DIRECTED 03/17/22   [name], MD    Physical Exam: Vitals:   07/06/22 2300 07/07/22 0317 07/07/22 0653 07/07/22 0810  BP: (!) 140/88 120/84 134/74 (!) 140/94  Pulse: 61 (!) 59 60 (!) 59  Resp: (!) 22 18 (!) 23 17  Temp: 98.7 F (37.1 C) 98.7 F (37.1 C) 98.5 F (36.9 C) 97.7 F (36.5 C)  TempSrc: Oral Oral Oral Oral  SpO2: 96% 94% 94% 97%  Weight:      Height:       Physical Exam Vitals and nursing note reviewed.  Constitutional:      General: He is awake. He is not in acute distress.    Appearance: Normal appearance.  HENT:     Head: Normocephalic.     Nose: No rhinorrhea.     Mouth/Throat:     Mouth: Mucous membranes are moist.   Eyes:     General: No scleral icterus.    Pupils: Pupils are equal, round, and reactive to light.  Cardiovascular:     Rate and Rhythm: Normal rate and regular rhythm.  Pulmonary:     Effort: Pulmonary effort is normal.     Breath sounds: Normal breath sounds.  Abdominal:     General: Bowel sounds are normal. There is no distension.     Palpations: Abdomen is soft.     Tenderness: There is no abdominal tenderness. There is no guarding.  Musculoskeletal:     Cervical back: Neck supple.     Right lower leg: No edema.     Left lower leg: No edema.  Skin:    General: Skin is warm and dry.  Neurological:     General: No focal deficit present.     Mental Status: He is alert and oriented to person, place, and time.  Psychiatric:        Mood and Affect: Mood normal.        Behavior: Behavior normal. Behavior is cooperative.     Data Reviewed:  Results are pending, will review when available.  TEE 03/10/2022 IMPRESSIONS    1. Left ventricular ejection fraction, by estimation, is 50 to 55%. The  left ventricle has low normal function. The left ventricle has no regional  wall motion abnormalities.   2. Right ventricular systolic function is normal. The right ventricular  size is normal.   3. The left atrial appendage has been ligated. Left atrial size was  mildly dilated. No left atrial/left atrial appendage thrombus was  detected.   4. Right atrial size was mildly dilated.   5. Peak RV-RA gradient 11 mmHg.   6. The mitral valve is normal in structure. Trivial mitral valve  regurgitation. No evidence of mitral stenosis.   7. Mechanical aortic valve with mean gradient 6 mmHg. Mild perivalvular  leakage. AoV function appears normal.   8. No PFO or ASD by color doppler.   9. Minimal plaque in thoracic aorta. Aortic dilatation noted. There is  mild dilatation of the ascending aorta, measuring 43 mm.   EKG yesterday Vent. rate 62 BPM PR interval 178 ms QRS duration 108  ms QT/QTcB 482/489 ms P-R-T axes 106 72 253 Normal sinus rhythm Incomplete left bundle branch block Prolonged QT interval No significant changes from previous.  Assessment and Plan: Principal Problem:   Symptomatic anemia In the setting  of:   Lower GI bleed Admit to telemetry/inpatient. Keep NPO for now. Continue IV fluids. Transfused 2 units of PRBC. Monitor H&H. Transfuse as needed. GI consult requested. Resume iron supplementation once at home. Resume fiber supplementation sent an outpatient.  Active Problems:   Chronic systolic CHF (congestive heart failure) (HCC) EF has recovered to 50 to 55%. No signs of decompensation. Continue carvedilol 12.5 mg p.o. twice daily. Continue valsartan 75 mg p.o. twice daily or formulary equivalent. Continue furosemide 20 mg p.o. daily plus KCl supplementation PRN.    AF (paroxysmal atrial fibrillation) (HCC)   S/P Bentall aortic root replacement with  mechanical valve conduit and maze procedure [id number] Score of at least 2. (History of CHF and hypertension). Continue amiodarone 200 mg p.o. daily. TSH 0.922 IU/L on 06/26/2022. Continue carvedilol as above. Warfarin has been held. May need to bridge with heparin. Cardiology has been consulted. Will await recommendations.    Prolonged QT interval Magnesium sulfate 2 g IVPB. Keep electrolytes optimized.    Essential hypertension  Continue carvedilol and valsartan as above. Monitor BP, HR, renal function and electrolytes.     Advance Care Planning:   Code Status: Full Code   Consults: [facility] and cardiology.  Family Communication:   Severity of Illness: The appropriate patient status for this patient is INPATIENT. Inpatient status is judged to be reasonable and necessary in order to provide the required intensity of service to ensure the patient's safety. The patient's presenting symptoms, physical exam findings, and initial radiographic and laboratory data in the  context of their chronic comorbidities is felt to place them at high risk for further clinical deterioration. Furthermore, it is not anticipated that the patient will be medically stable for discharge from the hospital within 2 midnights of admission.   * I certify that at the point of admission it is my clinical judgment that the patient will require inpatient hospital care spanning beyond 2 midnights from the point of admission due to high intensity of service, high risk for further deterioration and high frequency of surveillance required.*  Author: Reubin Milan, MD 07/07/2022 9:08 AM  For on call review [email or web address].   This document was prepared using Dragon voice recognition software and may contain some unintended transcription errors.

## 2022-07-07 NOTE — ED Provider Notes (Signed)
Patient has been boarding in the emergency department for 13+ hours for admission.  He is hemodynamically stable but needs a blood transfusion.  His hemoglobin this morning is 6.4.  He is on warfarin with an INR of 3.4 last night.  He needs a blood transfusion which is not available at this facility.  I have updated the hospitalist, Dr. Olevia Bowens, and I think the patient needs to go to the [facility] long ED given the boarding situation for transfusion and admission. I have made the ED aware as well.   Sherwood Gambler, MD 07/07/22 [phone]

## 2022-07-08 ENCOUNTER — Telehealth: Payer: Self-pay | Admitting: Cardiology

## 2022-07-08 DIAGNOSIS — I5022 Chronic systolic (congestive) heart failure: Secondary | ICD-10-CM | POA: Diagnosis not present

## 2022-07-08 DIAGNOSIS — I480 Paroxysmal atrial fibrillation: Secondary | ICD-10-CM | POA: Diagnosis not present

## 2022-07-08 DIAGNOSIS — K922 Gastrointestinal hemorrhage, unspecified: Secondary | ICD-10-CM | POA: Diagnosis not present

## 2022-07-08 DIAGNOSIS — D649 Anemia, unspecified: Secondary | ICD-10-CM | POA: Diagnosis not present

## 2022-07-08 LAB — BPAM RBC
Blood Product Expiration Date: 202312252359
Blood Product Expiration Date: 202312292359
ISSUE DATE / TIME: 202312181120
ISSUE DATE / TIME: 202312182044
Unit Type and Rh: 1700
Unit Type and Rh: 1700

## 2022-07-08 LAB — CBC
HCT: 27.4 % — ABNORMAL LOW (ref 39.0–52.0)
Hemoglobin: 8.2 g/dL — ABNORMAL LOW (ref 13.0–17.0)
MCH: 24.6 pg — ABNORMAL LOW (ref 26.0–34.0)
MCHC: 29.9 g/dL — ABNORMAL LOW (ref 30.0–36.0)
MCV: 82.3 fL (ref 80.0–100.0)
Platelets: 216 10*3/uL (ref 150–400)
RBC: 3.33 MIL/uL — ABNORMAL LOW (ref 4.22–5.81)
RDW: 19.3 % — ABNORMAL HIGH (ref 11.5–15.5)
WBC: 5.6 10*3/uL (ref 4.0–10.5)
nRBC: 0 % (ref 0.0–0.2)

## 2022-07-08 LAB — TYPE AND SCREEN
ABO/RH(D): B POS
Antibody Screen: NEGATIVE
Unit division: 0
Unit division: 0

## 2022-07-08 LAB — HIV ANTIBODY (ROUTINE TESTING W REFLEX): HIV Screen 4th Generation wRfx: NONREACTIVE

## 2022-07-08 LAB — PROTIME-INR
INR: 2.5 — ABNORMAL HIGH (ref 0.8–1.2)
Prothrombin Time: 26.5 seconds — ABNORMAL HIGH (ref 11.4–15.2)

## 2022-07-08 MED ORDER — WARFARIN SODIUM 5 MG PO TABS: 5.0000 mg | ORAL_TABLET | Freq: Every day | ORAL | Status: DC

## 2022-07-08 MED ORDER — WARFARIN SODIUM 5 MG PO TABS
7.5000 mg | ORAL_TABLET | Freq: Once | ORAL | Status: AC
  Administered 2022-07-08: 7.5 mg via ORAL
  Filled 2022-07-08: qty 1

## 2022-07-08 NOTE — Progress Notes (Addendum)
Rounding Note    Patient Name: [name] Hinton Date of Encounter: 07/08/2022  [place] Cardiologist: [name], MD   Subjective   No complaints wants to go home   Inpatient Medications    Scheduled Meds:  amiodarone  200 mg Oral Daily   carvedilol  12.5 mg Oral BID WC   irbesartan  75 mg Oral BID   spironolactone  25 mg Oral Daily   Continuous Infusions:  sodium chloride 75 mL/hr at 07/08/22 0835   PRN Meds: acetaminophen **OR** acetaminophen, prochlorperazine   Vital Signs    Vitals:   07/07/22 2108 07/07/22 2305 07/08/22 0551 07/08/22 0831  BP: (!) 102/56 117/73 119/74 (!) 141/92  Pulse: 62 (!) 57 (!) 54 (!) 54  Resp:  20 16   Temp: 98 F (36.7 C) 98.4 F (36.9 C) 97.9 F (36.6 C)   TempSrc: Oral Oral Oral   SpO2: 97% 96% 99%   Weight:      Height:        Intake/Output Summary (Last 24 hours) at 07/08/2022 1035 Last data filed at 07/08/2022 1026 Gross per 24 hour  Intake 1362.5 ml  Output 1000 ml  Net 362.5 ml      07/06/2022    3:39 PM 06/26/2022    1:56 PM 04/01/2022    8:41 AM  Last 3 Weights  Weight (lbs) 180 lb 184 lb 6.4 oz 183 lb 3.2 oz  Weight (kg) 81.647 kg 83.643 kg 83.099 kg      Telemetry    SB - Personally Reviewed  ECG    No new - Personally Reviewed  Physical Exam   GEN: No acute distress.   Neck: No JVD Cardiac: RRR, no murmurs, rubs, or gallops. Crisp closure of mechanical valve Respiratory: Clear to auscultation bilaterally. GI: Soft, nontender, non-distended  MS: No edema; No deformity. Neuro:  Nonfocal  Psych: Normal affect   Labs    High Sensitivity Troponin:  No results for input(s): "TROPONINIHS" in the last 720 hours.   Chemistry Recent Labs  Lab 07/06/22 1621 07/06/22 1622  NA 136  --   K 4.4  --   CL 102  --   CO2 26  --   GLUCOSE 109*  --   BUN 14  --   CREATININE 0.97  --   CALCIUM 9.3  --   PROT  --  6.8  ALBUMIN  --  4.7  AST  --  14*  ALT  --  14  ALKPHOS  --  37*   BILITOT  --  0.6  GFRNONAA >60  --   ANIONGAP 8  --     Lipids No results for input(s): "CHOL", "TRIG", "HDL", "LABVLDL", "LDLCALC", "CHOLHDL" in the last 168 hours.  Hematology Recent Labs  Lab 07/06/22 1551 07/06/22 2010 07/07/22 0626 07/07/22 1844 07/08/22 0742  WBC 5.7 5.2  --   --  5.6  RBC 2.71* 2.84*  --   --  3.33*  HGB 6.6* 6.7* 6.4* 8.0* 8.2*  HCT 22.1* 22.8* 21.4* 27.2* 27.4*  MCV 81.5 80.3  --   --  82.3  MCH 24.4* 23.6*  --   --  24.6*  MCHC 29.9* 29.4*  --   --  29.9*  RDW 19.6* 19.7*  --   --  19.3*  PLT 258 244  --   --  216   Thyroid No results for input(s): "TSH", "FREET4" in the last 168 hours.  BNPNo results for  input(s): "BNP", "PROBNP" in the last 168 hours.  DDimer No results for input(s): "DDIMER" in the last 168 hours.   Radiology    No results found.  Cardiac Studies   Echo (9/17) with EF 55-60%, mechanical aortic valve with mean gradient 13 mmHg.  - Echo (11/19) with EF 55% with mild LVH, normal RV size with mildly decreased systolic function, normal mechanical aortic valve.  - Echo (10/21): EF 50-55%, normal RV, moderate biatrial enlargement, mechanical aortic valve appeared to function normally. - TEE (4/23): EF 50-55%, no regional WMAs, normal RV, no LA thrombus, no PFO/ASD, mechanical valve without significant regurgitation, mean gradient 7 mmHg. - TEE (8/23): EF 50-55%, normal RV, no LA thrombus, mechanical Ao valve mean gradient 6 mmgHg   LHC/RHC (2/15): no coronary disease, mean RA 5, PA 62/25, mean PCWP 22, CI 1.8, PVR 5.2 WU, LVEDP 35 (severe AI).     Patient Profile     50 y.o. male hx of NICM, dx in 47 with severe aortic insuff, atrial flutter to atrial fib.  No coronary disease on cath, and in March 2015 he had surgery with replacement of ascending aorta and aortic valve with Bentall procedure with mechanical aortic valve (using a Carbo medics Carbo-Seal Valsalva, valve size 29 mm) .  Also with Maze procedure.  EF by 2017 improved  to 55-60% now admitted  GI bleed, with plan for colonoscopy maybe Wed.   Assessment & Plan    GI bleed with acute anemia  --rec'd 2 units PRBCs yesterday  --GI following --coumadin on hold -- add Heparin once INR <2.5  vs discharge and lovenox crossover with procedure in Jan.  Will defer to Dr. Johney Frame  s/p Bentall aortic root replacement with mechanical valve (using a Car[employer] medics Carbo-Seal Valsalva, valve size 29 mm)  and maze procedure  -on coumadin, holding now --TEE 03/10/22 Aortic valve peak gradient measures 8.9 mmHg. Aortic valve area, by VTI measures 3.91 cm.   PAF  --on amiodarone 200 dialy with plan for ablation with Dr. Curt Bears 08/13/22.  Maintaining SR   Chronic systolic CHF with  hx NICM EF in 2015 was 25% but with valve replacement and medical therapy most recent EF on TEE 50-55% 03/10/22 on diovan, spironolactone coreg 12.5 BID, asa   OSA awaiting CPAP      For questions or updates, please contact [facility] Please consult [email or web address] for contact info under        Signed, Cecilie Kicks, NP  07/08/2022, 10:35 AM    Patient seen and examined and agree with Cecilie Kicks, NP as detailed above.   In brief, the patient is a 51 y.o. male with a hx of NICM with improved EF (25%>>50-55%), bicuspid aortic valve with severe aortic insufficinecy s/p replacement of ascending aorta and aortic valve with Bentall procedure with mechanical aortic valve (using a Car[employer] medics Carbo-Seal Valsalva, valve size 29 mm) and MAZE procedure in 2015 on warfarin for [facility] who presented with acute GIB. Cardiology is consulted for management of AC in the setting of acute bleed.   Patient with known history of bicuspid aortic valve with severe AR s/p Bentall with mechanical AVR and MAZE in 2015. Also with history of NICM with EF 25% that improved to 50-55% on TEE in 09/2021. Cath in 2015 with clean coronaries. He has been maintained on warfarin for his mechanical AVR and Afib with goal  INR 2.5-3.5. Has history of GIB in the past thought to be hemorrhoidal at that time.  He presents on this admission with rectal bleeding with hemoglobin 6.6. INR 3.4>2.5. Otherwise HD stable. He received 2units of pRBC and his warfarin was held. HgB 8 today. Seen by GI who recommended colonoscopy, however, patient would like to go home and pursue outpatient colonoscopy. Discussed he is high risk for discharge but he wishes to proceed. Will resume warfarin at 7.'5mg'$  today and then back to his '5mg'$  daily tomorrow. Will arrange for follow-up in [facility] clinic tomorrow to further adjust his warfarin as well as come up with a bridging plan prior to his colonoscopy on 07/23/22.      GEN: No acute distress.   Neck: No JVD Cardiac: RRR, 2/6 systolic murmur, +mechanical click  Respiratory: Clear to auscultation bilaterally. GI: Soft, nontender, non-distended  MS: No edema; No deformity. Neuro:  Nonfocal  Psych: Normal affect     Plan: -Patient declined staying inpatient for colonoscopy -Will give warfarin 7.'5mg'$  once today and then resume his warfarin '5mg'$  daily tomorrow -Will arrange for outpatient follow-up in [facility] clinic for further adjustment of warfarin as well as to discuss bridging plan prior to outpatient colonoscopy on 07/23/22 -Continue home coreg 12.'5mg'$  daily, irbesartan '75mg'$  BID and spiro '25mg'$  daily -Continue home amiodarone '200mg'$  daily   Gwyndolyn Kaufman, MD

## 2022-07-08 NOTE — Hospital Course (Addendum)
51 y.o. 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  He stated he has not been taking his iron supplementation and fiber recently.  About 10 days ago his hemoglobin level was around 8 g/dL.  He spoke to his PCP who recommended for him to go to the emergency department.  ED course: Initial vital signs were temperature 97.8 F, pulse 66, respiration 18, BP 132/80 mmHg O2 sat 100% on room air. Lab work: His CBC showed a white count of 5.7, hemoglobin 6.6 g/dL platelets 258.  PT 33.7, INR 3.4 and PTT 32.  Fecal occult blood was positive.  BMP with a glucose of 109 mg/dL, but otherwise normal.  LFTs with an AST of 14 and alkaline phosphatase of 37 units/L.  The rest of the hepatic functions were normal. Patient was admitted, GI and cardiology was consulted Plan is to get a colonoscopy with holding of warfarin and bridge once subtherapeutic with heparin. Patient requesting for discharge and he will come outpatient for follow-up.  His hemoglobin has improved after 2 units PRBC total. At this time since patient is adamant on going home currently discussed with him double dose with Coumadin 7.5 g today and follow-up with the Coumadin clinic tomorrow and arrange his outpatient anticoagulation for bridge prior to colonoscopy, which will be done on 07/23/2021 outpatient

## 2022-07-08 NOTE — Discharge Instructions (Signed)
Out coumadin clinic that you will see tomorrow will arrange for lovenox crossover for colonoscopy in Jan.

## 2022-07-08 NOTE — Telephone Encounter (Signed)
error 

## 2022-07-08 NOTE — Discharge Summary (Signed)
Physician Discharge Summary  [name] Hinton:[id number] DOB: Dec 02, 1970 DOA: 07/06/2022  PCP: Merrilee Seashore, MD  Admit date: 07/06/2022 Discharge date: 07/08/2022 Recommendations for Outpatient Follow-up:  Follow up with PCP in 1 weeks-call for appointment Please obtain BMP/CBC in one week  Discharge Dispo: home Discharge Condition: Stable Code Status:   Code Status: Full Code Diet recommendation:  Diet Order             DIET SOFT Room service appropriate? Yes; Fluid consistency: Thin  Diet effective now                   Brief/Interim Summary:51 y.o. 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  He stated he has not been taking his iron supplementation and fiber recently.  About 10 days ago his hemoglobin level was around 8 g/dL.  He spoke to his PCP who recommended for him to go to the emergency department.  ED course: Initial vital signs were temperature 97.8 F, pulse 66, respiration 18, BP 132/80 mmHg O2 sat 100% on room air. Lab work: His CBC showed a white count of 5.7, hemoglobin 6.6 g/dL platelets 258.  PT 33.7, INR 3.4 and PTT 32.  Fecal occult blood was positive.  BMP with a glucose of 109 mg/dL, but otherwise normal.  LFTs with an AST of 14 and alkaline phosphatase of 37 units/L.  The rest of the hepatic functions were normal. Patient was admitted, GI and cardiology was consulted Plan is to get a  colonoscopy with holding of warfarin and bridge once subtherapeutic with heparin. Patient requesting for discharge and he will come outpatient for follow-up.  His hemoglobin has improved after 2 units PRBC total. 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  But will be done as outpatient since patient is adamant on going home.Hemoglobin improved 8.2 g S/P unit PRBC 12/18.  PAF S/P Bentall aortic root replacement with mechanical valve and Maze procedure: Maintaining sinus rhythm.  Continue amiodarone, plan is for ablation January of 24, cardio plans to fu as op and adjust his anticoagulation and bracing.last echo August/21/23. Get Coumadin 7.5 mg once today and resume 5 mg daily from tomorrow and follow-up with outpatient clinic for further adjustment of warfarin as well as to discuss bridge plan prior to outpatient colonoscopy.  Essential  hypertension BP stable monitor Prolonged QTc on amiodarone monitor electrolytes Chronic systolic CHF with history of nonischemic cardiomyopathy EF 25% in 2015 improved to 50-50% in August. Cont Diovan Aldactone Coreg aspirin per cardiology OSA awaiting CPAP Patient is requesting  for discharge home and would like to return for outpatient colonoscopy.  GI bleed with the plan.  Cardiology will manage as outpatient anticoagulation.  Strongly advised to stay and obtain colonoscopy inpatient.  Consults: Cardio, GI. Subjective:   Discharge Exam: Vitals:   07/08/22 0551 07/08/22 0831  BP: 119/74 (!) 141/92  Pulse: (!) 54 (!) 54  Resp: 16   Temp: 97.9 F (36.6 C)   SpO2: 99%    General: Pt is alert, awake, not in acute distress Cardiovascular: RRR, S1/S2 +, no rubs, no gallops Respiratory: CTA bilaterally, no wheezing, no rhonchi Abdominal: Soft, NT, ND, bowel sounds + Extremities: no edema, no cyanosis  Discharge Instructions  Discharge Instructions     Discharge instructions   Complete by: As directed    Please follow-up with the cardiology office/[facility] tomorrow for further dosing of Coumadin and for Lovenox bridging for colonoscopy, which will be done Dr. Paulita Fujita in January/3/23  Please call call MD or return to ER for similar or worsening recurring problem that brought you to hospital or if any fever,nausea/vomiting,abdominal pain, uncontrolled pain, chest pain,  shortness of breath or any other alarming symptoms.  Please follow-up your doctor as instructed in a week time and call the office for appointment.  Please avoid alcohol, smoking, or any other illicit substance and maintain healthy habits including taking your regular medications as prescribed.  You were cared for by a hospitalist during your hospital stay. If you have any questions about your discharge medications or the care you received while you were in the hospital after you are discharged, you can call the unit and ask to speak with the hospitalist on call if the hospitalist that took care of you is not available.  Once you are discharged, your primary care physician will handle any further medical issues. Please note that NO REFILLS for any discharge medications will be  authorized once you are discharged, as it is imperative that you return to your primary care physician (or establish a relationship with a primary care physician if you do not have one) for your aftercare needs so that they can reassess your need for medications and monitor your lab values   Increase activity slowly   Complete by: As directed       Allergies as of 07/08/2022       Reactions   Morphine And Related Nausea Only        Medication List     TAKE these medications    amiodarone 200 MG tablet Commonly known as: PACERONE Take 1 tablet (200 mg total) by mouth daily.   aspirin EC 81 MG tablet Take 1 tablet (81 mg total) by mouth daily.   carvedilol 12.5 MG tablet Commonly known as: COREG TAKE 1 TABLET(12.5 MG) BY MOUTH TWICE DAILY WITH A MEAL What changed:  how much to take how to take this when to take this additional instructions   furosemide 20 MG tablet Commonly known as: LASIX Take 1 tablet (20 mg total) by mouth as needed. What changed: reasons to take this   potassium chloride SA 20 MEQ tablet Commonly known as: KLOR-CON M Take 1 tablet (20 mEq total) by mouth as needed. What changed: reasons to take this   spironolactone 25  MG tablet Commonly known as: ALDACTONE TAKE 1 TABLET(25 MG) BY MOUTH DAILY What changed:  how much to take how to take this when to take this additional instructions   valsartan 80 MG tablet Commonly known as: Diovan Take 1 tablet (80 mg total) by mouth 2 (two) times daily.   warfarin 5 MG tablet Commonly known as: COUMADIN Take as directed. If you are unsure how to take this medication, talk to your nurse or doctor. Original instructions: Take 1 tablet (5 mg total) by mouth daily. What changed: See the new instructions.        Follow-up Information     Arta Silence, MD Follow up.   Specialty: Gastroenterology Why: Call the office for colonoscopy which is being scheduled for 07/23/2022 Contact  information: [address] [place] [address] [phone]         Merrilee Seashore, MD Follow up in 1 week(s).   Specialty: Internal Medicine Why: Check CBC BMP Contact information: [place] [address] [phone]         [facility] Follow up on 07/09/2022.   Specialty: Cardiology Why: coumadin clinic Contact information: [address] [place] [address] [phone]               Allergies  Allergen Reactions   Morphine And Related Nausea Only    The results of significant diagnostics from this hospitalization (including imaging, microbiology, ancillary and laboratory) are listed below for reference.    Microbiology: No results found for this or any previous visit (from the past 240 hour(s)).  Procedures/Studies: No results found.  Labs: BNP (last 3 results) Recent Labs    02/12/22 1043  BNP 631.4*   Basic Metabolic Panel: Recent Labs  Lab 07/06/22 1621  NA 136  K 4.4  CL 102  CO2 26  GLUCOSE 109*  BUN 14  CREATININE 0.97  CALCIUM 9.3   Liver Function Tests: Recent Labs  Lab 07/06/22 1622  AST 14*  ALT 14  ALKPHOS 37*  BILITOT 0.6  PROT 6.8  ALBUMIN 4.7   No results for input(s): "LIPASE", "AMYLASE" in the last 168 hours. No results for input(s): "AMMONIA" in the last 168 hours. CBC: Recent Labs  Lab 07/06/22 1551 07/06/22 2010 07/07/22 0626 07/07/22 1844 07/08/22 0742  WBC 5.7 5.2  --   --  5.6  NEUTROABS  --  3.6  --   --   --   HGB 6.6* 6.7* 6.4* 8.0* 8.2*  HCT 22.1* 22.8* 21.4* 27.2* 27.4*  MCV 81.5 80.3  --   --  82.3  PLT 258 244  --   --  216   Cardiac Enzymes: No results for input(s): "CKTOTAL", "CKMB", "CKMBINDEX", "TROPONINI" in the last 168 hours. BNP: Invalid input(s): "POCBNP" CBG: No results for input(s): "GLUCAP" in the last 168 hours. D-Dimer No results for input(s): "DDIMER" in the last 72 hours. Hgb  A1c No results for input(s): "HGBA1C" in the last 72 hours. Lipid Profile No results for input(s): "CHOL", "HDL", "LDLCALC", "TRIG", "CHOLHDL", "LDLDIRECT" in the last 72 hours. Thyroid function studies No results for input(s): "TSH", "T4TOTAL", "T3FREE", "THYROIDAB" in the last 72 hours.  Invalid input(s): "FREET3" Anemia work up No results for input(s): "VITAMINB12", "FOLATE", "FERRITIN", "TIBC", "IRON", "RETICCTPCT" in the last 72 hours. Urinalysis    Component Value Date/Time   COLORURINE YELLOW 09/26/2013 [place] 09/26/2013 0838   LABSPEC 1.019 09/26/2013 9702  PHURINE 5.0 09/26/2013 0838   GLUCOSEU NEGATIVE 09/26/2013 0838   HGBUR NEGATIVE 09/26/2013 0838   BILIRUBINUR NEGATIVE 09/26/2013 0838   KETONESUR NEGATIVE 09/26/2013 0838   PROTEINUR NEGATIVE 09/26/2013 0838   UROBILINOGEN 0.2 09/26/2013 0838   NITRITE NEGATIVE 09/26/2013 0838   LEUKOCYTESUR NEGATIVE 09/26/2013 0838   Sepsis Labs Recent Labs  Lab 07/06/22 1551 07/06/22 2010 07/08/22 0742  WBC 5.7 5.2 5.6   Microbiology No results found for this or any previous visit (from the past 240 hour(s)).   Time coordinating discharge: 25 minutes  SIGNED: Antonieta Pert, MD  [facility] 07/08/2022, 1:13 PM  If 7PM-7AM, please contact night-coverage [email or web address]

## 2022-07-08 NOTE — Progress Notes (Signed)
Subjective: No further bleeding other than scant blood on tissue paper (which he's had > 5 years). No abdominal pain.  Objective: Vital signs in last 24 hours: Temp:  [97.4 F (36.3 C)-99.4 F (37.4 C)] 97.9 F (36.6 C) (12/19 0551) Pulse Rate:  [54-63] 54 (12/19 0831) Resp:  [16-22] 16 (12/19 0551) BP: (102-141)/(56-92) 141/92 (12/19 0831) SpO2:  [96 %-99 %] 99 % (12/19 0551) Weight change:  Last BM Date : 07/08/22  PE: GEN:  NAD ABD:  Soft, non-tender  Lab Results: CBC    Component Value Date/Time   WBC 5.6 07/08/2022 0742   RBC 3.33 (L) 07/08/2022 0742   HGB 8.2 (L) 07/08/2022 0742   HCT 27.4 (L) 07/08/2022 0742   PLT 216 07/08/2022 0742   MCV 82.3 07/08/2022 0742   MCH 24.6 (L) 07/08/2022 0742   MCHC 29.9 (L) 07/08/2022 0742   RDW 19.3 (H) 07/08/2022 0742   LYMPHSABS 0.8 07/06/2022 2010   MONOABS 0.6 07/06/2022 2010   EOSABS 0.1 07/06/2022 2010   BASOSABS 0.1 07/06/2022 2010  CMP     Component Value Date/Time   NA 136 07/06/2022 1621   K 4.4 07/06/2022 1621   CL 102 07/06/2022 1621   CO2 26 07/06/2022 1621   GLUCOSE 109 (H) 07/06/2022 1621   BUN 14 07/06/2022 1621   CREATININE 0.97 07/06/2022 1621   CALCIUM 9.3 07/06/2022 1621   PROT 6.8 07/06/2022 1622   ALBUMIN 4.7 07/06/2022 1622   AST 14 (L) 07/06/2022 1622   ALT 14 07/06/2022 1622   ALKPHOS 37 (L) 07/06/2022 1622   BILITOT 0.6 07/06/2022 1622   GFRNONAA >60 07/06/2022 1621   GFRAA >60 02/07/2020 1457   Assessment:   Hematochezia, intermittent for years, worse of late, suspect hemorrhoidal. Acute blood loss anemia. Hemorrhoids, prior banding in 2018. Colon polyp, adenoma, removed 2018. Chronic anticoagulation. Mechanical aortic valve replacement.  Plan:   Patient would like to go home today if possible.   I will defer patient's candidacy for discharge home to cardiology team.  If patient is discharged home, we would plan expedited colonoscopy in a couple weeks (warfarin --> Lovenox  management per cardiology).  If patient is not discharged home, we could plan on colonoscopy likely Thursday (today's INR 2.5, not likely to be in appropriate range for colonoscopy by tomorrow). [facility] will follow.   [name] 07/08/2022, 12:54 PM   Cell [phone] If no answer or after 5 PM call [phone]

## 2022-07-09 ENCOUNTER — Other Ambulatory Visit: Payer: Self-pay | Admitting: Gastroenterology

## 2022-07-09 ENCOUNTER — Ambulatory Visit (INDEPENDENT_AMBULATORY_CARE_PROVIDER_SITE_OTHER): Payer: BC Managed Care – PPO

## 2022-07-09 DIAGNOSIS — I480 Paroxysmal atrial fibrillation: Secondary | ICD-10-CM | POA: Diagnosis not present

## 2022-07-09 DIAGNOSIS — Z8679 Personal history of other diseases of the circulatory system: Secondary | ICD-10-CM | POA: Diagnosis not present

## 2022-07-09 DIAGNOSIS — Z9889 Other specified postprocedural states: Secondary | ICD-10-CM | POA: Diagnosis not present

## 2022-07-09 DIAGNOSIS — I359 Nonrheumatic aortic valve disorder, unspecified: Secondary | ICD-10-CM | POA: Diagnosis not present

## 2022-07-09 DIAGNOSIS — Z954 Presence of other heart-valve replacement: Secondary | ICD-10-CM

## 2022-07-09 DIAGNOSIS — Z5181 Encounter for therapeutic drug level monitoring: Secondary | ICD-10-CM

## 2022-07-09 DIAGNOSIS — I351 Nonrheumatic aortic (valve) insufficiency: Secondary | ICD-10-CM | POA: Diagnosis not present

## 2022-07-09 LAB — HEMOGLOBIN AND HEMATOCRIT, BLOOD
HCT: 21.4 % — ABNORMAL LOW (ref 39.0–52.0)
Hemoglobin: 6.4 g/dL — CL (ref 13.0–17.0)

## 2022-07-09 LAB — POCT INR: INR: 2.8 (ref 2.0–3.0)

## 2022-07-09 MED ORDER — ENOXAPARIN SODIUM 80 MG/0.8ML IJ SOSY: 80.0000 mg | PREFILLED_SYRINGE | Freq: Two times a day (BID) | INTRAMUSCULAR | 1 refills | Status: DC

## 2022-07-09 NOTE — Patient Instructions (Addendum)
Description   Continue taking Warfarin 1 tablet daily. On 12/29 follow pre-procedure instructions. Hold Warfarin on 12/29, 12/30, 12/31, 1/1, and 1/2.  Stay consistent with greens (3-4 times per week)  Recheck INR in 1 week post procedure.  Coumadin Clinic [phone], call with any changes in medication or up coming procedure.  *Amio '200mg'$  daily*      07/17/22: Last dose of warfarin.  07/18/22: No warfarin or enoxaparin (Lovenox).  07/19/22: Inject enoxaparin '80mg'$  in the fatty abdominal tissue at least 2 inches from the belly button twice a day about 12 hours apart, 8am and 8pm rotate sites. No warfarin.  07/20/22: Inject enoxaparin in the fatty tissue every 12 hours, 8am and 8pm. No warfarin.  07/21/22: Inject enoxaparin in the fatty tissue every 12 hours, 8am and 8pm. No warfarin.  07/22/22: Inject enoxaparin in the fatty tissue in the morning at 8 am (No PM dose). No warfarin.  07/23/22: Procedure Day - No enoxaparin - Resume warfarin in the evening or as directed by doctor (take an extra half tablet with usual dose for 2 days then resume normal dose).  07/24/22: Resume enoxaparin inject in the fatty tissue every 12 hours and take warfarin  07/25/22: Inject enoxaparin in the fatty tissue every 12 hours and take warfarin  07/26/22: Inject enoxaparin in the fatty tissue every 12 hours and take warfarin  07/27/22: Inject enoxaparin in the fatty tissue every 12 hours and take warfarin  07/28/22: Inject enoxaparin in the fatty tissue every 12 hours and take warfarin  07/29/22: warfarin appt to check INR.

## 2022-07-10 ENCOUNTER — Telehealth: Payer: Self-pay | Admitting: *Deleted

## 2022-07-10 NOTE — Telephone Encounter (Signed)
   Pre-operative Risk Assessment    Patient Name: [name] Hinton  DOB: Jul 20, 1971 MRN: [id number]      Request for Surgical Clearance    Procedure:   COLONOSCOPY   Date of Surgery:  Clearance 07/25/22                                 Surgeon:  DR. Paulita Fujita Surgeon's Group or Practice Name:  [facility] Phone number:  [phone] Fax number:  [phone]   Type of Clearance Requested:   - Pharmacy:  Hold Warfarin (Coumadin) NOT INDICATED   Type of Anesthesia:   PROPOFOL   Additional requests/questions:   ASKING IF OK TO [place]  Signed, Jeanann Lewandowsky   07/10/2022, 7:02 AM

## 2022-07-10 NOTE — Telephone Encounter (Signed)
Patient with mechanical AVR and afib on warfarin for anticoagulation. He requires bridging with Lovenox in order to hold his warfarin. Not sure why request states 1/5 as procedure date, colonoscopy in epic is scheduled on 1/3. He is also scheduled for an ablation exactly 3 weeks later on 1/24. Pt requires 3 weeks of therapeutic/uninterrupted anticoagulation prior to ablation. Lovenox is usually not restarted until the day after colonoscopy, in which case pt would be a day short on anticoag before his ablation.   Will forward to Dr Curt Bears to see if he is ok with pt having a day short of 3 weeks of therapeutic anticoag prior to upcoming ablation. If not, colonoscopy would need to be moved up at least 1 day.   Pt will also require weekly INR checks leading up to ablation on 1/24. He is followed at [facility] Coumadin clinic - will forward to them as well to coordinate Lovenox bridging for upcoming colonoscopy as well as weekly INR checks after for ablation.

## 2022-07-11 NOTE — Telephone Encounter (Signed)
     Primary Cardiologist: [name], MD  Chart reviewed as part of pre-operative protocol coverage. Given past medical history and time since last visit, based on ACC/AHA guidelines, [name] Hinton would be at acceptable risk for the planned procedure without further cardiovascular testing.   Patient with mechanical AVR and afib on warfarin for anticoagulation.  Pt will  require weekly INR checks leading up to ablation on 1/24. He is followed at [facility] Coumadin clinic - will forward to them as well to coordinate Lovenox bridging for upcoming colonoscopy as well as weekly INR checks after for ablation.   Per Dr. Curt Bears-  "CT planned prior to ablation to rule out thrombus. Ok to hold warfarin prior to colonoscopy. "  I will route this recommendation to the requesting party via Epic fax function and remove from pre-op pool.  Please call with questions.  [name]. [name] NP-C     07/11/2022, 3:22 PM [facility] [place] [address] Office [phone] Fax [phone]

## 2022-07-11 NOTE — Progress Notes (Signed)
Attempted to obtain medical history. Unable to reach pt. At this time. HIPAA complaint voicemail left with pre-surgical testing number. 

## 2022-07-15 ENCOUNTER — Telehealth: Payer: Self-pay | Admitting: *Deleted

## 2022-07-15 NOTE — Telephone Encounter (Signed)
-----   Message from Lauralee Evener, [place] sent at 06/19/2022  9:21 AM EST -----  ----- Message ----- From: Sueanne Margarita, MD Sent: 06/15/2022  12:33 AM EST To: Cv Div Sleep Studies  Please let patient know that they have sleep apnea.  Recommend therapeutic CPAP titration for treatment of patient's sleep disordered breathing.  If unable to perform an in lab titration then initiate ResMed auto CPAP from 4 to 15cm H2O with heated humidity and mask of choice and overnight pulse ox on CPAP.

## 2022-07-15 NOTE — Telephone Encounter (Signed)
The patient has been notified of the result. Left detailed message on voicemail and informed patient to call back..[name] Hinton, CMA   

## 2022-07-20 ENCOUNTER — Emergency Department (HOSPITAL_BASED_OUTPATIENT_CLINIC_OR_DEPARTMENT_OTHER): Payer: BC Managed Care – PPO

## 2022-07-20 ENCOUNTER — Emergency Department (HOSPITAL_BASED_OUTPATIENT_CLINIC_OR_DEPARTMENT_OTHER)
Admission: EM | Admit: 2022-07-20 | Discharge: 2022-07-20 | Disposition: A | Discharge status: Home / Self Care | Payer: BC Managed Care – PPO | Attending: Emergency Medicine | Admitting: Emergency Medicine

## 2022-07-20 ENCOUNTER — Encounter (HOSPITAL_BASED_OUTPATIENT_CLINIC_OR_DEPARTMENT_OTHER): Payer: Self-pay

## 2022-07-20 ENCOUNTER — Other Ambulatory Visit: Payer: Self-pay

## 2022-07-20 DIAGNOSIS — Z7982 Long term (current) use of aspirin: Secondary | ICD-10-CM | POA: Diagnosis not present

## 2022-07-20 DIAGNOSIS — R69 Illness, unspecified: Secondary | ICD-10-CM

## 2022-07-20 DIAGNOSIS — J101 Influenza due to other identified influenza virus with other respiratory manifestations: Secondary | ICD-10-CM | POA: Insufficient documentation

## 2022-07-20 DIAGNOSIS — Z7901 Long term (current) use of anticoagulants: Secondary | ICD-10-CM | POA: Insufficient documentation

## 2022-07-20 DIAGNOSIS — Z20822 Contact with and (suspected) exposure to covid-19: Secondary | ICD-10-CM | POA: Diagnosis not present

## 2022-07-20 DIAGNOSIS — R059 Cough, unspecified: Secondary | ICD-10-CM | POA: Diagnosis present

## 2022-07-20 LAB — RESP PANEL BY RT-PCR (RSV, FLU A&B, COVID)  RVPGX2
Influenza A by PCR: NEGATIVE
Influenza B by PCR: POSITIVE — AB
Resp Syncytial Virus by PCR: NEGATIVE
SARS Coronavirus 2 by RT PCR: NEGATIVE

## 2022-07-20 LAB — GROUP A STREP BY PCR: Group A Strep by PCR: NOT DETECTED

## 2022-07-20 MED ORDER — HYDROCODONE BIT-HOMATROP MBR 5-1.5 MG/5ML PO SOLN: 5.0000 mL | Freq: Four times a day (QID) | ORAL | 0 refills | Status: DC | PRN

## 2022-07-20 MED ORDER — BENZONATATE 100 MG PO CAPS: 100.0000 mg | ORAL_CAPSULE | Freq: Three times a day (TID) | ORAL | 0 refills | Status: DC

## 2022-07-20 MED ORDER — ENOXAPARIN SODIUM 80 MG/0.8ML IJ SOSY
80.0000 mg | PREFILLED_SYRINGE | Freq: Once | INTRAMUSCULAR | Status: AC
  Administered 2022-07-20: 80 mg via SUBCUTANEOUS
  Filled 2022-07-20: qty 0.8

## 2022-07-20 MED ORDER — ACETAMINOPHEN 500 MG PO TABS
1000.0000 mg | ORAL_TABLET | Freq: Once | ORAL | Status: AC
  Administered 2022-07-20: 1000 mg via ORAL
  Filled 2022-07-20: qty 2

## 2022-07-20 NOTE — Discharge Instructions (Addendum)
1.  Take extra strength Tylenol for fever and bodyaches. 2. You may take Tessalon Perles for cough.  If you have a significant amount of cough need additional cough medication, you may take a teaspoon of hydrocodone syrup every 4-6 hours.  Due to your other medical problems you need to avoid stimulant type of decongestants. 3.  Return to the emergency department if you develop shortness of breath, chest pain or other concerning symptoms.  Try to follow-up with your family doctor for recheck as soon as possible.

## 2022-07-20 NOTE — ED Provider Notes (Signed)
[facility] EMERGENCY DEPT Provider Note   CSN: [id number] Arrival date & time: 07/20/22  1558     History  Chief Complaint  Patient presents with   Cough    [name] Hinton is a 51 y.o. male.  HPI Patient presents with upper respiratory symptoms of congestion.  He has a cough that started some 3 days ago.  He has had chills but no documented fevers.  Patient has known history of atrial fibrillation, nonischemic cardiomyopathy and history of CHF.  History, he was concerned for worsening symptoms of achiness and chills.  He has been trying some over-the-counter medications but not feeling improved.  Patient is currently on enoxaparin for anticoagulant due to an upcoming scheduled endoscopy.  Reports he has been compliant with this.    Home Medications Prior to Admission medications   Medication Sig Start Date End Date Taking? Authorizing Provider  amiodarone (PACERONE) 200 MG tablet Take 1 tablet (200 mg total) by mouth daily. 06/06/22   [name], MD  aspirin EC 81 MG tablet Take 1 tablet (81 mg total) by mouth daily. 10/15/18   [name], MD  benzonatate (TESSALON) 100 MG capsule Take 1 capsule (100 mg total) by mouth every 8 (eight) hours. 07/20/22   Charlesetta Shanks, MD  carvedilol (COREG) 12.5 MG tablet TAKE 1 TABLET(12.5 MG) BY MOUTH TWICE DAILY WITH A MEAL 10/17/21   [name], MD  enoxaparin (LOVENOX) 80 MG/0.8ML injection Inject 0.8 mLs (80 mg total) into the skin every 12 (twelve) hours. 07/09/22   Camnitz, Ocie Doyne, MD  furosemide (LASIX) 20 MG tablet Take 1 tablet (20 mg total) by mouth as needed. 02/12/22   [place]ford, Maricela Bo, FNP  HYDROcodone bit-homatropine (HYCODAN) 5-1.5 MG/5ML syrup Take 5 mLs by mouth every 6 (six) hours as needed for cough. 07/20/22   Charlesetta Shanks, MD  potassium chloride SA (KLOR-CON M) 20 MEQ tablet Take 1 tablet (20 mEq total) by mouth as needed. 02/12/22   Rafael Bihari, FNP  spironolactone (ALDACTONE) 25  MG tablet TAKE 1 TABLET(25 MG) BY MOUTH DAILY 03/05/22   [name], MD  valsartan (DIOVAN) 80 MG tablet Take 1 tablet (80 mg total) by mouth 2 (two) times daily. 11/20/21   [place], Maricela Bo, FNP  warfarin (COUMADIN) 5 MG tablet Take 1 tablet (5 mg total) by mouth daily. 07/08/22   Antonieta Pert, MD      Allergies    Morphine and related    Review of Systems   Review of Systems  Physical Exam Updated Vital Signs BP 124/78 (BP Location: Right Arm)   Pulse 62   Temp 98 F (36.7 C) (Oral)   Resp 20   Ht '5\' 8"'$  (1.727 m)   Wt 81.6 kg   SpO2 99%   BMI 27.35 kg/m  Physical Exam Constitutional:      Comments: Patient is alert with clear mental status.  No respiratory distress at rest.  HENT:     Mouth/Throat:     Mouth: Mucous membranes are moist.     Pharynx: Oropharynx is clear.  Eyes:     Extraocular Movements: Extraocular movements intact.  Cardiovascular:     Comments: Heart regular. Pulmonary:     Effort: Pulmonary effort is normal.     Breath sounds: Normal breath sounds.  Abdominal:     General: There is no distension.     Palpations: Abdomen is soft.     Tenderness: There is no abdominal tenderness.  There is no guarding.  Musculoskeletal:        General: No swelling. Normal range of motion.     Right lower leg: No edema.     Left lower leg: No edema.  Skin:    General: Skin is warm and dry.  Neurological:     General: No focal deficit present.     Mental Status: He is oriented to person, place, and time.     Motor: No weakness.     Coordination: Coordination normal.  Psychiatric:        Mood and Affect: Mood normal.     ED Results / Procedures / Treatments   Labs (all labs ordered are listed, but only abnormal results are displayed) Labs Reviewed  RESP PANEL BY RT-PCR (RSV, FLU A&B, COVID)  [id number] - Abnormal; Notable for the following components:      Result Value   Influenza B by PCR POSITIVE (*)    All other components within normal limits  GROUP  A STREP BY PCR    EKG None  Radiology No results found.  Procedures Procedures    Medications Ordered in ED Medications  acetaminophen (TYLENOL) tablet 1,000 mg (1,000 mg Oral Given 07/20/22 2213)  enoxaparin (LOVENOX) injection 80 mg (80 mg Subcutaneous Given 07/20/22 2214)    ED Course/ Medical Decision Making/ A&P                           Medical Decision Making Amount and/or Complexity of Data Reviewed Radiology: ordered.  Risk OTC drugs. Prescription drug management.   Patient has severe comorbid medical conditions.  Has had general constitutional symptoms of achiness and cough for several days.  Differential diagnosis includes CHF exacerbation\ACS\pulmonary embolus\pneumonia\viral syndrome.  Patient test positive for influenza B.   Will obtain chest x-ray given patient's significant history of congestive heart failure and valve replacement.  Chest x-ray reviewed by radiology positive for cardiomegaly and a mechanical valve but no appearance of vascular congestion or focal consolidations.  At young age, patient does have significant comorbid cardiac conditions.  This time however symptoms are consistent with influenza.  Patient is otherwise stable in appearance.  He does not have hypotension or tachycardia.  He has been compliant on his regular medications.  Chest x-ray is clear and there is no hypoxia or respiratory distress.  Patient is high risk for complicated course of influenza but at this time, he is stable with normal vital signs and appropriate for outpatient management.  Have reviewed safe medications to take.  He has a counseled to take extra strength Tylenol for control of body ache and fever and Tessalon Perles for cough.  Also prescribed Hycodan syrup for cough if needed.  I have counseled on avoiding stimulant decongestants with patient's history of atrial fibrillation and taking amiodarone.  Will follow-up with PCP ASAP and return to emergency  department immediately if worsening concerning changes           Final Clinical Impression(s) / ED Diagnoses Final diagnoses:  Influenza B  Severe comorbid illness    Rx / DC Orders ED Discharge Orders          Ordered    benzonatate (TESSALON) 100 MG capsule  Every 8 hours,   Status:  Discontinued        07/20/22 2151    HYDROcodone bit-homatropine (HYCODAN) 5-1.5 MG/5ML syrup  Every 6 hours PRN,   Status:  Discontinued  07/20/22 2154    benzonatate (TESSALON) 100 MG capsule  Every 8 hours        07/20/22 2158    HYDROcodone bit-homatropine (HYCODAN) 5-1.5 MG/5ML syrup  Every 6 hours PRN        07/20/22 2158              Charlesetta Shanks, MD 07/30/22 1849

## 2022-07-20 NOTE — ED Triage Notes (Signed)
Patient here POV from Home.  Endorses Congestion, Cough that began 3 Days ago. Some Chills. No Known Fevers.   NAD Noted during Triage. A&Ox4. GCS 15. Ambulatory.

## 2022-07-22 ENCOUNTER — Encounter (HOSPITAL_COMMUNITY): Payer: Self-pay | Admitting: Certified Registered Nurse Anesthetist

## 2022-07-23 ENCOUNTER — Ambulatory Visit (HOSPITAL_COMMUNITY)
Admission: RE | Admit: 2022-07-23 | Payer: BLUE CROSS/BLUE SHIELD | Source: Ambulatory Visit | Admitting: Gastroenterology

## 2022-07-23 ENCOUNTER — Encounter (HOSPITAL_COMMUNITY): Admission: RE | Payer: Self-pay | Source: Ambulatory Visit

## 2022-07-23 SURGERY — COLONOSCOPY WITH PROPOFOL
Anesthesia: Monitor Anesthesia Care | Laterality: Bilateral

## 2022-07-29 ENCOUNTER — Ambulatory Visit: Payer: BLUE CROSS/BLUE SHIELD | Attending: Cardiology

## 2022-07-29 DIAGNOSIS — I480 Paroxysmal atrial fibrillation: Secondary | ICD-10-CM | POA: Diagnosis not present

## 2022-07-29 DIAGNOSIS — Z8679 Personal history of other diseases of the circulatory system: Secondary | ICD-10-CM | POA: Diagnosis not present

## 2022-07-29 DIAGNOSIS — Z5181 Encounter for therapeutic drug level monitoring: Secondary | ICD-10-CM | POA: Diagnosis not present

## 2022-07-29 DIAGNOSIS — Z9889 Other specified postprocedural states: Secondary | ICD-10-CM

## 2022-07-29 DIAGNOSIS — Z954 Presence of other heart-valve replacement: Secondary | ICD-10-CM

## 2022-07-29 DIAGNOSIS — I351 Nonrheumatic aortic (valve) insufficiency: Secondary | ICD-10-CM | POA: Diagnosis not present

## 2022-07-29 DIAGNOSIS — I359 Nonrheumatic aortic valve disorder, unspecified: Secondary | ICD-10-CM | POA: Diagnosis not present

## 2022-07-29 LAB — POCT INR: INR: 2.3 (ref 2.0–3.0)

## 2022-07-29 NOTE — Patient Instructions (Signed)
TAKE 2 TABLETS TODAY ONLY THEN Continue taking Warfarin 1 tablet daily. Ablation 1/24 weekly INRs Stay consistent with greens (3-4 times per week)  Recheck INR in 1 week  Coumadin Clinic [phone], call with any changes in medication or up coming procedure.  *Amio '200mg'$  daily*

## 2022-07-30 ENCOUNTER — Ambulatory Visit (HOSPITAL_COMMUNITY)
Admission: RE | Admit: 2022-07-30 | Discharge: 2022-07-30 | Disposition: A | Discharge status: Home / Self Care | Payer: BLUE CROSS/BLUE SHIELD | Source: Ambulatory Visit | Attending: Physician Assistant | Admitting: Physician Assistant

## 2022-07-30 ENCOUNTER — Encounter (HOSPITAL_COMMUNITY): Payer: Self-pay | Admitting: Physician Assistant

## 2022-07-30 VITALS — BP 130/80 | HR 58 | Ht 68.0 in | Wt 178.8 lb

## 2022-07-30 DIAGNOSIS — F1011 Alcohol abuse, in remission: Secondary | ICD-10-CM | POA: Diagnosis not present

## 2022-07-30 DIAGNOSIS — Z87891 Personal history of nicotine dependence: Secondary | ICD-10-CM | POA: Diagnosis not present

## 2022-07-30 DIAGNOSIS — G4733 Obstructive sleep apnea (adult) (pediatric): Secondary | ICD-10-CM | POA: Insufficient documentation

## 2022-07-30 DIAGNOSIS — Z7901 Long term (current) use of anticoagulants: Secondary | ICD-10-CM | POA: Insufficient documentation

## 2022-07-30 DIAGNOSIS — R001 Bradycardia, unspecified: Secondary | ICD-10-CM | POA: Diagnosis not present

## 2022-07-30 DIAGNOSIS — Z79899 Other long term (current) drug therapy: Secondary | ICD-10-CM | POA: Diagnosis not present

## 2022-07-30 DIAGNOSIS — I110 Hypertensive heart disease with heart failure: Secondary | ICD-10-CM | POA: Diagnosis not present

## 2022-07-30 DIAGNOSIS — I484 Atypical atrial flutter: Secondary | ICD-10-CM | POA: Insufficient documentation

## 2022-07-30 DIAGNOSIS — I4819 Other persistent atrial fibrillation: Secondary | ICD-10-CM | POA: Diagnosis not present

## 2022-07-30 DIAGNOSIS — I5022 Chronic systolic (congestive) heart failure: Secondary | ICD-10-CM | POA: Insufficient documentation

## 2022-07-30 DIAGNOSIS — I7121 Aneurysm of the ascending aorta, without rupture: Secondary | ICD-10-CM | POA: Diagnosis not present

## 2022-07-30 DIAGNOSIS — D6869 Other thrombophilia: Secondary | ICD-10-CM | POA: Diagnosis not present

## 2022-07-30 DIAGNOSIS — R9431 Abnormal electrocardiogram [ECG] [EKG]: Secondary | ICD-10-CM | POA: Insufficient documentation

## 2022-07-30 DIAGNOSIS — I447 Left bundle-branch block, unspecified: Secondary | ICD-10-CM | POA: Insufficient documentation

## 2022-07-30 LAB — CBC
HCT: 32 % — ABNORMAL LOW (ref 39.0–52.0)
Hemoglobin: 8.9 g/dL — ABNORMAL LOW (ref 13.0–17.0)
MCH: 22.8 pg — ABNORMAL LOW (ref 26.0–34.0)
MCHC: 27.8 g/dL — ABNORMAL LOW (ref 30.0–36.0)
MCV: 82.1 fL (ref 80.0–100.0)
Platelets: 369 10*3/uL (ref 150–400)
RBC: 3.9 MIL/uL — ABNORMAL LOW (ref 4.22–5.81)
RDW: 19.6 % — ABNORMAL HIGH (ref 11.5–15.5)
WBC: 5.9 10*3/uL (ref 4.0–10.5)
nRBC: 0 % (ref 0.0–0.2)

## 2022-07-30 LAB — BASIC METABOLIC PANEL
Anion gap: 9 (ref 5–15)
BUN: 15 mg/dL (ref 6–20)
CO2: 24 mmol/L (ref 22–32)
Calcium: 8.7 mg/dL — ABNORMAL LOW (ref 8.9–10.3)
Chloride: 102 mmol/L (ref 98–111)
Creatinine, Ser: 0.94 mg/dL (ref 0.61–1.24)
GFR, Estimated: >60 mL/min (ref >60)
Glucose, Bld: 106 mg/dL — ABNORMAL HIGH (ref 70–99)
Potassium: 4.6 mmol/L (ref 3.5–5.1)
Sodium: 135 mmol/L (ref 135–145)

## 2022-07-30 NOTE — Progress Notes (Signed)
Primary Care Physician: Merrilee Seashore, MD Primary Cardiologist: Dr Aundra Dubin Primary Electrophysiologist: Dr Curt Bears Referring Physician: Dr Curt Bears [name] Hinton is a 52 y.o. male with a history of alcohol abuse, tobacco use, bicuspid AV s/p Bentall and MAZE, systolic CHF, HTN, OSA, atrial flutter, atrial fibrillation who presents for follow up in the [facility]. He was hospitalized 2015 with heart failure and severe aortic insufficiency.  Found to have atrial flutter.  He was found to have a bicuspid aortic valve and ascending aortic aneurysm.  He is status post Bentall procedure with ascending aortic aneurysm and arch replacement with aortic valve replacement March 2015.  He had a maze at the time with left atrial appendage occlusion.  He has had episodes of atypical atrial flutter.  He was initially on amiodarone.   In 2018 he was admitted for lower GI bleed.  This suspected due to hemorrhoids.  2019 his ejection fraction improved to normal.   July 2023, he went back into atrial flutter.  He had a TEE and cardioversion for atypical atrial flutter.  Amiodarone was resumed. Patient is on warfarin for a CHADS2VASC score of 2. He was seen by Dr Curt Bears who recommended ablation.  He was hospitalized again for acute anemia, hgb 6.6. Fecal occult was positive and he received 2 units PRBC. Patient deferred colonoscopy to do as an outpatient.   On follow up today, patient reports that he feels well. He remains in SR. No recent bleeding issues. Patient is scheduled for ablation 08/13/22 with Dr Curt Bears.   Today, he denies symptoms of palpitations, chest pain, shortness of breath, orthopnea, PND, lower extremity edema, dizziness, presyncope, syncope, snoring, daytime somnolence, bleeding, or neurologic sequela. The patient is tolerating medications without difficulties and is otherwise without complaint today.    Atrial Fibrillation Risk Factors:  he does have  symptoms or diagnosis of sleep apnea. he is waiting for CPAP. he does not have a history of rheumatic fever. he does have a history of alcohol use.   he has a BMI of Body mass index is 27.19 kg/m.[name] Filed Weights   07/30/22 1451  Weight: 81.1 kg    Family History  Problem Relation Age of Onset   Heart failure Father    Coronary artery disease Father        CABG     Atrial Fibrillation Management history:  Previous antiarrhythmic drugs: amiodarone Previous cardioversions: 2015 x 2, 11/15/21, 02/14/22, 03/10/22 Previous ablations: none CHADS2VASC score: 2 Anticoagulation history: warfarin   Past Medical History:  Diagnosis Date   Dysrhythmia    ETOH abuse    Heart murmur    Hx of repair of ascending aorta 09/27/2013   Non-ischemic cardiomyopathy (HCC)    PAF (paroxysmal atrial fibrillation) ([place])    a. Dx 07/2013, s/p TEE/DCCV.   Paroxysmal atrial flutter ([place])    a. Dx 07/2013.   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 Bicuspid aortic valve with severe AI, dilated ascending aorta dx 07/2013.   Systolic CHF ([place])    a. Dx 07/2013: EF 25%, felt likely related to combo of EtOH + tachy-mediated + AI. LHC pending.  Past Surgical History:  Procedure Laterality Date   AORTIC VALVE REPLACEMENT  2015   (using a Carbo medics Carbo-Seal Valsalva, valve size 29 mm)   ASCENDING AORTIC ROOT REPLACEMENT N/A 09/27/2013   Procedure: ASCENDING AORTIC ROOT REPLACEMENT;  Surgeon: [name], MD;  Location: [facility];  Service: Open Heart Surgery;  Laterality: N/A;   BENTALL PROCEDURE N/A 09/27/2013   Procedure: BENTALL PROCEDURE;  Surgeon: [name], MD;  Location: [facility];  Service: Open Heart Surgery;  Laterality: N/A;   CARDIAC CATHETERIZATION     CARDIOVERSION N/A 08/01/2013   Procedure: CARDIOVERSION;  Surgeon: [name], MD;  Location: [facility];  Service: Cardiovascular;  Laterality: N/A;   CARDIOVERSION N/A 11/15/2021   Procedure: CARDIOVERSION;  Surgeon: [name], MD;  Location: [facility] ENDOSCOPY;  Service: Cardiovascular;  Laterality: N/A;   CARDIOVERSION N/A 02/14/2022   Procedure: CARDIOVERSION;  Surgeon: [name], MD;  Location: [facility] ENDOSCOPY;  Service: Cardiovascular;  Laterality: N/A;   CARDIOVERSION N/A 03/10/2022   Procedure: CARDIOVERSION;  Surgeon: [name], MD;  Location: [facility] ENDOSCOPY;  Service: Cardiovascular;  Laterality: N/A;   COLONOSCOPY WITH PROPOFOL Left 10/10/2016   Procedure: COLONOSCOPY WITH PROPOFOL;  Surgeon: Arta Silence, MD;  Location: [facility] ENDOSCOPY;  Service: Endoscopy;  Laterality: Left;   INTRAOPERATIVE TRANSESOPHAGEAL ECHOCARDIOGRAM N/A 09/27/2013   Procedure: INTRAOPERATIVE TRANSESOPHAGEAL ECHOCARDIOGRAM;  Surgeon: [name], MD;  Location: [facility];  Service: Open Heart Surgery;  Laterality: N/A;   LEFT AND RIGHT HEART CATHETERIZATION WITH CORONARY ANGIOGRAM N/A 09/05/2013   Procedure: LEFT AND RIGHT HEART CATHETERIZATION WITH CORONARY ANGIOGRAM;  Surgeon: [name], MD;  Location: [facility] CATH LAB;  Service: Cardiovascular;  Laterality: N/A;   MAZE N/A 09/27/2013   Procedure: MAZE;  Surgeon: [name], MD;  Location: [facility];  Service: Open Heart Surgery;  Laterality: N/A;   NO PAST SURGERIES     TEE WITHOUT CARDIOVERSION N/A 08/01/2013   Procedure: TRANSESOPHAGEAL ECHOCARDIOGRAM (TEE);  Surgeon: [name], MD;  Location: [facility];  Service: Cardiovascular;  Laterality: N/A;   TEE WITHOUT CARDIOVERSION N/A 11/15/2021   Procedure: TRANSESOPHAGEAL ECHOCARDIOGRAM (TEE);  Surgeon: [name], MD;  Location: [facility] ENDOSCOPY;  Service: Cardiovascular;   Laterality: N/A;   TEE WITHOUT CARDIOVERSION N/A 03/10/2022   Procedure: TRANSESOPHAGEAL ECHOCARDIOGRAM (TEE);  Surgeon: [name], MD;  Location: [facility] ENDOSCOPY;  Service: Cardiovascular;  Laterality: N/A;    Current Outpatient Medications  Medication Sig Dispense Refill   amiodarone (PACERONE) 200 MG tablet Take 1 tablet (200 mg total) by mouth daily. 90 tablet 0   aspirin EC 81 MG tablet Take 1 tablet (81 mg total) by mouth daily. 90 tablet 3   benzonatate (TESSALON) 100 MG capsule Take 1 capsule (100 mg total) by mouth every 8 (eight) hours. 21 capsule 0   carvedilol (COREG) 12.5 MG tablet TAKE 1 TABLET(12.5 MG) BY MOUTH TWICE DAILY WITH A MEAL 60 tablet 11   enoxaparin (LOVENOX) 80 MG/0.8ML injection Inject 0.8 mLs (80 mg total) into the skin every 12 (twelve) hours. 16 mL 1   furosemide (LASIX) 20 MG tablet Take 1 tablet (20 mg total) by mouth as needed. 45 tablet 1   HYDROcodone bit-homatropine (HYCODAN) 5-1.5 MG/5ML syrup Take 5 mLs by mouth every 6 (six) hours as needed for cough. 120 mL 0   potassium chloride SA (KLOR-CON M) 20 MEQ tablet Take 1 tablet (20 mEq total) by mouth as needed. 20 tablet 4  spironolactone (ALDACTONE) 25 MG tablet TAKE 1 TABLET(25 MG) BY MOUTH DAILY 90 tablet 2   valsartan (DIOVAN) 80 MG tablet Take 1 tablet (80 mg total) by mouth 2 (two) times daily. 180 tablet 3   warfarin (COUMADIN) 5 MG tablet Take 1 tablet (5 mg total) by mouth daily.     No current facility-administered medications for this encounter.    Allergies  Allergen Reactions   Morphine And Related Nausea Only    Social History   Socioeconomic History   Marital status: Married    Spouse name: Not on file   Number of children: Not on file   Years of education: Not on file   Highest education level: Not on file  Occupational History   Occupation: Restaurant work at [employer] Use   Smoking status: Former    Packs/day: 0.50    Years: 20.00    Total pack years: 10.00     Types: Cigarettes    Quit date: 09/25/2009    Years since quitting: 12.8   Smokeless tobacco: Never   Tobacco comments:    Former smoker 07/30/22  Vaping Use   Vaping Use: Never used  Substance and Sexual Activity   Alcohol use: Not Currently    Comment: [place]bon Every Night    Drug use: No   Sexual activity: Not on file  Other Topics Concern   Not on file  Social History Narrative   Lives with wife.   Social Determinants of Health   Financial Resource Strain: Not on file  Food Insecurity: No Food Insecurity (07/07/2022)   Hunger Vital Sign    Worried About Running Out of Food in the Last Year: Never true    Ran Out of Food in the Last Year: Never true  Transportation Needs: No Transportation Needs (07/07/2022)   PRAPARE - Hydrologist (Medical): No    Lack of Transportation (Non-Medical): No  Physical Activity: Not on file  Stress: Not on file  Social Connections: Not on file  Intimate Partner Violence: Not At Risk (07/07/2022)   Humiliation, Afraid, Rape, and Kick questionnaire    Fear of Current or Ex-Partner: No    Emotionally Abused: No    Physically Abused: No    Sexually Abused: No     ROS- All systems are reviewed and negative except as per the HPI above.  Physical Exam: Vitals:   07/30/22 1451  BP: 130/80  Pulse: (!) 58  Weight: 81.1 kg  Height: '5\' 8"'$  (1.727 m)    GEN- The patient is a well appearing male, alert and oriented x 3 today.   Head- normocephalic, atraumatic Eyes-  Sclera clear, conjunctiva pink Ears- hearing intact Oropharynx- clear Neck- supple  Lungs- Clear to ausculation bilaterally, normal work of breathing Heart- Regular rate and rhythm, no murmurs, rubs or gallops  GI- soft, NT, ND, + BS Extremities- no clubbing, cyanosis, or edema MS- no significant deformity or atrophy Skin- no rash or lesion Psych- euthymic mood, full affect Neuro- strength and sensation are intact  Wt Readings from Last 3  Encounters:  07/30/22 81.1 kg  07/20/22 81.6 kg  07/06/22 81.6 kg    EKG today demonstrates  SB, 1st degree AV block, ST-T changes baseline Vent. rate 58 BPM PR interval 208 ms QRS duration 104 ms QT/QTcB 476/467 ms  Echo 05/14/20 demonstrated   1. Distal septal hypokinesis . Left ventricular ejection fraction, by  estimation, is 50 to 55%. The left ventricle  has low normal function. The  left ventricle has no regional wall motion abnormalities. Left ventricular  diastolic parameters were normal.   2. Right ventricular systolic function is normal. The right ventricular  size is normal. There is normal pulmonary artery systolic pressure.   3. Left atrial size was moderately dilated.   4. Right atrial size was moderately dilated.   5. The mitral valve is normal in structure. Trivial mitral valve  regurgitation. No evidence of mitral stenosis.   6. Post Bental with mechanical AVR normal function no PVL/AR mean  gradient stable since previous echo done November 2019 Trivial closing  volume AR. The aortic valve has been repaired/replaced. Aortic valve  regurgitation is trivial. No aortic stenosis is   present. There is a 29 mm Sorin Carbomedics mechanical valve present in  the aortic position. Procedure Date: 09/27/2013.   7. The inferior vena cava is normal in size with greater than 50%  respiratory variability, suggesting right atrial pressure of 3 mmHg.   Epic records are reviewed at length today  CHA2DS2-VASc Score = 2  The patient's score is based upon: CHF History: 1 HTN History: 1 Diabetes History: 0 Stroke History: 0 Vascular Disease History: 0 Age Score: 0 Gender Score: 0       ASSESSMENT AND PLAN: 1. Persistent Atrial Fibrillation/atrial flutter The patient's CHA2DS2-VASc score is 2, indicating a 2.2% annual risk of stroke.   Patient in [facility] today.  Patient scheduled for ablation 08/13/22 with Dr Curt Bears. CT scan 08/06/22 Continue amiodarone 200 mg  daily Continue carvedilol 12.5 mg BID Continue warfarin  2. Secondary Hypercoagulable State (ICD10:  D68.69) The patient is at significant risk for stroke/thromboembolism based upon his CHA2DS2-VASc Score of 2.  Continue Warfarin (Coumadin).   3. Chronic systolic CHF Thought to be tachycardia mediated. Followed in [facility] Fluid status appears stable.  4. Severe AI Bicuspid AV s/p Bentall. Mech AV Continue warfarin  5. HTN Stable, no changes today.  6. OSA The importance of adequate treatment of sleep apnea was discussed today in order to improve our ability to maintain sinus rhythm long term. Patient waiting on CPAP.   Follow up for ablation as scheduled.    [facility] [address] [place],  [address] [phone] 07/30/2022 4:25 PM

## 2022-08-04 ENCOUNTER — Telehealth (HOSPITAL_COMMUNITY): Payer: Self-pay | Admitting: Emergency Medicine

## 2022-08-04 NOTE — Telephone Encounter (Signed)
Attempted to call patient regarding upcoming cardiac CT appointment. Left message on voicemail with name and callback number Marchia Bond RN Navigator Cardiac Section Heart and Vascular Services [phone] Office [phone] Cell

## 2022-08-05 ENCOUNTER — Ambulatory Visit (HOSPITAL_BASED_OUTPATIENT_CLINIC_OR_DEPARTMENT_OTHER): Payer: BLUE CROSS/BLUE SHIELD | Attending: Cardiology

## 2022-08-06 ENCOUNTER — Ambulatory Visit (HOSPITAL_BASED_OUTPATIENT_CLINIC_OR_DEPARTMENT_OTHER): Admission: RE | Admit: 2022-08-06 | Payer: BLUE CROSS/BLUE SHIELD | Source: Ambulatory Visit

## 2022-08-08 ENCOUNTER — Telehealth (HOSPITAL_COMMUNITY): Payer: Self-pay | Admitting: *Deleted

## 2022-08-08 NOTE — Telephone Encounter (Signed)
Attempted to call patient regarding upcoming cardiac CT appointment. °Left message on voicemail with name and callback number ° °Deshon Hsiao RN Navigator Cardiac Imaging °[facility] °[phone] Office °[phone] Cell ° °

## 2022-08-11 ENCOUNTER — Ambulatory Visit (HOSPITAL_COMMUNITY)
Admission: RE | Admit: 2022-08-11 | Discharge: 2022-08-11 | Disposition: A | Discharge status: Home / Self Care | Payer: BLUE CROSS/BLUE SHIELD | Source: Ambulatory Visit | Attending: Cardiology | Admitting: Cardiology

## 2022-08-11 DIAGNOSIS — I4819 Other persistent atrial fibrillation: Secondary | ICD-10-CM | POA: Diagnosis not present

## 2022-08-11 MED ORDER — IOHEXOL 350 MG/ML SOLN
95.0000 mL | Freq: Once | INTRAVENOUS | Status: AC | PRN
  Administered 2022-08-11: 95 mL via INTRAVENOUS

## 2022-08-12 ENCOUNTER — Telehealth: Payer: Self-pay

## 2022-08-12 ENCOUNTER — Ambulatory Visit: Payer: BLUE CROSS/BLUE SHIELD | Attending: Cardiology | Admitting: *Deleted

## 2022-08-12 ENCOUNTER — Telehealth: Payer: Self-pay | Admitting: *Deleted

## 2022-08-12 DIAGNOSIS — I359 Nonrheumatic aortic valve disorder, unspecified: Secondary | ICD-10-CM

## 2022-08-12 DIAGNOSIS — Z954 Presence of other heart-valve replacement: Secondary | ICD-10-CM | POA: Diagnosis not present

## 2022-08-12 DIAGNOSIS — Z5181 Encounter for therapeutic drug level monitoring: Secondary | ICD-10-CM | POA: Diagnosis not present

## 2022-08-12 DIAGNOSIS — I351 Nonrheumatic aortic (valve) insufficiency: Secondary | ICD-10-CM

## 2022-08-12 DIAGNOSIS — I480 Paroxysmal atrial fibrillation: Secondary | ICD-10-CM | POA: Diagnosis not present

## 2022-08-12 DIAGNOSIS — Z9889 Other specified postprocedural states: Secondary | ICD-10-CM

## 2022-08-12 DIAGNOSIS — Z8679 Personal history of other diseases of the circulatory system: Secondary | ICD-10-CM

## 2022-08-12 LAB — POCT INR: INR: 4.9 — AB (ref 2.0–3.0)

## 2022-08-12 NOTE — Telephone Encounter (Signed)
Pt was scheduled for Ablation tomorrow 08/12/22 but had to cancel due to his INR being high.   He has been rescheduled to 5/30 at 7:30am.

## 2022-08-12 NOTE — Patient Instructions (Addendum)
Description   Spoke with pt and advised not to take any warfarin today and take 1/2 tablet tomorrow then continue taking Warfarin 1 tablet daily. Ablation 1/24 weekly INRs. Stay consistent with greens (3-4 times per week)  Recheck INR in 2 weeks, pt wants to do 2 weeks since ablation canceled. Coumadin Clinic [phone], call with any changes in medication or up coming procedure.  *Amio '200mg'$  daily*

## 2022-08-12 NOTE — Telephone Encounter (Signed)
Called pt since he was seen in Anticoagulation Clinic and INR was supratherapeutic at 4.9 and he was pending an Ablation on 08/13/22. Sent a message to [place], Therapist, sports and she communicated the results to Dr. Curt Bears. Received a message back stating that the ablation is being canceled.  Called the pt and let him know that they are canceling the ablation tomorrow. He verbalized understanding and was appreciative of the call. Also, gave him detailed dosing instructions for warfarin (see anticoagulation encounter from week). Also, advised that he will receive a follow-up call (no time frame given) regarding getting  this rescheduled from our EP RN or EP Scheduler.   He was thankful and will await a follow up. Will forward to [place], Therapist, sports as requested.

## 2022-08-27 ENCOUNTER — Ambulatory Visit: Payer: BLUE CROSS/BLUE SHIELD | Attending: Cardiovascular Disease | Admitting: *Deleted

## 2022-08-27 DIAGNOSIS — Z9889 Other specified postprocedural states: Secondary | ICD-10-CM | POA: Diagnosis not present

## 2022-08-27 DIAGNOSIS — I351 Nonrheumatic aortic (valve) insufficiency: Secondary | ICD-10-CM

## 2022-08-27 DIAGNOSIS — Z8679 Personal history of other diseases of the circulatory system: Secondary | ICD-10-CM | POA: Diagnosis not present

## 2022-08-27 DIAGNOSIS — I359 Nonrheumatic aortic valve disorder, unspecified: Secondary | ICD-10-CM | POA: Diagnosis not present

## 2022-08-27 DIAGNOSIS — Z5181 Encounter for therapeutic drug level monitoring: Secondary | ICD-10-CM | POA: Diagnosis not present

## 2022-08-27 DIAGNOSIS — Z954 Presence of other heart-valve replacement: Secondary | ICD-10-CM

## 2022-08-27 DIAGNOSIS — I480 Paroxysmal atrial fibrillation: Secondary | ICD-10-CM

## 2022-08-27 LAB — POCT INR: INR: 4.3 — AB (ref 2.0–3.0)

## 2022-08-27 NOTE — Patient Instructions (Signed)
Description   Do not take any warfarin today then start taking Warfarin 1 tablet daily excvpt 1/2 tablet on Sundays. Ablation in MAY 2024 will need weekly INRs. Stay consistent with greens (3-4 times per week)  Recheck INR in 2 weeks, Coumadin Clinic [phone], call with any changes in medication or up coming procedure.  *Amio '200mg'$  daily*

## 2022-09-05 ENCOUNTER — Other Ambulatory Visit: Payer: Self-pay | Admitting: Cardiology

## 2022-09-05 DIAGNOSIS — I480 Paroxysmal atrial fibrillation: Secondary | ICD-10-CM

## 2022-09-05 DIAGNOSIS — Q231 Congenital insufficiency of aortic valve: Secondary | ICD-10-CM

## 2022-09-08 NOTE — Telephone Encounter (Signed)
Warfarin 77m refill Afib Last INR 08/27/22 Last OV 07/30/22

## 2022-09-09 ENCOUNTER — Ambulatory Visit: Payer: BLUE CROSS/BLUE SHIELD | Attending: Cardiology | Admitting: *Deleted

## 2022-09-09 DIAGNOSIS — I359 Nonrheumatic aortic valve disorder, unspecified: Secondary | ICD-10-CM

## 2022-09-09 DIAGNOSIS — Z8679 Personal history of other diseases of the circulatory system: Secondary | ICD-10-CM

## 2022-09-09 DIAGNOSIS — Z5181 Encounter for therapeutic drug level monitoring: Secondary | ICD-10-CM | POA: Diagnosis not present

## 2022-09-09 DIAGNOSIS — I480 Paroxysmal atrial fibrillation: Secondary | ICD-10-CM

## 2022-09-09 DIAGNOSIS — Z9889 Other specified postprocedural states: Secondary | ICD-10-CM

## 2022-09-09 DIAGNOSIS — I351 Nonrheumatic aortic (valve) insufficiency: Secondary | ICD-10-CM | POA: Diagnosis not present

## 2022-09-09 DIAGNOSIS — Z954 Presence of other heart-valve replacement: Secondary | ICD-10-CM

## 2022-09-09 LAB — POCT INR: POC INR: 2.6

## 2022-09-09 NOTE — Patient Instructions (Addendum)
Description   START taking warfarin 1 tablet daily except for 1/2 a tablet on Sundays and Thursdays.  Stay consistent with greens (3-4 times per week)  Recheck INR in 2 weeks, Coumadin Clinic [phone], call with any changes in medication or up coming  Ablation in May 2024

## 2022-09-10 ENCOUNTER — Ambulatory Visit (HOSPITAL_COMMUNITY): Payer: BC Managed Care – PPO | Admitting: Physician Assistant

## 2022-09-15 ENCOUNTER — Other Ambulatory Visit (HOSPITAL_COMMUNITY): Payer: Self-pay | Admitting: Cardiology

## 2022-09-23 ENCOUNTER — Ambulatory Visit: Payer: BLUE CROSS/BLUE SHIELD | Attending: Cardiology | Admitting: *Deleted

## 2022-09-23 DIAGNOSIS — I359 Nonrheumatic aortic valve disorder, unspecified: Secondary | ICD-10-CM | POA: Diagnosis not present

## 2022-09-23 DIAGNOSIS — Z954 Presence of other heart-valve replacement: Secondary | ICD-10-CM

## 2022-09-23 DIAGNOSIS — I480 Paroxysmal atrial fibrillation: Secondary | ICD-10-CM | POA: Diagnosis not present

## 2022-09-23 DIAGNOSIS — Z5181 Encounter for therapeutic drug level monitoring: Secondary | ICD-10-CM

## 2022-09-23 DIAGNOSIS — I351 Nonrheumatic aortic (valve) insufficiency: Secondary | ICD-10-CM | POA: Diagnosis not present

## 2022-09-23 DIAGNOSIS — Z9889 Other specified postprocedural states: Secondary | ICD-10-CM

## 2022-09-23 DIAGNOSIS — Z8679 Personal history of other diseases of the circulatory system: Secondary | ICD-10-CM

## 2022-09-23 LAB — POCT INR: INR: 3.3 — AB (ref 2.0–3.0)

## 2022-09-23 NOTE — Patient Instructions (Addendum)
Description   Continue taking warfarin 1 tablet daily except for 1/2 tablet on Sundays and Thursdays.  Stay consistent with greens (3-4 times per week)  Recheck INR in 3 weeks, Coumadin Clinic [phone], call with any changes in medication or up coming  Ablation in May 2024

## 2022-10-14 ENCOUNTER — Ambulatory Visit: Payer: BLUE CROSS/BLUE SHIELD | Attending: Cardiology | Admitting: *Deleted

## 2022-10-14 DIAGNOSIS — Z954 Presence of other heart-valve replacement: Secondary | ICD-10-CM

## 2022-10-14 DIAGNOSIS — Z9889 Other specified postprocedural states: Secondary | ICD-10-CM

## 2022-10-14 DIAGNOSIS — I359 Nonrheumatic aortic valve disorder, unspecified: Secondary | ICD-10-CM

## 2022-10-14 DIAGNOSIS — I351 Nonrheumatic aortic (valve) insufficiency: Secondary | ICD-10-CM

## 2022-10-14 DIAGNOSIS — I480 Paroxysmal atrial fibrillation: Secondary | ICD-10-CM | POA: Diagnosis not present

## 2022-10-14 DIAGNOSIS — Z5181 Encounter for therapeutic drug level monitoring: Secondary | ICD-10-CM

## 2022-10-14 DIAGNOSIS — Z8679 Personal history of other diseases of the circulatory system: Secondary | ICD-10-CM

## 2022-10-14 LAB — POCT INR: POC INR: 3.7

## 2022-10-14 NOTE — Patient Instructions (Addendum)
Description   Continue taking warfarin 1 tablet daily except for 1/2 tablet on Sundays and Wednesdays  Stay consistent with greens (3-4 times per week) . Eat an extra serving of greens today. Recheck INR in 3 weeks, Coumadin Clinic [phone], call with any changes in medication or up coming  Ablation in May 2024

## 2022-11-04 ENCOUNTER — Ambulatory Visit: Payer: BLUE CROSS/BLUE SHIELD | Attending: Cardiology

## 2022-11-04 DIAGNOSIS — I480 Paroxysmal atrial fibrillation: Secondary | ICD-10-CM | POA: Diagnosis not present

## 2022-11-04 DIAGNOSIS — Z5181 Encounter for therapeutic drug level monitoring: Secondary | ICD-10-CM | POA: Diagnosis not present

## 2022-11-04 DIAGNOSIS — Z9889 Other specified postprocedural states: Secondary | ICD-10-CM

## 2022-11-04 DIAGNOSIS — Z954 Presence of other heart-valve replacement: Secondary | ICD-10-CM | POA: Diagnosis not present

## 2022-11-04 LAB — POCT INR: INR: 3.5 — AB (ref 2.0–3.0)

## 2022-11-04 NOTE — Patient Instructions (Signed)
Description   Continue taking warfarin 1 tablet daily except for 1/2 tablet on Sundays and Wednesdays  Stay consistent with greens (3-4 times per week) . Eat an extra serving of greens today. Recheck INR in 3 weeks, Coumadin Clinic [phone], call with any changes in medication or up coming  Ablation in May 2024       

## 2022-11-10 ENCOUNTER — Encounter: Payer: Self-pay | Admitting: Cardiology

## 2022-11-10 ENCOUNTER — Encounter: Payer: Self-pay | Admitting: *Deleted

## 2022-11-10 ENCOUNTER — Ambulatory Visit: Payer: BLUE CROSS/BLUE SHIELD | Attending: Cardiology | Admitting: Cardiology

## 2022-11-10 VITALS — BP 142/90 | HR 64 | Ht 68.0 in | Wt 183.0 lb

## 2022-11-10 DIAGNOSIS — D6869 Other thrombophilia: Secondary | ICD-10-CM

## 2022-11-10 DIAGNOSIS — Z79899 Other long term (current) drug therapy: Secondary | ICD-10-CM | POA: Diagnosis not present

## 2022-11-10 DIAGNOSIS — I4819 Other persistent atrial fibrillation: Secondary | ICD-10-CM

## 2022-11-10 DIAGNOSIS — I10 Essential (primary) hypertension: Secondary | ICD-10-CM

## 2022-11-10 DIAGNOSIS — Z01812 Encounter for preprocedural laboratory examination: Secondary | ICD-10-CM | POA: Diagnosis not present

## 2022-11-10 NOTE — Progress Notes (Signed)
Electrophysiology Office Note   Date:  11/10/2022   ID:  [name] Hinton, [name] Hinton May 23, 1971, MRN [id number]  PCP:  Georgianne Fick, MD  Cardiologist:  Shirlee Latch Primary Electrophysiologist:  Sahej Hauswirth Jorja Loa, MD    Chief Complaint: atrial flutter   History of Present Illness: [name] Hinton is a 52 y.o. male who is being seen today for the evaluation of atrial flutter at the request of Georgianne Fick, MD. Presenting today for electrophysiology evaluation.  Is a history seen for alcohol abuse, tobacco abuse.  He was hospitalized in 2015 with heart failure and severe aortic insufficiency.  He was found to have atrial flutter.  He had a bicuspid aortic valve and ascending aortic aneurysm.  He is post Ship broker procedure March 2015.  He had a maze at the time and left a later appendage occlusion.  He has had episodes of atypical atrial flutter.  He is on amiodarone.  2023 he went back into atrial flutter.  He had a TEE and cardioversion for atypical atrial flutter.  Amiodarone was started.  He had initially plans for ablation, but his INR was too high.  Ablation has been rescheduled for 12/18/2022.  Today, denies symptoms of palpitations, chest pain, shortness of breath, orthopnea, PND, lower extremity edema, claudication, dizziness, presyncope, syncope, bleeding, or neurologic sequela. The patient is tolerating medications without difficulties.     Past Medical History:  Diagnosis Date   Dysrhythmia    ETOH abuse    Heart murmur    Hx of repair of ascending aorta 09/27/2013   Non-ischemic cardiomyopathy    PAF (paroxysmal atrial fibrillation)    a. Dx 07/2013, s/p TEE/DCCV.   Paroxysmal atrial flutter    a. Dx 07/2013.   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 Bicuspid aortic valve with severe AI, dilated ascending aorta dx 07/2013.   Systolic CHF    a. Dx 07/2013: EF 25%, felt likely related to combo of EtOH + tachy-mediated + AI. LHC pending.   Past Surgical History:  Procedure Laterality Date   AORTIC VALVE REPLACEMENT  2015   (using a Carbo medics Carbo-Seal Valsalva, valve size 29 mm)   ASCENDING AORTIC ROOT REPLACEMENT N/A 09/27/2013   Procedure: ASCENDING AORTIC ROOT REPLACEMENT;  Surgeon: Purcell Nails, MD;  Location: [facility] OR;  Service: Open Heart Surgery;  Laterality: N/A;   BENTALL PROCEDURE N/A 09/27/2013   Procedure: BENTALL PROCEDURE;  Surgeon: Purcell Nails, MD;  Location: [facility] OR;  Service: Open Heart Surgery;  Laterality: N/A;   CARDIAC CATHETERIZATION     CARDIOVERSION N/A 08/01/2013   Procedure: CARDIOVERSION;  Surgeon: Laurey Morale, MD;  Location: [facility] ENDOSCOPY;  Service: Cardiovascular;  Laterality: N/A;   CARDIOVERSION N/A 11/15/2021   Procedure: CARDIOVERSION;  Surgeon: Laurey Morale, MD;  Location: [facility] ENDOSCOPY;  Service: Cardiovascular;  Laterality: N/A;   CARDIOVERSION N/A 02/14/2022   Procedure: CARDIOVERSION;  Surgeon: Laurey Morale, MD;  Location: [facility] ENDOSCOPY;  Service: Cardiovascular;  Laterality: N/A;   CARDIOVERSION N/A 03/10/2022   Procedure: CARDIOVERSION;  Surgeon: Laurey Morale, MD;  Location: [facility] ENDOSCOPY;  Service: Cardiovascular;  Laterality: N/A;   COLONOSCOPY WITH PROPOFOL Left 10/10/2016  Procedure: COLONOSCOPY WITH PROPOFOL;  Surgeon: Willis Modena, MD;  Location: [facility] ENDOSCOPY;  Service: Endoscopy;  Laterality: Left;   INTRAOPERATIVE TRANSESOPHAGEAL ECHOCARDIOGRAM N/A 09/27/2013   Procedure: INTRAOPERATIVE TRANSESOPHAGEAL ECHOCARDIOGRAM;  Surgeon: Purcell Nails, MD;  Location: [facility] OR;  Service: Open Heart Surgery;  Laterality: N/A;   LEFT AND RIGHT HEART CATHETERIZATION WITH  CORONARY ANGIOGRAM N/A 09/05/2013   Procedure: LEFT AND RIGHT HEART CATHETERIZATION WITH CORONARY ANGIOGRAM;  Surgeon: Laurey Morale, MD;  Location: [facility] CATH LAB;  Service: Cardiovascular;  Laterality: N/A;   MAZE N/A 09/27/2013   Procedure: MAZE;  Surgeon: Purcell Nails, MD;  Location: [facility] OR;  Service: Open Heart Surgery;  Laterality: N/A;   NO PAST SURGERIES     TEE WITHOUT CARDIOVERSION N/A 08/01/2013   Procedure: TRANSESOPHAGEAL ECHOCARDIOGRAM (TEE);  Surgeon: Laurey Morale, MD;  Location: [facility] ENDOSCOPY;  Service: Cardiovascular;  Laterality: N/A;   TEE WITHOUT CARDIOVERSION N/A 11/15/2021   Procedure: TRANSESOPHAGEAL ECHOCARDIOGRAM (TEE);  Surgeon: Laurey Morale, MD;  Location: [facility] ENDOSCOPY;  Service: Cardiovascular;  Laterality: N/A;   TEE WITHOUT CARDIOVERSION N/A 03/10/2022   Procedure: TRANSESOPHAGEAL ECHOCARDIOGRAM (TEE);  Surgeon: Laurey Morale, MD;  Location: [facility] ENDOSCOPY;  Service: Cardiovascular;  Laterality: N/A;     Current Outpatient Medications  Medication Sig Dispense Refill   amiodarone (PACERONE) 200 MG tablet TAKE ONE TABLET BY MOUTH ONCE DAILY 90 tablet 0   aspirin EC 81 MG tablet Take 1 tablet (81 mg total) by mouth daily. 90 tablet 3   carvedilol (COREG) 12.5 MG tablet TAKE 1 TABLET(12.5 MG) BY MOUTH TWICE DAILY WITH A MEAL 60 tablet 11   furosemide (LASIX) 20 MG tablet Take 1 tablet (20 mg total) by mouth as needed. 45 tablet 1   potassium chloride SA (KLOR-CON M) 20 MEQ tablet Take 1 tablet (20 mEq total) by mouth as needed. 20 tablet 4   spironolactone (ALDACTONE) 25 MG tablet TAKE 1 TABLET(25 MG) BY MOUTH DAILY 90 tablet 2   valsartan (DIOVAN) 80 MG tablet Take 1 tablet (80 mg total) by mouth 2 (two) times daily. 180 tablet 3   warfarin (COUMADIN) 5 MG tablet TAKE 1-1 1/2 TABLETS DAILY AS DIRECTED 40 tablet 3   benzonatate (TESSALON) 100 MG capsule Take 1 capsule (100 mg total) by mouth every 8 (eight) hours. (Patient not taking: Reported on 11/10/2022)  21 capsule 0   enoxaparin (LOVENOX) 80 MG/0.8ML injection Inject 0.8 mLs (80 mg total) into the skin every 12 (twelve) hours. (Patient not taking: Reported on 11/10/2022) 16 mL 1   HYDROcodone bit-homatropine (HYCODAN) 5-1.5 MG/5ML syrup Take 5 mLs by mouth every 6 (six) hours as needed for cough. (Patient not taking: Reported on 11/10/2022) 120 mL 0   No current facility-administered medications for this visit.    Allergies:   Morphine and related   Social History:  The patient  reports that he quit smoking about 13 years ago. His smoking use included cigarettes. He has a 10.00 pack-year smoking history. He has never used smokeless tobacco. He reports that he does not currently use alcohol. He reports that he does not use drugs.   Family History:  The patient's family history includes Coronary artery disease in his father; Heart failure in his father.   ROS:  Please see the history of present illness.   Otherwise, review of systems is positive for none.   All other systems are reviewed and negative.   PHYSICAL EXAM: VS:  BP (!) 142/90  Pulse 64   Ht 5\' 8"  (1.727 m)   Wt 183 lb (83 kg)   SpO2 99%   BMI 27.83 kg/m  , BMI Body mass index is 27.83 kg/m. GEN: Well nourished, well developed, in no acute distress  HEENT: normal  Neck: no JVD, carotid bruits, or masses Cardiac: RRR; no murmurs, rubs, or gallops,no edema  Respiratory:  clear to auscultation bilaterally, normal work of breathing GI: soft, nontender, nondistended, + BS MS: no deformity or atrophy  Skin: warm and dry Neuro:  Strength and sensation are intact Psych: euthymic mood, full affect  EKG:  EKG is ordered today. Personal review of the ekg ordered  shows sinus rhythm  Recent Labs: 11/14/2021: Magnesium 2.2 02/12/2022: B Natriuretic Peptide 398.9 06/26/2022: TSH 0.922 07/06/2022: ALT 14 07/30/2022: BUN 15; Creatinine, Ser 0.94; Hemoglobin 8.9; Platelets 369; Potassium 4.6; Sodium 135    Lipid Panel     Component  Value Date/Time   CHOL 212 (H) 07/04/2021 1552   TRIG 71 07/04/2021 1552   HDL 82 07/04/2021 1552   CHOLHDL 2.6 07/04/2021 1552   VLDL 14 07/04/2021 1552   LDLCALC 116 (H) 07/04/2021 1552     Wt Readings from Last 3 Encounters:  11/10/22 183 lb (83 kg)  07/30/22 178 lb 12.8 oz (81.1 kg)  07/20/22 179 lb 14.3 oz (81.6 kg)      Other studies Reviewed: Additional studies/ records that were reviewed today include: TEE 12/16/21  Review of the above records today demonstrates:   1. Left ventricular ejection fraction, by estimation, is 50 to 55%. The  left ventricle has low normal function. The left ventricle has no regional  wall motion abnormalities.   2. Right ventricular systolic function is normal. The right ventricular  size is normal. Tricuspid regurgitation signal is inadequate for assessing  PA pressure.   3. The LA appendage has been ligated, no thrombus in the LA. Left atrial  size was mildly dilated. No left atrial/left atrial appendage thrombus was  detected.   4. No PFO/ASD by color doppler.   5. The mitral valve is normal in structure. Trivial mitral valve  regurgitation. No evidence of mitral stenosis.   6. Mechanical aortic valve without significant regurgitation, mean  gradient 7 mmHg.   7. 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 Normal caliber  thoracic aorta with minimal plaque.    ASSESSMENT AND PLAN:  1.  Persistent atrial fibrillation/flutter: CHA2DS2-VASc of at least 2.  Currently on warfarin for mechanical aortic valve.  Has had multiple episodes of atrial flutter.  On amiodarone but with his young age, has plans for ablation 12/18/2022.  He had a prior ablation scheduled but INR was significantly elevated.  All questions were answered.  Does not need a CT scan prior to ablation.  Goal INR prior to the ablation 2.5-3.  2.  Chronic systolic heart failure: Due to nonischemic cardiomyopathy.  Thought tachycardia mediated.  Continue management per  primary cardiology.  3.  Bicuspid aortic valve with severe AI: Post Bentall procedure.  Continue warfarin.  Plan per primary cardiology.  4.  Alcohol abuse: Complete cessation encouraged  5.  Hypertension: Mildly elevated today.  Usually well-controlled.  No changes.  6.  Second hypercoagulable state: Currently on warfarin for atrial fibrillation and mechanical aortic valve  7.  Obstructive sleep apnea: CPAP compliance encouraged  8.  High risk medication monitoring: Currently on amiodarone.  Recent labs within normal limits.   Current medicines are reviewed at length with  the patient today.   The patient does not have concerns regarding his medicines.  The following changes were made today:  none  Labs/ tests ordered today include:  Orders Placed This Encounter  Procedures   Basic metabolic panel   CBC   EKG 12-Lead     Disposition:   FU 4 months  Signed, Dianne Bady Jorja Loa, MD  11/10/2022 12:04 PM     [facility] [address] [place] [address] [phone] (office) [phone] (fax)

## 2022-11-10 NOTE — Patient Instructions (Signed)
Medication Instructions:  Your physician recommends that you continue on your current medications as directed. Please refer to the Current Medication list given to you today.  *If you need a refill on your cardiac medications before your next appointment, please call your pharmacy*   Lab Work: Pre procedure labs today: BMET & CBC  If you have labs (blood work) drawn today and your tests are completely normal, you will receive your results only by: MyChart Message (if you have MyChart) OR A paper copy in the mail If you have any lab test that is abnormal or we need to change your treatment, we will call you to review the results.   Testing/Procedures: None ordered   Follow-Up: At [facility], you and your health needs are our priority.  As part of our continuing mission to provide you with exceptional heart care, we have created designated Provider Care Teams.  These Care Teams include your primary Cardiologist (physician) and Advanced Practice Providers (APPs -  Physician Assistants and Nurse Practitioners) who all work together to provide you with the care you need, when you need it.  Your next appointment:   1 month(s)  The format for your next appointment:   In Person  Provider:   You will follow up in the Atrial Fibrillation Clinic located at [facility]. Your provider will be: Clint R. Fenton, PA-C{ Or Landry Mellow, PA  Thank you for choosing [facility]!!   Dory Horn, RN [phone]  Other Instructions

## 2022-11-11 LAB — CBC
Hematocrit: 34.3 % — ABNORMAL LOW (ref 37.5–51.0)
Hemoglobin: 10.7 g/dL — ABNORMAL LOW (ref 13.0–17.7)
MCH: 27.3 pg (ref 26.6–33.0)
MCHC: 31.2 g/dL — ABNORMAL LOW (ref 31.5–35.7)
MCV: 88 fL (ref 79–97)
Platelets: 264 10*3/uL (ref 150–450)
RBC: 3.92 x10E6/uL — ABNORMAL LOW (ref 4.14–5.80)
RDW: 15.7 % — ABNORMAL HIGH (ref 11.6–15.4)
WBC: 7.7 10*3/uL (ref 3.4–10.8)

## 2022-11-11 LAB — BASIC METABOLIC PANEL
BUN/Creatinine Ratio: 14 (ref 9–20)
BUN: 13 mg/dL (ref 6–24)
CO2: 21 mmol/L (ref 20–29)
Calcium: 9.2 mg/dL (ref 8.7–10.2)
Chloride: 102 mmol/L (ref 96–106)
Creatinine, Ser: 0.93 mg/dL (ref 0.76–1.27)
Glucose: 110 mg/dL — ABNORMAL HIGH (ref 70–99)
Potassium: 4.8 mmol/L (ref 3.5–5.2)
Sodium: 140 mmol/L (ref 134–144)
eGFR: 101 mL/min/{1.73_m2} (ref >59)

## 2022-11-14 ENCOUNTER — Telehealth: Payer: Self-pay | Admitting: *Deleted

## 2022-11-14 ENCOUNTER — Other Ambulatory Visit: Payer: Self-pay | Admitting: *Deleted

## 2022-11-14 NOTE — Telephone Encounter (Signed)
Received phone note:    Price, Annita Brod, RN  P Cv Div Nl Anticoag Pt scheduled for an AFib ablation on 5/30 w/ [place] Needs weekly INRs at least 3 weeks prior. INR goal needs to be 2-3

## 2022-11-17 ENCOUNTER — Telehealth: Payer: Self-pay

## 2022-11-17 DIAGNOSIS — I4891 Unspecified atrial fibrillation: Secondary | ICD-10-CM

## 2022-11-17 NOTE — Telephone Encounter (Signed)
LM for pt to have labwork done at [facility] office while he is there to get his INR checked.

## 2022-11-25 ENCOUNTER — Ambulatory Visit: Payer: BLUE CROSS/BLUE SHIELD | Attending: Cardiology | Admitting: *Deleted

## 2022-11-25 DIAGNOSIS — I480 Paroxysmal atrial fibrillation: Secondary | ICD-10-CM | POA: Diagnosis not present

## 2022-11-25 DIAGNOSIS — Z954 Presence of other heart-valve replacement: Secondary | ICD-10-CM

## 2022-11-25 DIAGNOSIS — Z9889 Other specified postprocedural states: Secondary | ICD-10-CM

## 2022-11-25 DIAGNOSIS — Z5181 Encounter for therapeutic drug level monitoring: Secondary | ICD-10-CM

## 2022-11-25 LAB — POCT INR: POC INR: 2.6

## 2022-11-25 NOTE — Patient Instructions (Signed)
Description   Continue taking warfarin 1 tablet daily except for 1/2 tablet on Sundays and Wednesdays  Stay consistent with greens. Recheck INR in 1 weeks. Needs weekly INR checks for ablation on 12/18/22, per Dr. Elberta Fortis INR goal 2-3 for ablation. Cou[facility]in Clinic [phone], call with any changes in medication or up coming  Ablation in May 2024

## 2022-12-02 ENCOUNTER — Ambulatory Visit: Payer: BLUE CROSS/BLUE SHIELD | Attending: Cardiology | Admitting: *Deleted

## 2022-12-02 DIAGNOSIS — Z9889 Other specified postprocedural states: Secondary | ICD-10-CM

## 2022-12-02 DIAGNOSIS — I480 Paroxysmal atrial fibrillation: Secondary | ICD-10-CM | POA: Diagnosis not present

## 2022-12-02 DIAGNOSIS — Z5181 Encounter for therapeutic drug level monitoring: Secondary | ICD-10-CM | POA: Diagnosis not present

## 2022-12-02 DIAGNOSIS — Z954 Presence of other heart-valve replacement: Secondary | ICD-10-CM | POA: Diagnosis not present

## 2022-12-02 LAB — POCT INR: INR: 2.6 (ref 2.0–3.0)

## 2022-12-02 NOTE — Patient Instructions (Signed)
Description   Today take 1.5 tablets then continue taking warfarin 1 tablet daily except for 1/2 tablet on Sundays and Wednesdays  Stay consistent with greens. Recheck INR in 1 weeks. Needs weekly INR checks for ablation on 12/18/22, per Dr. Elberta Fortis INR goal 2-3 for ablation. Coumadin Clinic [phone], call with any changes in medication or up coming  Ablation in May 2024

## 2022-12-09 ENCOUNTER — Ambulatory Visit: Payer: BLUE CROSS/BLUE SHIELD | Attending: Cardiology

## 2022-12-09 DIAGNOSIS — Z954 Presence of other heart-valve replacement: Secondary | ICD-10-CM | POA: Diagnosis not present

## 2022-12-09 DIAGNOSIS — Z5181 Encounter for therapeutic drug level monitoring: Secondary | ICD-10-CM | POA: Diagnosis not present

## 2022-12-09 DIAGNOSIS — I480 Paroxysmal atrial fibrillation: Secondary | ICD-10-CM | POA: Diagnosis not present

## 2022-12-09 DIAGNOSIS — Z9889 Other specified postprocedural states: Secondary | ICD-10-CM

## 2022-12-09 LAB — POCT INR: INR: 3.3 — AB (ref 2.0–3.0)

## 2022-12-09 NOTE — Patient Instructions (Signed)
continue taking warfarin 1 tablet daily except for 1/2 tablet on Sundays and Wednesdays  Stay consistent with greens. Recheck INR in 1 weeks. Needs weekly INR checks for ablation on 12/18/22, per Dr. Elberta Fortis INR goal 2-3 for ablation. Coumadin Clinic [phone], call with any changes in medication or up coming  Ablation in May 2024

## 2022-12-16 ENCOUNTER — Other Ambulatory Visit (HOSPITAL_COMMUNITY): Payer: Self-pay | Admitting: Cardiology

## 2022-12-17 ENCOUNTER — Ambulatory Visit: Payer: BLUE CROSS/BLUE SHIELD | Attending: Cardiology

## 2022-12-17 DIAGNOSIS — I480 Paroxysmal atrial fibrillation: Secondary | ICD-10-CM

## 2022-12-17 DIAGNOSIS — Z5181 Encounter for therapeutic drug level monitoring: Secondary | ICD-10-CM

## 2022-12-17 DIAGNOSIS — Z954 Presence of other heart-valve replacement: Secondary | ICD-10-CM

## 2022-12-17 DIAGNOSIS — Z9889 Other specified postprocedural states: Secondary | ICD-10-CM

## 2022-12-17 LAB — POCT INR: INR: 3.5 — AB (ref 2.0–3.0)

## 2022-12-17 NOTE — Patient Instructions (Signed)
Description   Skip today's dosage of Warfarin, eat you something green and leafy today, then resume same dosage of warfarin 1 tablet daily except for 1/2 tablet on Sundays and Wednesdays  Stay consistent with greens. Recheck INR in 1 week after your ablation. Coumadin Clinic [phone], call with any changes in medication or up coming  Ablation in May 2024

## 2022-12-17 NOTE — Pre-Procedure Instructions (Signed)
Attempted to call patient regarding procedure instructions for tomorrow.  Mailbox full, unable to leave message.   Arrival time 5:15 Nothing to eat or drink after midnight No meds AM of procedure Responsible person to drive you home and stay with you for 24 hrs  Have you missed any doses of anti-coagulant Coumadin- should be taken daily,  if you have missed any doses please let office know.

## 2022-12-18 ENCOUNTER — Encounter (HOSPITAL_COMMUNITY)
Admission: RE | Disposition: A | Discharge status: Home / Self Care | Payer: BLUE CROSS/BLUE SHIELD | Source: Ambulatory Visit | Attending: Cardiology

## 2022-12-18 ENCOUNTER — Ambulatory Visit (HOSPITAL_COMMUNITY): Payer: BLUE CROSS/BLUE SHIELD | Admitting: Anesthesiology

## 2022-12-18 ENCOUNTER — Ambulatory Visit (HOSPITAL_COMMUNITY)
Admission: RE | Admit: 2022-12-18 | Discharge: 2022-12-18 | Disposition: A | Discharge status: Home / Self Care | Payer: BLUE CROSS/BLUE SHIELD | Source: Ambulatory Visit | Attending: Cardiology | Admitting: Cardiology

## 2022-12-18 DIAGNOSIS — I480 Paroxysmal atrial fibrillation: Secondary | ICD-10-CM

## 2022-12-18 DIAGNOSIS — Z87891 Personal history of nicotine dependence: Secondary | ICD-10-CM | POA: Diagnosis not present

## 2022-12-18 DIAGNOSIS — I4819 Other persistent atrial fibrillation: Secondary | ICD-10-CM

## 2022-12-18 DIAGNOSIS — Q231 Congenital insufficiency of aortic valve: Secondary | ICD-10-CM | POA: Diagnosis not present

## 2022-12-18 DIAGNOSIS — Z79899 Other long term (current) drug therapy: Secondary | ICD-10-CM | POA: Diagnosis not present

## 2022-12-18 DIAGNOSIS — I7121 Aneurysm of the ascending aorta, without rupture: Secondary | ICD-10-CM | POA: Diagnosis not present

## 2022-12-18 DIAGNOSIS — I483 Typical atrial flutter: Secondary | ICD-10-CM | POA: Diagnosis not present

## 2022-12-18 HISTORY — PX: ATRIAL FIBRILLATION ABLATION: EP1191

## 2022-12-18 LAB — PROTIME-INR
INR: 2 — ABNORMAL HIGH (ref 0.8–1.2)
Prothrombin Time: 22.7 seconds — ABNORMAL HIGH (ref 11.4–15.2)

## 2022-12-18 LAB — CBC
HCT: 36.7 % — ABNORMAL LOW (ref 39.0–52.0)
Hemoglobin: 11.1 g/dL — ABNORMAL LOW (ref 13.0–17.0)
MCH: 28.2 pg (ref 26.0–34.0)
MCHC: 30.2 g/dL (ref 30.0–36.0)
MCV: 93.4 fL (ref 80.0–100.0)
Platelets: 253 10*3/uL (ref 150–400)
RBC: 3.93 MIL/uL — ABNORMAL LOW (ref 4.22–5.81)
RDW: 18.6 % — ABNORMAL HIGH (ref 11.5–15.5)
WBC: 5.6 10*3/uL (ref 4.0–10.5)
nRBC: 0 % (ref 0.0–0.2)

## 2022-12-18 LAB — BASIC METABOLIC PANEL
Anion gap: 13 (ref 5–15)
BUN: 13 mg/dL (ref 6–20)
CO2: 25 mmol/L (ref 22–32)
Calcium: 8.9 mg/dL (ref 8.9–10.3)
Chloride: 103 mmol/L (ref 98–111)
Creatinine, Ser: 1.07 mg/dL (ref 0.61–1.24)
GFR, Estimated: >60 mL/min (ref >60)
Glucose, Bld: 104 mg/dL — ABNORMAL HIGH (ref 70–99)
Potassium: 3.7 mmol/L (ref 3.5–5.1)
Sodium: 141 mmol/L (ref 135–145)

## 2022-12-18 LAB — POCT ACTIVATED CLOTTING TIME: Activated Clotting Time: 293 seconds

## 2022-12-18 SURGERY — ATRIAL FIBRILLATION ABLATION
Anesthesia: General

## 2022-12-18 MED ORDER — ACETAMINOPHEN 325 MG PO TABS: 650.0000 mg | ORAL_TABLET | ORAL | Status: DC | PRN

## 2022-12-18 MED ORDER — WARFARIN SODIUM 10 MG PO TABS
10.0000 mg | ORAL_TABLET | Freq: Once | ORAL | Status: AC
  Administered 2022-12-18: 10 mg via ORAL
  Filled 2022-12-18: qty 1

## 2022-12-18 MED ORDER — ROCURONIUM BROMIDE 10 MG/ML (PF) SYRINGE
PREFILLED_SYRINGE | INTRAVENOUS | Status: DC | PRN
  Administered 2022-12-18: 70 mg via INTRAVENOUS

## 2022-12-18 MED ORDER — HEPARIN SODIUM (PORCINE) 1000 UNIT/ML IJ SOLN
INTRAMUSCULAR | Status: DC | PRN
  Administered 2022-12-18: 1000 [IU] via INTRAVENOUS

## 2022-12-18 MED ORDER — WARFARIN - PHARMACIST DOSING INPATIENT: Freq: Every day | Status: DC

## 2022-12-18 MED ORDER — SODIUM CHLORIDE 0.9 % IV SOLN: INTRAVENOUS | Status: DC

## 2022-12-18 MED ORDER — HEPARIN SODIUM (PORCINE) 1000 UNIT/ML IJ SOLN
INTRAMUSCULAR | Status: DC | PRN
  Administered 2022-12-18: 14000 [IU] via INTRAVENOUS
  Administered 2022-12-18: 3000 [IU] via INTRAVENOUS

## 2022-12-18 MED ORDER — HEPARIN (PORCINE) IN NACL 1000-0.9 UT/500ML-% IV SOLN
INTRAVENOUS | Status: DC | PRN
  Administered 2022-12-18 (×4): 500 mL

## 2022-12-18 MED ORDER — PROTAMINE SULFATE 10 MG/ML IV SOLN
INTRAVENOUS | Status: DC | PRN
  Administered 2022-12-18: 40 mg via INTRAVENOUS

## 2022-12-18 MED ORDER — FENTANYL CITRATE (PF) 250 MCG/5ML IJ SOLN
INTRAMUSCULAR | Status: DC | PRN
  Administered 2022-12-18 (×2): 50 ug via INTRAVENOUS

## 2022-12-18 MED ORDER — DEXAMETHASONE SODIUM PHOSPHATE 10 MG/ML IJ SOLN
INTRAMUSCULAR | Status: DC | PRN
  Administered 2022-12-18: 10 mg via INTRAVENOUS

## 2022-12-18 MED ORDER — LIDOCAINE 2% (20 MG/ML) 5 ML SYRINGE
INTRAMUSCULAR | Status: DC | PRN
  Administered 2022-12-18: 80 mg via INTRAVENOUS

## 2022-12-18 MED ORDER — HEPARIN SODIUM (PORCINE) 1000 UNIT/ML IJ SOLN
INTRAMUSCULAR | Status: AC
  Filled 2022-12-18: qty 10

## 2022-12-18 MED ORDER — PROPOFOL 10 MG/ML IV BOLUS
INTRAVENOUS | Status: DC | PRN
  Administered 2022-12-18: 200 mg via INTRAVENOUS

## 2022-12-18 MED ORDER — PHENYLEPHRINE HCL-NACL 20-0.9 MG/250ML-% IV SOLN
INTRAVENOUS | Status: DC | PRN
  Administered 2022-12-18: 15 ug/min via INTRAVENOUS

## 2022-12-18 MED ORDER — MIDAZOLAM HCL 5 MG/5ML IJ SOLN
INTRAMUSCULAR | Status: DC | PRN
  Administered 2022-12-18: 2 mg via INTRAVENOUS

## 2022-12-18 MED ORDER — WARFARIN SODIUM 10 MG PO TABS: 10.0000 mg | ORAL_TABLET | Freq: Once | ORAL | Status: DC

## 2022-12-18 MED ORDER — ONDANSETRON HCL 4 MG/2ML IJ SOLN
INTRAMUSCULAR | Status: DC | PRN
  Administered 2022-12-18: 4 mg via INTRAVENOUS

## 2022-12-18 MED ORDER — SUGAMMADEX SODIUM 200 MG/2ML IV SOLN
INTRAVENOUS | Status: DC | PRN
  Administered 2022-12-18: 200 mg via INTRAVENOUS

## 2022-12-18 MED ORDER — WARFARIN SODIUM 10 MG PO TABS
10.0000 mg | ORAL_TABLET | Freq: Once | ORAL | Status: DC
  Filled 2022-12-18: qty 1

## 2022-12-18 SURGICAL SUPPLY — 22 items
BAG SNAP BAND KOVER 36X36 (MISCELLANEOUS) IMPLANT
BLANKET WARM UNDERBOD FULL ACC (MISCELLANEOUS) ×1 IMPLANT
CATH 8FR REPROCESSED SOUNDSTAR (CATHETERS) ×1 IMPLANT
CATH 8FR SOUNDSTAR REPROCESSED (CATHETERS) IMPLANT
CATH ABLAT QDOT MICRO BI TC DF (CATHETERS) IMPLANT
CATH OCTARAY 2.0 F 3-3-3-3-3 (CATHETERS) IMPLANT
CATH PIGTAIL STEERABLE D1 8.7 (WIRE) IMPLANT
CATH S-M CIRCA TEMP PROBE (CATHETERS) IMPLANT
CATH WEBSTER BI DIR CS D-F CRV (CATHETERS) IMPLANT
CLOSURE MYNX CONTROL 6F/7F (Vascular Products) IMPLANT
CLOSURE PERCLOSE PROSTYLE (VASCULAR PRODUCTS) IMPLANT
COVER SWIFTLINK CONNECTOR (BAG) ×1 IMPLANT
PACK EP LATEX FREE (CUSTOM PROCEDURE TRAY) ×1
PACK EP LF (CUSTOM PROCEDURE TRAY) ×1 IMPLANT
PAD DEFIB RADIO PHYSIO CONN (PAD) ×1 IMPLANT
PATCH CARTO3 (PAD) IMPLANT
SHEATH CARTO VIZIGO SM CVD (SHEATH) IMPLANT
SHEATH PINNACLE 7F 10CM (SHEATH) IMPLANT
SHEATH PINNACLE 8F 10CM (SHEATH) IMPLANT
SHEATH PINNACLE 9F 10CM (SHEATH) IMPLANT
SHEATH PROBE COVER 6X72 (BAG) IMPLANT
TUBING SMART ABLATE COOLFLOW (TUBING) IMPLANT

## 2022-12-18 NOTE — Progress Notes (Addendum)
ANTICOAGULATION CONSULT NOTE  Pharmacy Consult for warfarin Indication:  mechanical AVR + Afib  Allergies  Allergen Reactions   Morphine And Codeine Nausea Only    Patient Measurements: Height: 5\' 8"  (172.7 cm) Weight: 79.4 kg (175 lb) IBW/kg (Calculated) : 68.4 Heparin Dosing Weight: 79.4 kg  Vital Signs: Temp: 98.9 F (37.2 C) (05/30 0556) Temp Source: Oral (05/30 0556) BP: 168/97 (05/30 0556) Pulse Rate: 66 (05/30 0556)  Labs: Recent Labs    12/17/22 1340 12/18/22 0637  HGB  --  11.1*  HCT  --  36.7*  PLT  --  253  LABPROT  --  22.7*  INR 3.5* 2.0*  CREATININE  --  1.07    Estimated Creatinine Clearance: 80.8 mL/min (by C-G formula based on SCr of 1.07 mg/dL).   Medical History: Past Medical History:  Diagnosis Date   Dysrhythmia    ETOH abuse    Heart murmur    Hx of repair of ascending aorta 09/27/2013   Non-ischemic cardiomyopathy (HCC)    PAF (paroxysmal atrial fibrillation) (HCC)    a. Dx 07/2013, s/p TEE/DCCV.   Paroxysmal atrial flutter (HCC)    a. Dx 07/2013.   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 Bicuspid aortic valve with severe AI, dilated ascending aorta dx 07/2013.   Systolic CHF (HCC)    a. Dx 07/2013: EF 25%, felt likely related to combo of EtOH + tachy-mediated + AI. LHC pending.    Medications:  Scheduled:   Assessment: [name] Hinton presenting for Afib ablation - on warfarin PTA for mechAVR + Afib (LD 5/28 AM).   PTA regimen is 5 mg daily except 2.5 mg Sun/Wed.  INR was 3.5 yesterday - pt was told to eat vitamin K heavy meal since on higher end of goal. Also held warfarin dose yesterday. INR today  subtherapeutic at 2. Hgb 11.1, plt 253. No s/sx of bleeding. No plans for enoxaparin bridge.  Goal of Therapy:  INR 2.5-3.5 Monitor platelets by anticoagulation protocol: Yes   Plan:  Warfarin 10 mg tonight to get into goal range  Resume PTA dosing on 5/31 Plan for INR check on Monday  Thank you for allowing pharmacy to participate in this patient's care,  Sherron Monday, PharmD, BCCCP Clinical Pharmacist  Phone: [phone] 12/18/2022 7:47 AM  Please check AMION for all [facility] phone numbers After 10:00 PM, call Main Pharmacy [phone]

## 2022-12-18 NOTE — Anesthesia Procedure Notes (Signed)
Procedure Name: Intubation Date/Time: 12/18/2022 7:42 AM  Performed by: Quentin Ore, CRNAPre-anesthesia Checklist: Patient identified, Emergency Drugs available, Suction available and Patient being monitored Patient Re-evaluated:Patient Re-evaluated prior to induction Oxygen Delivery Method: Circle system utilized Preoxygenation: Pre-oxygenation with 100% oxygen Induction Type: IV induction Ventilation: Mask ventilation without difficulty Laryngoscope Size: Mac and 4 Grade View: Grade I Tube type: Oral Tube size: 7.5 mm Number of attempts: 1 Airway Equipment and Method: Stylet Placement Confirmation: ETT inserted through vocal cords under direct vision, positive ETCO2 and breath sounds checked- equal and bilateral Secured at: 22 cm Tube secured with: Tape Dental Injury: Teeth and Oropharynx as per pre-operative assessment

## 2022-12-18 NOTE — Anesthesia Preprocedure Evaluation (Signed)
Anesthesia Evaluation  Patient identified by MRN, date of birth, ID band Patient awake    Reviewed: Allergy & Precautions, H&P , NPO status , Patient's Chart, lab work & pertinent test results  Airway Mallampati: II  TM Distance: >3 FB Neck ROM: Full    Dental no notable dental hx.    Pulmonary neg pulmonary ROS, former smoker   Pulmonary exam normal breath sounds clear to auscultation       Cardiovascular hypertension, +CHF  Normal cardiovascular exam+ dysrhythmias Atrial Fibrillation  Rhythm:Regular Rate:Normal   S/P Bentall aortic root replacement with mechanical valve conduit and maze procedure3/10/201529 mm Sorin Carbomedics Carbo-seal Valsalve mechanical valve conduitS/P Maze operation for atrial fibrillation3/10/2015Complete bilateral atrial lesion set using bipolar radiofrequency and cryothermy ablation with clipping of LA appendageHx of repair of ascending aorta3/10/2015Non-ischemic cardiomyopathy (HCC)    Neuro/Psych negative neurological ROS  negative psych ROS   GI/Hepatic negative GI ROS, Neg liver ROS,,,  Endo/Other  negative endocrine ROS    Renal/GU negative Renal ROS  negative genitourinary   Musculoskeletal negative musculoskeletal ROS (+)    Abdominal   Peds negative pediatric ROS (+)  Hematology negative hematology ROS (+)   Anesthesia Other Findings EF 50-55% 2023  Reproductive/Obstetrics negative OB ROS                             Anesthesia Physical Anesthesia Plan  ASA: 3  Anesthesia Plan: General   Post-op Pain Management: Minimal or no pain anticipated   Induction: Intravenous  PONV Risk Score and Plan: 2 and Ondansetron, Dexamethasone and Treatment may vary due to age or medical condition  Airway Management Planned: Oral ETT  Additional Equipment:   Intra-op Plan:   Post-operative Plan: Extubation in OR  Informed Consent: I have reviewed the  patients History and Physical, chart, labs and discussed the procedure including the risks, benefits and alternatives for the proposed anesthesia with the patient or authorized representative who has indicated his/her understanding and acceptance.     Dental advisory given  Plan Discussed with: CRNA and Surgeon  Anesthesia Plan Comments:        Anesthesia Quick Evaluation

## 2022-12-18 NOTE — Discharge Instructions (Addendum)
Cardiac Ablation, Care After  This sheet gives you information about how to care for yourself after your procedure. Your health care provider may also give you more specific instructions. If you have problems or questions, contact your health care provider. What can I expect after the procedure? After the procedure, it is common to have: Bruising around your puncture site. Tenderness around your puncture site. Skipped heartbeats. If you had an atrial fibrillation ablation, you may have atrial fibrillation during the first several months after your procedure.  Tiredness (fatigue).  Follow these instructions at home: Puncture site care  Follow instructions from your health care provider about how to take care of your puncture site. Make sure you: If present, leave stitches (sutures), skin glue, or adhesive strips in place. These skin closures may need to stay in place for up to 2 weeks. If adhesive strip edges start to loosen and curl up, you may trim the loose edges. Do not remove adhesive strips completely unless your health care provider tells you to do that. If a large square bandage is present, this may be removed 24 hours after surgery.  Check your puncture site every day for signs of infection. Check for: Redness, swelling, or pain. Fluid or blood. If your puncture site starts to bleed, lie down on your back, apply firm pressure to the area, and contact your health care provider. Warmth. Pus or a bad smell. A pea or small marble sized lump at the site is normal and can take up to three months to resolve.  Driving Do not drive for at least 4 days after your procedure or however long your health care provider recommends. (Do not resume driving if you have previously been instructed not to drive for other health reasons.) Do not drive or use heavy machinery while taking prescription pain medicine. Activity Avoid activities that take a lot of effort for at least 7 days after your  procedure. Do not lift anything that is heavier than 5 lb (4.5 kg) for one week.  No sexual activity for 1 week.  Return to your normal activities as told by your health care provider. Ask your health care provider what activities are safe for you. General instructions Take over-the-counter and prescription medicines only as told by your health care provider. Do not use any products that contain nicotine or tobacco, such as cigarettes and e-cigarettes. If you need help quitting, ask your health care provider. You may shower after 24 hours, but Do not take baths, swim, or use a hot tub for 1 week.  Do not drink alcohol for 24 hours after your procedure. Keep all follow-up visits as told by your health care provider. This is important. Contact a health care provider if: You have redness, mild swelling, or pain around your puncture site. You have fluid or blood coming from your puncture site that stops after applying firm pressure to the area. Your puncture site feels warm to the touch. You have pus or a bad smell coming from your puncture site. You have a fever. You have chest pain or discomfort that spreads to your neck, jaw, or arm. You have chest pain that is worse with lying on your back or taking a deep breath. You are sweating a lot. You feel nauseous. You have a fast or irregular heartbeat. You have shortness of breath. You are dizzy or light-headed and feel the need to lie down. You have pain or numbness in the arm or leg closest to your puncture  site. Get help right away if: Your puncture site suddenly swells. Your puncture site is bleeding and the bleeding does not stop after applying firm pressure to the area. These symptoms may represent a serious problem that is an emergency. Do not wait to see if the symptoms will go away. Get medical help right away. Call your local emergency services (911 in the [place] Do not drive yourself to the hospital. Summary After the procedure, it  is normal to have bruising and tenderness at the puncture site in your groin, neck, or forearm. Check your puncture site every day for signs of infection. Get help right away if your puncture site is bleeding and the bleeding does not stop after applying firm pressure to the area. This is a medical emergency. This information is not intended to replace advice given to you by your health care provider. Make sure you discuss any questions you have with your health care provider.    You have an appointment set up with the [facility]illation Clinic.  Multiple studies have shown that being followed by a dedicated atrial fibrillation clinic in addition to the standard care you receive from your other physicians improves health. We believe that enrollment in the atrial fibrillation clinic will allow [place] to better care for you.   The phone number to the Atrial Fibrillation Clinic is [phone]. The clinic is staffed Monday through Friday from 8:30am to 5pm.  Directions: The clinic is located in the [facility], 6TH FLOOR Enter the hospital at the MAIN ENTRANCE "A", use [employer] to the 6th floor.  Registration desk to the right of elevators on 6th floor  If you have any trouble locating the clinic, please don't hesitate to call [phone].  Femoral Site Care This sheet gives you information about how to care for yourself after your procedure. Your health care provider may also give you more specific instructions. If you have problems or questions, contact your health care provider. What can I expect after the procedure?  After the procedure, it is common to have: Bruising that usually fades within 1-2 weeks. Tenderness at the site. Follow these instructions at home: Wound care Follow instructions from your health care provider about how to take care of your insertion site. Make sure you: Wash your hands with soap and water before you change your bandage (dressing). If soap  and water are not available, use hand sanitizer. Remove your dressing as told by your health care provider. In 24 hours Do not take baths, swim, or use a hot tub until your health care provider approves. You may shower 24-48 hours after the procedure or as told by your health care provider. Gently wash the site with plain soap and water. Pat the area dry with a clean towel. Do not rub the site. This may cause bleeding. Do not apply powder or lotion to the site. Keep the site clean and dry. Check your femoral site every day for signs of infection. Check for: Redness, swelling, or pain. Fluid or blood. Warmth. Pus or a bad smell. Activity For the first 2-3 days after your procedure, or as long as directed: Avoid climbing stairs as much as possible. Do not squat. Do not lift anything that is heavier than 10 lb (4.5 kg), or the limit that you are told, until your health care provider says that it is safe. For 5 days Rest as directed. Avoid sitting for a long time without moving. Get up to take short walks  every 1-2 hours. Do not drive for 24 hours if you were given a medicine to help you relax (sedative). General instructions Take over-the-counter and prescription medicines only as told by your health care provider. Keep all follow-up visits as told by your health care provider. This is important. Contact a health care provider if you have: A fever or chills. You have redness, swelling, or pain around your insertion site. Get help right away if: The catheter insertion area swells very fast. You pass out. You suddenly start to sweat or your skin gets clammy. The catheter insertion area is bleeding, and the bleeding does not stop when you hold steady pressure on the area. The area near or just beyond the catheter insertion site becomes pale, cool, tingly, or numb. These symptoms may represent a serious problem that is an emergency. Do not wait to see if the symptoms will go away. Get  medical help right away. Call your local emergency services (911 in the [place] Do not drive yourself to the hospital. Summary After the procedure, it is common to have bruising that usually fades within 1-2 weeks. Check your femoral site every day for signs of infection. Do not lift anything that is heavier than 10 lb (4.5 kg), or the limit that you are told, until your health care provider says that it is safe. This information is not intended to replace advice given to you by your health care provider. Make sure you discuss any questions you have with your health care provider. Document Revised: 07/20/2017 Document Reviewed: 07/20/2017 Elsevier Patient Education  2020 [employer].  You have an appointment set up with the At[facility] Fibrillation Clinic.  Multiple studies have shown that being followed by a dedicated atrial fibrillation clinic in addition to the standard care you receive from your other physicians improves health. We believe that enrollment in the atrial fibrillation clinic will allow [place] to better care for you.   The phone number to the Atrial Fibrillation Clinic is [phone]. The clinic is staffed Monday through Friday from 8:30am to 5pm.  Directions: The clinic is located in the [facility], 6TH FLOOR Enter the hospital at the MAIN ENTRANCE "A", use [employer] to the 6th floor.  Registration desk to the right of elevators on 6th floor  If you have any trouble locating the clinic, please don't hesitate to call [phone].

## 2022-12-18 NOTE — Anesthesia Postprocedure Evaluation (Signed)
Anesthesia Post Note  Patient: [name] Hinton  Procedure(s) Performed: ATRIAL FIBRILLATION ABLATION     Patient location during evaluation: PACU Anesthesia Type: General Level of consciousness: awake and alert Pain management: pain level controlled Vital Signs Assessment: post-procedure vital signs reviewed and stable Respiratory status: spontaneous breathing, nonlabored ventilation, respiratory function stable and patient connected to nasal cannula oxygen Cardiovascular status: blood pressure returned to baseline and stable Postop Assessment: no apparent nausea or vomiting Anesthetic complications: no  There were no known notable events for this encounter.  Last Vitals:  Vitals:   12/18/22 0556 12/18/22 0923  BP: (!) 168/97   Pulse: 66   Resp: 18   Temp: 37.2 C 36.8 C  SpO2: 95%     Last Pain:  Vitals:   12/18/22 0923  TempSrc: Temporal  PainSc: 0-No pain                 Kamyah Wilhelmsen S

## 2022-12-18 NOTE — Transfer of Care (Signed)
Immediate Anesthesia Transfer of Care Note  Patient: [name] Hinton  Procedure(s) Performed: ATRIAL FIBRILLATION ABLATION  Patient Location: Cath Lab  Anesthesia Type:General  Level of Consciousness: awake, alert , and oriented  Airway & Oxygen Therapy: Patient Spontanous Breathing and Patient connected to nasal cannula oxygen  Post-op Assessment: Report given to RN and Post -op Vital signs reviewed and stable  Post vital signs: Reviewed and stable  Last Vitals:  Vitals Value Taken Time  BP 113/61 12/18/22 0940  Temp 36.8 C 12/18/22 0923  Pulse 62 12/18/22 0943  Resp 9 12/18/22 0943  SpO2 97 % 12/18/22 0943  Vitals shown include unvalidated device data.  Last Pain:  Vitals:   12/18/22 0923  TempSrc: Temporal  PainSc: 0-No pain         Complications: There were no known notable events for this encounter.

## 2022-12-18 NOTE — H&P (Signed)
Electrophysiology Office Note   Date:  12/18/2022   ID:  [name] Hinton, [name] Hinton [birth year], MRN [id number]  PCP:  Georgianne Fick, MD  Cardiologist:  Shirlee Latch Primary Electrophysiologist:  Nadja Lina Jorja Loa, MD    Chief Complaint: atrial flutter   History of Present Illness: [name] Hinton is a 52 y.o. male who is being seen today for the evaluation of atrial flutter at the request of No ref. provider found. Presenting today for electrophysiology evaluation.  Is a history seen for alcohol abuse, tobacco abuse.  He was hospitalized in 2015 with heart failure and severe aortic insufficiency.  He was found to have atrial flutter.  He had a bicuspid aortic valve and ascending aortic aneurysm.  He is post Ship broker procedure March 2015.  He had a maze at the time and left a later appendage occlusion.  He has had episodes of atypical atrial flutter.  He is on amiodarone.  2023 he went back into atrial flutter.  He had a TEE and cardioversion for atypical atrial flutter.  Amiodarone was started.  He had initially plans for ablation, but his INR was too high.  Ablation has been rescheduled for 12/18/2022.  Today, denies symptoms of palpitations, chest pain, shortness of breath, orthopnea, PND, lower extremity edema, claudication, dizziness, presyncope, syncope, bleeding, or neurologic sequela. The patient is tolerating medications without difficulties. Plan ablation today.    Past Medical History:  Diagnosis Date   Dysrhythmia    ETOH abuse    Heart murmur    Hx of repair of ascending aorta 09/27/2013   Non-ischemic cardiomyopathy (HCC)    PAF (paroxysmal atrial fibrillation) (HCC)    a. Dx 07/2013, s/p TEE/DCCV.   Paroxysmal atrial flutter (HCC)    a. Dx 07/2013.   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 Bicuspid aortic valve with severe AI, dilated ascending aorta dx 07/2013.   Systolic CHF (HCC)    a. Dx 07/2013: EF 25%, felt likely related to combo of EtOH + tachy-mediated + AI. LHC pending.   Past Surgical History:  Procedure Laterality Date   AORTIC VALVE REPLACEMENT  2015   (using a Carbo medics Carbo-Seal Valsalva, valve size 29 mm)   ASCENDING AORTIC ROOT REPLACEMENT N/A 09/27/2013   Procedure: ASCENDING AORTIC ROOT REPLACEMENT;  Surgeon: Purcell Nails, MD;  Location: [facility] OR;  Service: Open Heart Surgery;  Laterality: N/A;   BENTALL PROCEDURE N/A 09/27/2013   Procedure: BENTALL PROCEDURE;  Surgeon: Purcell Nails, MD;  Location: [facility] OR;  Service: Open Heart Surgery;  Laterality: N/A;   CARDIAC CATHETERIZATION     CARDIOVERSION N/A 08/01/2013   Procedure: CARDIOVERSION;  Surgeon: Laurey Morale, MD;  Location: [facility] ENDOSCOPY;  Service: Cardiovascular;  Laterality: N/A;   CARDIOVERSION N/A 11/15/2021   Procedure: CARDIOVERSION;  Surgeon: Laurey Morale, MD;  Location: [facility] ENDOSCOPY;  Service: Cardiovascular;  Laterality: N/A;   CARDIOVERSION N/A 02/14/2022   Procedure: CARDIOVERSION;  Surgeon: Laurey Morale, MD;  Location: [facility] ENDOSCOPY;  Service: Cardiovascular;  Laterality: N/A;   CARDIOVERSION N/A 03/10/2022   Procedure: CARDIOVERSION;  Surgeon: Laurey Morale, MD;  Location: [facility] ENDOSCOPY;  Service: Cardiovascular;  Laterality: N/A;  COLONOSCOPY WITH PROPOFOL Left 10/10/2016   Procedure: COLONOSCOPY WITH PROPOFOL;  Surgeon: Willis Modena, MD;  Location: [facility] ENDOSCOPY;  Service: Endoscopy;  Laterality: Left;   INTRAOPERATIVE TRANSESOPHAGEAL ECHOCARDIOGRAM N/A 09/27/2013   Procedure: INTRAOPERATIVE TRANSESOPHAGEAL ECHOCARDIOGRAM;  Surgeon: Purcell Nails, MD;  Location: [facility] OR;  Service: Open Heart Surgery;  Laterality: N/A;    LEFT AND RIGHT HEART CATHETERIZATION WITH CORONARY ANGIOGRAM N/A 09/05/2013   Procedure: LEFT AND RIGHT HEART CATHETERIZATION WITH CORONARY ANGIOGRAM;  Surgeon: Laurey Morale, MD;  Location: [facility] CATH LAB;  Service: Cardiovascular;  Laterality: N/A;   MAZE N/A 09/27/2013   Procedure: MAZE;  Surgeon: Purcell Nails, MD;  Location: [facility] OR;  Service: Open Heart Surgery;  Laterality: N/A;   NO PAST SURGERIES     TEE WITHOUT CARDIOVERSION N/A 08/01/2013   Procedure: TRANSESOPHAGEAL ECHOCARDIOGRAM (TEE);  Surgeon: Laurey Morale, MD;  Location: [facility] ENDOSCOPY;  Service: Cardiovascular;  Laterality: N/A;   TEE WITHOUT CARDIOVERSION N/A 11/15/2021   Procedure: TRANSESOPHAGEAL ECHOCARDIOGRAM (TEE);  Surgeon: Laurey Morale, MD;  Location: [facility] ENDOSCOPY;  Service: Cardiovascular;  Laterality: N/A;   TEE WITHOUT CARDIOVERSION N/A 03/10/2022   Procedure: TRANSESOPHAGEAL ECHOCARDIOGRAM (TEE);  Surgeon: Laurey Morale, MD;  Location: [facility] ENDOSCOPY;  Service: Cardiovascular;  Laterality: N/A;     Current Facility-Administered Medications  Medication Dose Route Frequency Provider Last Rate Last Admin   0.9 %  sodium chloride infusion   Intravenous Continuous Regan Lemming, MD 50 mL/hr at 12/18/22 0628 New Bag at 12/18/22 0628    Allergies:   Morphine and codeine   Social History:  The patient  reports that he quit smoking about 13 years ago. His smoking use included cigarettes. He has a 10.00 pack-year smoking history. He has never used smokeless tobacco. He reports that he does not currently use alcohol. He reports that he does not use drugs.   Family History:  The patient's family history includes Coronary artery disease in his father; Heart failure in his father.   ROS:  Please see the history of present illness.   Otherwise, review of systems is positive for none.   All other systems are reviewed and negative.   PHYSICAL EXAM: VS:  BP (!) 168/97   Pulse 66   Temp 98.9 F (37.2 C) (Oral)    Resp 18   Ht 5\' 8"  (1.727 m)   Wt 79.4 kg   SpO2 95%   BMI 26.61 kg/m  , BMI Body mass index is 26.61 kg/m. GEN: Well nourished, well developed, in no acute distress  HEENT: normal  Neck: no JVD, carotid bruits, or masses Cardiac: RRR; no murmurs, rubs, or gallops,no edema  Respiratory:  clear to auscultation bilaterally, normal work of breathing GI: soft, nontender, nondistended, + BS MS: no deformity or atrophy  Skin: warm and dry Neuro:  Strength and sensation are intact Psych: euthymic mood, full affect   Recent Labs: 02/12/2022: B Natriuretic Peptide 398.9 06/26/2022: TSH 0.922 07/06/2022: ALT 14 11/10/2022: BUN 13; Creatinine, Ser 0.93; Potassium 4.8; Sodium 140 12/18/2022: Hemoglobin 11.1; Platelets 253    Lipid Panel     Component Value Date/Time   CHOL 212 (H) 07/04/2021 1552   TRIG 71 07/04/2021 1552   HDL 82 07/04/2021 1552   CHOLHDL 2.6 07/04/2021 1552   VLDL 14 07/04/2021 1552   LDLCALC 116 (H) 07/04/2021 1552     Wt Readings from Last 3 Encounters:  12/18/22 79.4 kg  11/10/22 83 kg  07/30/22 81.1 kg  Other studies Reviewed: Additional studies/ records that were reviewed today include: TEE 12/16/21  Review of the above records today demonstrates:   1. Left ventricular ejection fraction, by estimation, is 50 to 55%. The  left ventricle has low normal function. The left ventricle has no regional  wall motion abnormalities.   2. Right ventricular systolic function is normal. The right ventricular  size is normal. Tricuspid regurgitation signal is inadequate for assessing  PA pressure.   3. The LA appendage has been ligated, no thrombus in the LA. Left atrial  size was mildly dilated. No left atrial/left atrial appendage thrombus was  detected.   4. No PFO/ASD by color doppler.   5. The mitral valve is normal in structure. Trivial mitral valve  regurgitation. No evidence of mitral stenosis.   6. Mechanical aortic valve without significant  regurgitation, mean  gradient 7 mmHg.   7. 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 Normal caliber  thoracic aorta with minimal plaque.    ASSESSMENT AND PLAN:  1.  Persistent atrial fibrillation/flutter: ANDRICK GURKIN has presented today for surgery, with the diagnosis of AF.  The various methods of treatment have been discussed with the patient and family. After consideration of risks, benefits and other options for treatment, the patient has consented to  Procedure(s): Catheter ablation as a surgical intervention .  Risks include but not limited to complete heart block, stroke, esophageal damage, nerve damage, bleeding, vascular damage, tamponade, perforation, MI, and death. The patient's history has been reviewed, patient examined, no change in status, stable for surgery.  I have reviewed the patient's chart and labs.  Questions were answered to the patient's satisfaction.    Lodie Waheed Elberta Fortis, MD 12/18/2022 7:05 AM

## 2022-12-19 ENCOUNTER — Encounter (HOSPITAL_COMMUNITY): Payer: Self-pay | Admitting: Cardiology

## 2022-12-22 ENCOUNTER — Ambulatory Visit (INDEPENDENT_AMBULATORY_CARE_PROVIDER_SITE_OTHER): Payer: BLUE CROSS/BLUE SHIELD | Admitting: *Deleted

## 2022-12-22 DIAGNOSIS — Z5181 Encounter for therapeutic drug level monitoring: Secondary | ICD-10-CM

## 2022-12-22 DIAGNOSIS — Z9889 Other specified postprocedural states: Secondary | ICD-10-CM | POA: Diagnosis not present

## 2022-12-22 DIAGNOSIS — Z954 Presence of other heart-valve replacement: Secondary | ICD-10-CM

## 2022-12-22 DIAGNOSIS — I480 Paroxysmal atrial fibrillation: Secondary | ICD-10-CM

## 2022-12-22 LAB — POCT INR: POC INR: 4.6

## 2022-12-22 NOTE — Patient Instructions (Addendum)
Description   Hold warfarin tomorrow and then continue to take warfarin 1 tablet daily except for 1/2 a tablet on Sunday and Wednesday. Recheck INR in 2 weeks.

## 2022-12-24 ENCOUNTER — Telehealth: Payer: Self-pay | Admitting: Cardiology

## 2022-12-24 NOTE — Telephone Encounter (Signed)
Patient said that he had an ablation last week and noticed some swelling from the groin area where they put in the catheter. Wanted to let Dr. Elberta Fortis and nurse aware and to call back

## 2022-12-24 NOTE — Telephone Encounter (Signed)
Spoke with pt who complains of two tender, hard, nickel size "knots" one above and one below the incision site of his left groin.  Some mild bruising.  Pt denies swelling, redness or drainage.  He reports right side is healing without any problems.  He has not applied heat, cold or any type of lotion or ointment to the site. Pt advised Dr Elberta Fortis id not in the office today but will forward for further review and recommendation.  Reviewed ED/On-Call provider precautions.  Pt verbalizes understanding and agrees with current plan.

## 2022-12-25 NOTE — Telephone Encounter (Signed)
Spoke with patient to review recommendations from Dr Elberta Fortis and answer questions. Patient verbalized understanding and had no questions.

## 2023-01-05 ENCOUNTER — Ambulatory Visit: Payer: BLUE CROSS/BLUE SHIELD | Attending: Cardiology

## 2023-01-05 DIAGNOSIS — Z9889 Other specified postprocedural states: Secondary | ICD-10-CM

## 2023-01-05 DIAGNOSIS — I480 Paroxysmal atrial fibrillation: Secondary | ICD-10-CM

## 2023-01-05 DIAGNOSIS — Z954 Presence of other heart-valve replacement: Secondary | ICD-10-CM

## 2023-01-05 DIAGNOSIS — Z5181 Encounter for therapeutic drug level monitoring: Secondary | ICD-10-CM | POA: Diagnosis not present

## 2023-01-05 LAB — POCT INR: INR: 3.1 — AB (ref 2.0–3.0)

## 2023-01-05 NOTE — Patient Instructions (Signed)
continue to take warfarin 1 tablet daily except for 1/2 a tablet on Sunday and Wednesday. Recheck INR in 4 weeks.

## 2023-01-15 ENCOUNTER — Ambulatory Visit (HOSPITAL_COMMUNITY): Payer: BLUE CROSS/BLUE SHIELD | Admitting: Physician Assistant

## 2023-02-02 ENCOUNTER — Telehealth: Payer: Self-pay

## 2023-02-02 ENCOUNTER — Ambulatory Visit: Payer: BLUE CROSS/BLUE SHIELD | Attending: Cardiology

## 2023-02-02 NOTE — Telephone Encounter (Signed)
Pt missed scheduled [facility] appt. Called to reschedule, no answer. Left message on voicemail.

## 2023-03-05 ENCOUNTER — Other Ambulatory Visit: Payer: Self-pay | Admitting: Cardiology

## 2023-03-05 ENCOUNTER — Ambulatory Visit: Payer: BLUE CROSS/BLUE SHIELD | Attending: Cardiology

## 2023-03-05 DIAGNOSIS — Z9889 Other specified postprocedural states: Secondary | ICD-10-CM

## 2023-03-05 DIAGNOSIS — Z5181 Encounter for therapeutic drug level monitoring: Secondary | ICD-10-CM | POA: Diagnosis not present

## 2023-03-05 DIAGNOSIS — Z954 Presence of other heart-valve replacement: Secondary | ICD-10-CM | POA: Diagnosis not present

## 2023-03-05 DIAGNOSIS — I480 Paroxysmal atrial fibrillation: Secondary | ICD-10-CM | POA: Diagnosis not present

## 2023-03-05 LAB — POCT INR: INR: 3.7 — AB (ref 2.0–3.0)

## 2023-03-05 NOTE — Patient Instructions (Signed)
Description   Take 1/2 tablet today, then resume same dosage of Warfarin 1 tablet daily except for 1/2 a tablet on Sundays and Wednesdays. Recheck INR in 4 weeks.

## 2023-03-17 ENCOUNTER — Other Ambulatory Visit: Payer: Self-pay | Admitting: Cardiology

## 2023-03-17 DIAGNOSIS — Q231 Congenital insufficiency of aortic valve: Secondary | ICD-10-CM

## 2023-03-17 DIAGNOSIS — I480 Paroxysmal atrial fibrillation: Secondary | ICD-10-CM

## 2023-03-17 NOTE — Telephone Encounter (Signed)
Refill request for warfarin:  Last INR was 3.7 on 03/05/23 Next INR due 04/02/23 LOV was 11/10/22  Refill approved.

## 2023-03-20 ENCOUNTER — Encounter: Payer: Self-pay | Admitting: Cardiology

## 2023-03-20 ENCOUNTER — Ambulatory Visit: Payer: BLUE CROSS/BLUE SHIELD | Attending: Cardiology | Admitting: Cardiology

## 2023-03-20 VITALS — BP 150/100 | HR 60 | Ht 68.0 in | Wt 187.0 lb

## 2023-03-20 DIAGNOSIS — G4733 Obstructive sleep apnea (adult) (pediatric): Secondary | ICD-10-CM | POA: Diagnosis not present

## 2023-03-20 DIAGNOSIS — I10 Essential (primary) hypertension: Secondary | ICD-10-CM | POA: Diagnosis not present

## 2023-03-20 DIAGNOSIS — I480 Paroxysmal atrial fibrillation: Secondary | ICD-10-CM | POA: Diagnosis not present

## 2023-03-20 DIAGNOSIS — D6869 Other thrombophilia: Secondary | ICD-10-CM | POA: Diagnosis not present

## 2023-03-20 NOTE — Progress Notes (Signed)
Electrophysiology Office Note:   Date:  03/20/2023  ID:  [name] Hinton, DOB 12/28/1970, MRN [id number]  Primary Cardiologist: Marca Ancona, MD Electrophysiologist: Cass Vandermeulen Jorja Loa, MD      History of Present Illness:   [name] Hinton is a 52 y.o. male with h/o atrial fibrillation/flutter, alcohol, tobacco abuse seen today for routine electrophysiology followup.  Since last being seen in our clinic the patient reports doing well.  He has noted no further episodes of atrial fibrillation.  He is continue to do all of his daily activities.  His blood pressure is elevated today.  He thinks that it is due to his diet.  He is going to work on improving this.  At home, it is usually less than 130/80.  he denies chest pain, palpitations, dyspnea, PND, orthopnea, nausea, vomiting, dizziness, syncope, edema, weight gain, or early satiety.      Is a history seen for alcohol abuse, tobacco abuse.  He was hospitalized in 2015 with heart failure and severe aortic insufficiency.  He was found to have atrial flutter.  He had a bicuspid aortic valve and ascending aortic aneurysm.  He is post Ship broker procedure March 2015.  He had a maze at the time and left a later appendage occlusion.  He has had episodes of atypical atrial flutter.  He is on amiodarone.   2023 he went back into atrial flutter.  He had a TEE and cardioversion for atypical atrial flutter.  Amiodarone was started.  He had initially plans for ablation, but his INR was too high.  He is post ablation 12/18/2022.       Review of systems complete and found to be negative unless listed in HPI.   EP Information / Studies Reviewed:    EKG is ordered today. Personal review as below.  EKG Interpretation Date/Time:  Friday March 20 2023 13:30:32 EDT Ventricular Rate:  60 PR Interval:  192 QRS Duration:  110 QT Interval:  482 QTC Calculation: 482 R Axis:   52  Text Interpretation: Normal sinus rhythm Incomplete left bundle branch block ST &  T wave abnormality, consider lateral ischemia Prolonged QT When compared with ECG of 18-Dec-2022 09:38, T wave inversion less evident in Anterolateral leads Confirmed by Loralai Eisman ([id number]) on 03/20/2023 1:32:51 PM     Risk Assessment/Calculations:    CHA2DS2-VASc Score = 2   This indicates a 2.2% annual risk of stroke. The patient's score is based upon: CHF History: 1 HTN History: 1 Diabetes History: 0 Stroke History: 0 Vascular Disease History: 0 Age Score: 0 Gender Score: 0            Physical Exam:   VS:  BP (!) 150/100   Pulse 60   Ht 5\' 8"  (1.727 m)   Wt 187 lb (84.8 kg)   SpO2 97%   BMI 28.43 kg/m    Wt Readings from Last 3 Encounters:  03/20/23 187 lb (84.8 kg)  12/18/22 175 lb (79.4 kg)  11/10/22 183 lb (83 kg)     GEN: Well nourished, well developed in no acute distress NECK: No JVD; No carotid bruits CARDIAC: Regular rate and rhythm, no murmurs, rubs, gallops RESPIRATORY:  Clear to auscultation without rales, wheezing or rhonchi  ABDOMEN: Soft, non-tender, non-distended EXTREMITIES:  No edema; No deformity   ASSESSMENT AND PLAN:    1.  Persistent atrial fibrillation/flutter: Currently on warfarin.  Status post ablation 12/18/2022.  He has remained in sinus rhythm.  Feeling well.  [name] Hinton stop  amiodarone today.  2.  Chronic systolic heart failure: Due to nonischemic cardiomyopathy.  Thought tachycardia mediated.  Management per primary cardiology.  3.  Bicuspid aortic valve with severe AI: Post Bentall procedure.  Continue warfarin.  Plan per primary cardiology.  4.  Alcohol abuse: Complete cessation encouraged  5.  Hypertension: Elevated today.  It is in the 120s over 80s at home.  [name] Hinton continue with current management.  6.  Secondary hypercoagulable state: Currently on warfarin for atrial fibrillation and aortic valve  7.  Obstructive sleep apnea: CPAP compliance encouraged  Follow up with Afib Clinic in 6 months  Signed, Ryann Pauli Jorja Loa, MD

## 2023-03-20 NOTE — Patient Instructions (Signed)
Medication Instructions:  Your physician has recommended you make the following change in your medication: STOP Amiodarone  *If you need a refill on your cardiac medications before your next appointment, please call your pharmacy*   Lab Work: None ordered   Testing/Procedures: None ordered   Follow-Up: At [facility], you and your health needs are our priority.  As part of our continuing mission to provide you with exceptional heart care, we have created designated Provider Care Teams.  These Care Teams include your primary Cardiologist (physician) and Advanced Practice Providers (APPs -  Physician Assistants and Nurse Practitioners) who all work together to provide you with the care you need, when you need it.  Your next appointment:   6 month(s)  The format for your next appointment:   In Person  Provider:   You will follow up in the Atrial Fibrillation Clinic located at [facility]. Your provider will be: Clint R. Fenton, PA-C  Or Landry Mellow, PA  Thank you for choosing [facility]!!   Dory Horn, RN [phone]

## 2023-04-02 ENCOUNTER — Other Ambulatory Visit (HOSPITAL_COMMUNITY): Payer: Self-pay | Admitting: Family Medicine

## 2023-04-02 ENCOUNTER — Ambulatory Visit: Payer: BLUE CROSS/BLUE SHIELD | Attending: Internal Medicine | Admitting: *Deleted

## 2023-04-02 DIAGNOSIS — Z9889 Other specified postprocedural states: Secondary | ICD-10-CM

## 2023-04-02 DIAGNOSIS — Z954 Presence of other heart-valve replacement: Secondary | ICD-10-CM

## 2023-04-02 DIAGNOSIS — Z5181 Encounter for therapeutic drug level monitoring: Secondary | ICD-10-CM

## 2023-04-02 DIAGNOSIS — I480 Paroxysmal atrial fibrillation: Secondary | ICD-10-CM | POA: Diagnosis not present

## 2023-04-02 LAB — POCT INR: INR: 3.6 — AB (ref 2.0–3.0)

## 2023-04-02 NOTE — Patient Instructions (Signed)
Description   Take 1/2 tablet tomorrow then continue taking Warfarin 1 tablet daily except for 1/2 tablet on Sundays and Wednesdays. Recheck INR in 4 weeks. [phone]

## 2023-04-30 ENCOUNTER — Ambulatory Visit: Payer: BLUE CROSS/BLUE SHIELD | Attending: Internal Medicine

## 2023-04-30 DIAGNOSIS — I480 Paroxysmal atrial fibrillation: Secondary | ICD-10-CM

## 2023-04-30 DIAGNOSIS — Z5181 Encounter for therapeutic drug level monitoring: Secondary | ICD-10-CM

## 2023-04-30 DIAGNOSIS — Z954 Presence of other heart-valve replacement: Secondary | ICD-10-CM

## 2023-04-30 DIAGNOSIS — Z9889 Other specified postprocedural states: Secondary | ICD-10-CM | POA: Diagnosis not present

## 2023-04-30 LAB — POCT INR: INR: 2.5 (ref 2.0–3.0)

## 2023-04-30 NOTE — Patient Instructions (Signed)
Description   Take an extra 1/2 tablet today and then continue taking Warfarin 1 tablet daily except for 1/2 tablet on Sundays and Wednesdays. Recheck INR in 5 weeks. [phone]

## 2023-05-03 ENCOUNTER — Emergency Department (HOSPITAL_BASED_OUTPATIENT_CLINIC_OR_DEPARTMENT_OTHER)
Admission: EM | Admit: 2023-05-03 | Discharge: 2023-05-03 | Disposition: A | Discharge status: Home / Self Care | Payer: BLUE CROSS/BLUE SHIELD | Attending: Emergency Medicine | Admitting: Emergency Medicine

## 2023-05-03 ENCOUNTER — Encounter (HOSPITAL_BASED_OUTPATIENT_CLINIC_OR_DEPARTMENT_OTHER): Payer: Self-pay | Admitting: Emergency Medicine

## 2023-05-03 ENCOUNTER — Other Ambulatory Visit: Payer: Self-pay

## 2023-05-03 ENCOUNTER — Emergency Department (HOSPITAL_BASED_OUTPATIENT_CLINIC_OR_DEPARTMENT_OTHER): Payer: BLUE CROSS/BLUE SHIELD | Admitting: Radiology

## 2023-05-03 DIAGNOSIS — Z7901 Long term (current) use of anticoagulants: Secondary | ICD-10-CM | POA: Insufficient documentation

## 2023-05-03 DIAGNOSIS — J189 Pneumonia, unspecified organism: Secondary | ICD-10-CM

## 2023-05-03 DIAGNOSIS — Z1152 Encounter for screening for COVID-19: Secondary | ICD-10-CM | POA: Diagnosis not present

## 2023-05-03 DIAGNOSIS — R509 Fever, unspecified: Secondary | ICD-10-CM

## 2023-05-03 DIAGNOSIS — J181 Lobar pneumonia, unspecified organism: Secondary | ICD-10-CM | POA: Diagnosis not present

## 2023-05-03 DIAGNOSIS — Z7982 Long term (current) use of aspirin: Secondary | ICD-10-CM | POA: Insufficient documentation

## 2023-05-03 LAB — CBC WITH DIFFERENTIAL/PLATELET
Abs Immature Granulocytes: 0.05 10*3/uL (ref 0.00–0.07)
Basophils Absolute: 0 10*3/uL (ref 0.0–0.1)
Basophils Relative: 0 %
Eosinophils Absolute: 0 10*3/uL (ref 0.0–0.5)
Eosinophils Relative: 0 %
HCT: 25.9 % — ABNORMAL LOW (ref 39.0–52.0)
Hemoglobin: 8.1 g/dL — ABNORMAL LOW (ref 13.0–17.0)
Immature Granulocytes: 1 %
Lymphocytes Relative: 2 %
Lymphs Abs: 0.2 10*3/uL — ABNORMAL LOW (ref 0.7–4.0)
MCH: 28.1 pg (ref 26.0–34.0)
MCHC: 31.3 g/dL (ref 30.0–36.0)
MCV: 89.9 fL (ref 80.0–100.0)
Monocytes Absolute: 0.3 10*3/uL (ref 0.1–1.0)
Monocytes Relative: 4 %
Neutro Abs: 7.8 10*3/uL — ABNORMAL HIGH (ref 1.7–7.7)
Neutrophils Relative %: 93 %
Platelets: 221 10*3/uL (ref 150–400)
RBC: 2.88 MIL/uL — ABNORMAL LOW (ref 4.22–5.81)
RDW: 14.5 % (ref 11.5–15.5)
WBC: 8.3 10*3/uL (ref 4.0–10.5)
nRBC: 0 % (ref 0.0–0.2)

## 2023-05-03 LAB — RESP PANEL BY RT-PCR (RSV, FLU A&B, COVID)  RVPGX2
Influenza A by PCR: NEGATIVE
Influenza B by PCR: NEGATIVE
Resp Syncytial Virus by PCR: NEGATIVE
SARS Coronavirus 2 by RT PCR: NEGATIVE

## 2023-05-03 LAB — BASIC METABOLIC PANEL
Anion gap: 10 (ref 5–15)
BUN: 19 mg/dL (ref 6–20)
CO2: 22 mmol/L (ref 22–32)
Calcium: 8.7 mg/dL — ABNORMAL LOW (ref 8.9–10.3)
Chloride: 97 mmol/L — ABNORMAL LOW (ref 98–111)
Creatinine, Ser: 1.23 mg/dL (ref 0.61–1.24)
GFR, Estimated: >60 mL/min (ref >60)
Glucose, Bld: 120 mg/dL — ABNORMAL HIGH (ref 70–99)
Potassium: 3.8 mmol/L (ref 3.5–5.1)
Sodium: 129 mmol/L — ABNORMAL LOW (ref 135–145)

## 2023-05-03 MED ORDER — DOXYCYCLINE HYCLATE 100 MG PO TABS
100.0000 mg | ORAL_TABLET | Freq: Once | ORAL | Status: AC
  Administered 2023-05-03: 100 mg via ORAL
  Filled 2023-05-03: qty 1

## 2023-05-03 MED ORDER — AZITHROMYCIN 250 MG PO TABS: ORAL_TABLET | ORAL | 0 refills | Status: AC

## 2023-05-03 MED ORDER — DOXYCYCLINE HYCLATE 100 MG PO CAPS: 100.0000 mg | ORAL_CAPSULE | Freq: Two times a day (BID) | ORAL | 0 refills | Status: DC

## 2023-05-03 MED ORDER — ACETAMINOPHEN 325 MG PO TABS
650.0000 mg | ORAL_TABLET | Freq: Once | ORAL | Status: AC
  Administered 2023-05-03: 650 mg via ORAL
  Filled 2023-05-03: qty 2

## 2023-05-03 MED ORDER — ALBUTEROL SULFATE HFA 108 (90 BASE) MCG/ACT IN AERS: 2.0000 | INHALATION_SPRAY | RESPIRATORY_TRACT | Status: DC | PRN

## 2023-05-03 MED ORDER — IBUPROFEN 400 MG PO TABS
600.0000 mg | ORAL_TABLET | Freq: Once | ORAL | Status: AC
  Administered 2023-05-03: 600 mg via ORAL
  Filled 2023-05-03: qty 1

## 2023-05-03 NOTE — Discharge Instructions (Signed)
Today your chest x-ray demonstrated a left lower lobe lung consolidation.  The setting of coughing and the fever this is likely pneumonia.  Doxycycline antibiotic given in ED and sent to your pharmacy.  For fever, body aches, joint pain, and chills take Tylenol 650 mg every 6-8 hours as needed.  With your primary care physician in the next 3 to 5 days symptoms do not begin to improve.  Return to emergency department as needed .

## 2023-05-03 NOTE — ED Triage Notes (Signed)
Extreme fatigue, muscle aches,coughing up a lot of phlegm (bloody phlegm). Temp 100-102 at home, has been taking BC powders. Last at 1100, taking one every 5 or 6 hours

## 2023-05-03 NOTE — ED Provider Notes (Incomplete)
[place] EMERGENCY DEPARTMENT AT [facility] Provider Note   CSN: [id number] Arrival date & time: 05/03/23  1628     History {Add pertinent medical, surgical, social history, OB history to HPI:1} Chief Complaint  Patient presents with   Shortness of Breath    SAVIAN MAZON is a 52 y.o. male.  Patient is a 52 yo male presenting for fever. Fever on arrival is 103.1 F orally. Patient admits to productive cough and shortness of breath with exertion x 4 days. Admits to exertional shortness of breath without chest pain. Denies nausea, vomiting, or diarrhea.   The history is provided by the patient. No language interpreter was used.  Shortness of Breath Associated symptoms: cough and fever   Associated symptoms: no abdominal pain, no chest pain, no ear pain, no rash, no sore throat and no vomiting        Home Medications Prior to Admission medications   Medication Sig Start Date End Date Taking? Authorizing Provider  aspirin EC 81 MG tablet Take 1 tablet (81 mg total) by mouth daily. 10/15/18   Laurey Morale, MD  carvedilol (COREG) 12.5 MG tablet TAKE ONE TABLET BY MOUTH TWICE DAILY 12/16/22   Laurey Morale, MD  furosemide (LASIX) 20 MG tablet Take 1 tablet (20 mg total) by mouth as needed. 02/12/22   [place], Anderson Malta, FNP  potassium chloride SA (KLOR-CON M) 20 MEQ tablet Take 1 tablet (20 mEq total) by mouth as needed. 02/12/22   Jacklynn Ganong, FNP  spironolactone (ALDACTONE) 25 MG tablet TAKE 1 TABLET(25 MG) BY MOUTH DAILY, NEEDS FOLLOW UP APPOINTMENT FOR MORE REFILLS 03/05/23   Laurey Morale, MD  valsartan (DIOVAN) 80 MG tablet TAKE ONE TABLET BY MOUTH TWICE DAILY. 04/03/23   Laurey Morale, MD  warfarin (COUMADIN) 5 MG tablet TAKE 1-1 1/2 TABLETS DAILY AS DIRECTED 03/17/23   Camnitz, Andree Coss, MD      Allergies    Morphine and codeine    Review of Systems   Review of Systems  Constitutional:  Positive for fever. Negative for chills.  HENT:   Negative for ear pain and sore throat.   Eyes:  Negative for pain and visual disturbance.  Respiratory:  Positive for cough and shortness of breath.   Cardiovascular:  Negative for chest pain and palpitations.  Gastrointestinal:  Negative for abdominal pain and vomiting.  Genitourinary:  Negative for dysuria and hematuria.  Musculoskeletal:  Negative for arthralgias and back pain.  Skin:  Negative for color change and rash.  Neurological:  Negative for seizures and syncope.  All other systems reviewed and are negative.   Physical Exam Updated Vital Signs BP 126/78   Pulse 81   Temp (!) 103.1 F (39.5 C) (Oral)   Resp 18   SpO2 95%  Physical Exam Vitals and nursing note reviewed.  Constitutional:      General: He is not in acute distress.    Appearance: He is well-developed.  HENT:     Head: Normocephalic and atraumatic.  Eyes:     Conjunctiva/sclera: Conjunctivae normal.  Cardiovascular:     Rate and Rhythm: Normal rate and regular rhythm.     Heart sounds: No murmur heard. Pulmonary:     Effort: Pulmonary effort is normal. No respiratory distress.     Breath sounds: Normal breath sounds.  Abdominal:     Palpations: Abdomen is soft.     Tenderness: There is no abdominal tenderness.  Musculoskeletal:  General: No swelling.     Cervical back: Neck supple.  Skin:    General: Skin is warm and dry.     Capillary Refill: Capillary refill takes less than 2 seconds.  Neurological:     Mental Status: He is alert.  Psychiatric:        Mood and Affect: Mood normal.     ED Results / Procedures / Treatments   Labs (all labs ordered are listed, but only abnormal results are displayed) Labs Reviewed  RESP PANEL BY RT-PCR (RSV, FLU A&B, COVID)  [id number]    EKG None  Radiology DG Chest 2 View  Result Date: 05/03/2023 CLINICAL DATA:  Short of breath. EXAM: CHEST - 2 VIEW COMPARISON:  07/20/2022 FINDINGS: There is mild cardiac enlargement. Previous median sternotomy  and aortic valve replacement. Left atrial clip. Mild cardiac enlargement. No pleural fluid or edema. Airspace consolidation is identified within the left lower lobe. Right lung is clear. Osseous structures are unremarkable. IMPRESSION: 1. Left lower lobe pneumonia. Followup PA and lateral chest X-ray is recommended in 3-4 weeks following trial of antibiotic therapy to ensure resolution and exclude underlying malignancy. 2. Cardiomegaly. Electronically Signed   By: Signa Kell M.D.   On: 05/03/2023 18:11    Procedures Procedures  {Document cardiac monitor, telemetry assessment procedure when appropriate:1}  Medications Ordered in ED Medications  albuterol (VENTOLIN HFA) 108 (90 Base) MCG/ACT inhaler 2 puff (has no administration in time range)  acetaminophen (TYLENOL) tablet 650 mg (650 mg Oral Given 05/03/23 1843)    ED Course/ Medical Decision Making/ A&P   {   Click here for ABCD2, HEART and other calculatorsREFRESH Note before signing :1}                              Medical Decision Making Amount and/or Complexity of Data Reviewed Radiology: ordered.  Risk OTC drugs. Prescription drug management.   52 yo male presenting for fever, productive cough, and generalized weakness.   {Document critical care time when appropriate:1} {Document review of labs and clinical decision tools ie heart score, Chads2Vasc2 etc:1}  {Document your independent review of radiology images, and any outside records:1} {Document your discussion with family members, caretakers, and with consultants:1} {Document social determinants of health affecting pt's care:1} {Document your decision making why or why not admission, treatments were needed:1} Final Clinical Impression(s) / ED Diagnoses Final diagnoses:  None    Rx / DC Orders ED Discharge Orders     None

## 2023-05-07 ENCOUNTER — Encounter (HOSPITAL_COMMUNITY): Payer: Self-pay | Admitting: Cardiology

## 2023-06-02 ENCOUNTER — Other Ambulatory Visit: Payer: Self-pay | Admitting: Cardiology

## 2023-06-04 ENCOUNTER — Ambulatory Visit: Payer: BLUE CROSS/BLUE SHIELD

## 2023-06-10 ENCOUNTER — Ambulatory Visit: Payer: BLUE CROSS/BLUE SHIELD | Attending: Cardiovascular Disease

## 2023-06-10 ENCOUNTER — Telehealth: Payer: Self-pay

## 2023-06-10 DIAGNOSIS — Z5181 Encounter for therapeutic drug level monitoring: Secondary | ICD-10-CM

## 2023-06-10 DIAGNOSIS — I480 Paroxysmal atrial fibrillation: Secondary | ICD-10-CM | POA: Diagnosis not present

## 2023-06-10 DIAGNOSIS — Z9889 Other specified postprocedural states: Secondary | ICD-10-CM | POA: Diagnosis not present

## 2023-06-10 DIAGNOSIS — Z954 Presence of other heart-valve replacement: Secondary | ICD-10-CM | POA: Diagnosis not present

## 2023-06-10 LAB — POCT INR: INR: 3.2 — AB (ref 2.0–3.0)

## 2023-06-10 NOTE — Telephone Encounter (Signed)
Left message to call back  

## 2023-06-10 NOTE — Telephone Encounter (Signed)
Pt came to Anticoagulation Clinic today for INR check. INR 3.2 (goal 2.5-3.5).  Pt states he thinks he is back in Afib and wondering if he should taking any as needed medication or restart Amio. I made pt aware he should not restart any medication without provider advice. Pt states his heart rate has been between 110-120's, denies chest pain, shortness or breath, or any other issues at this time.

## 2023-06-10 NOTE — Patient Instructions (Signed)
Description   Continue taking Warfarin 1 tablet daily except for 1/2 tablet on Sundays and Wednesdays.  Stay consistent with greens each week (3 per week)  Recheck INR in 4 weeks.  Coumadin clinic [phone]

## 2023-07-07 ENCOUNTER — Ambulatory Visit: Payer: BLUE CROSS/BLUE SHIELD | Attending: Cardiology

## 2023-07-07 DIAGNOSIS — I359 Nonrheumatic aortic valve disorder, unspecified: Secondary | ICD-10-CM

## 2023-07-07 DIAGNOSIS — Z5181 Encounter for therapeutic drug level monitoring: Secondary | ICD-10-CM

## 2023-07-07 DIAGNOSIS — I480 Paroxysmal atrial fibrillation: Secondary | ICD-10-CM | POA: Diagnosis not present

## 2023-07-07 DIAGNOSIS — Z954 Presence of other heart-valve replacement: Secondary | ICD-10-CM | POA: Diagnosis not present

## 2023-07-07 DIAGNOSIS — Z9889 Other specified postprocedural states: Secondary | ICD-10-CM

## 2023-07-07 LAB — POCT INR: INR: 3.5 — AB (ref 2.0–3.0)

## 2023-07-07 NOTE — Patient Instructions (Signed)
Continue taking Warfarin 1 tablet daily except for 1/2 tablet on Sundays and Wednesdays.  Stay consistent with greens each week (3 per week)  Recheck INR in 6 weeks.  Coumadin clinic [phone]

## 2023-07-20 ENCOUNTER — Other Ambulatory Visit (HOSPITAL_COMMUNITY): Payer: Self-pay | Admitting: Cardiology

## 2023-08-06 ENCOUNTER — Other Ambulatory Visit: Payer: Self-pay | Admitting: Cardiology

## 2023-08-06 DIAGNOSIS — I480 Paroxysmal atrial fibrillation: Secondary | ICD-10-CM

## 2023-08-06 DIAGNOSIS — Q2381 Bicuspid aortic valve: Secondary | ICD-10-CM

## 2023-08-06 NOTE — Telephone Encounter (Signed)
Prescription refill request received for warfarin Lov: 03/20/23 (Camnitz)  Next INR check: 08/17/23 Warfarin tablet strength: 5mg   Appropriate dose. Refill sent.

## 2023-08-17 ENCOUNTER — Ambulatory Visit: Payer: PRIVATE HEALTH INSURANCE | Attending: Cardiology

## 2023-08-17 DIAGNOSIS — Z5181 Encounter for therapeutic drug level monitoring: Secondary | ICD-10-CM

## 2023-08-17 DIAGNOSIS — Z954 Presence of other heart-valve replacement: Secondary | ICD-10-CM

## 2023-08-17 DIAGNOSIS — I480 Paroxysmal atrial fibrillation: Secondary | ICD-10-CM

## 2023-08-17 DIAGNOSIS — I359 Nonrheumatic aortic valve disorder, unspecified: Secondary | ICD-10-CM | POA: Diagnosis not present

## 2023-08-17 DIAGNOSIS — Z9889 Other specified postprocedural states: Secondary | ICD-10-CM | POA: Diagnosis not present

## 2023-08-17 LAB — POCT INR: INR: 5.8 — AB (ref 2.0–3.0)

## 2023-08-17 NOTE — Patient Instructions (Signed)
HOLD TUESDAY and WEDNESDAY THEN Continue taking Warfarin 1 tablet daily except for 1/2 tablet on Sundays and Wednesdays.  Stay consistent with greens each week (3 per week)  Recheck INR in 2 weeks.  Coumadin clinic [phone]

## 2023-08-31 ENCOUNTER — Telehealth: Payer: Self-pay

## 2023-08-31 ENCOUNTER — Ambulatory Visit: Payer: BLUE CROSS/BLUE SHIELD | Attending: Cardiology

## 2023-08-31 DIAGNOSIS — Z9889 Other specified postprocedural states: Secondary | ICD-10-CM | POA: Diagnosis not present

## 2023-08-31 DIAGNOSIS — I480 Paroxysmal atrial fibrillation: Secondary | ICD-10-CM

## 2023-08-31 DIAGNOSIS — Z5181 Encounter for therapeutic drug level monitoring: Secondary | ICD-10-CM | POA: Diagnosis not present

## 2023-08-31 DIAGNOSIS — Z954 Presence of other heart-valve replacement: Secondary | ICD-10-CM | POA: Diagnosis not present

## 2023-08-31 LAB — POCT INR: INR: 5.6 — AB (ref 2.0–3.0)

## 2023-08-31 NOTE — Patient Instructions (Addendum)
 Description   HOLD TOMORROW and WEDNESDAY'S DOSE and then START taking Warfarin 1 tablet daily except for 1/2 tablet on Sundays, Wednesdays, Fridays.  Stay consistent with greens each week (3 per week)  Recheck INR in 1 week.  Coumadin  clinic [phone]

## 2023-08-31 NOTE — Telephone Encounter (Signed)
 Pt came to anticoagulation clinic today to have INR checked. INR 5.6 at today's visit. Pt states he thinks he has been in Afib since November/December so he decided to restart his Amiodarone  about 2 months ago, hoping that would help with his symptoms. This is probably the cause of recent supra therapeutic INRs. I did make him aware he should reach out to our office or the Afib Clinic before he restarts medications.  Will forward to Afib Clinic for further advice.

## 2023-09-07 ENCOUNTER — Ambulatory Visit: Payer: BLUE CROSS/BLUE SHIELD | Attending: Cardiovascular Disease | Admitting: *Deleted

## 2023-09-07 DIAGNOSIS — I480 Paroxysmal atrial fibrillation: Secondary | ICD-10-CM

## 2023-09-07 DIAGNOSIS — Z5181 Encounter for therapeutic drug level monitoring: Secondary | ICD-10-CM | POA: Diagnosis not present

## 2023-09-07 DIAGNOSIS — Z954 Presence of other heart-valve replacement: Secondary | ICD-10-CM

## 2023-09-07 DIAGNOSIS — Z9889 Other specified postprocedural states: Secondary | ICD-10-CM

## 2023-09-07 DIAGNOSIS — I359 Nonrheumatic aortic valve disorder, unspecified: Secondary | ICD-10-CM | POA: Diagnosis not present

## 2023-09-07 LAB — POCT INR: INR: 2.9 (ref 2.0–3.0)

## 2023-09-07 NOTE — Patient Instructions (Addendum)
Description   Continue taking Warfarin 1 tablet daily except for 1/2 tablet on Sundays, Wednesdays, Fridays.  Stay consistent with greens each week (3 per week).  Recheck INR in 2 weeks.   Coumadin clinic [phone]

## 2023-09-09 ENCOUNTER — Inpatient Hospital Stay (HOSPITAL_COMMUNITY)
Admission: RE | Admit: 2023-09-09 | Payer: BLUE CROSS/BLUE SHIELD | Source: Ambulatory Visit | Admitting: Internal Medicine

## 2023-09-09 NOTE — Progress Notes (Incomplete)
Primary Care Physician: Georgianne Fick, MD Primary Cardiologist: Dr Shirlee Latch Primary Electrophysiologist: Dr Elberta Fortis Referring Physician: Dr Federico Flake [name] Hinton is a 53 y.o. male with a history of alcohol abuse, tobacco use, bicuspid AV s/p Bentall and MAZE, systolic CHF, HTN, OSA, atrial flutter, atrial fibrillation who presents for follow up in the [facility]. He was hospitalized 2015 with heart failure and severe aortic insufficiency.  Found to have atrial flutter.  He was found to have a bicuspid aortic valve and ascending aortic aneurysm.  He is status post Bentall procedure with ascending aortic aneurysm and arch replacement with aortic valve replacement March 2015.  He had a maze at the time with left atrial appendage occlusion.  He has had episodes of atypical atrial flutter.  He was initially on amiodarone.   In 2018 he was admitted for lower GI bleed.  This suspected due to hemorrhoids.  2019 his ejection fraction improved to normal.   July 2023, he went back into atrial flutter.  He had a TEE and cardioversion for atypical atrial flutter.  Amiodarone was resumed. Patient is on warfarin for a CHADS2VASC score of 2. He was seen by Dr Elberta Fortis who recommended ablation.  He was hospitalized again for acute anemia, hgb 6.6. Fecal occult was positive and he received 2 units PRBC. Patient deferred colonoscopy to do as an outpatient.   On follow up today, patient reports that he feels well. He remains in SR. No recent bleeding issues. Patient is scheduled for ablation 08/13/22 with Dr Elberta Fortis.   On follow up 09/09/23, patient is currently in ***. Review of phone notes show patient was in anticoagulation clinic 08/31/23 and had a supra therapeutic INR of 5.6. He noted that he believes he has been in Afib since November/December 2024 and on his own restarted amiodarone 2 months ago. He is taking amiodarone 200 mg daily. He is on coumadin.  Today, he denies  symptoms of palpitations, chest pain, shortness of breath, orthopnea, PND, lower extremity edema, dizziness, presyncope, syncope, snoring, daytime somnolence, bleeding, or neurologic sequela. The patient is tolerating medications without difficulties and is otherwise without complaint today.    Atrial Fibrillation Risk Factors:  he does have symptoms or diagnosis of sleep apnea. he is waiting for CPAP. he does not have a history of rheumatic fever. he does have a history of alcohol use.   he has a BMI of There is no height or weight on file to calculate BMI.. There were no vitals filed for this visit.   Family History  Problem Relation Age of Onset   Heart failure Father    Coronary artery disease Father        CABG    Atrial Fibrillation Management history:  Previous antiarrhythmic drugs: amiodarone Previous cardioversions: 2015 x 2, 11/15/21, 02/14/22, 03/10/22 Previous ablations: 12/18/22 CHADS2VASC score: 2 Anticoagulation history: warfarin   Past Medical History:  Diagnosis Date   Dysrhythmia    ETOH abuse    Heart murmur    Hx of repair of ascending aorta 09/27/2013   Non-ischemic cardiomyopathy (HCC)    PAF (paroxysmal atrial fibrillation) (HCC)    a. Dx 07/2013, s/p TEE/DCCV.   Paroxysmal atrial flutter (HCC)    a. Dx 07/2013.   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 Bicuspid aortic valve with severe AI, dilated ascending aorta dx 07/2013.   Systolic CHF (HCC)    a. Dx 07/2013: EF 25%, felt likely related to combo of EtOH + tachy-mediated + AI. LHC pending.   Past Surgical History:  Procedure Laterality Date   AORTIC VALVE  REPLACEMENT  2015   (using a [employer] medics Carbo-Seal Valsalva, valve size 29 mm)   ASCENDING AORTIC ROOT REPLACEMENT N/A 09/27/2013   Procedure: ASCENDING AORTIC ROOT REPLACEMENT;  Surgeon: Purcell Nails, MD;  Location: [facility] OR;  Service: Open Heart Surgery;  Laterality: N/A;   ATRIAL FIBRILLATION ABLATION N/A 12/18/2022   Procedure: ATRIAL FIBRILLATION ABLATION;  Surgeon: Regan Lemming, MD;  Location: [facility] INVASIVE CV LAB;  Service: Cardiovascular;  Laterality: N/A;   BENTALL PROCEDURE N/A 09/27/2013   Procedure: BENTALL PROCEDURE;  Surgeon: Purcell Nails, MD;  Location: [facility] OR;  Service: Open Heart Surgery;  Laterality: N/A;   CARDIAC CATHETERIZATION     CARDIOVERSION N/A 08/01/2013   Procedure: CARDIOVERSION;  Surgeon: Laurey Morale, MD;  Location: [facility] ENDOSCOPY;  Service: Cardiovascular;  Laterality: N/A;   CARDIOVERSION N/A 11/15/2021   Procedure: CARDIOVERSION;  Surgeon: Laurey Morale, MD;  Location: [facility] ENDOSCOPY;  Service: Cardiovascular;  Laterality: N/A;   CARDIOVERSION N/A 02/14/2022   Procedure: CARDIOVERSION;  Surgeon: Laurey Morale, MD;  Location: [facility] ENDOSCOPY;  Service: Cardiovascular;  Laterality: N/A;   CARDIOVERSION N/A 03/10/2022   Procedure: CARDIOVERSION;  Surgeon: Laurey Morale, MD;  Location: [facility] ENDOSCOPY;  Service: Cardiovascular;  Laterality: N/A;   COLONOSCOPY WITH PROPOFOL Left 10/10/2016   Procedure: COLONOSCOPY WITH PROPOFOL;  Surgeon: Willis Modena, MD;  Location: [facility] ENDOSCOPY;  Service: Endoscopy;  Laterality: Left;   INTRAOPERATIVE TRANSESOPHAGEAL ECHOCARDIOGRAM N/A 09/27/2013   Procedure: INTRAOPERATIVE TRANSESOPHAGEAL ECHOCARDIOGRAM;  Surgeon: Purcell Nails, MD;  Location: [facility] OR;  Service: Open Heart Surgery;  Laterality: N/A;   LEFT AND RIGHT HEART CATHETERIZATION WITH CORONARY ANGIOGRAM N/A 09/05/2013   Procedure: LEFT AND RIGHT HEART CATHETERIZATION WITH CORONARY ANGIOGRAM;  Surgeon: Laurey Morale, MD;  Location: [facility] CATH LAB;  Service:  Cardiovascular;  Laterality: N/A;   MAZE N/A 09/27/2013   Procedure: MAZE;  Surgeon: Purcell Nails, MD;  Location: [facility] OR;  Service: Open Heart Surgery;  Laterality: N/A;   NO PAST SURGERIES     TEE WITHOUT CARDIOVERSION N/A 08/01/2013   Procedure: TRANSESOPHAGEAL ECHOCARDIOGRAM (TEE);  Surgeon: Laurey Morale, MD;  Location: [facility] ENDOSCOPY;  Service: Cardiovascular;  Laterality: N/A;   TEE WITHOUT CARDIOVERSION N/A 11/15/2021   Procedure: TRANSESOPHAGEAL ECHOCARDIOGRAM (TEE);  Surgeon: Laurey Morale, MD;  Location: [facility] ENDOSCOPY;  Service: Cardiovascular;  Laterality: N/A;   TEE WITHOUT CARDIOVERSION N/A 03/10/2022   Procedure: TRANSESOPHAGEAL ECHOCARDIOGRAM (TEE);  Surgeon: Laurey Morale, MD;  Location: [facility] ENDOSCOPY;  Service: Cardiovascular;  Laterality: N/A;    Current Outpatient Medications  Medication Sig Dispense Refill   aspirin EC 81 MG tablet Take 1 tablet (81 mg total) by mouth daily. 90 tablet 3   carvedilol (COREG) 12.5 MG tablet TAKE ONE TABLET BY MOUTH TWICE DAILY 60 tablet 11   doxycycline (VIBRAMYCIN) 100 MG capsule Take 1 capsule (100 mg total) by mouth 2 (two) times daily. 20 capsule 0   furosemide (LASIX) 20 MG tablet Take 1 tablet (20 mg total) by mouth as needed. 45 tablet 1  potassium chloride SA (KLOR-CON M) 20 MEQ tablet Take 1 tablet (20 mEq total) by mouth as needed. 20 tablet 4   spironolactone (ALDACTONE) 25 MG tablet TAKE 1 TABLET(25 MG) BY MOUTH DAILY, NEEDS FOLLOW UP APPOINTMENT FOR MORE REFILLS 90 tablet 0   valsartan (DIOVAN) 80 MG tablet TAKE ONE TABLET BY MOUTH TWICE DAILY. (PLEASE SCHEDULE AN OFFICE VISIT FOR FURTHER REFILLS) 180 tablet 0   warfarin (COUMADIN) 5 MG tablet TAKE 1/2 TABLET TO 1 TABLET BY MOUTH DAILY AS DIRECTED BY COUMADIN CLINIC 75 tablet 0   No current facility-administered medications for this visit.    Allergies  Allergen Reactions   Morphine And Codeine Nausea Only   ROS- All systems are reviewed and negative except as per  the HPI above.  Physical Exam: There were no vitals filed for this visit.  GEN- The patient is well appearing, alert and oriented x 3 today.   Neck - no JVD or carotid bruit noted Lungs- Clear to ausculation bilaterally, normal work of breathing Heart- ***Regular rate and rhythm, no murmurs, rubs or gallops, PMI not laterally displaced Extremities- no clubbing, cyanosis, or edema Skin - no rash or ecchymosis noted   Wt Readings from Last 3 Encounters:  03/20/23 84.8 kg  12/18/22 79.4 kg  11/10/22 83 kg    EKG today demonstrates  ***  Echo 05/14/20 demonstrated   1. Distal septal hypokinesis . Left ventricular ejection fraction, by  estimation, is 50 to 55%. The left ventricle has low normal function. The  left ventricle has no regional wall motion abnormalities. Left ventricular  diastolic parameters were normal.   2. Right ventricular systolic function is normal. The right ventricular  size is normal. There is normal pulmonary artery systolic pressure.   3. Left atrial size was moderately dilated.   4. Right atrial size was moderately dilated.   5. The mitral valve is normal in structure. Trivial mitral valve  regurgitation. No evidence of mitral stenosis.   6. Post Bental with mechanical AVR normal function no PVL/AR mean  gradient stable since previous echo done November 2019 Trivial closing  volume AR. The aortic valve has been repaired/replaced. Aortic valve  regurgitation is trivial. No aortic stenosis is   present. There is a 29 mm Sorin Carbomedics mechanical valve present in  the aortic position. Procedure Date: 09/27/2013.   7. The inferior vena cava is normal in size with greater than 50%  respiratory variability, suggesting right atrial pressure of 3 mmHg.   Epic records are reviewed at length today  CHA2DS2-VASc Score =    The patient's score is based upon:    {Confirm score is correct.  If not, click here to update score.  REFRESH note.  :1}     ASSESSMENT AND PLAN: 1. Persistent Atrial Fibrillation/atrial flutter The patient's CHA2DS2-VASc score is  , indicating a  % annual risk of stroke.   S/p Afib ablation on 12/18/22 by Dr. Elberta Fortis.   He is currently in ***. He restarted amiodarone on his own 2 months ago due to feeling he has been in Afib since November/December 2024.   High risk medication monitoring (ICD10: R7229428) Patient requires ongoing monitoring for anti-arrhythmic medication which has the potential to cause life threatening arrhythmias or AV block. Qtc stable. Continue amiodarone 200 mg once daily.  2. Secondary Hypercoagulable State (ICD10:  D68.69) The patient is at significant risk for stroke/thromboembolism based upon his CHA2DS2-VASc Score of  .  Continue Warfarin (Coumadin).  Continue coumadin as  directed.  3. Chronic systolic CHF Euvolemic today.  4. Severe AI Bicuspid AV s/p Bentall. He has a mechanical aortic valve. He is on coumadin.  5. HTN ***  6. OSA He is compliant with CPAP.   Follow up ***.   Justin Mend, PA-C Afib Clinic [facility] [address] [place], [place] [address] [phone] 09/09/2023 8:55 AM

## 2023-09-15 ENCOUNTER — Encounter (HOSPITAL_COMMUNITY): Payer: Self-pay

## 2023-09-16 ENCOUNTER — Ambulatory Visit (HOSPITAL_COMMUNITY): Payer: BLUE CROSS/BLUE SHIELD | Admitting: Internal Medicine

## 2023-09-18 ENCOUNTER — Ambulatory Visit (HOSPITAL_COMMUNITY)
Admission: RE | Admit: 2023-09-18 | Discharge: 2023-09-18 | Disposition: A | Discharge status: Home / Self Care | Payer: PRIVATE HEALTH INSURANCE | Source: Ambulatory Visit | Attending: Internal Medicine | Admitting: Internal Medicine

## 2023-09-18 VITALS — BP 142/102 | HR 103 | Ht 68.0 in | Wt 180.8 lb

## 2023-09-18 DIAGNOSIS — I4891 Unspecified atrial fibrillation: Secondary | ICD-10-CM | POA: Diagnosis not present

## 2023-09-18 DIAGNOSIS — D6869 Other thrombophilia: Secondary | ICD-10-CM | POA: Insufficient documentation

## 2023-09-18 DIAGNOSIS — Q2381 Bicuspid aortic valve: Secondary | ICD-10-CM | POA: Diagnosis not present

## 2023-09-18 DIAGNOSIS — I110 Hypertensive heart disease with heart failure: Secondary | ICD-10-CM | POA: Diagnosis not present

## 2023-09-18 DIAGNOSIS — I5022 Chronic systolic (congestive) heart failure: Secondary | ICD-10-CM | POA: Diagnosis not present

## 2023-09-18 DIAGNOSIS — Z952 Presence of prosthetic heart valve: Secondary | ICD-10-CM | POA: Diagnosis not present

## 2023-09-18 DIAGNOSIS — I4819 Other persistent atrial fibrillation: Secondary | ICD-10-CM | POA: Diagnosis not present

## 2023-09-18 DIAGNOSIS — Z7901 Long term (current) use of anticoagulants: Secondary | ICD-10-CM | POA: Diagnosis not present

## 2023-09-18 DIAGNOSIS — Z79899 Other long term (current) drug therapy: Secondary | ICD-10-CM | POA: Diagnosis not present

## 2023-09-18 DIAGNOSIS — I484 Atypical atrial flutter: Secondary | ICD-10-CM

## 2023-09-18 DIAGNOSIS — I4892 Unspecified atrial flutter: Secondary | ICD-10-CM | POA: Insufficient documentation

## 2023-09-18 DIAGNOSIS — I351 Nonrheumatic aortic (valve) insufficiency: Secondary | ICD-10-CM | POA: Insufficient documentation

## 2023-09-18 MED ORDER — CARVEDILOL 25 MG PO TABS: 25.0000 mg | ORAL_TABLET | Freq: Two times a day (BID) | ORAL | 3 refills | Status: DC

## 2023-09-18 NOTE — Progress Notes (Signed)
 Primary Care Physician: Georgianne Fick, MD Primary Cardiologist: Dr Shirlee Latch Primary Electrophysiologist: Dr Elberta Fortis Referring Physician: Dr Federico Flake [name] Hinton is a 53 y.o. male with a history of alcohol abuse, tobacco use, bicuspid AV s/p Bentall and MAZE, systolic CHF, HTN, OSA, atrial flutter, atrial fibrillation who presents for follow up in the [facility]. He was hospitalized 2015 with heart failure and severe aortic insufficiency.  Found to have atrial flutter.  He was found to have a bicuspid aortic valve and ascending aortic aneurysm.  He is status post Bentall procedure with ascending aortic aneurysm and arch replacement with aortic valve replacement March 2015.  He had a maze at the time with left atrial appendage occlusion.  He has had episodes of atypical atrial flutter.  He was initially on amiodarone.   In 2018 he was admitted for lower GI bleed.  This suspected due to hemorrhoids.  2019 his ejection fraction improved to normal.   July 2023, he went back into atrial flutter.  He had a TEE and cardioversion for atypical atrial flutter.  Amiodarone was resumed. Patient is on warfarin for a CHADS2VASC score of 2. He was seen by Dr Elberta Fortis who recommended ablation.  He was hospitalized again for acute anemia, hgb 6.6. Fecal occult was positive and he received 2 units PRBC. Patient deferred colonoscopy to do as an outpatient.   On follow up today, patient reports that he feels well. He remains in SR. No recent bleeding issues. Patient is scheduled for ablation 08/13/22 with Dr Elberta Fortis.   On follow up 09/18/23, patient is currently in atrial flutter. Review of phone notes show patient was in anticoagulation clinic 08/31/23 and had a supra therapeutic INR of 5.6. He noted that he believes he has been in Afib since November/December 2024 and on his own restarted amiodarone 2 months ago. He began taking amiodarone 200 mg once daily in December and  stopped taking it 2 weeks ago. He felt like it wasn't really helping and that's why he stopped when pharmacist noted his INR level was elevated. He is on coumadin.  Today, he denies symptoms of palpitations, chest pain, shortness of breath, orthopnea, PND, lower extremity edema, dizziness, presyncope, syncope, snoring, daytime somnolence, bleeding, or neurologic sequela. The patient is tolerating medications without difficulties and is otherwise without complaint today.    Atrial Fibrillation Risk Factors:  he does have symptoms or diagnosis of sleep apnea. he is waiting for CPAP. he does not have a history of rheumatic fever. he does have a history of alcohol use.   he has a BMI of Body mass index is 27.49 kg/m.[name] Filed Weights   09/18/23 1132  Weight: 82 kg     Family History  Problem Relation Age of Onset   Heart failure Father    Coronary artery disease Father        CABG    Atrial Fibrillation Management history:  Previous antiarrhythmic drugs: amiodarone Previous cardioversions: 2015 x 2, 11/15/21, 02/14/22, 03/10/22 Previous ablations: 12/18/22 CHADS2VASC score: 2 Anticoagulation history: warfarin   Past Medical History:  Diagnosis Date   Dysrhythmia    ETOH abuse    Heart murmur    Hx of repair of ascending aorta 09/27/2013   Non-ischemic cardiomyopathy (HCC)    PAF (paroxysmal atrial fibrillation) (HCC)    a. Dx 07/2013, s/p TEE/DCCV.   Paroxysmal atrial flutter (HCC)    a. Dx 07/2013.   S/P Bentall aortic root replacement with  mechanical valve conduit and maze procedure 09/27/2013   29 mm Sorin Carbomedics Carbo-seal Valsalve mechanical valve conduit   S/P Maze operation for atrial fibrillation 09/27/2013   Complete bilateral atrial lesion set using bipolar radiofrequency and cryothermy ablation with clipping of LA appendage   Severe aortic insufficiency    a. Bicuspid aortic valve with severe AI, dilated ascending aorta dx 07/2013.   Systolic CHF (HCC)    a. Dx  07/2013: EF 25%, felt likely related to combo of EtOH + tachy-mediated + AI. LHC pending.   Past Surgical History:  Procedure Laterality Date   AORTIC VALVE REPLACEMENT  2015   (using a [employer] med[employer] Carbo-Seal Valsalva, valve size 29 mm)   ASCENDING AORTIC ROOT REPLACEMENT N/A 09/27/2013   Procedure: ASCENDING AORTIC ROOT REPLACEMENT;  Surgeon: Purcell Nails, MD;  Location: [facility] OR;  Service: Open Heart Surgery;  Laterality: N/A;   ATRIAL FIBRILLATION ABLATION N/A 12/18/2022   Procedure: ATRIAL FIBRILLATION ABLATION;  Surgeon: Regan Lemming, MD;  Location: [facility] INVASIVE CV LAB;  Service: Cardiovascular;  Laterality: N/A;   BENTALL PROCEDURE N/A 09/27/2013   Procedure: BENTALL PROCEDURE;  Surgeon: Purcell Nails, MD;  Location: [facility] OR;  Service: Open Heart Surgery;  Laterality: N/A;   CARDIAC CATHETERIZATION     CARDIOVERSION N/A 08/01/2013   Procedure: CARDIOVERSION;  Surgeon: Laurey Morale, MD;  Location: [facility] ENDOSCOPY;  Service: Cardiovascular;  Laterality: N/A;   CARDIOVERSION N/A 11/15/2021   Procedure: CARDIOVERSION;  Surgeon: Laurey Morale, MD;  Location: [facility] ENDOSCOPY;  Service: Cardiovascular;  Laterality: N/A;   CARDIOVERSION N/A 02/14/2022   Procedure: CARDIOVERSION;  Surgeon: Laurey Morale, MD;  Location: [facility] ENDOSCOPY;  Service: Cardiovascular;  Laterality: N/A;   CARDIOVERSION N/A 03/10/2022   Procedure: CARDIOVERSION;  Surgeon: Laurey Morale, MD;  Location: [facility] ENDOSCOPY;  Service: Cardiovascular;  Laterality: N/A;   COLONOSCOPY WITH PROPOFOL Left 10/10/2016   Procedure: COLONOSCOPY WITH PROPOFOL;  Surgeon: Willis Modena, MD;  Location: [facility] ENDOSCOPY;  Service: Endoscopy;  Laterality: Left;   INTRAOPERATIVE TRANSESOPHAGEAL ECHOCARDIOGRAM N/A 09/27/2013   Procedure: INTRAOPERATIVE TRANSESOPHAGEAL ECHOCARDIOGRAM;  Surgeon: Purcell Nails, MD;  Location: [facility] OR;  Service: Open Heart Surgery;  Laterality: N/A;   LEFT AND RIGHT HEART CATHETERIZATION WITH CORONARY ANGIOGRAM  N/A 09/05/2013   Procedure: LEFT AND RIGHT HEART CATHETERIZATION WITH CORONARY ANGIOGRAM;  Surgeon: Laurey Morale, MD;  Location: [facility] CATH LAB;  Service: Cardiovascular;  Laterality: N/A;   MAZE N/A 09/27/2013   Procedure: MAZE;  Surgeon: Purcell Nails, MD;  Location: [facility] OR;  Service: Open Heart Surgery;  Laterality: N/A;   NO PAST SURGERIES     TEE WITHOUT CARDIOVERSION N/A 08/01/2013   Procedure: TRANSESOPHAGEAL ECHOCARDIOGRAM (TEE);  Surgeon: Laurey Morale, MD;  Location: [facility] ENDOSCOPY;  Service: Cardiovascular;  Laterality: N/A;   TEE WITHOUT CARDIOVERSION N/A 11/15/2021   Procedure: TRANSESOPHAGEAL ECHOCARDIOGRAM (TEE);  Surgeon: Laurey Morale, MD;  Location: [facility] ENDOSCOPY;  Service: Cardiovascular;  Laterality: N/A;   TEE WITHOUT CARDIOVERSION N/A 03/10/2022   Procedure: TRANSESOPHAGEAL ECHOCARDIOGRAM (TEE);  Surgeon: Laurey Morale, MD;  Location: [facility] ENDOSCOPY;  Service: Cardiovascular;  Laterality: N/A;    Current Outpatient Medications  Medication Sig Dispense Refill   aspirin EC 81 MG tablet Take 1 tablet (81 mg total) by mouth daily. 90 tablet 3   furosemide (LASIX) 20 MG tablet Take 1 tablet (20 mg total) by mouth as needed. 45 tablet 1   potassium chloride SA (KLOR-CON M) 20 MEQ tablet  Take 1 tablet (20 mEq total) by mouth as needed. 20 tablet 4   spironolactone (ALDACTONE) 25 MG tablet TAKE 1 TABLET(25 MG) BY MOUTH DAILY, NEEDS FOLLOW UP APPOINTMENT FOR MORE REFILLS 90 tablet 0   valsartan (DIOVAN) 80 MG tablet TAKE ONE TABLET BY MOUTH TWICE DAILY. (PLEASE SCHEDULE AN OFFICE VISIT FOR FURTHER REFILLS) 180 tablet 0   warfarin (COUMADIN) 5 MG tablet TAKE 1/2 TABLET TO 1 TABLET BY MOUTH DAILY AS DIRECTED BY COUMADIN CLINIC 75 tablet 0   carvedilol (COREG) 25 MG tablet Take 1 tablet (25 mg total) by mouth 2 (two) times daily with a meal. 60 tablet 3   No current facility-administered medications for this encounter.    Allergies  Allergen Reactions   Morphine And Codeine  Nausea Only   ROS- All systems are reviewed and negative except as per the HPI above.  Physical Exam: Vitals:   09/18/23 1132  BP: (!) 142/102  Pulse: (!) 103  Weight: 82 kg  Height: 5\' 8"  (1.727 m)    GEN- The patient is well appearing, alert and oriented x 3 today.   Neck - no JVD or carotid bruit noted Lungs- Clear to ausculation bilaterally, normal work of breathing Heart- Regular rate (atrial flutter) and rhythm, click noted; no rubs or gallops, PMI not laterally displaced Extremities- no clubbing, cyanosis, or edema Skin - no rash or ecchymosis noted   Wt Readings from Last 3 Encounters:  09/18/23 82 kg  03/20/23 84.8 kg  12/18/22 79.4 kg    EKG today demonstrates  Vent. rate 103 BPM PR interval 232 ms QRS duration 104 ms QT/QTcB 372/487 ms P-R-T axes 154 93 126  Suspect arm lead reversal, interpretation assumes no reversal Unusual P axis, possible ectopic atrial tachycardia - appears to be atrial flutter Rightward axis ST & T wave abnormality, consider inferior ischemia Abnormal ECG When compared with ECG of 20-Mar-2023 13:30, PREVIOUS ECG IS PRESENT  Echo 05/14/20 demonstrated   1. Distal septal hypokinesis . Left ventricular ejection fraction, by  estimation, is 50 to 55%. The left ventricle has low normal function. The  left ventricle has no regional wall motion abnormalities. Left ventricular  diastolic parameters were normal.   2. Right ventricular systolic function is normal. The right ventricular  size is normal. There is normal pulmonary artery systolic pressure.   3. Left atrial size was moderately dilated.   4. Right atrial size was moderately dilated.   5. The mitral valve is normal in structure. Trivial mitral valve  regurgitation. No evidence of mitral stenosis.   6. Post Bental with mechanical AVR normal function no PVL/AR mean  gradient stable since previous echo done November 2019 Trivial closing  volume AR. The aortic valve has been  repaired/replaced. Aortic valve  regurgitation is trivial. No aortic stenosis is   present. There is a 29 mm Sorin Carbomedics mechanical valve present in  the aortic position. Procedure Date: 09/27/2013.   7. The inferior vena cava is normal in size with greater than 50%  respiratory variability, suggesting right atrial pressure of 3 mmHg.   Epic records are reviewed at length today  CHA2DS2-VASc Score = 2  The patient's score is based upon: CHF History: 1 HTN History: 1 Diabetes History: 0 Stroke History: 0 Vascular Disease History: 0 Age Score: 0 Gender Score: 0       ASSESSMENT AND PLAN: 1. Persistent Atrial Fibrillation/atrial flutter The patient's CHA2DS2-VASc score is 2, indicating a 2.2% annual risk of stroke.  S/p Afib ablation on 12/18/22 by Dr. Elberta Fortis.   He appears to be in atrial flutter. He stopped amiodarone 2 weeks ago but overall has been on amiodarone for almost 3 months. Discussion for plan of care with Dr. Elberta Fortis. After discussion, will elect not to restart amiodarone at this time. Will allow patient to remain in rate controlled atrial flutter and have patient follow up with Dr. Elberta Fortis to discuss repeat ablation. Will increase rate control and titrate his carvedilol to 25 mg BID. Patient advised not to restart amiodarone.   2. Secondary Hypercoagulable State (ICD10:  D68.69) The patient is at significant risk for stroke/thromboembolism based upon his CHA2DS2-VASc Score of 2.  Continue Warfarin (Coumadin).  Continue coumadin as directed.  3. Chronic systolic CHF Euvolemic today.  4. Severe AI Bicuspid AV s/p Bentall. He has a mechanical aortic valve. He is on coumadin.  5. HTN Elevated today; advised to trend at home after coreg increase.    Follow up as scheduled with Dr. Elberta Fortis.   Justin Mend, PA-C Afib Clinic [facility] [address] [place], [place] [address] [phone] 09/18/2023 12:07 PM

## 2023-09-21 ENCOUNTER — Ambulatory Visit: Payer: PRIVATE HEALTH INSURANCE | Attending: Cardiology

## 2023-09-21 ENCOUNTER — Telehealth: Payer: Self-pay

## 2023-09-21 NOTE — Telephone Encounter (Signed)
 Lpmtcb to reschedule INR appt

## 2023-10-19 ENCOUNTER — Encounter: Payer: Self-pay | Admitting: Cardiology

## 2023-10-19 ENCOUNTER — Ambulatory Visit: Payer: Self-pay | Attending: Cardiology | Admitting: Cardiology

## 2023-10-19 VITALS — BP 102/70 | HR 90 | Ht 68.0 in | Wt 180.8 lb

## 2023-10-19 DIAGNOSIS — I10 Essential (primary) hypertension: Secondary | ICD-10-CM

## 2023-10-19 DIAGNOSIS — D6869 Other thrombophilia: Secondary | ICD-10-CM

## 2023-10-19 DIAGNOSIS — I4819 Other persistent atrial fibrillation: Secondary | ICD-10-CM

## 2023-10-19 DIAGNOSIS — I5022 Chronic systolic (congestive) heart failure: Secondary | ICD-10-CM

## 2023-10-19 DIAGNOSIS — Z01812 Encounter for preprocedural laboratory examination: Secondary | ICD-10-CM

## 2023-10-19 DIAGNOSIS — I484 Atypical atrial flutter: Secondary | ICD-10-CM

## 2023-10-19 NOTE — Progress Notes (Signed)
 Electrophysiology Office Note:   Date:  10/19/2023  ID:  [name] Hinton, DOB [birth year], MRN [id number]  Primary Cardiologist: Marca Ancona, MD Primary Heart Failure: Marca Ancona, MD Electrophysiologist: Regan Lemming, MD      History of Present Illness:   [name] Hinton is a 53 y.o. 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   Post atrial fibrillation ablation.  He came back to A-fib clinic in atrial flutter.  His INR was supratherapeutic at the time.  Amiodarone was not restarted as he was elected for rate control and prep for possible ablation.  Since last being seen in our clinic the patient reports doing overall well.  Unfortunately he has gone into atrial flutter.  He continues to be able to do his daily activities with some mild fatigue and shortness of breath.  He would prefer a rhythm control strategy.  he denies chest pain, palpitations, dyspnea, PND, orthopnea, nausea, vomiting, dizziness, syncope, edema, weight gain, or early satiety.   Review of systems complete and found to be negative unless listed in HPI.   EP Information / Studies Reviewed:    EKG is not ordered today. EKG from 09/18/2023 reviewed which showed atrial flutter        Risk Assessment/Calculations:    CHA2DS2-VASc Score = 2   This indicates a 2.2% annual risk of stroke. The patient's score is based upon: CHF History: 1 HTN History: 1 Diabetes History: 0 Stroke History: 0 Vascular Disease History: 0 Age Score: 0 Gender Score: 0            Physical Exam:   VS:  There were no vitals taken for this visit.   Wt Readings from Last 3 Encounters:  09/18/23 180 lb 12.8 oz (82 kg)  03/20/23 187 lb (84.8 kg)  12/18/22 175 lb (79.4 kg)     GEN: Well nourished, well developed in no acute distress NECK:  No JVD; No carotid bruits CARDIAC: Regular rate and rhythm, no murmurs, rubs, gallops RESPIRATORY:  Clear to auscultation without rales, wheezing or rhonchi  ABDOMEN: Soft, non-tender, non-distended EXTREMITIES:  No edema; No deformity   ASSESSMENT AND PLAN:    1.  Persistent atrial fibrillation/flutter: Post ablation 12/18/2022.  He is unfortunately had recurrence with atrial flutter.  He would prefer a rhythm control strategy to long-term medications.  Due to that, we Irfan Veal plan for ablation.  Risks and benefits have been discussed.  He understands the risks and is agreed to the procedure.  Risk, benefits, and alternatives to EP study and radiofrequency/pulse field ablation for afib were also discussed in detail today. These risks include but are not limited to stroke, bleeding, vascular damage, tamponade, perforation, damage to the esophagus, lungs, and other structures, pulmonary vein stenosis, worsening renal function, and death. The patient understands these risk and wishes to proceed.  We Milferd Ansell therefore proceed with catheter ablation at the next available time.  Carto, ICE, anesthesia are requested for the procedure.  Bronnie Vasseur also obtain CT PV protocol prior to the procedure to exclude LAA thrombus and further evaluate atrial anatomy.  2.  Secondary hypercoagulable state: Currently on warfarin for atrial fibrillation  3.  Chronic systolic heart failure: Managed by heart failure cardiology.  No obvious volume overload.  4.  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  On Coumadin.  5.  Hypertension: Currently well-controlled  Follow up with Afib Clinic as usual post procedure  Signed, Kaysey Berndt Jorja Loa, MD

## 2023-10-19 NOTE — Patient Instructions (Signed)
 Medication Instructions:  Your physician recommends that you continue on your current medications as directed. Please refer to the Current Medication list given to you today.  *If you need a refill on your cardiac medications before your next appointment, please call your pharmacy*   Lab Work: Pre procedure labs -- we will call you to schedule:  BMP & CBC  If you have a lab test that is abnormal and we need to change your treatment, we will call you to review the results -- otherwise no news is good news.    Testing/Procedures: Your physician has requested that you have cardiac CT 1 month PRIOR to your ablation. Cardiac computed tomography (CT) is a painless test that uses an x-ray machine to take clear, detailed pictures of your heart. We will contact you if the result is abnormal. We will call you to schedule.  Your physician has recommended that you have a repeat ablation. Catheter ablation is a medical procedure used to treat some cardiac arrhythmias (irregular heartbeats). During catheter ablation, a long, thin, flexible tube is put into a blood vessel in your groin (upper thigh), or neck. This tube is called an ablation catheter. It is then guided to your heart through the blood vessel. Radio frequency waves destroy small areas of heart tissue where abnormal heartbeats may cause an arrhythmia to start.   Your ablation is scheduled for 12/24/2023. Please arrive at [facility] at 10:30 am.  We will call you for further instructions.   Follow-Up: At [facility], you and your health needs are our priority.  As part of our continuing mission to provide you with exceptional heart care, we have created designated Provider Care Teams.  These Care Teams include your primary Cardiologist (physician) and Advanced Practice Providers (APPs -  Physician Assistants and Nurse Practitioners) who all work together to provide you with the care you need, when you need it.  Your next appointment:    1 month(s) after your ablation  The format for your next appointment:   In Person  Provider:   AFib clinic   Thank you for choosing [facility]!!   Dory Horn, RN [phone]    Other Instructions   Cardiac Ablation Cardiac ablation is a procedure to destroy (ablate) some heart tissue that is sending bad signals. These bad signals cause problems in heart rhythm. The heart has many areas that make these signals. If there are problems in these areas, they can make the heart beat in a way that is not normal. Destroying some tissues can help make the heart rhythm normal. Tell your doctor about: Any allergies you have. All medicines you are taking. These include vitamins, herbs, eye drops, creams, and over-the-counter medicines. Any problems you or family members have had with medicines that make you fall asleep (anesthetics). Any blood disorders you have. Any surgeries you have had. Any medical conditions you have, such as kidney failure. Whether you are pregnant or may be pregnant. What are the risks? This is a safe procedure. But problems may occur, including: Infection. Bruising and bleeding. Bleeding into the chest. Stroke or blood clots. Damage to nearby areas of your body. Allergies to medicines or dyes. The need for a pacemaker if the normal system is damaged. Failure of the procedure to treat the problem. What happens before the procedure? Medicines Ask your doctor about: Changing or stopping your normal medicines. This is important. Taking aspirin and ibuprofen. Do not take these medicines unless your doctor tells you  to take them. Taking other medicines, vitamins, herbs, and supplements. General instructions Follow instructions from your doctor about what you cannot eat or drink. Plan to have someone take you home from the hospital or clinic. If you will be going home right after the procedure, plan to have someone with you for 24 hours. Ask your  doctor what steps will be taken to prevent infection. What happens during the procedure?  An IV tube will be put into one of your veins. You will be given a medicine to help you relax. The skin on your neck or groin will be numbed. A cut (incision) will be made in your neck or groin. A needle will be put through your cut and into a large vein. A tube (catheter) will be put into the needle. The tube will be moved to your heart. Dye may be put through the tube. This helps your doctor see your heart. Small devices (electrodes) on the tube will send out signals. A type of energy will be used to destroy some heart tissue. The tube will be taken out. Pressure will be held on your cut. This helps stop bleeding. A bandage will be put over your cut. The exact procedure may vary among doctors and hospitals. What happens after the procedure? You will be watched until you leave the hospital or clinic. This includes checking your heart rate, breathing rate, oxygen, and blood pressure. Your cut will be watched for bleeding. You will need to lie still for a few hours. Do not drive for 24 hours or as long as your doctor tells you. Summary Cardiac ablation is a procedure to destroy some heart tissue. This is done to treat heart rhythm problems. Tell your doctor about any medical conditions you may have. Tell him or her about all medicines you are taking to treat them. This is a safe procedure. But problems may occur. These include infection, bruising, bleeding, and damage to nearby areas of your body. Follow what your doctor tells you about food and drink. You may also be told to change or stop some of your medicines. After the procedure, do not drive for 24 hours or as long as your doctor tells you. This information is not intended to replace advice given to you by your health care provider. Make sure you discuss any questions you have with your health care provider. Document Revised: 09/27/2021 Document  Reviewed: 06/09/2019 Elsevier Patient Education  2023 Elsevier Inc.   Cardiac Ablation, Care After  This sheet gives you information about how to care for yourself after your procedure. Your health care provider may also give you more specific instructions. If you have problems or questions, contact your health care provider. What can I expect after the procedure? After the procedure, it is common to have: Bruising around your puncture site. Tenderness around your puncture site. Skipped heartbeats. If you had an atrial fibrillation ablation, you may have atrial fibrillation during the first several months after your procedure.  Tiredness (fatigue).  Follow these instructions at home: Puncture site care  Follow instructions from your health care provider about how to take care of your puncture site. Make sure you: If present, leave stitches (sutures), skin glue, or adhesive strips in place. These skin closures may need to stay in place for up to 2 weeks. If adhesive strip edges start to loosen and curl up, you may trim the loose edges. Do not remove adhesive strips completely unless your health care provider tells you to do  that. If a large square bandage is present, this may be removed 24 hours after surgery.  Check your puncture site every day for signs of infection. Check for: Redness, swelling, or pain. Fluid or blood. If your puncture site starts to bleed, lie down on your back, apply firm pressure to the area, and contact your health care provider. Warmth. Pus or a bad smell. A pea or small marble sized lump at the site is normal and can take up to three months to resolve.  Driving Do not drive for at least 4 days after your procedure or however long your health care provider recommends. (Do not resume driving if you have previously been instructed not to drive for other health reasons.) Do not drive or use heavy machinery while taking prescription pain medicine. Activity Avoid  activities that take a lot of effort for at least 7 days after your procedure. Do not lift anything that is heavier than 5 lb (4.5 kg) for one week.  No sexual activity for 1 week.  Return to your normal activities as told by your health care provider. Ask your health care provider what activities are safe for you. General instructions Take over-the-counter and prescription medicines only as told by your health care provider. Do not use any products that contain nicotine or tobacco, such as cigarettes and e-cigarettes. If you need help quitting, ask your health care provider. You may shower after 24 hours, but Do not take baths, swim, or use a hot tub for 1 week.  Do not drink alcohol for 24 hours after your procedure. Keep all follow-up visits as told by your health care provider. This is important. Contact a health care provider if: You have redness, mild swelling, or pain around your puncture site. You have fluid or blood coming from your puncture site that stops after applying firm pressure to the area. Your puncture site feels warm to the touch. You have pus or a bad smell coming from your puncture site. You have a fever. You have chest pain or discomfort that spreads to your neck, jaw, or arm. You have chest pain that is worse with lying on your back or taking a deep breath. You are sweating a lot. You feel nauseous. You have a fast or irregular heartbeat. You have shortness of breath. You are dizzy or light-headed and feel the need to lie down. You have pain or numbness in the arm or leg closest to your puncture site. Get help right away if: Your puncture site suddenly swells. Your puncture site is bleeding and the bleeding does not stop after applying firm pressure to the area. These symptoms may represent a serious problem that is an emergency. Do not wait to see if the symptoms will go away. Get medical help right away. Call your local emergency services (911 in the [place] Do not  drive yourself to the hospital. Summary After the procedure, it is normal to have bruising and tenderness at the puncture site in your groin, neck, or forearm. Check your puncture site every day for signs of infection. Get help right away if your puncture site is bleeding and the bleeding does not stop after applying firm pressure to the area. This is a medical emergency. This information is not intended to replace advice given to you by your health care provider. Make sure you discuss any questions you have with your health care provider.

## 2023-10-21 ENCOUNTER — Ambulatory Visit: Payer: Self-pay | Attending: Cardiovascular Disease | Admitting: *Deleted

## 2023-10-21 DIAGNOSIS — I480 Paroxysmal atrial fibrillation: Secondary | ICD-10-CM

## 2023-10-21 DIAGNOSIS — Z5181 Encounter for therapeutic drug level monitoring: Secondary | ICD-10-CM

## 2023-10-21 DIAGNOSIS — Z954 Presence of other heart-valve replacement: Secondary | ICD-10-CM

## 2023-10-21 DIAGNOSIS — I359 Nonrheumatic aortic valve disorder, unspecified: Secondary | ICD-10-CM

## 2023-10-21 LAB — POCT INR: INR: 3.5 — AB (ref 2.0–3.0)

## 2023-10-21 NOTE — Patient Instructions (Signed)
 Description   Continue taking Warfarin 1 tablet daily except for 1/2 tablet on Sundays, Wednesdays, Fridays.  Stay consistent with greens each week (3 per week).  Recheck INR in 4 weeks. Amio stopped   Coumadin clinic [phone]

## 2023-10-22 ENCOUNTER — Telehealth: Payer: Self-pay | Admitting: Licensed Clinical Social Worker

## 2023-10-22 NOTE — Telephone Encounter (Signed)
 H&V Care Navigation CSW Progress Note  Clinical Social Worker contacted patient by phone to f/u on self pay status. Sees coumadin clinic regularly, upcoming ablation scheduled. No answer this morning at [phone]. Left voicemail and will re-attempt again as able.   Patient is participating in a Managed Medicaid Plan:  No, self pay only  SDOH Screenings   Food Insecurity: No Food Insecurity (07/07/2022)  Housing: Low Risk  (07/07/2022)  Transportation Needs: No Transportation Needs (07/07/2022)  Utilities: Not At Risk (07/07/2022)  Social Connections: Unknown (05/03/2023)   Received from [facility]  Tobacco Use: Medium Risk (10/19/2023)    Octavio Graves, MSW, LCSW Clinical Social Worker II [facility] Health Heart/Vascular Care Navigation  [phone]- work cell phone (preferred) [phone]- desk phone

## 2023-10-23 ENCOUNTER — Telehealth: Payer: Self-pay | Admitting: Licensed Clinical Social Worker

## 2023-10-23 NOTE — Telephone Encounter (Signed)
 H&V Care Navigation CSW Progress Note  Clinical Social Worker contacted patient by phone to f/u on self pay status. Sees coumadin clinic regularly, upcoming ablation scheduled. No answer again this morning at [phone]. Left 2nd voicemail and will re-attempt again as able.   Patient is participating in a Managed Medicaid Plan:  No, self pay only  SDOH Screenings   Food Insecurity: No Food Insecurity (07/07/2022)  Housing: Low Risk  (07/07/2022)  Transportation Needs: No Transportation Needs (07/07/2022)  Utilities: Not At Risk (07/07/2022)  Social Connections: Unknown (05/03/2023)   Received from [facility]  Tobacco Use: Medium Risk (10/19/2023)    Octavio Graves, MSW, LCSW Clinical Social Worker II [facility] Health Heart/Vascular Care Navigation  [phone]- work cell phone (preferred) [phone]- desk phone

## 2023-10-27 ENCOUNTER — Telehealth: Payer: Self-pay | Admitting: Licensed Clinical Social Worker

## 2023-10-27 NOTE — Telephone Encounter (Signed)
 H&V Care Navigation CSW Progress Note  Clinical Social Worker contacted patient by phone to f/u on self pay status. Sees coumadin clinic regularly, upcoming ablation scheduled. No answer again this morning at [phone]. Left 3rd voicemail and will mail pt resources for Medicaid/[facility] Financial Assistance. Remain available as needed should pt return my call or care navigation team be recommended for further assistance.    Patient is participating in a Managed Medicaid Plan:  No, self pay only  SDOH Screenings   Food Insecurity: No Food Insecurity (07/07/2022)  Housing: Low Risk  (07/07/2022)  Transportation Needs: No Transportation Needs (07/07/2022)  Utilities: Not At Risk (07/07/2022)  Social Connections: Unknown (05/03/2023)   Received from [facility]  Tobacco Use: Medium Risk (10/19/2023)     Octavio Graves, MSW, LCSW Clinical Social Worker II [facility] Health Heart/Vascular Care Navigation  [phone]- work cell phone (preferred) [phone]- desk phone

## 2023-11-16 ENCOUNTER — Telehealth: Payer: Self-pay | Admitting: *Deleted

## 2023-11-16 NOTE — Telephone Encounter (Signed)
 Called pt and had to leave a message to remind him of the upcoming appointment tomorrow at the new location [address] inside the building and not parking deck.

## 2023-11-17 ENCOUNTER — Other Ambulatory Visit: Payer: Self-pay | Admitting: Cardiology

## 2023-11-17 ENCOUNTER — Ambulatory Visit: Payer: PRIVATE HEALTH INSURANCE | Attending: Internal Medicine

## 2023-11-17 DIAGNOSIS — Z954 Presence of other heart-valve replacement: Secondary | ICD-10-CM

## 2023-11-17 DIAGNOSIS — I359 Nonrheumatic aortic valve disorder, unspecified: Secondary | ICD-10-CM

## 2023-11-17 DIAGNOSIS — I480 Paroxysmal atrial fibrillation: Secondary | ICD-10-CM

## 2023-11-17 DIAGNOSIS — Z5181 Encounter for therapeutic drug level monitoring: Secondary | ICD-10-CM | POA: Diagnosis not present

## 2023-11-17 DIAGNOSIS — Q2381 Bicuspid aortic valve: Secondary | ICD-10-CM

## 2023-11-17 LAB — POCT INR: INR: 1.7 — AB (ref 2.0–3.0)

## 2023-11-17 MED ORDER — SPIRONOLACTONE 25 MG PO TABS: ORAL_TABLET | ORAL | 0 refills | Status: DC

## 2023-11-17 MED ORDER — WARFARIN SODIUM 5 MG PO TABS: ORAL_TABLET | ORAL | 0 refills | Status: DC

## 2023-11-17 NOTE — Patient Instructions (Signed)
 Take 1.5 tablets today and 1 tablet tomorrow then Continue taking Warfarin 1 tablet daily except for 1/2 tablet on Sundays, Wednesdays, Fridays. Ablation 12/24/23  weekly INRs Stay consistent with greens each week (3 per week).  Recheck INR in 2 weeks. Amio stopped   Coumadin  clinic [phone]

## 2023-12-02 ENCOUNTER — Telehealth (HOSPITAL_COMMUNITY): Payer: Self-pay

## 2023-12-02 NOTE — Telephone Encounter (Signed)
 Spoke with patient to complete pre-procedure call.     New medical conditions? No Recent hospitalizations or surgeries? No Started any new medications? No Patient made aware to contact office to inform of any new medications started. Any changes in activities of daily living? No   Pre-procedure testing scheduled: CT and lab work on 12/03/23. Confirmed patient is taking Warfarin daily as directed and will continue taking medication before procedure or it may need to be rescheduled.  Confirmed patient is scheduled for Atrial Fibrillation Ablation, Atrial Flutter Ablation on Thursday, June 5 with Dr. Agatha Horsfall. Instructed patient to arrive at the Main Entrance A at [facility]: [address] [place], [place] [address] and check in at Admitting at 10:30 AM.  Advised of plan to go home the same day and will only stay overnight if medically necessary. You MUST have a responsible adult to drive you home and MUST be with you the first 24 hours after you arrive home or your procedure could be cancelled.  Patient verbalized understanding to information provided and is agreeable to proceed with procedure.

## 2023-12-03 ENCOUNTER — Ambulatory Visit (HOSPITAL_COMMUNITY)
Admission: RE | Admit: 2023-12-03 | Discharge: 2023-12-03 | Disposition: A | Discharge status: Home / Self Care | Payer: Medicaid - Out of State | Source: Ambulatory Visit | Attending: Cardiology | Admitting: Cardiology

## 2023-12-03 ENCOUNTER — Ambulatory Visit (INDEPENDENT_AMBULATORY_CARE_PROVIDER_SITE_OTHER): Payer: Medicaid - Out of State | Admitting: *Deleted

## 2023-12-03 DIAGNOSIS — Z5181 Encounter for therapeutic drug level monitoring: Secondary | ICD-10-CM | POA: Insufficient documentation

## 2023-12-03 DIAGNOSIS — I484 Atypical atrial flutter: Secondary | ICD-10-CM | POA: Insufficient documentation

## 2023-12-03 DIAGNOSIS — I480 Paroxysmal atrial fibrillation: Secondary | ICD-10-CM | POA: Diagnosis present

## 2023-12-03 DIAGNOSIS — I4819 Other persistent atrial fibrillation: Secondary | ICD-10-CM | POA: Diagnosis present

## 2023-12-03 LAB — POCT INR: POC INR: 2.1

## 2023-12-03 LAB — CBC

## 2023-12-03 MED ORDER — IOHEXOL 350 MG/ML SOLN
100.0000 mL | Freq: Once | INTRAVENOUS | Status: AC | PRN
  Administered 2023-12-03: 100 mL via INTRAVENOUS

## 2023-12-03 NOTE — Patient Instructions (Addendum)
  Description   Take 1.5 tablets of warfarin today and then START taking warfarin 1 tablet daily except for 1/2 a tablet on Mondays and Fridays. Ablation 12/24/23  weekly INRs Stay consistent with greens each week (3 per week).  Recheck INR in 2 weeks. Amio stopped   Coumadin  clinic [phone]

## 2023-12-04 ENCOUNTER — Ambulatory Visit: Payer: Self-pay

## 2023-12-04 LAB — CBC
Hematocrit: 36.9 % — ABNORMAL LOW (ref 37.5–51.0)
Hemoglobin: 11.4 g/dL — ABNORMAL LOW (ref 13.0–17.7)
MCH: 30.4 pg (ref 26.6–33.0)
MCHC: 30.9 g/dL — ABNORMAL LOW (ref 31.5–35.7)
MCV: 98 fL — ABNORMAL HIGH (ref 79–97)
Platelets: 280 10*3/uL (ref 150–450)
RBC: 3.75 x10E6/uL — ABNORMAL LOW (ref 4.14–5.80)
RDW: 13.1 % (ref 11.6–15.4)
WBC: 5.8 10*3/uL (ref 3.4–10.8)

## 2023-12-04 LAB — BASIC METABOLIC PANEL WITH GFR
BUN/Creatinine Ratio: 20 (ref 9–20)
BUN: 19 mg/dL (ref 6–24)
CO2: 21 mmol/L (ref 20–29)
Calcium: 9 mg/dL (ref 8.7–10.2)
Chloride: 99 mmol/L (ref 96–106)
Creatinine, Ser: 0.96 mg/dL (ref 0.76–1.27)
Glucose: 103 mg/dL — ABNORMAL HIGH (ref 70–99)
Potassium: 4.7 mmol/L (ref 3.5–5.2)
Sodium: 136 mmol/L (ref 134–144)
eGFR: 96 mL/min/{1.73_m2} (ref >59)

## 2023-12-10 ENCOUNTER — Ambulatory Visit: Payer: PRIVATE HEALTH INSURANCE | Attending: Cardiology | Admitting: *Deleted

## 2023-12-10 DIAGNOSIS — Z5181 Encounter for therapeutic drug level monitoring: Secondary | ICD-10-CM

## 2023-12-10 DIAGNOSIS — I480 Paroxysmal atrial fibrillation: Secondary | ICD-10-CM

## 2023-12-10 LAB — POCT INR: POC INR: 2.5

## 2023-12-10 NOTE — Patient Instructions (Signed)
 Description   Continue  taking warfarin 1 tablet daily except for 1/2 a tablet on Mondays and Fridays. Ablation 12/24/23  weekly INRs Stay consistent with greens each week (3 per week).  Recheck INR in 1 week. Amio stopped   Coumadin  clinic [phone]

## 2023-12-17 ENCOUNTER — Telehealth (HOSPITAL_COMMUNITY): Payer: Self-pay

## 2023-12-17 ENCOUNTER — Ambulatory Visit: Payer: PRIVATE HEALTH INSURANCE | Attending: Cardiology | Admitting: *Deleted

## 2023-12-17 DIAGNOSIS — Z5181 Encounter for therapeutic drug level monitoring: Secondary | ICD-10-CM | POA: Diagnosis not present

## 2023-12-17 DIAGNOSIS — I480 Paroxysmal atrial fibrillation: Secondary | ICD-10-CM | POA: Diagnosis not present

## 2023-12-17 LAB — POCT INR: POC INR: 2.2

## 2023-12-17 NOTE — Patient Instructions (Signed)
 Description   START taking warfarin 1 tablet daily except for 1/2 a tablet on Mondays.  Stay consistent with greens each week (3 per week).  Recheck INR in 1 week. Amio stopped   Coumadin  clinic [phone]

## 2023-12-17 NOTE — Telephone Encounter (Signed)
 Attempted to reach patient to discuss upcoming procedure, no answer. Left VM for patient to return call.

## 2023-12-23 ENCOUNTER — Ambulatory Visit: Payer: PRIVATE HEALTH INSURANCE | Attending: Cardiology | Admitting: Pharmacist

## 2023-12-23 ENCOUNTER — Other Ambulatory Visit (HOSPITAL_COMMUNITY): Payer: Self-pay | Admitting: Cardiology

## 2023-12-23 DIAGNOSIS — Z954 Presence of other heart-valve replacement: Secondary | ICD-10-CM | POA: Diagnosis not present

## 2023-12-23 DIAGNOSIS — I359 Nonrheumatic aortic valve disorder, unspecified: Secondary | ICD-10-CM | POA: Diagnosis not present

## 2023-12-23 DIAGNOSIS — I480 Paroxysmal atrial fibrillation: Secondary | ICD-10-CM

## 2023-12-23 LAB — POCT INR: INR: 3 (ref 2.0–3.0)

## 2023-12-23 NOTE — Pre-Procedure Instructions (Signed)
 Attempted to call patient regarding procedure instructions for tomorrow.  Voice mailbox full unable to leave message.    Below are the following items: Arrival time 1000 Nothing to eat or drink after midnight No meds AM of procedure Responsible person to drive you home and stay with you for 24 hrs  Have you missed any doses of anti-coagulant Coumadin - should be taken once a day, if you have missed any dose please let us  know.

## 2023-12-23 NOTE — Patient Instructions (Signed)
 Description   START taking warfarin 1 tablet daily except for 1/2 a tablet on Mondays.  Stay consistent with greens each week (3 per week).  Recheck INR in 1 week. Amio stopped   Coumadin  clinic [phone]

## 2023-12-24 ENCOUNTER — Other Ambulatory Visit: Payer: Self-pay

## 2023-12-24 ENCOUNTER — Ambulatory Visit (HOSPITAL_COMMUNITY): Payer: PRIVATE HEALTH INSURANCE | Admitting: Anesthesiology

## 2023-12-24 ENCOUNTER — Ambulatory Visit (HOSPITAL_COMMUNITY)
Admission: RE | Admit: 2023-12-24 | Discharge: 2023-12-24 | Disposition: A | Discharge status: Home / Self Care | Payer: PRIVATE HEALTH INSURANCE | Attending: Cardiology | Admitting: Cardiology

## 2023-12-24 ENCOUNTER — Encounter (HOSPITAL_COMMUNITY): Payer: Self-pay | Admitting: Cardiology

## 2023-12-24 ENCOUNTER — Encounter (HOSPITAL_COMMUNITY)
Admission: RE | Disposition: A | Discharge status: Home / Self Care | Payer: PRIVATE HEALTH INSURANCE | Source: Home / Self Care | Attending: Cardiology

## 2023-12-24 DIAGNOSIS — Z87891 Personal history of nicotine dependence: Secondary | ICD-10-CM | POA: Insufficient documentation

## 2023-12-24 DIAGNOSIS — I483 Typical atrial flutter: Secondary | ICD-10-CM

## 2023-12-24 DIAGNOSIS — I110 Hypertensive heart disease with heart failure: Secondary | ICD-10-CM | POA: Insufficient documentation

## 2023-12-24 DIAGNOSIS — I5022 Chronic systolic (congestive) heart failure: Secondary | ICD-10-CM | POA: Diagnosis not present

## 2023-12-24 DIAGNOSIS — I4719 Other supraventricular tachycardia: Secondary | ICD-10-CM | POA: Diagnosis not present

## 2023-12-24 DIAGNOSIS — I4892 Unspecified atrial flutter: Secondary | ICD-10-CM | POA: Diagnosis not present

## 2023-12-24 DIAGNOSIS — I4819 Other persistent atrial fibrillation: Secondary | ICD-10-CM | POA: Diagnosis not present

## 2023-12-24 DIAGNOSIS — I4891 Unspecified atrial fibrillation: Secondary | ICD-10-CM

## 2023-12-24 DIAGNOSIS — R000 Tachycardia, unspecified: Secondary | ICD-10-CM

## 2023-12-24 HISTORY — PX: A-FLUTTER ABLATION: EP1230

## 2023-12-24 HISTORY — PX: ATRIAL FIBRILLATION ABLATION: EP1191

## 2023-12-24 LAB — PROTIME-INR
INR: 2.8 — ABNORMAL HIGH (ref 0.8–1.2)
Prothrombin Time: 29.6 s — ABNORMAL HIGH (ref 11.4–15.2)

## 2023-12-24 LAB — POCT ACTIVATED CLOTTING TIME: Activated Clotting Time: 268 s

## 2023-12-24 MED ORDER — ONDANSETRON HCL 4 MG/2ML IJ SOLN: 4.0000 mg | Freq: Four times a day (QID) | INTRAMUSCULAR | Status: DC | PRN

## 2023-12-24 MED ORDER — HEPARIN SODIUM (PORCINE) 1000 UNIT/ML IJ SOLN
INTRAMUSCULAR | Status: AC
  Filled 2023-12-24: qty 20

## 2023-12-24 MED ORDER — SUGAMMADEX SODIUM 200 MG/2ML IV SOLN
INTRAVENOUS | Status: DC | PRN
  Administered 2023-12-24: 163.2 mg via INTRAVENOUS

## 2023-12-24 MED ORDER — ROCURONIUM BROMIDE 10 MG/ML (PF) SYRINGE
PREFILLED_SYRINGE | INTRAVENOUS | Status: DC | PRN
  Administered 2023-12-24: 50 mg via INTRAVENOUS
  Administered 2023-12-24: 20 mg via INTRAVENOUS

## 2023-12-24 MED ORDER — PROTAMINE SULFATE 10 MG/ML IV SOLN
INTRAVENOUS | Status: DC | PRN
  Administered 2023-12-24: 40 mg via INTRAVENOUS

## 2023-12-24 MED ORDER — HEPARIN SODIUM (PORCINE) 1000 UNIT/ML IJ SOLN
INTRAMUSCULAR | Status: DC | PRN
Start: 2023-12-24 — End: 2023-12-24
  Administered 2023-12-24: 14000 [IU] via INTRAVENOUS
  Administered 2023-12-24: 4000 [IU] via INTRAVENOUS

## 2023-12-24 MED ORDER — MIDAZOLAM HCL 2 MG/2ML IJ SOLN
INTRAMUSCULAR | Status: AC
  Filled 2023-12-24: qty 2

## 2023-12-24 MED ORDER — ONDANSETRON HCL 4 MG/2ML IJ SOLN
INTRAMUSCULAR | Status: DC | PRN
Start: 2023-12-24 — End: 2023-12-24
  Administered 2023-12-24: 4 mg via INTRAVENOUS

## 2023-12-24 MED ORDER — MIDAZOLAM HCL 2 MG/2ML IJ SOLN
INTRAMUSCULAR | Status: DC | PRN
  Administered 2023-12-24: 2 mg via INTRAVENOUS

## 2023-12-24 MED ORDER — PROPOFOL 10 MG/ML IV BOLUS
INTRAVENOUS | Status: DC | PRN
Start: 2023-12-24 — End: 2023-12-24
  Administered 2023-12-24: 130 mg via INTRAVENOUS

## 2023-12-24 MED ORDER — HEPARIN (PORCINE) IN NACL 1000-0.9 UT/500ML-% IV SOLN
INTRAVENOUS | Status: DC | PRN
Start: 2023-12-24 — End: 2023-12-24
  Administered 2023-12-24 (×3): 500 mL

## 2023-12-24 MED ORDER — ATROPINE SULFATE 1 MG/ML IV SOLN
INTRAVENOUS | Status: DC | PRN
Start: 2023-12-24 — End: 2023-12-24
  Administered 2023-12-24: 1 mg via INTRAVENOUS

## 2023-12-24 MED ORDER — LIDOCAINE 2% (20 MG/ML) 5 ML SYRINGE
INTRAMUSCULAR | Status: DC | PRN
  Administered 2023-12-24: 60 mg via INTRAVENOUS

## 2023-12-24 MED ORDER — ACETAMINOPHEN 325 MG PO TABS: 650.0000 mg | ORAL_TABLET | ORAL | Status: DC | PRN

## 2023-12-24 MED ORDER — DEXAMETHASONE SODIUM PHOSPHATE 10 MG/ML IJ SOLN
INTRAMUSCULAR | Status: DC | PRN
  Administered 2023-12-24: 10 mg via INTRAVENOUS

## 2023-12-24 MED ORDER — PHENYLEPHRINE HCL-NACL 20-0.9 MG/250ML-% IV SOLN
INTRAVENOUS | Status: DC | PRN
  Administered 2023-12-24: 10 ug/min via INTRAVENOUS

## 2023-12-24 MED ORDER — SODIUM CHLORIDE 0.9 % IV SOLN: INTRAVENOUS | Status: DC

## 2023-12-24 NOTE — Anesthesia Preprocedure Evaluation (Addendum)
 Anesthesia Evaluation  Patient identified by MRN, date of birth, ID band Patient awake    Reviewed: Allergy & Precautions, H&P , NPO status , Patient's Chart, lab work & pertinent test results  Airway Mallampati: II  TM Distance: >3 FB Neck ROM: Full    Dental no notable dental hx.    Pulmonary former smoker   Pulmonary exam normal breath sounds clear to auscultation       Cardiovascular hypertension, Normal cardiovascular exam+ dysrhythmias Atrial Fibrillation  Rhythm:Regular Rate:Normal  S/P Bentall aortic root replacement with mechanical valve conduit and maze procedure3/10/201529 mm Sorin Carbomedics Carbo-seal Valsalve mechanical valve conduit  Complete bilateral atrial lesion set using bipolar radiofrequency and cryothermy ablation with clipping of LA appendage   02/2022: IMPRESSIONS     1. Left ventricular ejection fraction, by estimation, is 50 to 55%. The  left ventricle has low normal function. The left ventricle has no regional  wall motion abnormalities.   2. Right ventricular systolic function is normal. The right ventricular  size is normal.   3. The left atrial appendage has been ligated. Left atrial size was  mildly dilated. No left atrial/left atrial appendage thrombus was  detected.   4. Right atrial size was mildly dilated.   5. Peak RV-RA gradient 11 mmHg.   6. The mitral valve is normal in structure. Trivial mitral valve  regurgitation. No evidence of mitral stenosis.   7. Mechanical aortic valve with mean gradient 6 mmHg. Mild perivalvular  leakage. AoV function appears normal.   8. No PFO or ASD by color doppler.   9. Minimal plaque in thoracic aorta. Aortic dilatation noted. There is  mild dilatation of the ascending aorta, measuring 43 mm.     Neuro/Psych negative neurological ROS  negative psych ROS   GI/Hepatic negative GI ROS,,,(+)     substance abuse  alcohol use  Endo/Other  negative  endocrine ROS    Renal/GU negative Renal ROS  negative genitourinary   Musculoskeletal negative musculoskeletal ROS (+)    Abdominal   Peds negative pediatric ROS (+)  Hematology  (+) Blood dyscrasia, anemia   Anesthesia Other Findings EF 50-55% 2023  Reproductive/Obstetrics negative OB ROS                              Anesthesia Physical Anesthesia Plan  ASA: 3  Anesthesia Plan: General   Post-op Pain Management: Minimal or no pain anticipated   Induction: Intravenous  PONV Risk Score and Plan: 2 and Ondansetron , Dexamethasone  and Treatment may vary due to age or medical condition  Airway Management Planned: Oral ETT  Additional Equipment:   Intra-op Plan:   Post-operative Plan: Extubation in OR  Informed Consent: I have reviewed the patients History and Physical, chart, labs and discussed the procedure including the risks, benefits and alternatives for the proposed anesthesia with the patient or authorized representative who has indicated his/her understanding and acceptance.     Dental advisory given  Plan Discussed with: CRNA and Surgeon  Anesthesia Plan Comments:         Anesthesia Quick Evaluation

## 2023-12-24 NOTE — Progress Notes (Signed)
 Up and walked and tolerated well; no further oozing noted

## 2023-12-24 NOTE — H&P (Signed)
  Electrophysiology Office Note:   Date:  12/24/2023  ID:  RANJIT ASHURST, DOB 04/21/1971, MRN [id number]  Primary Cardiologist: Peder Bourdon, MD Primary Heart Failure: Peder Bourdon, MD Electrophysiologist: Lei Pump, MD      History of Present Illness:   [name] is a 53 y.o. 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   Post atrial fibrillation ablation.  He came back to A-fib clinic in atrial flutter.  His INR was supratherapeutic at the time.  Amiodarone  was not restarted as he was elected for rate control and prep for possible ablation.  Today, denies symptoms of palpitations, chest pain, dyspnea, orthopnea, PND, lower extremity edema, claudication, dizziness, presyncope, syncope, bleeding, or neurologic sequela. The patient is tolerating medications without difficulties. Plan ablation today.   EP Information / Studies Reviewed:    EKG is not ordered today. EKG from 09/18/2023 reviewed which showed atrial flutter        Risk Assessment/Calculations:    CHA2DS2-VASc Score = 2   This indicates a 2.2% annual risk of stroke. The patient's score is based upon: CHF History: 1 HTN History: 1 Diabetes History: 0 Stroke History: 0 Vascular Disease History: 0 Age Score: 0 Gender Score: 0            Physical Exam:   VS:  BP (!) 138/98   Pulse (!) 101   Temp 98.1 F (36.7 C) (Oral)   Resp 20   Ht 5\' 8"  (1.727 m)   Wt 81.6 kg   SpO2 100%   BMI 27.37 kg/m    Wt Readings from Last 3 Encounters:  12/24/23 81.6 kg  10/19/23 82 kg  09/18/23 82 kg    GEN: No acute distress.   Neck: No JVD Cardiac: RRR, no murmurs, rubs, or gallops.  Respiratory: normal BS bilaterally. GI: Soft, nontender, non-distended  MS: No edema; No deformity. Neuro:  Nonfocal  Skin: warm and  dry Psych: Normal affect    ASSESSMENT AND PLAN:    1.  Persistent atrial fibrillation/flutter: NITISH ROES has presented today for surgery, with the diagnosis of AF.  The various methods of treatment have been discussed with the patient and family. After consideration of risks, benefits and other options for treatment, the patient has consented to  Procedure(s): Catheter ablation as a surgical intervention .  Risks include but not limited to complete heart block, stroke, esophageal damage, nerve damage, bleeding, vascular damage, tamponade, perforation, MI, and death. The patient's history has been reviewed, patient examined, no change in status, stable for surgery.  I have reviewed the patient's chart and labs.  Questions were answered to the patient's satisfaction.    Kaneisha Ellenberger Lawana Pray, MD 12/24/2023 11:23 AM

## 2023-12-24 NOTE — Transfer of Care (Signed)
 Immediate Anesthesia Transfer of Care Note  Patient: [name]  Procedure(s) Performed: ATRIAL FIBRILLATION ABLATION A-FLUTTER ABLATION  Patient Location: PACU  Anesthesia Type:General  Level of Consciousness: drowsy  Airway & Oxygen Therapy: Patient Spontanous Breathing  Post-op Assessment: Report given to RN and Post -op Vital signs reviewed and stable  Post vital signs: Reviewed and stable  Last Vitals:  Vitals Value Taken Time  BP 94/62   Temp 97.6   Pulse 62   Resp 22   SpO2 93     Last Pain:  Vitals:   12/24/23 1024  TempSrc:   PainSc: 0-No pain         Complications: There were no known notable events for this encounter.

## 2023-12-24 NOTE — Discharge Instructions (Signed)

## 2023-12-24 NOTE — Anesthesia Postprocedure Evaluation (Signed)
 Anesthesia Post Note  Patient: [name]  Procedure(s) Performed: ATRIAL FIBRILLATION ABLATION A-FLUTTER ABLATION     Patient location during evaluation: PACU Anesthesia Type: General Level of consciousness: awake and alert Pain management: pain level controlled Vital Signs Assessment: post-procedure vital signs reviewed and stable Respiratory status: spontaneous breathing, nonlabored ventilation, respiratory function stable and patient connected to nasal cannula oxygen Cardiovascular status: blood pressure returned to baseline and stable Postop Assessment: no apparent nausea or vomiting Anesthetic complications: no   There were no known notable events for this encounter.  Last Vitals:  Vitals:   12/24/23 1430 12/24/23 1435  BP: 96/62 (!) 86/60  Pulse: 60 60  Resp: (!) 22 20  Temp: 36.4 C   SpO2: 99% 96%    Last Pain:  Vitals:   12/24/23 1430  TempSrc: Temporal  PainSc: 0-No pain                 Lethaniel Rave

## 2023-12-24 NOTE — Anesthesia Procedure Notes (Addendum)
 Procedure Name: Intubation Date/Time: 12/24/2023 12:17 PM  Performed by: Bennett Brass, CRNAPre-anesthesia Checklist: Patient identified, Emergency Drugs available, Suction available and Patient being monitored Patient Re-evaluated:Patient Re-evaluated prior to induction Oxygen Delivery Method: Circle System Utilized Preoxygenation: Pre-oxygenation with 100% oxygen Induction Type: IV induction Ventilation: Mask ventilation without difficulty Laryngoscope Size: Miller and 2 Grade View: Grade I Tube type: Oral Number of attempts: 1 Airway Equipment and Method: Stylet and Oral airway Placement Confirmation: ETT inserted through vocal cords under direct vision, positive ETCO2 and breath sounds checked- equal and bilateral Secured at: 22 cm Tube secured with: Tape Dental Injury: Teeth and Oropharynx as per pre-operative assessment

## 2023-12-24 NOTE — Progress Notes (Signed)
 Up and walked; left groin with oozing noted; dressing changed and no further oozing noted; up again and oozing noted; Dr Lawana Pray notified and per Dr Lawana Pray additional hour of bedrest; pressure held x 15 min and dressing applied; no further oozing at present

## 2023-12-25 ENCOUNTER — Encounter: Payer: Self-pay | Admitting: Emergency Medicine

## 2023-12-25 ENCOUNTER — Telehealth (HOSPITAL_COMMUNITY): Payer: Self-pay

## 2023-12-25 NOTE — Telephone Encounter (Signed)
 Attempted to reach patient to follow up with procedure completed on 12/24/23, no answer. Left VM for patient to return call.

## 2024-01-06 ENCOUNTER — Telehealth: Payer: Self-pay | Admitting: *Deleted

## 2024-01-06 ENCOUNTER — Ambulatory Visit: Payer: PRIVATE HEALTH INSURANCE | Attending: Cardiology | Admitting: *Deleted

## 2024-01-06 DIAGNOSIS — I480 Paroxysmal atrial fibrillation: Secondary | ICD-10-CM | POA: Diagnosis not present

## 2024-01-06 DIAGNOSIS — Z954 Presence of other heart-valve replacement: Secondary | ICD-10-CM | POA: Diagnosis not present

## 2024-01-06 DIAGNOSIS — I359 Nonrheumatic aortic valve disorder, unspecified: Secondary | ICD-10-CM

## 2024-01-06 LAB — POCT INR: INR: 1.9 — AB (ref 2.0–3.0)

## 2024-01-06 NOTE — Patient Instructions (Signed)
 Description   Today take 1.5 tablets of warfarin then continue taking warfarin 1 tablet daily except for 1/2 tablet on Mondays.  Stay consistent with greens each week (3 per week).  Recheck INR in 1 week. Amio stopped   Coumadin  clinic [phone]

## 2024-01-06 NOTE — Progress Notes (Signed)
 See anticoag note

## 2024-01-06 NOTE — Telephone Encounter (Signed)
 Patient states he would like to speak to Dr. Lawana Pray or his nurse about an issue post his ablation procedure. He can be reached via cell phone at [phone]; thanks in advance.

## 2024-01-07 NOTE — Telephone Encounter (Signed)
 Left message to call back

## 2024-01-13 ENCOUNTER — Ambulatory Visit: Payer: PRIVATE HEALTH INSURANCE | Attending: Cardiology | Admitting: *Deleted

## 2024-01-13 DIAGNOSIS — I480 Paroxysmal atrial fibrillation: Secondary | ICD-10-CM | POA: Diagnosis not present

## 2024-01-13 DIAGNOSIS — Z954 Presence of other heart-valve replacement: Secondary | ICD-10-CM

## 2024-01-13 DIAGNOSIS — I359 Nonrheumatic aortic valve disorder, unspecified: Secondary | ICD-10-CM | POA: Diagnosis not present

## 2024-01-13 LAB — POCT INR: INR: 2.4 (ref 2.0–3.0)

## 2024-01-13 NOTE — Patient Instructions (Signed)
 Description   Today take 1.5 tablets of warfarin then START taking warfarin 1 tablet daily.   Stay consistent with greens each week (3 per week).  Recheck INR in 2 weeks. Amio stopped   Coumadin  clinic [phone]

## 2024-01-13 NOTE — Progress Notes (Signed)
Please see anticoagulation encounter.

## 2024-01-14 ENCOUNTER — Other Ambulatory Visit: Payer: Self-pay | Admitting: Cardiology

## 2024-01-14 NOTE — Telephone Encounter (Signed)
 Left message informing pt to call office back if he still needed to talk.  Aware I would close this note since we have not heard back from him, but to call if he still needed anything/questions.

## 2024-01-25 ENCOUNTER — Ambulatory Visit (HOSPITAL_COMMUNITY): Payer: PRIVATE HEALTH INSURANCE | Admitting: Internal Medicine

## 2024-01-29 ENCOUNTER — Ambulatory Visit: Payer: PRIVATE HEALTH INSURANCE | Attending: Cardiology

## 2024-01-29 DIAGNOSIS — I480 Paroxysmal atrial fibrillation: Secondary | ICD-10-CM | POA: Diagnosis not present

## 2024-01-29 LAB — POCT INR: INR: 3.8 — AB (ref 2.0–3.0)

## 2024-01-29 NOTE — Progress Notes (Signed)
Please see anticoagulation encounter.

## 2024-01-29 NOTE — Patient Instructions (Signed)
 Description   Eat greens today and continue taking warfarin 1 tablet daily.   Stay consistent with greens each week (4 per week).  Recheck INR in 3 weeks. Amio stopped   Coumadin  clinic [phone]

## 2024-02-04 ENCOUNTER — Encounter (HOSPITAL_COMMUNITY): Payer: Self-pay

## 2024-02-05 ENCOUNTER — Other Ambulatory Visit: Payer: Self-pay | Admitting: Cardiology

## 2024-02-05 ENCOUNTER — Ambulatory Visit (HOSPITAL_COMMUNITY): Payer: PRIVATE HEALTH INSURANCE | Admitting: Internal Medicine

## 2024-02-05 DIAGNOSIS — Q2381 Bicuspid aortic valve: Secondary | ICD-10-CM

## 2024-02-05 DIAGNOSIS — I480 Paroxysmal atrial fibrillation: Secondary | ICD-10-CM

## 2024-02-19 ENCOUNTER — Ambulatory Visit: Payer: PRIVATE HEALTH INSURANCE | Attending: Cardiology

## 2024-02-19 DIAGNOSIS — I480 Paroxysmal atrial fibrillation: Secondary | ICD-10-CM | POA: Diagnosis not present

## 2024-02-19 DIAGNOSIS — Z5181 Encounter for therapeutic drug level monitoring: Secondary | ICD-10-CM | POA: Diagnosis not present

## 2024-02-19 DIAGNOSIS — I359 Nonrheumatic aortic valve disorder, unspecified: Secondary | ICD-10-CM

## 2024-02-19 DIAGNOSIS — Z954 Presence of other heart-valve replacement: Secondary | ICD-10-CM | POA: Diagnosis not present

## 2024-02-19 LAB — POCT INR: INR: 2.5 (ref 2.0–3.0)

## 2024-02-19 NOTE — Progress Notes (Signed)
 INR 2.5. Please see anticoagulation encounter

## 2024-02-19 NOTE — Patient Instructions (Signed)
 continue taking warfarin 1 tablet daily.   Stay consistent with greens each week (4 per week).  Recheck INR in 5 weeks. Amio stopped   Coumadin  clinic [phone]

## 2024-02-26 ENCOUNTER — Encounter (HOSPITAL_COMMUNITY): Payer: Self-pay

## 2024-02-26 ENCOUNTER — Ambulatory Visit (HOSPITAL_COMMUNITY)
Admission: RE | Admit: 2024-02-26 | Payer: PRIVATE HEALTH INSURANCE | Source: Ambulatory Visit | Admitting: Internal Medicine

## 2024-02-26 NOTE — Progress Notes (Incomplete)
 Primary Care Physician: Verdia Lombard, MD Primary Cardiologist: Dr Rolan Primary Electrophysiologist: Dr Inocencio Referring Physician: Dr Inocencio [name] is a 53 y.o. male with a history of alcohol abuse, tobacco use, bicuspid AV s/p Bentall and MAZE, systolic CHF, HTN, OSA, atrial flutter, atrial fibrillation who presents for follow up in the [name]. He was hospitalized 2015 with heart failure and severe aortic insufficiency.  Found to have atrial flutter.  He was found to have a bicuspid aortic valve and ascending aortic aneurysm.  He is status post Bentall procedure with ascending aortic aneurysm and arch replacement with aortic valve replacement March 2015.  He had a maze at the time with left atrial appendage occlusion.  He has had episodes of atypical atrial flutter.  He was initially on amiodarone .   In 2018 he was admitted for lower GI bleed.  This suspected due to hemorrhoids.  2019 his ejection fraction improved to normal.   July 2023, he went back into atrial flutter.  He had a TEE and cardioversion for atypical atrial flutter.  Amiodarone  was resumed. Patient is on warfarin for a CHADS2VASC score of 2. He was seen by Dr Inocencio who recommended ablation.  He was hospitalized again for acute anemia, hgb 6.6. Fecal occult was positive and he received 2 units PRBC. Patient deferred colonoscopy to do as an outpatient.   On follow up today, patient reports that he feels well. He remains in SR. No recent bleeding issues. Patient is scheduled for ablation 08/13/22 with Dr Inocencio.   On follow up 09/18/23, patient is currently in atrial flutter. Review of phone notes show patient was in anticoagulation clinic 08/31/23 and had a supra therapeutic INR of 5.6. He noted that he believes he has been in Afib since November/December 2024 and on his own restarted amiodarone  2 months ago. He began taking amiodarone  200 mg once daily in December and  stopped taking it 2 weeks ago. He felt like it wasn't really helping and that's why he stopped when pharmacist noted his INR level was elevated. He is on coumadin .  On follow up 02/26/24, patient is currently in ***. S/p Afib/flutter/atrial tach ablation on 12/24/23 by Dr. Inocencio. No episodes of Afib since ablation. No chest pain or SOB. Leg sites healed without issue. Patient is on coumadin .  Today, he denies symptoms of orthopnea, PND, lower extremity edema, dizziness, presyncope, syncope, snoring, daytime somnolence, bleeding, or neurologic sequela. The patient is tolerating medications without difficulties and is otherwise without complaint today.     Atrial Fibrillation Risk Factors:  he does have symptoms or diagnosis of sleep apnea. he is waiting for CPAP. he does not have a history of rheumatic fever. he does have a history of alcohol use.   he has a BMI of There is no height or weight on file to calculate BMI.. There were no vitals filed for this visit.    Family History  Problem Relation Age of Onset   Heart failure Father    Coronary artery disease Father        CABG    Atrial Fibrillation Management history:  Previous antiarrhythmic drugs: amiodarone  Previous cardioversions: 2015 x 2, 11/15/21, 02/14/22, 03/10/22 Previous ablations: 12/18/22, 12/24/23 CHADS2VASC score: 2 Anticoagulation history: warfarin   Past Medical History:  Diagnosis Date   Dysrhythmia    ETOH abuse    Heart murmur    Hx of repair of ascending aorta 09/27/2013   Non-ischemic cardiomyopathy (  HCC)    PAF (paroxysmal atrial fibrillation) (HCC)    a. Dx 07/2013, s/p TEE/DCCV.   Paroxysmal atrial flutter (HCC)    a. Dx 07/2013.   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 Bicuspid aortic valve with severe AI, dilated ascending aorta dx 07/2013.   Systolic CHF (HCC)    a. Dx 07/2013: EF 25%, felt likely related to combo of EtOH + tachy-mediated + AI. LHC pending.   Past Surgical History:  Procedure Laterality Date   A-FLUTTER ABLATION N/A 12/24/2023   Procedure: A-FLUTTER ABLATION;  Surgeon: Inocencio Soyla Lunger, MD;  Location: [facility];  Service: Cardiovascular;  Laterality: N/A;   AORTIC VALVE REPLACEMENT  2015   (using a Carbo medics Carbo-Seal Valsalva, valve size 29 mm)   ASCENDING AORTIC ROOT REPLACEMENT N/A 09/27/2013   Procedure: ASCENDING AORTIC ROOT REPLACEMENT;  Surgeon: Sudie VEAR Laine, MD;  Location: [facility] OR;  Service: Open Heart Surgery;  Laterality: N/A;   ATRIAL FIBRILLATION ABLATION N/A 12/18/2022   Procedure: ATRIAL FIBRILLATION ABLATION;  Surgeon: Inocencio Soyla Lunger, MD;  Location: [facility];  Service: Cardiovascular;  Laterality: N/A;   ATRIAL FIBRILLATION ABLATION N/A 12/24/2023   Procedure: ATRIAL FIBRILLATION ABLATION;  Surgeon: Inocencio Soyla Lunger, MD;  Location: [facility];  Service: Cardiovascular;  Laterality: N/A;   BENTALL PROCEDURE N/A 09/27/2013   Procedure: BENTALL PROCEDURE;  Surgeon: Sudie VEAR Laine, MD;  Location: [facility] OR;  Service: Open Heart Surgery;  Laterality: N/A;   CARDIAC CATHETERIZATION     CARDIOVERSION N/A 08/01/2013   Procedure: CARDIOVERSION;  Surgeon: Ezra GORMAN Shuck, MD;  Location: [facility] ENDOSCOPY;  Service: Cardiovascular;  Laterality: N/A;   CARDIOVERSION N/A 11/15/2021   Procedure: CARDIOVERSION;  Surgeon: Shuck Ezra GORMAN, MD;  Location: [facility] ENDOSCOPY;  Service: Cardiovascular;  Laterality: N/A;   CARDIOVERSION N/A 02/14/2022   Procedure: CARDIOVERSION;  Surgeon: Shuck Ezra GORMAN, MD;  Location: [facility] ENDOSCOPY;  Service: Cardiovascular;  Laterality: N/A;   CARDIOVERSION N/A 03/10/2022    Procedure: CARDIOVERSION;  Surgeon: Shuck Ezra GORMAN, MD;  Location: [facility] ENDOSCOPY;  Service: Cardiovascular;  Laterality: N/A;   COLONOSCOPY WITH PROPOFOL  Left 10/10/2016   Procedure: COLONOSCOPY WITH PROPOFOL ;  Surgeon: Elsie Cree, MD;  Location: [facility] ENDOSCOPY;  Service: Endoscopy;  Laterality: Left;   INTRAOPERATIVE TRANSESOPHAGEAL ECHOCARDIOGRAM N/A 09/27/2013   Procedure: INTRAOPERATIVE TRANSESOPHAGEAL ECHOCARDIOGRAM;  Surgeon: Sudie VEAR Laine, MD;  Location: [facility] OR;  Service: Open Heart Surgery;  Laterality: N/A;   LEFT AND RIGHT HEART CATHETERIZATION WITH CORONARY ANGIOGRAM N/A 09/05/2013   Procedure: LEFT AND RIGHT HEART CATHETERIZATION WITH CORONARY ANGIOGRAM;  Surgeon: Ezra GORMAN Shuck, MD;  Location: [facility] CATH LAB;  Service: Cardiovascular;  Laterality: N/A;   MAZE N/A 09/27/2013   Procedure: MAZE;  Surgeon: Sudie VEAR Laine, MD;  Location: [facility] OR;  Service: Open Heart Surgery;  Laterality: N/A;   NO PAST SURGERIES     TEE WITHOUT CARDIOVERSION N/A 08/01/2013   Procedure: TRANSESOPHAGEAL ECHOCARDIOGRAM (TEE);  Surgeon: Ezra GORMAN Shuck, MD;  Location: [facility] ENDOSCOPY;  Service: Cardiovascular;  Laterality: N/A;   TEE WITHOUT CARDIOVERSION N/A 11/15/2021   Procedure: TRANSESOPHAGEAL ECHOCARDIOGRAM (TEE);  Surgeon: Shuck Ezra GORMAN, MD;  Location:  [facility] ENDOSCOPY;  Service: Cardiovascular;  Laterality: N/A;   TEE WITHOUT CARDIOVERSION N/A 03/10/2022   Procedure: TRANSESOPHAGEAL ECHOCARDIOGRAM (TEE);  Surgeon: Rolan Ezra RAMAN, MD;  Location: [facility] ENDOSCOPY;  Service: Cardiovascular;  Laterality: N/A;    Current Outpatient Medications  Medication Sig Dispense Refill   aspirin  EC 81 MG tablet Take 1 tablet (81 mg total) by mouth daily. 90 tablet 3   carvedilol  (COREG ) 25 MG tablet Take 1 tablet (25 mg total) by mouth 2 (two) times daily with a meal. 60 tablet 3   Ferrous Gluconate (IRON 27 PO) Take 27 mg by mouth daily.     furosemide  (LASIX ) 20 MG tablet Take 1 tablet (20 mg total) by mouth as  needed. 45 tablet 1   potassium chloride  SA (KLOR-CON  M) 20 MEQ tablet Take 1 tablet (20 mEq total) by mouth as needed. 20 tablet 4   spironolactone  (ALDACTONE ) 25 MG tablet TAKE 1 TABLET(25 MG) BY MOUTH DAILY, NEEDS FOLLOW UP APPOINTMENT FOR MORE REFILLS 30 tablet 6   valsartan  (DIOVAN ) 80 MG tablet Take 1 tablet (80 mg total) by mouth 2 (two) times daily. NEEDS FOLLOW UP APPOINTMENT FOR MORE REFILLS 180 tablet 0   warfarin (COUMADIN ) 5 MG tablet TAKE 1/2 TABLET TO 1 TABLET BY MOUTH DAILY AS DIRECTED BY COUMADIN  CLINIC 75 tablet 0   No current facility-administered medications for this visit.    Allergies  Allergen Reactions   Morphine  And Codeine Nausea Only   ROS- All systems are reviewed and negative except as per the HPI above.  Physical Exam: There were no vitals filed for this visit.  GEN- The patient is well appearing, alert and oriented x 3 today.   Neck - no JVD or carotid bruit noted Lungs- Clear to ausculation bilaterally, normal work of breathing Heart- ***Regular rate and rhythm, no murmurs, rubs or gallops, PMI not laterally displaced Extremities- no clubbing, cyanosis, or edema Skin - no rash or ecchymosis noted   Wt Readings from Last 3 Encounters:  12/24/23 81.6 kg  10/19/23 82 kg  09/18/23 82 kg    EKG today demonstrates  ***  Echo 05/14/20 demonstrated   1. Distal septal hypokinesis . Left ventricular ejection fraction, by  estimation, is 50 to 55%. The left ventricle has low normal function. The  left ventricle has no regional wall motion abnormalities. Left ventricular  diastolic parameters were normal.   2. Right ventricular systolic function is normal. The right ventricular  size is normal. There is normal pulmonary artery systolic pressure.   3. Left atrial size was moderately dilated.   4. Right atrial size was moderately dilated.   5. The mitral valve is normal in structure. Trivial mitral valve  regurgitation. No evidence of mitral stenosis.    6. Post Bental with mechanical AVR normal function no PVL/AR mean  gradient stable since previous echo done November 2019 Trivial closing  volume AR. The aortic valve has been repaired/replaced. Aortic valve  regurgitation is trivial. No aortic stenosis is   present. There is a 29 mm Sorin Carbomedics mechanical valve present in  the aortic position. Procedure Date: 09/27/2013.   7. The inferior vena cava is normal in size with greater than 50%  respiratory variability, suggesting right atrial pressure of 3 mmHg.   Epic records are reviewed at length today  CHA2DS2-VASc Score = 2  The patient's score is based upon: CHF History: 1 HTN History: 1 Diabetes History: 0 Stroke History: 0 Vascular Disease History: 0 Age Score:  0 Gender Score: 0       ASSESSMENT AND PLAN: 1. Persistent Atrial Fibrillation/atrial flutter/atrial tachycardia The patient's CHA2DS2-VASc score is 2, indicating a 2.2% annual risk of stroke.   S/p Afib ablation on 12/18/22 by Dr. Inocencio.  S/p Afib/flutter ablation on 12/24/23 by Dr. Inocencio.  Patient is currently in ***.  2. Secondary Hypercoagulable State (ICD10:  D68.69) The patient is at significant risk for stroke/thromboembolism based upon his CHA2DS2-VASc Score of 2.  Continue Warfarin (Coumadin ).  Continue coumadin  as directed.  3. Chronic systolic CHF Euvolemic today.  4. Severe AI Bicuspid AV s/p Bentall. He has a mechanical aortic valve. He is on coumadin .  5. HTN Stable today.    Follow up with EP as scheduled.    Dorn Heinrich, PA-C Afib [facility] [address] [place], [place] [address] [phone] 02/26/2024 9:30 AM

## 2024-03-25 ENCOUNTER — Ambulatory Visit (INDEPENDENT_AMBULATORY_CARE_PROVIDER_SITE_OTHER): Payer: PRIVATE HEALTH INSURANCE | Admitting: Pharmacist

## 2024-03-25 ENCOUNTER — Encounter (HOSPITAL_COMMUNITY): Payer: Self-pay | Admitting: Internal Medicine

## 2024-03-25 ENCOUNTER — Ambulatory Visit
Admission: RE | Admit: 2024-03-25 | Discharge: 2024-03-25 | Disposition: A | Discharge status: Home / Self Care | Payer: PRIVATE HEALTH INSURANCE | Source: Ambulatory Visit | Attending: Internal Medicine | Admitting: Internal Medicine

## 2024-03-25 VITALS — BP 126/90 | HR 79 | Ht 68.0 in | Wt 174.8 lb

## 2024-03-25 DIAGNOSIS — Z954 Presence of other heart-valve replacement: Secondary | ICD-10-CM | POA: Diagnosis not present

## 2024-03-25 DIAGNOSIS — I480 Paroxysmal atrial fibrillation: Secondary | ICD-10-CM

## 2024-03-25 DIAGNOSIS — D6869 Other thrombophilia: Secondary | ICD-10-CM

## 2024-03-25 DIAGNOSIS — I4819 Other persistent atrial fibrillation: Secondary | ICD-10-CM

## 2024-03-25 DIAGNOSIS — I359 Nonrheumatic aortic valve disorder, unspecified: Secondary | ICD-10-CM

## 2024-03-25 LAB — POCT INR: INR: 2.8 (ref 2.0–3.0)

## 2024-03-25 NOTE — Patient Instructions (Signed)
 Description   continue taking warfarin 1 tablet daily.   Stay consistent with greens each week (4 per week).  Recheck INR in 5 weeks. Amio stopped   Coumadin  clinic [phone]

## 2024-03-25 NOTE — Progress Notes (Signed)
 Primary Care Physician: Verdia Lombard, MD Primary Cardiologist: Dr Rolan Primary Electrophysiologist: Dr Inocencio Referring Physician: Dr Inocencio [name] is a 53 y.o. male with a history of alcohol abuse, tobacco use, bicuspid AV s/p Bentall and MAZE, systolic CHF, HTN, OSA, atrial flutter, atrial fibrillation who presents for follow up in the [facility]. He was hospitalized 2015 with heart failure and severe aortic insufficiency.  Found to have atrial flutter.  He was found to have a bicuspid aortic valve and ascending aortic aneurysm.  He is status post Bentall procedure with ascending aortic aneurysm and arch replacement with aortic valve replacement March 2015.  He had a maze at the time with left atrial appendage occlusion.  He has had episodes of atypical atrial flutter.  He was initially on amiodarone .   In 2018 he was admitted for lower GI bleed.  This suspected due to hemorrhoids.  2019 his ejection fraction improved to normal.   July 2023, he went back into atrial flutter.  He had a TEE and cardioversion for atypical atrial flutter.  Amiodarone  was resumed. Patient is on warfarin for a CHADS2VASC score of 2. He was seen by Dr Inocencio who recommended ablation.  He was hospitalized again for acute anemia, hgb 6.6. Fecal occult was positive and he received 2 units PRBC. Patient deferred colonoscopy to do as an outpatient.   On follow up today, patient reports that he feels well. He remains in SR. No recent bleeding issues. Patient is scheduled for ablation 08/13/22 with Dr Inocencio.   On follow up 09/18/23, patient is currently in atrial flutter. Review of phone notes show patient was in anticoagulation clinic 08/31/23 and had a supra therapeutic INR of 5.6. He noted that he believes he has been in Afib since November/December 2024 and on his own restarted amiodarone  2 months ago. He began taking amiodarone  200 mg once daily in December and  stopped taking it 2 weeks ago. He felt like it wasn't really helping and that's why he stopped when pharmacist noted his INR level was elevated. He is on coumadin .  On follow up 03/25/24, patient is currently in NSR. S/p Afib/flutter/atrial tach ablation on 12/24/23 by Dr. Inocencio. No episodes of Afib since ablation. He feels much better in normal rhythm. No chest pain or SOB. Leg sites healed without issue. Patient is on coumadin .  Today, he denies symptoms of orthopnea, PND, lower extremity edema, dizziness, presyncope, syncope, snoring, daytime somnolence, bleeding, or neurologic sequela. The patient is tolerating medications without difficulties and is otherwise without complaint today.     Atrial Fibrillation Risk Factors:  he does have symptoms or diagnosis of sleep apnea. he is waiting for CPAP. he does not have a history of rheumatic fever. he does have a history of alcohol use.   he has a BMI of Body mass index is 26.58 kg/m.SABRA Filed Weights   03/25/24 1051  Weight: 79.3 kg      Family History  Problem Relation Age of Onset   Heart failure Father    Coronary artery disease Father        CABG    Atrial Fibrillation Management history:  Previous antiarrhythmic drugs: amiodarone  Previous cardioversions: 2015 x 2, 11/15/21, 02/14/22, 03/10/22 Previous ablations: 12/18/22, 12/24/23 CHADS2VASC score: 2 Anticoagulation history: warfarin   Past Medical History:  Diagnosis Date   Dysrhythmia    ETOH abuse    Heart murmur    Hx of repair of ascending  aorta 09/27/2013   Non-ischemic cardiomyopathy (HCC)    PAF (paroxysmal atrial fibrillation) (HCC)    a. Dx 07/2013, s/p TEE/DCCV.   Paroxysmal atrial flutter (HCC)    a. Dx 07/2013.   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 Bicuspid aortic valve with severe AI, dilated ascending aorta dx 07/2013.   Systolic CHF (HCC)    a. Dx 07/2013: EF 25%, felt likely related to combo of EtOH + tachy-mediated + AI. LHC pending.   Past Surgical History:  Procedure Laterality Date   A-FLUTTER ABLATION N/A 12/24/2023   Procedure: A-FLUTTER ABLATION;  Surgeon: Inocencio Soyla Lunger, MD;  Location: [facility] CV [facility];  Service: Cardiovascular;  Laterality: N/A;   AORTIC VALVE REPLACEMENT  2015   (using a Carbo medics Carbo-Seal Valsalva, valve size 29 mm)   ASCENDING AORTIC ROOT REPLACEMENT N/A 09/27/2013   Procedure: ASCENDING AORTIC ROOT REPLACEMENT;  Surgeon: Sudie VEAR Laine, MD;  Location: [facility] OR;  Service: Open Heart Surgery;  Laterality: N/A;   ATRIAL FIBRILLATION ABLATION N/A 12/18/2022   Procedure: ATRIAL FIBRILLATION ABLATION;  Surgeon: Inocencio Soyla Lunger, MD;  Location: [facility];  Service: Cardiovascular;  Laterality: N/A;   ATRIAL FIBRILLATION ABLATION N/A 12/24/2023   Procedure: ATRIAL FIBRILLATION ABLATION;  Surgeon: Inocencio Soyla Lunger, MD;  Location: [facility];  Service: Cardiovascular;  Laterality: N/A;   BENTALL PROCEDURE N/A 09/27/2013   Procedure: BENTALL PROCEDURE;  Surgeon: Sudie VEAR Laine, MD;  Location[facility] OR;  Service: Open Heart Surgery;  Laterality: N/A;   CARDIAC CATHETERIZATION     CARDIOVERSION N/A 08/01/2013   Procedure: CARDIOVERSION;  Surgeon: Ezra GORMAN Shuck, MD;  Location: [facility] ENDOSCOPY;  Service: Cardiovascular;  Laterality: N/A;   CARDIOVERSION N/A 11/15/2021   Procedure: CARDIOVERSION;  Surgeon: Shuck Ezra GORMAN, MD;  Location: [facility] ENDOSCOPY;  Service: Cardiovascular;  Laterality: N/A;   CARDIOVERSION N/A 02/14/2022   Procedure: CARDIOVERSION;  Surgeon: Shuck Ezra GORMAN, MD;  Location: [facility] ENDOSCOPY;  Service: Cardiovascular;  Laterality: N/A;    CARDIOVERSION N/A 03/10/2022   Procedure: CARDIOVERSION;  Surgeon: Shuck Ezra GORMAN, MD;  Location: [facility] ENDOSCOPY;  Service: Cardiovascular;  Laterality: N/A;   COLONOSCOPY WITH PROPOFOL  Left 10/10/2016   Procedure: COLONOSCOPY WITH PROPOFOL ;  Surgeon: Elsie Cree, MD;  Location: [facility] ENDOSCOPY;  Service: Endoscopy;  Laterality: Left;   INTRAOPERATIVE TRANSESOPHAGEAL ECHOCARDIOGRAM N/A 09/27/2013   Procedure: INTRAOPERATIVE TRANSESOPHAGEAL ECHOCARDIOGRAM;  Surgeon: Sudie VEAR Laine, MD;  Location: [facility] OR;  Service: Open Heart Surgery;  Laterality: N/A;   LEFT AND RIGHT HEART CATHETERIZATION WITH CORONARY ANGIOGRAM N/A 09/05/2013   Procedure: LEFT AND RIGHT HEART CATHETERIZATION WITH CORONARY ANGIOGRAM;  Surgeon: Ezra GORMAN Shuck, MD;  Location: [facility] CATH LAB;  Service: Cardiovascular;  Laterality: N/A;   MAZE N/A 09/27/2013   Procedure: MAZE;  Surgeon: Sudie VEAR Laine, MD;  Location: [facility] OR;  Service: Open Heart Surgery;  Laterality: N/A;   NO PAST SURGERIES     TEE WITHOUT CARDIOVERSION N/A 08/01/2013   Procedure: TRANSESOPHAGEAL ECHOCARDIOGRAM (TEE);  Surgeon: Ezra GORMAN Shuck, MD;  Location: [facility] ENDOSCOPY;  Service: Cardiovascular;  Laterality: N/A;   TEE WITHOUT CARDIOVERSION N/A 11/15/2021   Procedure: TRANSESOPHAGEAL ECHOCARDIOGRAM (TEE);  Surgeon:  Rolan Ezra RAMAN, MD;  Location: [facility] ENDOSCOPY;  Service: Cardiovascular;  Laterality: N/A;   TEE WITHOUT CARDIOVERSION N/A 03/10/2022   Procedure: TRANSESOPHAGEAL ECHOCARDIOGRAM (TEE);  Surgeon: Rolan Ezra RAMAN, MD;  Location: [facility] ENDOSCOPY;  Service: Cardiovascular;  Laterality: N/A;    Current Outpatient Medications  Medication Sig Dispense Refill   aspirin  EC 81 MG tablet Take 1 tablet (81 mg total) by mouth daily. 90 tablet 3   carvedilol  (COREG ) 25 MG tablet Take 1 tablet (25 mg total) by mouth 2 (two) times daily with a meal. 60 tablet 3   Ferrous Gluconate (IRON 27 PO) Take 27 mg by mouth daily.     furosemide  (LASIX ) 20 MG tablet Take 1  tablet (20 mg total) by mouth as needed. 45 tablet 1   potassium chloride  SA (KLOR-CON  M) 20 MEQ tablet Take 1 tablet (20 mEq total) by mouth as needed. 20 tablet 4   spironolactone  (ALDACTONE ) 25 MG tablet TAKE 1 TABLET(25 MG) BY MOUTH DAILY, NEEDS FOLLOW UP APPOINTMENT FOR MORE REFILLS 30 tablet 6   valsartan  (DIOVAN ) 80 MG tablet Take 1 tablet (80 mg total) by mouth 2 (two) times daily. NEEDS FOLLOW UP APPOINTMENT FOR MORE REFILLS 180 tablet 0   warfarin (COUMADIN ) 5 MG tablet TAKE 1/2 TABLET TO 1 TABLET BY MOUTH DAILY AS DIRECTED BY COUMADIN  CLINIC 75 tablet 0   No current facility-administered medications for this encounter.    Allergies  Allergen Reactions   Morphine  And Codeine Nausea Only   ROS- All systems are reviewed and negative except as per the HPI above.  Physical Exam: Vitals:   03/25/24 1051  BP: (!) 126/90  Pulse: 79  Weight: 79.3 kg  Height: 5' 8 (1.727 m)    GEN- The patient is well appearing, alert and oriented x 3 today.   Neck - no JVD or carotid bruit noted Lungs- Clear to ausculation bilaterally, normal work of breathing Heart- Regular rate and rhythm, click noted, no murmurs, rubs or gallops, PMI not laterally displaced Extremities- no clubbing, cyanosis, or edema Skin - no rash or ecchymosis noted   Wt Readings from Last 3 Encounters:  03/25/24 79.3 kg  12/24/23 81.6 kg  10/19/23 82 kg    EKG today demonstrates  Vent. rate 79 BPM PR interval * ms QRS duration 98 ms QT/QTcB 394/451 ms P-R-T axes * 75 243 Normal sinus rhythm ST & T wave abnormality, consider inferolateral ischemia Abnormal ECG When compared with ECG of 24-Dec-2023 15:34, QT interval has improved Confirmed by Terra Pac ([id number]) on 03/25/2024 10:57:02 AM  Echo 05/14/20 demonstrated   1. Distal septal hypokinesis . Left ventricular ejection fraction, by  estimation, is 50 to 55%. The left ventricle has low normal function. The  left ventricle has no regional wall motion  abnormalities. Left ventricular  diastolic parameters were normal.   2. Right ventricular systolic function is normal. The right ventricular  size is normal. There is normal pulmonary artery systolic pressure.   3. Left atrial size was moderately dilated.   4. Right atrial size was moderately dilated.   5. The mitral valve is normal in structure. Trivial mitral valve  regurgitation. No evidence of mitral stenosis.   6. Post Bental with mechanical AVR normal function no PVL/AR mean  gradient stable since previous echo done November 2019 Trivial closing  volume AR. The aortic valve has been repaired/replaced. Aortic valve  regurgitation is trivial. No aortic stenosis is   present. There is a 29 mm Sorin  Carbomedics mechanical valve present in  the aortic position. Procedure Date: 09/27/2013.   7. The inferior vena cava is normal in size with greater than 50%  respiratory variability, suggesting right atrial pressure of 3 mmHg.   Epic records are reviewed at length today  CHA2DS2-VASc Score = 2  The patient's score is based upon: CHF History: 1 HTN History: 1 Diabetes History: 0 Stroke History: 0 Vascular Disease History: 0 Age Score: 0 Gender Score: 0      ASSESSMENT AND PLAN: 1. Persistent Atrial Fibrillation/atrial flutter/atrial tachycardia The patient's CHA2DS2-VASc score is 2, indicating a 2.2% annual risk of stroke.   S/p Afib ablation on 12/18/22 by Dr. Inocencio.  S/p Afib/flutter ablation on 12/24/23 by Dr. Inocencio.  Patient is currently in NSR. Continue coreg  25 mg BID.   2. Secondary Hypercoagulable State (ICD10:  D68.69) The patient is at significant risk for stroke/thromboembolism based upon his CHA2DS2-VASc Score of 2.  Continue Warfarin (Coumadin ).  Continue coumadin  as directed. INR today 2.8.   3. Chronic systolic CHF Euvolemic today.  4. Severe AI Bicuspid AV s/p Bentall. He has a mechanical aortic valve. He is on coumadin .  5. HTN Stable  today.    Follow up with EP as scheduled.    Dorn Heinrich, PA-C Afib Clinic [facility] [address] [place], [place] [address] [phone] 03/25/2024 11:09 AM

## 2024-03-25 NOTE — Progress Notes (Signed)
 INR 2.8; Please see anticoagulation encounter   Description   continue taking warfarin 1 tablet daily.   Stay consistent with greens each week (4 per week).  Recheck INR in 5 weeks. Amio stopped   Coumadin  clinic [phone]

## 2024-04-14 ENCOUNTER — Other Ambulatory Visit (HOSPITAL_COMMUNITY): Payer: Self-pay | Admitting: Internal Medicine

## 2024-04-17 ENCOUNTER — Other Ambulatory Visit: Payer: Self-pay | Admitting: Cardiology

## 2024-04-17 DIAGNOSIS — I480 Paroxysmal atrial fibrillation: Secondary | ICD-10-CM

## 2024-04-17 DIAGNOSIS — Q2381 Bicuspid aortic valve: Secondary | ICD-10-CM

## 2024-05-02 ENCOUNTER — Ambulatory Visit: Payer: PRIVATE HEALTH INSURANCE

## 2024-05-16 ENCOUNTER — Ambulatory Visit: Payer: PRIVATE HEALTH INSURANCE | Attending: Cardiology | Admitting: *Deleted

## 2024-05-16 DIAGNOSIS — I480 Paroxysmal atrial fibrillation: Secondary | ICD-10-CM | POA: Diagnosis not present

## 2024-05-16 DIAGNOSIS — Z954 Presence of other heart-valve replacement: Secondary | ICD-10-CM | POA: Diagnosis not present

## 2024-05-16 DIAGNOSIS — I359 Nonrheumatic aortic valve disorder, unspecified: Secondary | ICD-10-CM | POA: Diagnosis not present

## 2024-05-16 LAB — POCT INR: INR: 4.1 — AB (ref 2.0–3.0)

## 2024-05-16 NOTE — Patient Instructions (Signed)
 Description   INR-4.1; Do not take any warfarin today then continue taking warfarin 1 tablet daily.   Stay consistent with greens each week (4 per week).  Recheck INR in 4 weeks. Amio stopped   Coumadin  clinic [phone]

## 2024-05-16 NOTE — Progress Notes (Signed)
 Description   INR-4.1; Do not take any warfarin today then continue taking warfarin 1 tablet daily.   Stay consistent with greens each week (4 per week).  Recheck INR in 4 weeks. Amio stopped   Coumadin  clinic [phone]

## 2024-05-31 ENCOUNTER — Other Ambulatory Visit (HOSPITAL_COMMUNITY): Payer: Self-pay | Admitting: Cardiology

## 2024-06-13 ENCOUNTER — Ambulatory Visit: Payer: PRIVATE HEALTH INSURANCE

## 2024-06-27 ENCOUNTER — Ambulatory Visit: Payer: PRIVATE HEALTH INSURANCE | Attending: Cardiology

## 2024-07-06 ENCOUNTER — Other Ambulatory Visit: Payer: Self-pay | Admitting: Cardiology

## 2024-07-06 DIAGNOSIS — I480 Paroxysmal atrial fibrillation: Secondary | ICD-10-CM

## 2024-07-06 DIAGNOSIS — Q2381 Bicuspid aortic valve: Secondary | ICD-10-CM

## 2024-07-06 NOTE — Telephone Encounter (Signed)
 Warfarin 5mg  Dx-afib Last INR Check-05/16/24 next appt 07/20/24 Last OV- 03/25/24  30 day supply sent as he is pending an appt

## 2024-07-20 ENCOUNTER — Ambulatory Visit: Payer: PRIVATE HEALTH INSURANCE | Attending: Cardiology | Admitting: *Deleted

## 2024-07-20 DIAGNOSIS — I359 Nonrheumatic aortic valve disorder, unspecified: Secondary | ICD-10-CM

## 2024-07-20 DIAGNOSIS — Z5181 Encounter for therapeutic drug level monitoring: Secondary | ICD-10-CM

## 2024-07-20 DIAGNOSIS — I480 Paroxysmal atrial fibrillation: Secondary | ICD-10-CM

## 2024-07-20 DIAGNOSIS — Z954 Presence of other heart-valve replacement: Secondary | ICD-10-CM

## 2024-07-20 LAB — POCT INR: INR: 2.9 (ref 2.0–3.0)

## 2024-07-20 NOTE — Patient Instructions (Signed)
 Description   INR-2.9; Continue taking warfarin 1 tablet daily.  Stay consistent with greens each week (4 per week).  Recheck INR in 6 weeks. Amio stopped   Coumadin  clinic [phone]

## 2024-07-20 NOTE — Progress Notes (Signed)
 Description   INR-2.9; Continue taking warfarin 1 tablet daily.  Stay consistent with greens each week (4 per week).  Recheck INR in 6 weeks. Amio stopped   Coumadin  clinic [phone]

## 2024-07-29 ENCOUNTER — Other Ambulatory Visit (HOSPITAL_COMMUNITY): Payer: Self-pay | Admitting: Cardiology

## 2024-08-11 ENCOUNTER — Other Ambulatory Visit: Payer: Self-pay | Admitting: Cardiology

## 2024-08-11 DIAGNOSIS — I480 Paroxysmal atrial fibrillation: Secondary | ICD-10-CM

## 2024-08-11 DIAGNOSIS — Q2381 Bicuspid aortic valve: Secondary | ICD-10-CM

## 2024-08-11 MED ORDER — WARFARIN SODIUM 5 MG PO TABS: ORAL_TABLET | ORAL | 0 refills | Status: AC

## 2024-08-11 NOTE — Addendum Note (Signed)
 Addended by: ROYDEN RAKE B on: 08/11/2024 03:03 PM   Modules accepted: Orders

## 2024-08-11 NOTE — Telephone Encounter (Signed)
 Prescription refill request received for warfarin Lov:  Dolph 03/25/2024 Next INR check: 2/9 Warfarin tablet strength: 5mg    Refill sent.

## 2024-08-25 ENCOUNTER — Other Ambulatory Visit: Payer: Self-pay | Admitting: Internal Medicine

## 2024-08-29 ENCOUNTER — Ambulatory Visit: Payer: PRIVATE HEALTH INSURANCE
